# Patient Record
Sex: Female | Born: 1990 | Race: White | Hispanic: No | Marital: Single | Smoking: Never smoker
Health system: Southern US, Community
[De-identification: ages and names within clinical notes are randomized; demographics above are authoritative.]

## PROBLEM LIST (undated history)

## (undated) ENCOUNTER — Inpatient Hospital Stay (HOSPITAL_COMMUNITY): Payer: Self-pay

## (undated) ENCOUNTER — Ambulatory Visit: Admission: EM | Payer: Medicaid Other

## (undated) DIAGNOSIS — E669 Obesity, unspecified: Secondary | ICD-10-CM

## (undated) DIAGNOSIS — F32A Depression, unspecified: Secondary | ICD-10-CM

## (undated) DIAGNOSIS — F419 Anxiety disorder, unspecified: Secondary | ICD-10-CM

## (undated) DIAGNOSIS — I10 Essential (primary) hypertension: Secondary | ICD-10-CM

## (undated) DIAGNOSIS — F909 Attention-deficit hyperactivity disorder, unspecified type: Secondary | ICD-10-CM

## (undated) DIAGNOSIS — A6000 Herpesviral infection of urogenital system, unspecified: Secondary | ICD-10-CM

## (undated) DIAGNOSIS — N72 Inflammatory disease of cervix uteri: Secondary | ICD-10-CM

## (undated) DIAGNOSIS — D696 Thrombocytopenia, unspecified: Secondary | ICD-10-CM

## (undated) DIAGNOSIS — R05 Cough: Secondary | ICD-10-CM

## (undated) DIAGNOSIS — F329 Major depressive disorder, single episode, unspecified: Secondary | ICD-10-CM

## (undated) DIAGNOSIS — M7989 Other specified soft tissue disorders: Secondary | ICD-10-CM

## (undated) DIAGNOSIS — R059 Cough, unspecified: Secondary | ICD-10-CM

## (undated) DIAGNOSIS — D689 Coagulation defect, unspecified: Secondary | ICD-10-CM

## (undated) DIAGNOSIS — D693 Immune thrombocytopenic purpura: Secondary | ICD-10-CM

## (undated) DIAGNOSIS — N809 Endometriosis, unspecified: Secondary | ICD-10-CM

## (undated) HISTORY — DX: Anxiety disorder, unspecified: F41.9

## (undated) HISTORY — DX: Major depressive disorder, single episode, unspecified: F32.9

## (undated) HISTORY — DX: Immune thrombocytopenic purpura: D69.3

## (undated) HISTORY — DX: Coagulation defect, unspecified: D68.9

## (undated) HISTORY — DX: Cough: R05

## (undated) HISTORY — DX: Thrombocytopenia, unspecified: D69.6

## (undated) HISTORY — DX: Depression, unspecified: F32.A

## (undated) HISTORY — DX: Obesity, unspecified: E66.9

## (undated) HISTORY — DX: Essential (primary) hypertension: I10

## (undated) HISTORY — DX: Other specified soft tissue disorders: M79.89

## (undated) HISTORY — DX: Cough, unspecified: R05.9

## (undated) HISTORY — PX: TONSILECTOMY, ADENOIDECTOMY, BILATERAL MYRINGOTOMY AND TUBES: SHX2538

---
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
## 2010-08-04 LAB — URINE MICROSCOPIC-ADD ON

## 2010-08-04 LAB — URINALYSIS, ROUTINE W REFLEX MICROSCOPIC
Bilirubin Urine: NEGATIVE
Glucose, UA: NEGATIVE mg/dL
Ketones, ur: NEGATIVE mg/dL
Nitrite: NEGATIVE
Specific Gravity, Urine: 1.015 (ref 1.005–1.030)
Urobilinogen, UA: 0.2 mg/dL (ref 0.0–1.0)
pH: 6 (ref 5.0–8.0)

## 2010-08-04 LAB — CBC
HCT: 37.4 % (ref 36.0–49.0)
Hemoglobin: 12.8 g/dL (ref 12.0–16.0)
MCHC: 34.1 g/dL (ref 31.0–37.0)
MCV: 81.7 fL (ref 78.0–98.0)
Platelets: 157 10*3/uL (ref 150–400)
RBC: 4.58 MIL/uL (ref 3.80–5.70)
RDW: 13.9 % (ref 11.4–15.5)
WBC: 15.3 10*3/uL — ABNORMAL HIGH (ref 4.5–13.5)

## 2010-08-04 LAB — COMPREHENSIVE METABOLIC PANEL
ALT: 13 U/L (ref 0–35)
AST: 14 U/L (ref 0–37)
Albumin: 3.8 g/dL (ref 3.5–5.2)
Alkaline Phosphatase: 115 U/L (ref 47–119)
BUN: 7 mg/dL (ref 6–23)
CO2: 26 mEq/L (ref 19–32)
Calcium: 8.8 mg/dL (ref 8.4–10.5)
Chloride: 102 mEq/L (ref 96–112)
Creatinine, Ser: 0.84 mg/dL (ref 0.4–1.2)
Glucose, Bld: 125 mg/dL — ABNORMAL HIGH (ref 70–99)
Potassium: 3.9 mEq/L (ref 3.5–5.1)
Sodium: 134 mEq/L — ABNORMAL LOW (ref 135–145)
Total Bilirubin: 0.8 mg/dL (ref 0.3–1.2)
Total Protein: 6.6 g/dL (ref 6.0–8.3)

## 2010-08-04 LAB — URINE CULTURE: Colony Count: 15000

## 2010-08-04 LAB — PREGNANCY, URINE: Preg Test, Ur: NEGATIVE

## 2010-08-04 LAB — DIFFERENTIAL
Basophils Absolute: 0 10*3/uL (ref 0.0–0.1)
Basophils Relative: 0 % (ref 0–1)
Eosinophils Absolute: 0 10*3/uL (ref 0.0–1.2)
Eosinophils Relative: 0 % (ref 0–5)
Lymphocytes Relative: 4 % — ABNORMAL LOW (ref 24–48)
Lymphs Abs: 0.6 10*3/uL — ABNORMAL LOW (ref 1.1–4.8)
Monocytes Absolute: 0.5 10*3/uL (ref 0.2–1.2)
Monocytes Relative: 3 % (ref 3–11)
Neutro Abs: 14.2 10*3/uL — ABNORMAL HIGH (ref 1.7–8.0)
Neutrophils Relative %: 93 % — ABNORMAL HIGH (ref 43–71)
WBC Morphology: INCREASED

## 2010-08-04 LAB — LIPASE, BLOOD: Lipase: 12 U/L (ref 11–59)

## 2011-01-14 LAB — URINALYSIS, ROUTINE W REFLEX MICROSCOPIC
Bilirubin Urine: NEGATIVE
Glucose, UA: NEGATIVE
Hgb urine dipstick: NEGATIVE
Ketones, ur: NEGATIVE
Nitrite: NEGATIVE
Protein, ur: NEGATIVE
Specific Gravity, Urine: 1.025
Urobilinogen, UA: 0.2
pH: 5.5

## 2011-01-14 LAB — FETAL FIBRONECTIN: Fetal Fibronectin: NEGATIVE

## 2011-01-18 LAB — URINALYSIS, ROUTINE W REFLEX MICROSCOPIC
Bilirubin Urine: NEGATIVE
Glucose, UA: NEGATIVE
Hgb urine dipstick: NEGATIVE
Ketones, ur: NEGATIVE
Nitrite: NEGATIVE
Protein, ur: NEGATIVE
Specific Gravity, Urine: 1.02
Urobilinogen, UA: 0.2
pH: 6.5

## 2011-01-19 LAB — CBC
HCT: 22.4 — ABNORMAL LOW
HCT: 34.4 — ABNORMAL LOW
Hemoglobin: 11.9 — ABNORMAL LOW
Hemoglobin: 7.8 — CL
MCHC: 34.8
MCHC: 34.8
MCV: 85.5
MCV: 86.3
Platelets: 102 — ABNORMAL LOW
Platelets: 164
RBC: 2.59 — ABNORMAL LOW
RBC: 4.02
RDW: 14.3
RDW: 14.3
WBC: 10.5
WBC: 9.2

## 2011-01-19 LAB — COMPREHENSIVE METABOLIC PANEL
ALT: 12
AST: 17
Albumin: 2.7 — ABNORMAL LOW
Alkaline Phosphatase: 137 — ABNORMAL HIGH
BUN: 9
CO2: 19
Calcium: 9
Chloride: 109
Creatinine, Ser: 0.72
Glucose, Bld: 118 — ABNORMAL HIGH
Potassium: 4.2
Sodium: 137
Total Bilirubin: 0.2 — ABNORMAL LOW
Total Protein: 5.4 — ABNORMAL LOW

## 2011-01-19 LAB — LACTATE DEHYDROGENASE: LDH: 172

## 2011-01-19 LAB — URIC ACID: Uric Acid, Serum: 7.8 — ABNORMAL HIGH

## 2011-01-19 LAB — PROTEIN, URINE, RANDOM: Total Protein, Urine: 32

## 2011-01-19 LAB — RPR: RPR Ser Ql: NONREACTIVE

## 2011-01-26 LAB — PREGNANCY, URINE: Preg Test, Ur: NEGATIVE

## 2011-01-26 LAB — URINALYSIS, ROUTINE W REFLEX MICROSCOPIC
Bilirubin Urine: NEGATIVE
Glucose, UA: NEGATIVE
Leukocytes, UA: NEGATIVE
Nitrite: NEGATIVE
Specific Gravity, Urine: >1.03 — ABNORMAL HIGH
Urobilinogen, UA: 0.2
pH: 5.5

## 2011-01-26 LAB — URINE CULTURE
Colony Count: NO GROWTH
Culture: NO GROWTH

## 2011-01-26 LAB — URINE MICROSCOPIC-ADD ON

## 2011-07-30 ENCOUNTER — Emergency Department (HOSPITAL_COMMUNITY)
Admission: EM | Admit: 2011-07-30 | Discharge: 2011-07-30 | Disposition: A | Discharge status: Home / Self Care | Payer: BC Managed Care – PPO | Attending: Emergency Medicine | Admitting: Emergency Medicine

## 2011-07-30 ENCOUNTER — Emergency Department (HOSPITAL_COMMUNITY): Payer: BC Managed Care – PPO

## 2011-07-30 ENCOUNTER — Encounter (HOSPITAL_COMMUNITY): Payer: Self-pay

## 2011-07-30 DIAGNOSIS — IMO0001 Reserved for inherently not codable concepts without codable children: Secondary | ICD-10-CM | POA: Insufficient documentation

## 2011-07-30 DIAGNOSIS — J020 Streptococcal pharyngitis: Secondary | ICD-10-CM | POA: Insufficient documentation

## 2011-07-30 DIAGNOSIS — J111 Influenza due to unidentified influenza virus with other respiratory manifestations: Secondary | ICD-10-CM | POA: Insufficient documentation

## 2011-07-30 HISTORY — DX: Attention-deficit hyperactivity disorder, unspecified type: F90.9

## 2011-07-30 MED ORDER — HYDROCODONE-HOMATROPINE 5-1.5 MG/5ML PO SYRP: 5.0000 mL | ORAL_SOLUTION | Freq: Four times a day (QID) | ORAL | Status: AC | PRN

## 2011-07-30 MED ORDER — HYDROCOD POLST-CHLORPHEN POLST 10-8 MG/5ML PO LQCR
5.0000 mL | Freq: Once | ORAL | Status: AC
  Administered 2011-07-30: 5 mL via ORAL
  Filled 2011-07-30: qty 5

## 2011-07-30 MED ORDER — HYDROCOD POLST-CHLORPHEN POLST 10-8 MG/5ML PO LQCR: 5.0000 mL | Freq: Two times a day (BID) | ORAL | Status: DC

## 2011-07-30 NOTE — ED Notes (Signed)
Pt presents with fever, flu and strep throat. Pt states she was treated by PMD but her fever will not go down.

## 2011-07-30 NOTE — ED Notes (Signed)
Patient transported to X-ray 

## 2011-07-30 NOTE — Discharge Instructions (Signed)
Influenza Facts Flu (influenza) is a contagious respiratory illness caused by the influenza viruses. It can cause mild to severe illness. While most healthy people recover from the flu without specific treatment and without complications, older people, young children, and people with certain health conditions are at higher risk for serious complications from the flu, including death. CAUSES   The flu virus is spread from person to person by respiratory droplets from coughing and sneezing.   A person can also become infected by touching an object or surface with a virus on it and then touching their mouth, eye or nose.   Adults may be able to infect others from 1 day before symptoms occur and up to 7 days after getting sick. So it is possible to give someone the flu even before you know you are sick and continue to infect others while you are sick.  SYMPTOMS   Fever (usually high).   Headache.   Tiredness (can be extreme).   Cough.   Sore throat.   Runny or stuffy nose.   Body aches.   Diarrhea and vomiting may also occur, particularly in children.   These symptoms are referred to as "flu-like symptoms". A lot of different illnesses, including the common cold, can have similar symptoms.  DIAGNOSIS   There are tests that can determine if you have the flu as long you are tested within the first 2 or 3 days of illness.   A doctor's exam and additional tests may be needed to identify if you have a disease that is a complicating the flu.  RISKS AND COMPLICATIONS  Some of the complications caused by the flu include:  Bacterial pneumonia or progressive pneumonia caused by the flu virus.   Loss of body fluids (dehydration).   Worsening of chronic medical conditions, such as heart failure, asthma, or diabetes.   Sinus problems and ear infections.  HOME CARE INSTRUCTIONS   Seek medical care early on.   If you are at high risk from complications of the flu, consult your health-care  provider as soon as you develop flu-like symptoms. Those at high risk for complications include:   People 65 years or older.   People with chronic medical conditions, including diabetes.   Pregnant women.   Young children.   Your caregiver may recommend use of an antiviral medication to help treat the flu.   If you get the flu, get plenty of rest, drink a lot of liquids, and avoid using alcohol and tobacco.   You can take over-the-counter medications to relieve the symptoms of the flu if your caregiver approves. (Never give aspirin to children or teenagers who have flu-like symptoms, particularly fever).  PREVENTION  The single best way to prevent the flu is to get a flu vaccine each fall. Other measures that can help protect against the flu are:  Antiviral Medications   A number of antiviral drugs are approved for use in preventing the flu. These are prescription medications, and a doctor should be consulted before they are used.   Habits for Good Health   Cover your nose and mouth with a tissue when you cough or sneeze, throw the tissue away after you use it.   Wash your hands often with soap and water, especially after you cough or sneeze. If you are not near water, use an alcohol-based hand cleaner.   Avoid people who are sick.   If you get the flu, stay home from work or school. Avoid contact with   other people so that you do not make them sick, too.   Try not to touch your eyes, nose, or mouth as germs ore often spread this way.  IN CHILDREN, EMERGENCY WARNING SIGNS THAT NEED URGENT MEDICAL ATTENTION:  Fast breathing or trouble breathing.   Bluish skin color.   Not drinking enough fluids.   Not waking up or not interacting.   Being so irritable that the child does not want to be held.   Flu-like symptoms improve but then return with fever and worse cough.   Fever with a rash.  IN ADULTS, EMERGENCY WARNING SIGNS THAT NEED URGENT MEDICAL ATTENTION:  Difficulty  breathing or shortness of breath.   Pain or pressure in the chest or abdomen.   Sudden dizziness.   Confusion.   Severe or persistent vomiting.  SEEK IMMEDIATE MEDICAL CARE IF:  You or someone you know is experiencing any of the symptoms above. When you arrive at the emergency center,report that you think you have the flu. You may be asked to wear a mask and/or sit in a secluded area to protect others from getting sick. MAKE SURE YOU:   Understand these instructions.   Monitor your condition.   Seek medical care if you are getting worse, or not improving.  Document Released: 04/15/2003 Document Revised: 04/01/2011 Document Reviewed: 01/09/2009 [facility] Patient Information 2012 [place], [place].     Rest and make sure you are drinking plenty of fluids.  Treat her fever with Tylenol or Motrin.  You may use the prescription given for cough relief, this medicine will also cause drowsiness and will help you sleep.  Your chest x-ray is normal.  Expect gradual resolution of your symptoms over the next 3-4 days as most viral infections will last about a week.  Continue taking the antibiotic that was prescribed for you're strep infection.

## 2011-07-31 NOTE — ED Provider Notes (Signed)
History     CSN: [id number]  Arrival date & time 07/30/11  2020   First MD Initiated Contact with Patient 07/30/11 2028      Chief Complaint  Patient presents with  . Fever  . Influenza  . Sore Throat    (Consider location/radiation/quality/duration/timing/severity/associated sxs/prior treatment) HPI Comments: Patient was diagnosed with both strep throat and influenza by her PCP 4 days ago.  She is currently taking amoxicillin for her strep throat.  She continues to have fevers and feels fatigued and so presents for further evaluation.  She has had generalized achiness and fever that has been fluctuating to a high of 102.  She's also had a near constant nonproductive cough which has kept her awake, therefore is having increasing fatigue.  She denies shortness of breath and chest pain. She has been using ibuprofen and Tylenol which gives her a fleeting relief of her symptoms.   Patient is a 21 y.o. female presenting with fever. The history is provided by the patient and a parent.  Fever Primary symptoms of the febrile illness include fever, fatigue, cough and myalgias. Primary symptoms do not include headaches, shortness of breath, abdominal pain, nausea, arthralgias or rash. The current episode started 3 to 5 days ago. This is a new problem.  The myalgias are not associated with weakness.    Past Medical History  Diagnosis Date  . ADHD (attention deficit hyperactivity disorder)     History reviewed. No pertinent past surgical history.  No family history on file.  History  Substance Use Topics  . Smoking status: Never Smoker   . Smokeless tobacco: Not on file  . Alcohol Use: No    OB History    Grav Para Term Preterm Abortions TAB SAB Ect Mult Living                  Review of Systems  Constitutional: Positive for fever, chills and fatigue.  HENT: Positive for sore throat and trouble swallowing. Negative for ear pain, congestion, facial swelling, neck pain and voice  change.   Eyes: Negative.   Respiratory: Positive for cough. Negative for chest tightness and shortness of breath.   Cardiovascular: Negative for chest pain.  Gastrointestinal: Negative for nausea and abdominal pain.  Genitourinary: Negative.   Musculoskeletal: Positive for myalgias. Negative for joint swelling and arthralgias.  Skin: Negative.  Negative for rash and wound.  Neurological: Negative for dizziness, weakness, light-headedness, numbness and headaches.  Hematological: Negative.   Psychiatric/Behavioral: Negative.     Allergies  Review of patient's allergies indicates no known allergies.  Home Medications   Current Outpatient Rx  Name Route Sig Dispense Refill  . HYDROCODONE-HOMATROPINE 5-1.5 MG/5ML PO SYRP Oral Take 5 mLs by mouth every 6 (six) hours as needed for cough. 100 mL 0    BP 146/91  Pulse 100  Temp(Src) 98.6 F (37 C) (Oral)  Resp 18  Ht 5\' 4"  (1.626 m)  Wt 250 lb (113.399 kg)  BMI 42.91 kg/m2  SpO2 96%  Physical Exam  Nursing note and vitals reviewed. Constitutional: She is oriented to person, place, and time. She appears well-developed and well-nourished.  HENT:  Head: Normocephalic and atraumatic.  Right Ear: External ear normal.  Left Ear: External ear normal.  Nose: Nose normal.  Mouth/Throat: Uvula is midline and mucous membranes are normal. Posterior oropharyngeal erythema present. No oropharyngeal exudate, posterior oropharyngeal edema or tonsillar abscesses.  Eyes: Conjunctivae are normal.  Neck: Normal range of motion. No thyromegaly present.  Cardiovascular: Normal rate, regular rhythm, normal heart sounds and intact distal pulses.   Pulmonary/Chest: Effort normal and breath sounds normal. No respiratory distress. She has no decreased breath sounds. She has no wheezes. She has no rhonchi. She has no rales.  Abdominal: Soft. Bowel sounds are normal. There is no tenderness.  Musculoskeletal: Normal range of motion.  Lymphadenopathy:     She has cervical adenopathy.       Right cervical: Superficial cervical adenopathy present.       Left cervical: Superficial cervical adenopathy present.  Neurological: She is alert and oriented to person, place, and time.  Skin: Skin is warm and dry. No rash noted.  Psychiatric: She has a normal mood and affect.    ED Course  Procedures (including critical care time)  Labs Reviewed - No data to display Dg Chest 2 View  07/30/2011  *RADIOLOGY REPORT*  Clinical Data: Cough, fever, strep throat  CHEST - 2 VIEW  Comparison: 02/27/2008  Findings: Lungs are clear. No pleural effusion or pneumothorax.  Cardiomediastinal silhouette is within normal limits.  Visualized osseous structures are within normal limits.  IMPRESSION: Normal chest radiographs.  Original Report Authenticated By: Charline Bills, M.D.     1. Strep throat   2. Influenza       MDM  A chest x-ray today reviewed prior to discharge.  Reassurance given patient that her symptoms are most consistent with her known strep infection plus influenza.  Encouraged rest, fluids.  Prescribed Hycodan so patient can get better rest by suppressing her cough better.  Expect gradual improvement.        Candis Musa, PA 07/31/11 [phone]

## 2011-07-31 NOTE — ED Provider Notes (Signed)
Medical screening examination/treatment/procedure(s) were performed by non-physician practitioner and as supervising physician I was immediately available for consultation/collaboration.   Dione Booze, MD 07/31/11 2337

## 2011-09-18 ENCOUNTER — Emergency Department (HOSPITAL_COMMUNITY)
Admission: EM | Admit: 2011-09-18 | Discharge: 2011-09-18 | Disposition: A | Discharge status: Home / Self Care | Payer: BC Managed Care – PPO | Attending: Emergency Medicine | Admitting: Emergency Medicine

## 2011-09-18 ENCOUNTER — Emergency Department (HOSPITAL_COMMUNITY): Payer: BC Managed Care – PPO

## 2011-09-18 ENCOUNTER — Encounter (HOSPITAL_COMMUNITY): Payer: Self-pay | Admitting: *Deleted

## 2011-09-18 DIAGNOSIS — R071 Chest pain on breathing: Secondary | ICD-10-CM | POA: Insufficient documentation

## 2011-09-18 DIAGNOSIS — R05 Cough: Secondary | ICD-10-CM | POA: Insufficient documentation

## 2011-09-18 DIAGNOSIS — F909 Attention-deficit hyperactivity disorder, unspecified type: Secondary | ICD-10-CM | POA: Insufficient documentation

## 2011-09-18 DIAGNOSIS — R0789 Other chest pain: Secondary | ICD-10-CM

## 2011-09-18 DIAGNOSIS — R0989 Other specified symptoms and signs involving the circulatory and respiratory systems: Secondary | ICD-10-CM | POA: Insufficient documentation

## 2011-09-18 DIAGNOSIS — Z79899 Other long term (current) drug therapy: Secondary | ICD-10-CM | POA: Insufficient documentation

## 2011-09-18 DIAGNOSIS — J3489 Other specified disorders of nose and nasal sinuses: Secondary | ICD-10-CM | POA: Insufficient documentation

## 2011-09-18 DIAGNOSIS — M549 Dorsalgia, unspecified: Secondary | ICD-10-CM | POA: Insufficient documentation

## 2011-09-18 DIAGNOSIS — R059 Cough, unspecified: Secondary | ICD-10-CM | POA: Insufficient documentation

## 2011-09-18 LAB — D-DIMER, QUANTITATIVE: D-Dimer, Quant: 0.4 ug/mL-FEU (ref 0.00–0.48)

## 2011-09-18 MED ORDER — OXYCODONE-ACETAMINOPHEN 5-325 MG PO TABS: 1.0000 | ORAL_TABLET | ORAL | Status: AC | PRN

## 2011-09-18 MED ORDER — IBUPROFEN 800 MG PO TABS
800.0000 mg | ORAL_TABLET | Freq: Once | ORAL | Status: AC
  Administered 2011-09-18: 800 mg via ORAL
  Filled 2011-09-18: qty 1

## 2011-09-18 MED ORDER — OXYCODONE-ACETAMINOPHEN 5-325 MG PO TABS
1.0000 | ORAL_TABLET | Freq: Once | ORAL | Status: AC
  Administered 2011-09-18: 1 via ORAL
  Filled 2011-09-18: qty 1

## 2011-09-18 MED ORDER — IBUPROFEN 600 MG PO TABS: 600.0000 mg | ORAL_TABLET | Freq: Four times a day (QID) | ORAL | Status: AC | PRN

## 2011-09-18 NOTE — Discharge Instructions (Signed)
Chest Wall Pain Chest wall pain is pain in or around the bones and muscles of your chest. It may take up to 6 weeks to get better. It may take longer if you must stay physically active in your work and activities.  CAUSES  Chest wall pain may happen on its own. However, it may be caused by:  A viral illness like the flu.   Injury.   Coughing.   Exercise.   Arthritis.   Fibromyalgia.   Shingles.  HOME CARE INSTRUCTIONS   Avoid overtiring physical activity. Try not to strain or perform activities that cause pain. This includes any activities using your chest or your abdominal and side muscles, especially if heavy weights are used.   Put ice on the sore area.   Put ice in a plastic bag.   Place a towel between your skin and the bag.   Leave the ice on for 15 to 20 minutes per hour while awake for the first 2 days.   Only take over-the-counter or prescription medicines for pain, discomfort, or fever as directed by your caregiver.  SEEK IMMEDIATE MEDICAL CARE IF:   Your pain increases, or you are very uncomfortable.   You have a fever.   Your chest pain becomes worse.   You have new, unexplained symptoms.   You have nausea or vomiting.   You feel sweaty or lightheaded.   You have a cough with phlegm (sputum), or you cough up blood.  MAKE SURE YOU:   Understand these instructions.   Will watch your condition.   Will get help right away if you are not doing well or get worse.  Document Released: 04/12/2005 Document Revised: 04/01/2011 Document Reviewed: 12/07/2010 [facility] Patient Information 2012 [place], [place].    You may use the medicines prescribed for your pain.  Do not drive within 4 hours of taking oxycodone as this medication will make you drowsy.  A  heating pad applied to your chest for 20 minutes3-4 times daily may help this heal quicker.  Please see your Dr. for recheck if you are not improving over the next week.

## 2011-09-18 NOTE — ED Notes (Signed)
Pt c/o cough and congestion. Also c/o chest pain that is worse with coughing and deep breathing.

## 2011-09-20 ENCOUNTER — Encounter (HOSPITAL_COMMUNITY): Payer: Self-pay | Admitting: Emergency Medicine

## 2011-09-20 NOTE — ED Provider Notes (Signed)
History     CSN: [id number]  Arrival date & time 09/18/11  1858   First MD Initiated Contact with Patient 09/18/11 1927      Chief Complaint  Patient presents with  . Chest Pain    (Consider location/radiation/quality/duration/timing/severity/associated sxs/prior treatment) HPI Comments: Janice Taylor presents for evaluation of chest and upper back pain which has started over the last 24 hours and has been persistent.  She describes sharp stabbing pain which is worse with coughing and deep inspiration and palpation.  She has had a nonproductive cough for the past week and developed gradually worsening pain in her left upper chest wall which radiates into her left upper back for the past 24 hours.  She denies shortness of breath, weakness or dizziness, denies fevers or chills.  She has had mild intermittent nasal congestion without sore throat or postnasal drip.  She also denies nausea or vomiting, no abdominal painand has noted no swelling in her  extremities.  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  Patient is a 21 y.o. female presenting with chest pain. The history is provided by the patient and a parent.  Chest Pain Primary symptoms include cough. Pertinent negatives for primary symptoms include no fever, no shortness of breath, no palpitations, no abdominal pain, no nausea and no dizziness.  Pertinent negatives for associated symptoms include no numbness and no weakness.     Past Medical History  Diagnosis Date  . ADHD (attention deficit hyperactivity disorder)     History reviewed. No pertinent past surgical history.  History reviewed. No pertinent family history.  History  Substance Use Topics  . Smoking status: Never Smoker   . Smokeless tobacco: Not on file  . Alcohol Use: No    OB History    Grav Para Term Preterm Abortions TAB SAB Ect Mult Living                  Review of  Systems  Constitutional: Negative for fever.  HENT: Negative for congestion, sore throat and neck pain.   Eyes: Negative.   Respiratory: Positive for cough. Negative for chest tightness and shortness of breath.   Cardiovascular: Positive for chest pain. Negative for palpitations and leg swelling.  Gastrointestinal: Negative for nausea and abdominal pain.  Genitourinary: Negative.   Musculoskeletal: Negative for joint swelling and arthralgias.  Skin: Negative.  Negative for rash and wound.  Neurological: Negative for dizziness, weakness, light-headedness, numbness and headaches.  Hematological: Negative.   Psychiatric/Behavioral: Negative.     Allergies  Review of patient's allergies indicates no known allergies.  Home Medications   Current Outpatient Rx  Name Route Sig Dispense Refill  . LISDEXAMFETAMINE DIMESYLATE 70 MG PO CAPS Oral Take 70 mg by mouth daily.    . IBUPROFEN 600 MG PO TABS Oral Take 1 tablet (600 mg total) by mouth every 6 (six) hours as needed for pain. 20 tablet 0  . OXYCODONE-ACETAMINOPHEN 5-325 MG PO TABS Oral Take 1 tablet by mouth every 4 (four) hours as needed for pain. 15 tablet 0    BP 105/67  Pulse 76  Temp(Src) 97.7 F (36.5 C) (Oral)  Resp 20  Ht 5\' 4"  (1.626 m)  Wt 250 lb (113.399 kg)  BMI 42.91 kg/m2  SpO2 98%  Physical Exam  Nursing note and vitals reviewed. Constitutional: She appears well-developed and well-nourished. No distress.  HENT:  Head: Normocephalic and atraumatic.  Neck: Normal range of motion. Neck supple.  Cardiovascular: Normal rate, regular rhythm, normal heart sounds and intact distal pulses.   Pulses:      Radial pulses are 2+ on the right side, and 2+ on the left side.       Dorsalis pedis pulses are 2+ on the right side, and 2+ on the left side.       No peripheral edema appreciated  Pulmonary/Chest: Effort normal and breath sounds normal. She has no wheezes. She has no rales.   She exhibits tenderness.     Abdominal: Soft. Bowel sounds are normal. There is no tenderness.  Musculoskeletal: Normal range of motion.  Neurological: She is alert.  Skin: Skin is warm, dry and intact. No rash noted.  Psychiatric: She has a normal mood and affect.    ED Course  Procedures (including critical care time)   Labs Reviewed  D-DIMER, QUANTITATIVE  LAB REPORT - SCANNED   Dg Chest 2 View  09/18/2011  *RADIOLOGY REPORT*  Clinical Data: Cough.  Pleuritic chest pain.  Chest congestion.  CHEST - 2 VIEW  Comparison:  07/30/2011  Findings:  The heart size and mediastinal contours are within normal limits.  Both lungs are clear.  The visualized skeletal structures are unremarkable.  IMPRESSION: No active cardiopulmonary disease.  Original Report Authenticated By: Danae Orleans, M.D.     1. Chest wall pain     Patient was given an oxycodone tablet and ibuprofen 800 mg and she did obtain significant relief of her symptoms.  Recheck of her vital signs prior to discharge normal with no tachycardia.  MDM  Labs and x-ray reviewed.  Patient is a 21 year old female who is her negative within normal range d-dimer test today and reproducible chest pain consistent with chest wall pain. Doubt PE.  Was prescribed ibuprofen and Percocet for symptom relief and for cough relief as well.  She was also encouraged to use a heating pad to her chest and back several times daily.  Recheck by PCP if not improving over the next week.        Burgess Amor, [place] 09/20/11 [phone]

## 2011-09-23 NOTE — ED Provider Notes (Signed)
Medical screening examination/treatment/procedure(s) were performed by non-physician practitioner and as supervising physician I was immediately available for consultation/collaboration.   Wilmon Conover M Jasdeep Kepner, MD 09/23/11 0053 

## 2011-11-23 ENCOUNTER — Other Ambulatory Visit (HOSPITAL_COMMUNITY): Payer: Self-pay | Admitting: Oncology

## 2011-11-23 ENCOUNTER — Encounter (HOSPITAL_COMMUNITY): Payer: BC Managed Care – PPO | Attending: Oncology | Admitting: Oncology

## 2011-11-23 ENCOUNTER — Encounter (HOSPITAL_COMMUNITY): Payer: Self-pay | Admitting: Oncology

## 2011-11-23 ENCOUNTER — Encounter (HOSPITAL_BASED_OUTPATIENT_CLINIC_OR_DEPARTMENT_OTHER): Payer: BC Managed Care – PPO

## 2011-11-23 VITALS — BP 167/92 | HR 100 | Temp 98.5°F | Ht 64.0 in | Wt 308.1 lb

## 2011-11-23 DIAGNOSIS — D696 Thrombocytopenia, unspecified: Secondary | ICD-10-CM

## 2011-11-23 DIAGNOSIS — E669 Obesity, unspecified: Secondary | ICD-10-CM | POA: Insufficient documentation

## 2011-11-23 DIAGNOSIS — D693 Immune thrombocytopenic purpura: Secondary | ICD-10-CM

## 2011-11-23 DIAGNOSIS — Z6841 Body Mass Index (BMI) 40.0 and over, adult: Secondary | ICD-10-CM | POA: Insufficient documentation

## 2011-11-23 LAB — DIFFERENTIAL
Basophils Absolute: 0 10*3/uL (ref 0.0–0.1)
Basophils Relative: 0 % (ref 0–1)
Eosinophils Absolute: 0.1 10*3/uL (ref 0.0–0.7)
Eosinophils Relative: 1 % (ref 0–5)
Lymphocytes Relative: 36 % (ref 12–46)
Lymphs Abs: 2.5 10*3/uL (ref 0.7–4.0)
Monocytes Absolute: 0.5 10*3/uL (ref 0.1–1.0)
Monocytes Relative: 7 % (ref 3–12)
Neutro Abs: 3.8 10*3/uL (ref 1.7–7.7)
Neutrophils Relative %: 56 % (ref 43–77)

## 2011-11-23 LAB — COMPREHENSIVE METABOLIC PANEL
ALT: 27 U/L (ref 0–35)
AST: 25 U/L (ref 0–37)
Albumin: 3.8 g/dL (ref 3.5–5.2)
Alkaline Phosphatase: 82 U/L (ref 39–117)
BUN: 9 mg/dL (ref 6–23)
CO2: 24 mEq/L (ref 19–32)
Calcium: 9.3 mg/dL (ref 8.4–10.5)
Chloride: 104 mEq/L (ref 96–112)
Creatinine, Ser: 0.84 mg/dL (ref 0.50–1.10)
GFR calc Af Amer: >90 mL/min (ref >90)
GFR calc non Af Amer: >90 mL/min (ref >90)
Glucose, Bld: 97 mg/dL (ref 70–99)
Potassium: 3.6 mEq/L (ref 3.5–5.1)
Sodium: 136 mEq/L (ref 135–145)
Total Bilirubin: 0.4 mg/dL (ref 0.3–1.2)
Total Protein: 6.7 g/dL (ref 6.0–8.3)

## 2011-11-23 LAB — APTT: aPTT: 24 seconds (ref 24–37)

## 2011-11-23 LAB — IRON AND TIBC
Iron: 62 ug/dL (ref 42–135)
Saturation Ratios: 21 % (ref 20–55)
TIBC: 300 ug/dL (ref 250–470)
UIBC: 238 ug/dL (ref 125–400)

## 2011-11-23 LAB — SEDIMENTATION RATE: Sed Rate: 34 mm/hr — ABNORMAL HIGH (ref 0–22)

## 2011-11-23 LAB — CBC
HCT: 39.3 % (ref 36.0–46.0)
Hemoglobin: 13.4 g/dL (ref 12.0–15.0)
MCH: 29.7 pg (ref 26.0–34.0)
MCHC: 34.1 g/dL (ref 30.0–36.0)
MCV: 87.1 fL (ref 78.0–100.0)
Platelets: <5 10*3/uL — CL (ref 150–400)
RBC: 4.51 MIL/uL (ref 3.87–5.11)
RDW: 13.3 % (ref 11.5–15.5)
WBC: 6.9 10*3/uL (ref 4.0–10.5)

## 2011-11-23 LAB — FOLATE: Folate: 13.9 ng/mL

## 2011-11-23 LAB — PROTIME-INR
INR: 0.94 (ref 0.00–1.49)
Prothrombin Time: 12.8 seconds (ref 11.6–15.2)

## 2011-11-23 LAB — VITAMIN B12: Vitamin B-12: 482 pg/mL (ref 211–911)

## 2011-11-23 LAB — RHEUMATOID FACTOR: Rhuematoid fact SerPl-aCnc: <10 IU/mL (ref <=14)

## 2011-11-23 LAB — FERRITIN: Ferritin: 91 ng/mL (ref 10–291)

## 2011-11-23 NOTE — Patient Instructions (Signed)
Janice Taylor  [id number] December 24, 1990 Dr. Glenford Peers  [facility]  Discharge Instructions  RECOMMENDATIONS MADE BY THE CONSULTANT AND ANY TEST RESULTS WILL BE SENT TO YOUR REFERRING DOCTOR.   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 This is why you have the bruises.  MEDICATIONS PRESCRIBED: We will treat you with steroids. You are to START Dexamethasone TODAY. You will take Dexamethasone 4mg  tabs/ 5 tablets two times a day for 4 days. You will have 1 refill called to [facility]. In 28 days (on August 26) you will START this same medicine again with same directions for 4 days.   SPECIAL INSTRUCTIONS/FOLLOW-UP: We have done numerous blood test today. We will call you with any unexpected findings. We will have you come every Monday for lab work.  IT IS VERY IMPORTANT FOR YOU NOT TO DRIVE UNLESS ABSOLUTELY NECESSARY. DO NOT ENGAGE IN ANY ACTIVITY THAT YOU COULD POSSIBLY INJURE OR CUT YOURSELF. DO NOT SHAVE, BE CAREFUL USING KNIVES WHILE PREPARING FOODS, USE A SOFT TOOTH BRUSH.  IF YOU SHOULD HAVE ANY INJURY THAT CAUSES BLEEDING YOU ARE TO GO TO THE NEAREST EMERGENCY ROOM IMMEDIATELY!    I acknowledge that I have been informed and understand all the instructions given to me and received a copy. I do not have any more questions at this time, but understand that I may call the [facility] at [facility] at [phone] during business hours should I have any further questions or need assistance in obtaining follow-up care.    __________________________________________  _____________  __________ Signature of Patient or Authorized Representative            Date                   Time    __________________________________________ Nurse's Signature

## 2011-11-23 NOTE — Progress Notes (Signed)
Labs drawn today for cbc/diff,cmp,pt/ptt,ana,rf,foalte,vb12,folate,Iron and IBC

## 2011-11-23 NOTE — Progress Notes (Signed)
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 She has not had a recent viral infection though she has had a chronic cough for the last 3-5 months. She produces clear phlegm only. She gets hot at times she states but no distinct fever or shaking chills or chest pain diarrhea etc. his present. She has not had muscle aches and joint aches. She has not had night sweats. She is not losing weight nor has lost her appetite. She is not aware of adenopathy. Her grandmother accompanied her today. She has had a T. and a without bleeding and she has had tubes placed in her years when she was a child without bleeding but she did have one pregnancy labor and delivery at age 39 with what she describes as lots of blood loss until she was given shots and that stop the bleeding. She was not given blood transfusions that she is aware of. She was never told that her platelets were low in the past.  She does not smoke. She does not drink. She denies use of illicit drugs. She has been section active with 5 people and has not used protection. She presently has a Mirena device in place x2 years.  She has never been tested for HIV that she is aware of. Her exam shows vital signs to be recorded she weighs 308 pounds on a 5 foot 4 inch frame. She is afebrile. She has a pulse of 100 and regular. Respirations 1822 and unlabored at rest. She thinks she is wheezing but I did not hear any wheezes whatsoever. She had no rhonchi no rubs no rales. Her heart exam showed no murmur rub or gallop. Breast exam is negative for masses. Her skin exam showed numerous ecchymoses some as large as 4-5 cm some slightly larger but she had numerous petechiae on her abdomen she had a few on her right forearm 1 or 2 on her right foot 2  or 3 on her left anterior shin. She had ecchymoses on her forearms specimen the right. They were in various stages of development. The most obvious place for the petechiae  was her abdomen. She had no palatal petechiae no conjunctival petechiae she was alert and oriented, denied headaches EOMs were intact, facial symmetry was intact. Tongue is normal in the midline and there were no intraoral blood blisters or petechiae. Her abdomen is obese without obvious organomegaly bowel sounds were diminished. She had pulses which were 1-2+ in her feet she is right handed and there was no edema of her arms or legs. She follow all commands clearly. She was both anxious and seemed very angry and upset. She would talk about why this happened her etc.  I have looked at her peripheral blood smear which shows essentially absence of platelets but normal appearance of red cells and white cells.  Her presentation is most consistent with ITP. I have asked her to stop her doxycycline which was prescribed yesterday since I don't think she has an active bronchitis. She has not been on her via a Faylene Million for 2 or 3 months she has not been on her Ambien for 2 or 3 months and she takes her Celexa rarely. She has been on Tussionex intermittently for  the length of  time that she has had her cough and that has been about 6 or 7 bottles of this medication..  We will start her on pulse Decadron 20 mg twice a day for 4 days and repeat that in 28 days, check her blood counts weekly. I do not want her working presently nor riding in a car unless it is absolutely necessary she needs to come to the emergency room if things change and active bleeding occurs, headaches, seizures, etc. she may need a CAT scan at some point in the near future but I think we need to do the bloodwork that we started out with today and the pulse Decadron and see her back in a couple weeks. I have tried to explain this disorder to her carefully and also to her grandmother.  I have offered her second opinion at one of the [facility] if she so desires.

## 2011-11-23 NOTE — Progress Notes (Signed)
I will keep her PCP informed of our findings.

## 2011-11-24 LAB — ANA: Anti Nuclear Antibody(ANA): NEGATIVE

## 2011-11-24 LAB — HIV ANTIBODY (ROUTINE TESTING W REFLEX): HIV: NONREACTIVE

## 2011-11-25 ENCOUNTER — Telehealth (HOSPITAL_COMMUNITY): Payer: Self-pay

## 2011-11-25 ENCOUNTER — Other Ambulatory Visit (HOSPITAL_COMMUNITY): Payer: Self-pay | Admitting: Oncology

## 2011-11-25 ENCOUNTER — Encounter (HOSPITAL_COMMUNITY): Payer: Self-pay | Admitting: Oncology

## 2011-11-25 DIAGNOSIS — D696 Thrombocytopenia, unspecified: Secondary | ICD-10-CM

## 2011-11-25 DIAGNOSIS — Z862 Personal history of diseases of the blood and blood-forming organs and certain disorders involving the immune mechanism: Secondary | ICD-10-CM | POA: Insufficient documentation

## 2011-11-25 DIAGNOSIS — D693 Immune thrombocytopenic purpura: Secondary | ICD-10-CM

## 2011-11-25 HISTORY — DX: Thrombocytopenia, unspecified: D69.6

## 2011-11-25 HISTORY — DX: Immune thrombocytopenic purpura: D69.3

## 2011-11-25 LAB — HEPATITIS PANEL, ACUTE
HCV Ab: NEGATIVE
Hep A IgM: NEGATIVE
Hep B C IgM: NEGATIVE
Hepatitis B Surface Ag: NEGATIVE

## 2011-11-25 LAB — H. PYLORI ANTIBODY, IGG: H Pylori IgG: 0.46 {ISR}

## 2011-11-25 MED ORDER — ONDANSETRON HCL 8 MG PO TABS: 8.0000 mg | ORAL_TABLET | Freq: Three times a day (TID) | ORAL | Status: AC | PRN

## 2011-11-29 ENCOUNTER — Encounter (HOSPITAL_COMMUNITY): Payer: BC Managed Care – PPO | Attending: Oncology

## 2011-11-29 DIAGNOSIS — D696 Thrombocytopenia, unspecified: Secondary | ICD-10-CM | POA: Insufficient documentation

## 2011-11-29 DIAGNOSIS — D693 Immune thrombocytopenic purpura: Secondary | ICD-10-CM | POA: Insufficient documentation

## 2011-11-29 LAB — CBC
HCT: 43.4 % (ref 36.0–46.0)
Hemoglobin: 14.9 g/dL (ref 12.0–15.0)
MCH: 30 pg (ref 26.0–34.0)
MCHC: 34.3 g/dL (ref 30.0–36.0)
MCV: 87.5 fL (ref 78.0–100.0)
Platelets: 188 10*3/uL (ref 150–400)
RBC: 4.96 MIL/uL (ref 3.87–5.11)
RDW: 13.7 % (ref 11.5–15.5)
WBC: 14.5 10*3/uL — ABNORMAL HIGH (ref 4.0–10.5)

## 2011-11-29 NOTE — Progress Notes (Signed)
Labs drawn today for cbc 

## 2011-12-06 ENCOUNTER — Encounter (HOSPITAL_BASED_OUTPATIENT_CLINIC_OR_DEPARTMENT_OTHER): Payer: BC Managed Care – PPO

## 2011-12-06 DIAGNOSIS — D693 Immune thrombocytopenic purpura: Secondary | ICD-10-CM

## 2011-12-06 LAB — CBC
HCT: 41 % (ref 36.0–46.0)
Hemoglobin: 13.5 g/dL (ref 12.0–15.0)
MCH: 29.3 pg (ref 26.0–34.0)
MCHC: 32.9 g/dL (ref 30.0–36.0)
MCV: 89.1 fL (ref 78.0–100.0)
Platelets: 119 10*3/uL — ABNORMAL LOW (ref 150–400)
RBC: 4.6 MIL/uL (ref 3.87–5.11)
RDW: 13.3 % (ref 11.5–15.5)
WBC: 9.4 10*3/uL (ref 4.0–10.5)

## 2011-12-06 NOTE — Progress Notes (Signed)
Labs drawn today for cbc 

## 2011-12-13 ENCOUNTER — Encounter (HOSPITAL_BASED_OUTPATIENT_CLINIC_OR_DEPARTMENT_OTHER): Payer: BC Managed Care – PPO

## 2011-12-13 DIAGNOSIS — D693 Immune thrombocytopenic purpura: Secondary | ICD-10-CM

## 2011-12-13 LAB — CBC
HCT: 38.5 % (ref 36.0–46.0)
Hemoglobin: 12.8 g/dL (ref 12.0–15.0)
MCH: 29.5 pg (ref 26.0–34.0)
MCHC: 33.2 g/dL (ref 30.0–36.0)
MCV: 88.7 fL (ref 78.0–100.0)
Platelets: 63 10*3/uL — ABNORMAL LOW (ref 150–400)
RBC: 4.34 MIL/uL (ref 3.87–5.11)
RDW: 13.3 % (ref 11.5–15.5)
WBC: 6.1 10*3/uL (ref 4.0–10.5)

## 2011-12-13 NOTE — Progress Notes (Signed)
Labs drawn today for cbc 

## 2011-12-14 ENCOUNTER — Ambulatory Visit (HOSPITAL_COMMUNITY): Payer: BC Managed Care – PPO | Admitting: Oncology

## 2011-12-14 ENCOUNTER — Encounter (HOSPITAL_BASED_OUTPATIENT_CLINIC_OR_DEPARTMENT_OTHER): Payer: BC Managed Care – PPO | Admitting: Oncology

## 2011-12-14 VITALS — BP 142/86 | HR 94 | Temp 98.3°F | Resp 20 | Wt 311.8 lb

## 2011-12-14 DIAGNOSIS — E669 Obesity, unspecified: Secondary | ICD-10-CM

## 2011-12-14 DIAGNOSIS — Z6841 Body Mass Index (BMI) 40.0 and over, adult: Secondary | ICD-10-CM

## 2011-12-14 DIAGNOSIS — D473 Essential (hemorrhagic) thrombocythemia: Secondary | ICD-10-CM

## 2011-12-14 DIAGNOSIS — D696 Thrombocytopenia, unspecified: Secondary | ICD-10-CM

## 2011-12-14 MED ORDER — DEXAMETHASONE 4 MG PO TABS: 20.0000 mg | ORAL_TABLET | Freq: Two times a day (BID) | ORAL | Status: DC

## 2011-12-14 MED ORDER — ALPRAZOLAM 0.5 MG PO TABS: ORAL_TABLET | ORAL | Status: DC

## 2011-12-14 NOTE — Patient Instructions (Addendum)
[facility]  Discharge Instructions Janice Taylor  DOB 25-Dec-1990 CSN [id number]  MRN [id number]   RECOMMENDATIONS MADE BY THE CONSULTANT AND ANY TEST RESULTS WILL BE SENT TO YOUR REFERRING DOCTOR.   EXAM FINDINGS BY MD TODAY AND SIGNS AND SYMPTOMS TO REPORT TO CLINIC OR PRIMARY MD: Your disease is ITP- your immune system attacks your own platelets. Decadron suppresses immune system and the platelets build up. When you stop the decadron they start to fall. The spleen is the organ that attacks platelets. Sometimes the decadron stops working and we have to get spleen removed. Dr. Lovell Sheehan would be the surgeon we would use.  MEDICATIONS PRESCRIBED: start decadron next Tuesday, Tom called in.    SPECIAL INSTRUCTIONS/FOLLOW-UP:  We need to do CT scan of abdomen to check size of spleen.  I acknowledge that I have been informed and understand all the instructions given to me and received a copy. I do not have any more questions at this time, but understand that I may call the Specialty Clinic at [facility] at [phone] during business hours should I have any further questions or need assistance in obtaining follow-up care.    __________________________________________  _____________  __________ Signature of Patient or Authorized Representative            Date                   Time    __________________________________________ Nurse's Signature

## 2011-12-14 NOTE — Progress Notes (Signed)
No primary provider on file. No primary provider on file.  1. Thrombocytopenia  dexamethasone (DECADRON) 4 MG tablet, diazepam (VALIUM) 10 MG tablet, ondansetron (ZOFRAN) 8 MG tablet, CT Abdomen Wo Contrast, CBC    CURRENT THERAPY:Pulse Decadron 20 mg BID x 4 days every 28 days.  She is due to start her next pulse on 12/21/2011.  This was started on 11/23/2011 with a nice response in her platelets  INTERVAL HISTORY: Janine Ores 21 y.o. female returns for  regular  visit for followup of ITP.  She was started on pulse decadron 20 mg BID x 4 days every 28 days on 11/23/2011 at time of presentation with a nice response in her platelet count from <5 to 188.  Unfortunately, the patient's platelet count has declined from 188 to 119 to 63 most recently on 12/13/2011.  I personally reviewed and went over laboratory results with the patient.  I printed a copy of her lab work and provided it to her today.  I gave the patient some education regarding ITP.  Camella is in a much better mood today than she was on her last encounter at the [facility].  She apologized for her behavior and reports that she was scared since she was at a [employer]."  She participated in conversation appropriately.  She asked a few questions regarding the possibility of this being a life-long disorder and requiring lifelong treatment.  I informed her that it could be a lifelong issue or a transient illness requiring close follow-up.  We also discussed the next step if steroid fail which would be a splenectomy.  She has no preference of surgeon, but thinks she would like to use Dr. Lovell Sheehan (General Surgeon) because she has heard of him. We will also perform a CT of abdomen to evaluate for splenomegaly.     She will start her Decadron pulse next week , 12/21/2011 as scheduled.  She was told that her platelet count was safe.  She reports that she gets very anxious before lab work and has a phobia of blood.  Therefore, I will  give her an Rx for Xanax 0.5 mg #10 with 1 refill.  She will take this the AM before lab work only and she reports that she will have a driver so she is not driving while on the medication.  She brought up the medication valium which I have declined at this time.    Past Medical History  Diagnosis Date  . ADHD (attention deficit hyperactivity disorder)   . Depression   . Anxiety   . Thrombocytopenia 11/25/2011    Consistent with ITP    has Thrombocytopenia on her problem list.      has no known allergies.  Ms. Chichester does not currently have medications on file.  Past Surgical History  Procedure Date  . Intrauterine device insertion   . Tonsilectomy, adenoidectomy, bilateral myringotomy and tubes     Denies any headaches, dizziness, double vision, fevers, chills, night sweats, nausea, vomiting, diarrhea, constipation, chest pain, heart palpitations, shortness of breath, blood in stool, black tarry stool, urinary pain, urinary burning, urinary frequency, hematuria, rash.   PHYSICAL EXAMINATION  ECOG PERFORMANCE STATUS: 0 - Asymptomatic  Filed Vitals:   12/14/11 1128  BP: 142/86  Pulse: 94  Temp: 98.3 F (36.8 C)  Resp: 20    GENERAL:alert, no distress, well nourished, well developed, comfortable, cooperative, obese, smiling and cooperative and pleasant today SKIN: skin color, texture, turgor are normal,  no rashes or significant lesions HEAD: Normocephalic, No masses, lesions, tenderness or abnormalities EYES: normal, Conjunctiva are pink and non-injected EARS: External ears normal OROPHARYNX:lips, buccal mucosa, and tongue normal, mucous membranes are moist and no petechial rash  NECK: supple, trachea midline, thick neck LYMPH:  Difficult to assess for splenomegaly due to body habitus but I believe I palpated an enlarged spleen approximately 4 cm below the costophrenic margin. BREAST:not examined LUNGS: clear to auscultation and percussion HEART: regular rate & rhythm,  no murmurs, no gallops, S1 normal and S2 normal ABDOMEN:abdomen soft, non-tender, obese and normal bowel sounds BACK: Back symmetric, no curvature., no rash, skin stretch marks appreciated EXTREMITIES:less then 2 second capillary refill, no joint deformities, effusion, or inflammation, no skin discoloration, no clubbing, no cyanosis, positive findings:  edema 1+ LE pitting edema.  No petechial rashes appreciated  NEURO: alert & oriented x 3 with fluent speech, no focal motor/sensory deficits, gait normal    LABORATORY DATA: Results for GERIANN, LAFONT (MRN [id number]) as of 12/14/2011 11:14  Ref. Range 11/23/2011 10:34 11/23/2011 10:50 11/29/2011 11:10 12/06/2011 11:37 12/13/2011 12:25  WBC Latest Range: 4.0-10.5 K/uL 6.9  14.5 (H) 9.4 6.1  RBC Latest Range: 3.87-5.11 MIL/uL 4.51  4.96 4.60 4.34  Hemoglobin Latest Range: 12.0-15.0 g/dL 40.9  81.1 91.4 78.2  HCT Latest Range: 36.0-46.0 % 39.3  43.4 41.0 38.5  MCV Latest Range: 78.0-100.0 fL 87.1  87.5 89.1 88.7  MCH Latest Range: 26.0-34.0 pg 29.7  30.0 29.3 29.5  MCHC Latest Range: 30.0-36.0 g/dL 95.6  21.3 08.6 57.8  RDW Latest Range: 11.5-15.5 % 13.3  13.7 13.3 13.3  Platelets Latest Range: 150-400 K/uL <5 (LL)  188 119 (L) 63 (L)     ASSESSMENT:  1. Severe thrombocytopenia thus far consistent with ITP 2. Obesity with a BMI of 53   PLAN:  1. I personally reviewed and went over laboratory results with the patient. 2. CT abd without contrast to evaluate for splenomegaly. 3. Weekly lab work: CBC  4. Continue with pulse Decadron 20 mg BID x 4 days every 28 days.  She is due to start her next pulse on 12/21/2011. 5. Patient education regarding ITP, etiology, and pathophysiology. 6. Patient educated about the next line of treatment is a splenectomy.  She prefers Dr. Lovell Sheehan as a Careers adviser.  7. Will call in Xanax 0.5 mg #10 and she may take 1 tablet PO the AM before lab work. 8. Will also call in Decadron pulse regimen with 2 refills. 9. Return  in 4 weeks for follow-up.   All questions were answered. The patient knows to call the clinic with any problems, questions or concerns. We can certainly see the patient much sooner if necessary.  The patient and plan discussed with Glenford Peers, MD and he is in agreement with the aforementioned.  More than 50% of the time spent with the patient was utilized for counseling and coordination of care.  I spent 20 minutes counseling the patient face to face. The total time spent in the appointment was 25 minutes.  KEFALAS,THOMAS

## 2011-12-15 ENCOUNTER — Ambulatory Visit (HOSPITAL_COMMUNITY): Payer: BC Managed Care – PPO | Admitting: Oncology

## 2011-12-15 ENCOUNTER — Ambulatory Visit (HOSPITAL_COMMUNITY)
Admission: RE | Admit: 2011-12-15 | Discharge: 2011-12-15 | Disposition: A | Discharge status: Home / Self Care | Payer: BC Managed Care – PPO | Source: Ambulatory Visit | Attending: Oncology | Admitting: Oncology

## 2011-12-15 DIAGNOSIS — R161 Splenomegaly, not elsewhere classified: Secondary | ICD-10-CM | POA: Insufficient documentation

## 2011-12-15 DIAGNOSIS — D696 Thrombocytopenia, unspecified: Secondary | ICD-10-CM

## 2011-12-20 ENCOUNTER — Encounter (HOSPITAL_BASED_OUTPATIENT_CLINIC_OR_DEPARTMENT_OTHER): Payer: BC Managed Care – PPO

## 2011-12-20 DIAGNOSIS — D696 Thrombocytopenia, unspecified: Secondary | ICD-10-CM

## 2011-12-20 LAB — CBC
HCT: 38.6 % (ref 36.0–46.0)
Hemoglobin: 13.2 g/dL (ref 12.0–15.0)
MCH: 30.3 pg (ref 26.0–34.0)
MCHC: 34.2 g/dL (ref 30.0–36.0)
MCV: 88.5 fL (ref 78.0–100.0)
Platelets: 105 10*3/uL — ABNORMAL LOW (ref 150–400)
RBC: 4.36 MIL/uL (ref 3.87–5.11)
RDW: 13.4 % (ref 11.5–15.5)
WBC: 6.5 10*3/uL (ref 4.0–10.5)

## 2011-12-20 NOTE — Progress Notes (Signed)
Labs drawn today for cbc 

## 2011-12-28 ENCOUNTER — Encounter (HOSPITAL_COMMUNITY): Payer: BC Managed Care – PPO | Attending: Oncology

## 2011-12-28 DIAGNOSIS — D696 Thrombocytopenia, unspecified: Secondary | ICD-10-CM

## 2011-12-28 DIAGNOSIS — D473 Essential (hemorrhagic) thrombocythemia: Secondary | ICD-10-CM

## 2011-12-28 DIAGNOSIS — Z0000 Encounter for general adult medical examination without abnormal findings: Secondary | ICD-10-CM | POA: Insufficient documentation

## 2011-12-28 DIAGNOSIS — D693 Immune thrombocytopenic purpura: Secondary | ICD-10-CM | POA: Insufficient documentation

## 2011-12-28 LAB — CBC
HCT: 43.1 % (ref 36.0–46.0)
Hemoglobin: 14.8 g/dL (ref 12.0–15.0)
MCH: 30 pg (ref 26.0–34.0)
MCHC: 34.3 g/dL (ref 30.0–36.0)
MCV: 87.4 fL (ref 78.0–100.0)
Platelets: 220 10*3/uL (ref 150–400)
RBC: 4.93 MIL/uL (ref 3.87–5.11)
RDW: 13 % (ref 11.5–15.5)
WBC: 14 10*3/uL — ABNORMAL HIGH (ref 4.0–10.5)

## 2011-12-28 NOTE — Progress Notes (Signed)
Labs drawn today for cbc 

## 2012-01-03 ENCOUNTER — Other Ambulatory Visit (HOSPITAL_COMMUNITY): Payer: BC Managed Care – PPO

## 2012-01-10 ENCOUNTER — Other Ambulatory Visit (HOSPITAL_COMMUNITY): Payer: Self-pay | Admitting: Oncology

## 2012-01-10 ENCOUNTER — Telehealth (HOSPITAL_COMMUNITY): Payer: Self-pay | Admitting: *Deleted

## 2012-01-10 ENCOUNTER — Encounter (HOSPITAL_COMMUNITY): Payer: BC Managed Care – PPO

## 2012-01-10 DIAGNOSIS — D696 Thrombocytopenia, unspecified: Secondary | ICD-10-CM

## 2012-01-10 DIAGNOSIS — R059 Cough, unspecified: Secondary | ICD-10-CM

## 2012-01-10 DIAGNOSIS — R05 Cough: Secondary | ICD-10-CM

## 2012-01-10 DIAGNOSIS — R058 Other specified cough: Secondary | ICD-10-CM

## 2012-01-10 DIAGNOSIS — D693 Immune thrombocytopenic purpura: Secondary | ICD-10-CM

## 2012-01-10 LAB — CBC
HCT: 39.2 % (ref 36.0–46.0)
Hemoglobin: 13.3 g/dL (ref 12.0–15.0)
MCH: 30 pg (ref 26.0–34.0)
MCHC: 33.9 g/dL (ref 30.0–36.0)
MCV: 88.3 fL (ref 78.0–100.0)
Platelets: 114 10*3/uL — ABNORMAL LOW (ref 150–400)
RBC: 4.44 MIL/uL (ref 3.87–5.11)
RDW: 13.4 % (ref 11.5–15.5)
WBC: 7.1 10*3/uL (ref 4.0–10.5)

## 2012-01-10 MED ORDER — HYDROCOD POLST-CHLORPHEN POLST 10-8 MG/5ML PO LQCR: 5.0000 mL | Freq: Two times a day (BID) | ORAL | Status: DC

## 2012-01-10 MED ORDER — AZITHROMYCIN 250 MG PO TABS: ORAL_TABLET | ORAL | Status: DC

## 2012-01-10 NOTE — Progress Notes (Signed)
Labs drawn today for cbc 

## 2012-01-10 NOTE — Telephone Encounter (Signed)
Pt notified that platelets are 114000 and that it will be up to her dentist if he wants to do the dental work. Labs have been faxed to Dr. Jearl Klinefelter per T. Jacalyn Lefevre.

## 2012-01-13 ENCOUNTER — Emergency Department (HOSPITAL_COMMUNITY)
Admission: EM | Admit: 2012-01-13 | Discharge: 2012-01-14 | Disposition: A | Discharge status: Home / Self Care | Payer: BC Managed Care – PPO | Attending: Emergency Medicine | Admitting: Emergency Medicine

## 2012-01-13 ENCOUNTER — Emergency Department (HOSPITAL_COMMUNITY): Payer: BC Managed Care – PPO

## 2012-01-13 ENCOUNTER — Encounter (HOSPITAL_COMMUNITY): Payer: Self-pay | Admitting: *Deleted

## 2012-01-13 DIAGNOSIS — Z79899 Other long term (current) drug therapy: Secondary | ICD-10-CM | POA: Insufficient documentation

## 2012-01-13 DIAGNOSIS — R0602 Shortness of breath: Secondary | ICD-10-CM | POA: Insufficient documentation

## 2012-01-13 DIAGNOSIS — R000 Tachycardia, unspecified: Secondary | ICD-10-CM | POA: Insufficient documentation

## 2012-01-13 DIAGNOSIS — M7989 Other specified soft tissue disorders: Secondary | ICD-10-CM | POA: Insufficient documentation

## 2012-01-13 DIAGNOSIS — R609 Edema, unspecified: Secondary | ICD-10-CM | POA: Insufficient documentation

## 2012-01-13 LAB — CBC WITH DIFFERENTIAL/PLATELET
Basophils Absolute: 0 10*3/uL (ref 0.0–0.1)
Basophils Relative: 0 % (ref 0–1)
Eosinophils Absolute: 0.1 10*3/uL (ref 0.0–0.7)
Eosinophils Relative: 1 % (ref 0–5)
HCT: 38.1 % (ref 36.0–46.0)
Hemoglobin: 12.8 g/dL (ref 12.0–15.0)
Lymphocytes Relative: 31 % (ref 12–46)
Lymphs Abs: 2.2 10*3/uL (ref 0.7–4.0)
MCH: 30 pg (ref 26.0–34.0)
MCHC: 33.6 g/dL (ref 30.0–36.0)
MCV: 89.4 fL (ref 78.0–100.0)
Monocytes Absolute: 0.5 10*3/uL (ref 0.1–1.0)
Monocytes Relative: 6 % (ref 3–12)
Neutro Abs: 4.4 10*3/uL (ref 1.7–7.7)
Neutrophils Relative %: 62 % (ref 43–77)
Platelets: 128 10*3/uL — ABNORMAL LOW (ref 150–400)
RBC: 4.26 MIL/uL (ref 3.87–5.11)
RDW: 13.6 % (ref 11.5–15.5)
WBC: 7.1 10*3/uL (ref 4.0–10.5)

## 2012-01-13 LAB — BASIC METABOLIC PANEL
BUN: 9 mg/dL (ref 6–23)
CO2: 22 mEq/L (ref 19–32)
Calcium: 8.8 mg/dL (ref 8.4–10.5)
Chloride: 103 mEq/L (ref 96–112)
Creatinine, Ser: 0.89 mg/dL (ref 0.50–1.10)
GFR calc Af Amer: >90 mL/min (ref >90)
GFR calc non Af Amer: >90 mL/min (ref >90)
Glucose, Bld: 129 mg/dL — ABNORMAL HIGH (ref 70–99)
Potassium: 4.1 mEq/L (ref 3.5–5.1)
Sodium: 136 mEq/L (ref 135–145)

## 2012-01-13 LAB — APTT: aPTT: 26 seconds (ref 24–37)

## 2012-01-13 LAB — PROTIME-INR
INR: 1.1 (ref 0.00–1.49)
Prothrombin Time: 14.1 seconds (ref 11.6–15.2)

## 2012-01-13 LAB — D-DIMER, QUANTITATIVE: D-Dimer, Quant: 0.72 ug/mL-FEU — ABNORMAL HIGH (ref 0.00–0.48)

## 2012-01-13 MED ORDER — HYDROCODONE-ACETAMINOPHEN 5-325 MG PO TABS
1.0000 | ORAL_TABLET | Freq: Once | ORAL | Status: AC
  Administered 2012-01-13: 1 via ORAL
  Filled 2012-01-13: qty 1

## 2012-01-13 MED ORDER — SODIUM CHLORIDE 0.9 % IV BOLUS (SEPSIS)
1000.0000 mL | Freq: Once | INTRAVENOUS | Status: AC
  Administered 2012-01-14: 1000 mL via INTRAVENOUS

## 2012-01-13 NOTE — ED Notes (Signed)
Bilateral lower extremity swelling noted +1 pitting, pt states that her feet are very painful.

## 2012-01-13 NOTE — ED Notes (Signed)
bil leg and feet swelling since yesterday,  Thinks platelets are low.  Has large bruise to rt lower leg.Is seen at [facility] for low platelets.

## 2012-01-13 NOTE — ED Provider Notes (Signed)
History     CSN: [id number]  Arrival date & time 01/13/12  2021   First MD Initiated Contact with Patient 01/13/12 2042      Chief Complaint  Patient presents with  . Leg Swelling    (Consider location/radiation/quality/duration/timing/severity/associated sxs/prior treatment) HPI PT recently diagnosed with ITP, finished steroids a few weeks has had good response with PLT >100 the last few weeks, last checked 3 days ago at the [facility]. Pt reports she has had swelling of her bilateral lower legs since yesterday, complaining of moderate aching pain. Associated with a spontaneous bruise to R inner leg. Denies any CP, SOB. No recent travel or history of hormone treatments. She has been taking z-pak this week for URI symptoms.  Past Medical History  Diagnosis Date  . ADHD (attention deficit hyperactivity disorder)   . Depression   . Anxiety   . Thrombocytopenia 11/25/2011    Consistent with ITP    Past Surgical History  Procedure Date  . Intrauterine device insertion   . Tonsilectomy, adenoidectomy, bilateral myringotomy and tubes     Family History  Problem Relation Age of Onset  . Diabetes Maternal Uncle   . Kidney disease Maternal Uncle     renal failure/dialysis  . Heart attack Maternal Grandmother     History  Substance Use Topics  . Smoking status: Never Smoker   . Smokeless tobacco: Never Used  . Alcohol Use: No    OB History    Grav Para Term Preterm Abortions TAB SAB Ect Mult Living                  Review of Systems All other systems reviewed and are negative except as noted in HPI.   Allergies  Review of patient's allergies indicates no known allergies.  Home Medications   Current Outpatient Rx  Name Route Sig Dispense Refill  . ALPRAZOLAM 0.5 MG PO TABS  Take 1 tablet, PO the AM before lab work 10 tablet 1  . AZITHROMYCIN 250 MG PO TABS  Take as directed 6 each 0  . HYDROCOD POLST-CPM POLST ER 10-8 MG/5ML PO LQCR Oral Take 5 mLs by mouth  every 12 (twelve) hours. 100 mL 0  . DEXAMETHASONE 4 MG PO TABS Oral Take 5 tablets (20 mg total) by mouth 2 (two) times daily with a meal. 40 tablet 2  . DIAZEPAM 10 MG PO TABS      . DIPHENHYDRAMINE HCL 25 MG PO CAPS Oral Take 25 mg by mouth at bedtime as needed.    Marland Kitchen LISDEXAMFETAMINE DIMESYLATE 70 MG PO CAPS Oral Take 70 mg by mouth daily.    Marland Kitchen ONDANSETRON HCL 8 MG PO TABS        BP 155/120  Pulse 117  Temp 98.9 F (37.2 C) (Oral)  Resp 20  Ht 5\' 5"  (1.651 m)  Wt 275 lb (124.739 kg)  BMI 45.76 kg/m2  SpO2 99%  Physical Exam  Nursing note and vitals reviewed. Constitutional: She is oriented to person, place, and time. She appears well-developed and well-nourished.  HENT:  Head: Normocephalic and atraumatic.  Eyes: EOM are normal. Pupils are equal, round, and reactive to light.  Neck: Normal range of motion. Neck supple.  Cardiovascular: Normal rate, normal heart sounds and intact distal pulses.   Pulmonary/Chest: Effort normal and breath sounds normal.  Abdominal: Bowel sounds are normal. She exhibits no distension. There is no tenderness.  Musculoskeletal: Normal range of motion. She exhibits edema (LE swelling bilaterally  to knees, 4cm x 5cm bruise to R inner leg and 1cm x 2cm bruise to L inner leg). She exhibits no tenderness.  Neurological: She is alert and oriented to person, place, and time. She has normal strength. No cranial nerve deficit or sensory deficit.  Skin: Skin is warm and dry. No rash noted.  Psychiatric: She has a normal mood and affect.    ED Course  Procedures (including critical care time)  Labs Reviewed  CBC WITH DIFFERENTIAL - Abnormal; Notable for the following:    Platelets 128 (*)     All other components within normal limits  D-DIMER, QUANTITATIVE - Abnormal; Notable for the following:    D-Dimer, Quant 0.72 (*)     All other components within normal limits  PROTIME-INR  BASIC METABOLIC PANEL  APTT   No results found.   No diagnosis  found.    MDM  PT with no significant thrombocytopenia today. Concern for possible DVT but [place] not available. She has persistent tachycardia but no complaints of CP or SOB. Will send for CTA chest. If neg can come back tomorrow for doppler. Care signed out to Dr. Rhunette Croft.         Charles B. Bernette Mayers, MD 01/13/12 2205

## 2012-01-14 ENCOUNTER — Ambulatory Visit (HOSPITAL_COMMUNITY)
Admit: 2012-01-14 | Discharge: 2012-01-14 | Disposition: A | Discharge status: Home / Self Care | Payer: BC Managed Care – PPO | Attending: Emergency Medicine | Admitting: Emergency Medicine

## 2012-01-14 MED ORDER — PROMETHAZINE HCL 25 MG/ML IJ SOLN
INTRAMUSCULAR | Status: AC
  Administered 2012-01-14: 12.5 mg via INTRAVENOUS
  Filled 2012-01-14: qty 1

## 2012-01-14 MED ORDER — IOHEXOL 350 MG/ML SOLN
120.0000 mL | Freq: Once | INTRAVENOUS | Status: AC | PRN
  Administered 2012-01-14: 120 mL via INTRAVENOUS

## 2012-01-14 MED ORDER — OXYCODONE-ACETAMINOPHEN 5-325 MG PO TABS: ORAL_TABLET | ORAL | Status: DC

## 2012-01-14 MED ORDER — PROMETHAZINE HCL 25 MG/ML IJ SOLN
12.5000 mg | Freq: Once | INTRAMUSCULAR | Status: AC
  Administered 2012-01-14: 12.5 mg via INTRAVENOUS

## 2012-01-14 NOTE — ED Provider Notes (Signed)
Received pt at shift change.  21yo F, c/o bilat LE's edema since yesterday assoc with pain.  Plts per baseline.  CTA chest without PE.  Pt requesting "some pain meds" and wants to go home now.  Tachycardia improved with IVF.  Sats remain 98-100% R/A, resps easy.  Ambulates with steady gait.  Will return this morning for [place] bilat LE's to r/o DVT.  Dx testing d/w pt and family.  Questions answered.  Verb understanding, agreeable to d/c home with outpt f/u.   Laray Anger, DO 01/15/12 1906

## 2012-01-17 ENCOUNTER — Encounter (HOSPITAL_COMMUNITY): Payer: BC Managed Care – PPO

## 2012-01-17 DIAGNOSIS — D696 Thrombocytopenia, unspecified: Secondary | ICD-10-CM

## 2012-01-17 NOTE — Progress Notes (Signed)
Attempted lab draw x 1 w/o success to left AC.  Patient refused additional attempts to patient's hands - offered for another RN to attempt or for her to go to the lab for a phlebotomist to draw labs today, however patient refused.  Patient was rescheduled for tomorrow morning.

## 2012-01-18 ENCOUNTER — Other Ambulatory Visit (HOSPITAL_COMMUNITY): Payer: BC Managed Care – PPO

## 2012-01-18 ENCOUNTER — Encounter (HOSPITAL_BASED_OUTPATIENT_CLINIC_OR_DEPARTMENT_OTHER): Payer: BC Managed Care – PPO | Admitting: Oncology

## 2012-01-18 VITALS — BP 171/94 | HR 106 | Temp 98.0°F | Resp 20 | Wt 313.8 lb

## 2012-01-18 DIAGNOSIS — E669 Obesity, unspecified: Secondary | ICD-10-CM

## 2012-01-18 DIAGNOSIS — D693 Immune thrombocytopenic purpura: Secondary | ICD-10-CM

## 2012-01-18 NOTE — Progress Notes (Signed)
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 Problem #2 obesity  She has responded to dexamethasone but I suspect that the temporary response. Her platelets rise to normal but are already starting to diminished. In the emergency room last week when she was seen for a ecchymosis in her right leg that she was very concerned about her platelets were already down to 128,000. CT angio was done to rule out pulmonary embolus which was absolutely negative.  Her father is with her today and asked a host of questions which hopefully had been satisfactorily answered. I went over her entire workup with him and the patient which was quite extensive but negative for etiology.  She has no petechiae on her legs but she does have one ecchymosis at about 4 cm across very superficially located right upper leg medially which is already starting to resolve. She has not been bleeding from her nose gums etc.  I discussed the fact that she has had 2 episodes of pulse Decadron therapy. We need to see what her platelets to going forward for the next several weeks but I suspect she will fail and need splenectomy. She is not inclined to take vaccinations at this time which include pneumonia vaccine, Haemophilus influenza -type B. vaccine or the meningococcal vaccine. I told her the alternative is to take penicillin twice a day the rest of her life prophylactically.  She is actually attended to talk with Dr. Biagio Quint and would like to see him for possible bariatric surgery and she is wondering whether she could have her spleen removed and have the above surgery at the same time if the spleen needs to be removed. I told her that that would be up to Dr. Biagio Quint.  She wondered when we are going to treat her with steroids again but that would be in anticipation of splenectomy with or without IVIG in addition to the steroids. We will consult Dr. Biagio Quint check  her labs today and she will let us know about the vaccines. We'll check weekly CBCs and bring her back here in 2 weeks

## 2012-01-18 NOTE — Patient Instructions (Addendum)
[facility]  Discharge Instructions  RECOMMENDATIONS MADE BY THE CONSULTANT AND ANY TEST RESULTS WILL BE SENT TO YOUR REFERRING DOCTOR.   EXAM FINDINGS BY MD TODAY AND SIGNS AND SYMPTOMS TO REPORT TO CLINIC OR PRIMARY MD: Discussion per Dr. Mariel Sleet.  We need to check blood work weekly and will get a consult with Dr. Biagio Quint for possible bariatric surgery.  MEDICATIONS PRESCRIBED: none   INSTRUCTIONS GIVEN AND DISCUSSED:  ITP - Idiopathic Thrombocytopenic Purpura. Check on the [employer] website.    SPECIAL INSTRUCTIONS/FOLLOW-UP: Lab work Needed weekly and Return to Clinic to see PA in 2 weeks.   I acknowledge that I have been informed and understand all the instructions given to me and received a copy. I do not have any more questions at this time, but understand that I may call the Specialty Clinic at [facility] at [phone] during business hours should I have any further questions or need assistance in obtaining follow-up care.    __________________________________________  _____________  __________ Signature of Patient or Authorized Representative            Date                   Time    __________________________________________ Nurse's Signature

## 2012-01-21 ENCOUNTER — Encounter (INDEPENDENT_AMBULATORY_CARE_PROVIDER_SITE_OTHER): Payer: Self-pay | Admitting: General Surgery

## 2012-01-21 ENCOUNTER — Other Ambulatory Visit (HOSPITAL_COMMUNITY): Payer: BC Managed Care – PPO

## 2012-01-21 ENCOUNTER — Encounter (HOSPITAL_BASED_OUTPATIENT_CLINIC_OR_DEPARTMENT_OTHER): Payer: BC Managed Care – PPO

## 2012-01-21 ENCOUNTER — Other Ambulatory Visit (HOSPITAL_COMMUNITY): Payer: Self-pay | Admitting: Oncology

## 2012-01-21 ENCOUNTER — Ambulatory Visit (INDEPENDENT_AMBULATORY_CARE_PROVIDER_SITE_OTHER): Payer: BC Managed Care – PPO | Admitting: General Surgery

## 2012-01-21 VITALS — BP 126/72 | HR 70 | Temp 97.8°F | Resp 16 | Ht 65.0 in | Wt 319.4 lb

## 2012-01-21 DIAGNOSIS — D693 Immune thrombocytopenic purpura: Secondary | ICD-10-CM

## 2012-01-21 DIAGNOSIS — Z0000 Encounter for general adult medical examination without abnormal findings: Secondary | ICD-10-CM

## 2012-01-21 DIAGNOSIS — D696 Thrombocytopenia, unspecified: Secondary | ICD-10-CM

## 2012-01-21 DIAGNOSIS — Z23 Encounter for immunization: Secondary | ICD-10-CM

## 2012-01-21 LAB — CBC WITH DIFFERENTIAL/PLATELET
Basophils Absolute: 0 10*3/uL (ref 0.0–0.1)
Basophils Relative: 0 % (ref 0–1)
Eosinophils Absolute: 0.1 10*3/uL (ref 0.0–0.7)
Eosinophils Relative: 2 % (ref 0–5)
HCT: 39.2 % (ref 36.0–46.0)
Hemoglobin: 13.1 g/dL (ref 12.0–15.0)
Lymphocytes Relative: 36 % (ref 12–46)
Lymphs Abs: 1.5 10*3/uL (ref 0.7–4.0)
MCH: 30 pg (ref 26.0–34.0)
MCHC: 33.4 g/dL (ref 30.0–36.0)
MCV: 89.7 fL (ref 78.0–100.0)
Monocytes Absolute: 0.3 10*3/uL (ref 0.1–1.0)
Monocytes Relative: 6 % (ref 3–12)
Neutro Abs: 2.3 10*3/uL (ref 1.7–7.7)
Neutrophils Relative %: 56 % (ref 43–77)
Platelets: 105 10*3/uL — ABNORMAL LOW (ref 150–400)
RBC: 4.37 MIL/uL (ref 3.87–5.11)
RDW: 13.4 % (ref 11.5–15.5)
WBC: 4.2 10*3/uL (ref 4.0–10.5)

## 2012-01-21 MED ORDER — MENINGOCOCCAL VAC A,C,Y,W-135 ~~LOC~~ INJ: 0.5000 mL | INJECTION | Freq: Once | SUBCUTANEOUS | Status: DC

## 2012-01-21 MED ORDER — PNEUMOCOCCAL VAC POLYVALENT 25 MCG/0.5ML IJ INJ: 0.5000 mL | INJECTION | Freq: Once | INTRAMUSCULAR | Status: DC

## 2012-01-21 MED ORDER — PNEUMOCOCCAL VAC POLYVALENT 25 MCG/0.5ML IJ INJ
0.5000 mL | INJECTION | Freq: Once | INTRAMUSCULAR | Status: AC
  Administered 2012-01-21: 0.5 mL via INTRAMUSCULAR
  Filled 2012-01-21: qty 0.5

## 2012-01-21 MED ORDER — HAEMOPHILUS B POLYSAC CONJ VAC IM SOLR: 0.5000 mL | Freq: Once | INTRAMUSCULAR | Status: DC

## 2012-01-21 MED ORDER — HAEMOPHILUS B POLYSAC CONJ VAC IM SOLR
0.5000 mL | Freq: Once | INTRAMUSCULAR | Status: AC
  Administered 2012-01-21: 0.5 mL via INTRAMUSCULAR
  Filled 2012-01-21: qty 0.5

## 2012-01-21 NOTE — Progress Notes (Signed)
Labs drawn today for cbc/diff,vit d

## 2012-01-21 NOTE — Progress Notes (Signed)
Patient ID: Janice Taylor, female   DOB: 1991/04/08, 21 y.o.   MRN: [id number]  Chief Complaint  Patient presents with  . New Evaluation    Possible spleenectomy    HPI Janice Taylor is a 21 y.o. female.  This patient is referred by Dr. 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 This workup began in July when she says that she was at the beach and she started noticing bruising for no apparent reason. She had no other evidence of bleeding and went to have this evaluated. She was found to have essentially no platelets her CBC and she was referred to hematology for evaluation. She says that she does have easy spontaneous bruising but no other evidence of bleeding. She did have a vaginal delivery about 4 years ago which had some heavy bleeding associated with this. She also has had some swelling in both feet and legs and has had 2 rounds of steroid treatment with high-dose steroids for 4 days every 28 days apart. Her last steroid pulse was about 3-4 weeks ago. Each time, she has responded well but upon review of her platelets they really had not been less than 100 except for on one or 2 occasions.  She has also attended our bariatric surgery weight loss information session and was considering gastric bypass surgery as well for a BMI of 58. HPI  Past Medical History  Diagnosis Date  . ADHD (attention deficit hyperactivity disorder)   . Depression   . Anxiety   . 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 Clotting disorder   . Cough   . Leg swelling     Past Surgical History  Procedure Date  . Intrauterine device insertion   . Tonsilectomy, adenoidectomy, bilateral myringotomy and tubes     Family History  Problem Relation Age of Onset  . Diabetes Maternal Uncle   . Kidney disease Maternal Uncle     renal failure/dialysis  . Heart attack Maternal Grandmother     Social History History  Substance Use Topics  . Smoking status: Never Smoker   .  Smokeless tobacco: Never Used  . Alcohol Use: No    No Known Allergies  Current Outpatient Prescriptions  Medication Sig Dispense Refill  . ALPRAZolam (XANAX) 0.5 MG tablet Take 0.5 mg by mouth daily as needed. anxiety      . dexamethasone (DECADRON) 4 MG tablet Take as instructed by Dr.Neijstrom      . diphenhydrAMINE (BENADRYL) 25 mg capsule Take 25 mg by mouth at bedtime as needed.      . furosemide (LASIX) 20 MG tablet Take 20 mg by mouth daily.       . ondansetron (ZOFRAN) 8 MG tablet       . oxyCODONE-acetaminophen (PERCOCET/ROXICET) 5-325 MG per tablet 1 or 2 tabs PO q6h prn pain  20 tablet  0   No current facility-administered medications for this visit.   Facility-Administered Medications Ordered in Other Visits  Medication Dose Route Frequency Provider Last Rate Last Dose  . haemophilus B polysaccharide conjugate vaccine (ActHIB) injection 0.5 mL  0.5 mL Intramuscular Once Ellouise Newer, PA   0.5 mL at 01/21/12 1247  . pneumococcal 23 valent vaccine (PNU-IMMUNE) injection 0.5 mL  0.5 mL Intramuscular Once Ellouise Newer, PA   0.5 mL at 01/21/12 1239  . DISCONTD: haemophilus B polysaccharide conjugate vaccine (ActHIB) injection 0.5 mL  0.5 mL Intramuscular Once Ellouise Newer, PA      .  DISCONTD: meningococcal vaccine (MENOMUNE) injection 0.5 mL  0.5 mL Subcutaneous Once Ellouise Newer, PA      . DISCONTD: pneumococcal 23 valent vaccine (PNU-IMMUNE) injection 0.5 mL  0.5 mL Intramuscular Once Ellouise Newer, PA        Review of Systems Review of Systems All other review of systems negative or noncontributory except as stated in the HPI  Blood pressure 126/72, pulse 70, temperature 97.8 F (36.6 C), temperature source Temporal, resp. rate 16, height 5\' 5"  (1.651 m), weight 319 lb 6 oz (144.868 kg).  Physical Exam Physical Exam Physical Exam  Nursing note and vitals reviewed. Constitutional: She is oriented to person, place, and time. She appears well-developed  and well-nourished. No distress.  HENT:  Head: Normocephalic and atraumatic.  Mouth/Throat: No oropharyngeal exudate.  Eyes: Conjunctivae and EOM are normal. Pupils are equal, round, and reactive to light. Right eye exhibits no discharge. Left eye exhibits no discharge. No scleral icterus.  Neck: Normal range of motion. Neck supple. No tracheal deviation present.  Cardiovascular: Normal rate, regular rhythm, normal heart sounds and intact distal pulses.   Pulmonary/Chest: Effort normal and breath sounds normal. No stridor. No respiratory distress. She has no wheezes.  Abdominal: Soft. Bowel sounds are normal. She exhibits no distension and no mass. There is no tenderness. There is no rebound and no guarding.  Musculoskeletal: Normal range of motion. She exhibits no edema and no tenderness.  Neurological: She is alert and oriented to person, place, and time.  Skin: Skin is warm and dry. No rash noted. She is not diaphoretic. No erythema. No pallor.  Psychiatric: She has a normal mood and affect. Her behavior is normal. Judgment and thought content normal.    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 She is undergoing evaluation and treatment by hematology and she has had good response to previous steroid treatments but her platelets tend to taper down after her steroid treatment is complete. She had a CT scan which demonstrated mild splenomegaly which is concerning and not really consistent with this diagnosis of ITP. I have discussed her case with the physician assistant for Dr. Mariel Sleet and then going to set her up for her post splenectomy vaccinations in preparation for possible splenectomy. He will have her oncologist contact me to discuss her evaluation and treatment going forward. Given her young age, she certainly would not be a great  candidate for chronic steroid use and if this is indeed ITP, she would likely have a good response given her young age and prior response 2 steroid treatments. With regard to her obesity and possible weight loss surgery, and I think that it would be prudent to go ahead and treat her for his thrombocytopenia and a combined procedure of gastric bypass and splenectomy would not really be possible if we are planning to do this laparoscopically. If we did this open, then we could explore this possibility. I think that the best plan of action for this patient would be to perform laparoscopic splenectomy and then proceed with otoscopic Roux-en-Y gastric bypass down the road if this is indeed confirmed to be ITP.    Plan    I will wait to discuss her care with her oncologist and we will try to decide on the best timing for splenectomy if indicated.  LAYTON, BRIAN DAVID 01/21/2012, 1:20 PM

## 2012-01-21 NOTE — Progress Notes (Signed)
Tolerated both vaccines well.  Given in right and left ventrogluteal muscles.

## 2012-01-22 LAB — VITAMIN D 25 HYDROXY (VIT D DEFICIENCY, FRACTURES): Vit D, 25-Hydroxy: 28 ng/mL — ABNORMAL LOW (ref 30–89)

## 2012-01-24 ENCOUNTER — Other Ambulatory Visit (HOSPITAL_COMMUNITY): Payer: BC Managed Care – PPO

## 2012-01-25 ENCOUNTER — Telehealth (INDEPENDENT_AMBULATORY_CARE_PROVIDER_SITE_OTHER): Payer: Self-pay

## 2012-01-25 ENCOUNTER — Other Ambulatory Visit (HOSPITAL_COMMUNITY): Payer: BC Managed Care – PPO

## 2012-01-25 NOTE — Telephone Encounter (Signed)
Pt called returning Dr. Delice Lesch phone call.

## 2012-01-28 ENCOUNTER — Other Ambulatory Visit (HOSPITAL_COMMUNITY): Payer: BC Managed Care – PPO

## 2012-01-28 ENCOUNTER — Encounter (HOSPITAL_COMMUNITY): Payer: BC Managed Care – PPO | Attending: Oncology

## 2012-01-28 ENCOUNTER — Encounter (HOSPITAL_BASED_OUTPATIENT_CLINIC_OR_DEPARTMENT_OTHER): Payer: BC Managed Care – PPO

## 2012-01-28 DIAGNOSIS — Z23 Encounter for immunization: Secondary | ICD-10-CM

## 2012-01-28 DIAGNOSIS — D696 Thrombocytopenia, unspecified: Secondary | ICD-10-CM

## 2012-01-28 DIAGNOSIS — Z0000 Encounter for general adult medical examination without abnormal findings: Secondary | ICD-10-CM

## 2012-01-28 LAB — CBC
HCT: 41.9 % (ref 36.0–46.0)
Hemoglobin: 14.3 g/dL (ref 12.0–15.0)
MCH: 30.3 pg (ref 26.0–34.0)
MCHC: 34.1 g/dL (ref 30.0–36.0)
MCV: 88.8 fL (ref 78.0–100.0)
Platelets: 101 10*3/uL — ABNORMAL LOW (ref 150–400)
RBC: 4.72 MIL/uL (ref 3.87–5.11)
RDW: 12.9 % (ref 11.5–15.5)
WBC: 6.4 10*3/uL (ref 4.0–10.5)

## 2012-01-28 MED ORDER — MENINGOCOCCAL VAC A,C,Y,W-135 ~~LOC~~ INJ
0.5000 mL | INJECTION | Freq: Once | SUBCUTANEOUS | Status: AC
  Administered 2012-01-28: 0.5 mL via SUBCUTANEOUS
  Filled 2012-01-28: qty 0.5

## 2012-02-01 ENCOUNTER — Encounter (HOSPITAL_BASED_OUTPATIENT_CLINIC_OR_DEPARTMENT_OTHER): Payer: BC Managed Care – PPO | Admitting: Oncology

## 2012-02-01 ENCOUNTER — Encounter (HOSPITAL_COMMUNITY): Payer: Self-pay | Admitting: Oncology

## 2012-02-01 ENCOUNTER — Other Ambulatory Visit (HOSPITAL_COMMUNITY): Payer: BC Managed Care – PPO

## 2012-02-01 VITALS — BP 146/85 | HR 104 | Temp 98.1°F | Resp 20 | Wt 319.3 lb

## 2012-02-01 DIAGNOSIS — D696 Thrombocytopenia, unspecified: Secondary | ICD-10-CM

## 2012-02-01 DIAGNOSIS — D473 Essential (hemorrhagic) thrombocythemia: Secondary | ICD-10-CM

## 2012-02-01 DIAGNOSIS — R161 Splenomegaly, not elsewhere classified: Secondary | ICD-10-CM

## 2012-02-01 NOTE — Progress Notes (Signed)
Janice Ribas, MD [address] [place] [address]  1. Thrombocytopenia     CURRENT THERAPY: S/P 2 cycles of dexamethasone pulses  INTERVAL HISTORY: Janice Taylor 21 y.o. female returns for  regular  visit for followup of ITP with peripheral blood smear revealing absence of platelets, but normal appearing red and white blood cells.  She does have mild splenomegaly appreciated on CT scan.  She is accompanied by her father today.   Janice Taylor was given 2 pulses of dexamethasone per protocol.  Both times, her platelets responded with a decrease in her platelets near the time of initiation of the next cycle.  She was therefore referred to general surgery for consideration of splenectomy.  She was also considered for bariatric surgery.  She wished for both of these procedures to be performed in one operation and was informed by [place] and her surgeon, Dr. Biagio Quint, that these two procedure cannot be performed at the same time.  She has received her 3 vaccines in preparation for splenectomy including: pneumonia vaccine, Haemophilus influenza -type B. Vaccine, and the meningococcal vaccine for prophylaxis.  Dr. Mariel Sleet has placed a call with Dr. Biagio Quint, the patient's surgeon for logistics of splenectomy in the future and to discuss the patient's case.  In the interim, we will perform weekly CBCs.  I personally reviewed and went over laboratory results with the patient. Her platelet count is safe at this time at 101,000 on 10/4.  So we will perform weekly CBCs to monitor for any change.  Otherwise, complete ROS questioning is negative.   Past Medical History  Diagnosis Date  . ADHD (attention deficit hyperactivity disorder)   . Depression   . Anxiety   . 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 Clotting disorder   . Cough   . Leg swelling     has Thrombocytopenia on her problem list.      has no known allergies.  Ms. Pua had no medications administered  during this visit.  Past Surgical History  Procedure Date  . Intrauterine device insertion   . Tonsilectomy, adenoidectomy, bilateral myringotomy and tubes     Denies any headaches, dizziness, double vision, fevers, chills, night sweats, nausea, vomiting, diarrhea, constipation, chest pain, heart palpitations, shortness of breath, blood in stool, black tarry stool, urinary pain, urinary burning, urinary frequency, hematuria.   PHYSICAL EXAMINATION  ECOG PERFORMANCE STATUS: 0 - Asymptomatic  Filed Vitals:   02/01/12 1400  BP: 146/85  Pulse: 104  Temp: 98.1 F (36.7 C)  Resp: 20    GENERAL:alert, no distress, well nourished, well developed, comfortable, cooperative, obese and smiling SKIN: skin color, texture, turgor are normal, no rashes or significant lesions HEAD: Normocephalic, No masses, lesions, tenderness or abnormalities EYES: normal, Conjunctiva are pink and non-injected EARS: External ears normal OROPHARYNX:lips, buccal mucosa, and tongue normal, mucous membranes are moist and no petechiae  NECK: supple, trachea midline LYMPH:  not examined BREAST:not examined LUNGS: clear to auscultation  HEART: regular rate & rhythm, no murmurs, no gallops, S1 normal and S2 normal ABDOMEN:abdomen soft, non-tender, obese and normal bowel sounds BACK: Back symmetric, no curvature. EXTREMITIES:less then 2 second capillary refill, no joint deformities, effusion, or inflammation, no edema, no skin discoloration, no clubbing, no cyanosis, no petechiae  NEURO: alert & oriented x 3 with fluent speech, no focal motor/sensory deficits, gait normal   LABORATORY DATA: CBC    Component Value Date/Time   WBC 6.4 01/28/2012 1101  RBC 4.72 01/28/2012 1101   HGB 14.3 01/28/2012 1101   HCT 41.9 01/28/2012 1101   PLT 101* 01/28/2012 1101   MCV 88.8 01/28/2012 1101   MCH 30.3 01/28/2012 1101   MCHC 34.1 01/28/2012 1101   RDW 12.9 01/28/2012 1101   LYMPHSABS 1.5 01/21/2012 0844   MONOABS 0.3  01/21/2012 0844   EOSABS 0.1 01/21/2012 0844   BASOSABS 0.0 01/21/2012 0844    Lab Results  Component Value Date   PLT 101* 01/28/2012   Results for Janice, Taylor (MRN [id number]) as of 02/01/2012 15:12  Ref. Range 12/28/2011 11:55 01/10/2012 11:02 01/13/2012 20:53 01/14/2012 01:01 01/14/2012 10:22 01/14/2012 15:11 01/21/2012 08:44 01/28/2012 11:01  Platelets Latest Range: 150-400 K/uL 220 114 (L) 128 (L)    105 (L) 101 (L)     RADIOGRAPHIC STUDIES:  12/16/2011  *RADIOLOGY REPORT*  Clinical Data: Evaluate for splenomegaly  CT ABDOMEN WITHOUT CONTRAST  Technique: CT abdomen without IV contrast.  Comparison: 02/27/2008.  Findings: There are streak artifacts from the patient's large body  habitus. Lung bases are unremarkable.  Sagittal images of the spine shows no destructive bony lesions.  Unenhanced liver is unremarkable. No calcified gallstones are  noted within gallbladder. Unenhanced spleen shows no focal  abnormality. The spleen measures about 14 cm cranial caudally  The pancreas and adrenal glands are unremarkable. Kidneys are  symmetrical in size. No nephrolithiasis. No hydronephrosis or  hydroureter. No aortic aneurysm.  No calcified ureteral calculi are identified.  No aortic aneurysm. No mesenteric fluid collection. Oral contrast  material was given to the patient. No small bowel obstruction. No  colonic obstruction in visualized abdomen. No ascites or free air.  No adenopathy.  IMPRESSION:  1. Mild splenomegaly with spleen measuring 14 cm cranial caudally.  2. No hydronephrosis or hydroureter. No nephrolithiasis.  3. No small bowel obstruction.  Original Report Authenticated By: Natasha Mead, M.D.    ASSESSMENT:  1. 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  Peripheral blood smear revealed lack of platelets at time of presentation with normal appearing red and white blood cells.  She was treated with 2 doses of Dexamethasone  pulse with a nice response, but failure.    PLAN:  1. I personally reviewed and went over laboratory results with the patient. 2. I personally reviewed and went over radiographic studies with the patient. 3. Weekly CBCs 4. Phone call placed to Dr. Biagio Quint (General Surgeon) regarding the patient's case and future therapy including splenectomy.  Dr. Mariel Sleet is awaiting return telephone call.  5. Tentatively, return in 1 month for follow-up.  This may be changed pending course of treatment after discussion with surgeon.    All questions were answered. The patient knows to call the clinic with any problems, questions or concerns. We can certainly see the patient much sooner if necessary.  The patient and plan discussed with Glenford Peers, MD and he is in agreement with the aforementioned.  KEFALAS,THOMAS

## 2012-02-01 NOTE — Patient Instructions (Addendum)
[facility]  Discharge Instructions  RECOMMENDATIONS MADE BY THE CONSULTANT AND ANY TEST RESULTS WILL BE SENT TO YOUR REFERRING DOCTOR.  We will schedule you for weekly lab work. Return to clinic in 1 month to see MD.  I acknowledge that I have been informed and understand all the instructions given to me and received a copy. I do not have any more questions at this time, but understand that I may call the Specialty Clinic at [facility] at [phone] during business hours should I have any further questions or need assistance in obtaining follow-up care.    __________________________________________  _____________  __________ Signature of Patient or Authorized Representative            Date                   Time    __________________________________________ Nurse's Signature

## 2012-02-04 ENCOUNTER — Other Ambulatory Visit (HOSPITAL_COMMUNITY): Payer: BC Managed Care – PPO

## 2012-02-04 ENCOUNTER — Encounter (HOSPITAL_BASED_OUTPATIENT_CLINIC_OR_DEPARTMENT_OTHER): Payer: BC Managed Care – PPO

## 2012-02-04 DIAGNOSIS — D473 Essential (hemorrhagic) thrombocythemia: Secondary | ICD-10-CM

## 2012-02-04 DIAGNOSIS — D696 Thrombocytopenia, unspecified: Secondary | ICD-10-CM

## 2012-02-04 LAB — CBC WITH DIFFERENTIAL/PLATELET
Basophils Absolute: 0 10*3/uL (ref 0.0–0.1)
Basophils Relative: 0 % (ref 0–1)
Eosinophils Absolute: 0.1 10*3/uL (ref 0.0–0.7)
Eosinophils Relative: 1 % (ref 0–5)
HCT: 39.5 % (ref 36.0–46.0)
Hemoglobin: 13.2 g/dL (ref 12.0–15.0)
Lymphocytes Relative: 28 % (ref 12–46)
Lymphs Abs: 2.1 10*3/uL (ref 0.7–4.0)
MCH: 29.5 pg (ref 26.0–34.0)
MCHC: 33.4 g/dL (ref 30.0–36.0)
MCV: 88.4 fL (ref 78.0–100.0)
Monocytes Absolute: 0.5 10*3/uL (ref 0.1–1.0)
Monocytes Relative: 7 % (ref 3–12)
Neutro Abs: 4.7 10*3/uL (ref 1.7–7.7)
Neutrophils Relative %: 64 % (ref 43–77)
Platelets: 116 10*3/uL — ABNORMAL LOW (ref 150–400)
RBC: 4.47 MIL/uL (ref 3.87–5.11)
RDW: 12.8 % (ref 11.5–15.5)
WBC: 7.4 10*3/uL (ref 4.0–10.5)

## 2012-02-04 NOTE — Progress Notes (Signed)
Labs drawn today for cbc/diff 

## 2012-02-08 ENCOUNTER — Other Ambulatory Visit (HOSPITAL_COMMUNITY): Payer: BC Managed Care – PPO

## 2012-02-11 ENCOUNTER — Encounter (HOSPITAL_BASED_OUTPATIENT_CLINIC_OR_DEPARTMENT_OTHER): Payer: BC Managed Care – PPO

## 2012-02-11 DIAGNOSIS — D696 Thrombocytopenia, unspecified: Secondary | ICD-10-CM

## 2012-02-11 DIAGNOSIS — D473 Essential (hemorrhagic) thrombocythemia: Secondary | ICD-10-CM

## 2012-02-11 LAB — CBC WITH DIFFERENTIAL/PLATELET
Basophils Absolute: 0 10*3/uL (ref 0.0–0.1)
Basophils Relative: 0 % (ref 0–1)
Eosinophils Absolute: 0.1 10*3/uL (ref 0.0–0.7)
Eosinophils Relative: 2 % (ref 0–5)
HCT: 39.5 % (ref 36.0–46.0)
Hemoglobin: 13.3 g/dL (ref 12.0–15.0)
Lymphocytes Relative: 27 % (ref 12–46)
Lymphs Abs: 1.7 10*3/uL (ref 0.7–4.0)
MCH: 29.7 pg (ref 26.0–34.0)
MCHC: 33.7 g/dL (ref 30.0–36.0)
MCV: 88.2 fL (ref 78.0–100.0)
Monocytes Absolute: 0.5 10*3/uL (ref 0.1–1.0)
Monocytes Relative: 7 % (ref 3–12)
Neutro Abs: 4 10*3/uL (ref 1.7–7.7)
Neutrophils Relative %: 64 % (ref 43–77)
Platelets: 118 10*3/uL — ABNORMAL LOW (ref 150–400)
RBC: 4.48 MIL/uL (ref 3.87–5.11)
RDW: 12.9 % (ref 11.5–15.5)
WBC: 6.3 10*3/uL (ref 4.0–10.5)

## 2012-02-11 NOTE — Progress Notes (Signed)
Labs drawn today for cbc/diff 

## 2012-02-15 ENCOUNTER — Other Ambulatory Visit (HOSPITAL_COMMUNITY): Payer: BC Managed Care – PPO

## 2012-02-18 ENCOUNTER — Encounter (HOSPITAL_COMMUNITY): Payer: BC Managed Care – PPO

## 2012-02-21 ENCOUNTER — Other Ambulatory Visit (HOSPITAL_COMMUNITY): Payer: BC Managed Care – PPO

## 2012-02-25 ENCOUNTER — Encounter (HOSPITAL_COMMUNITY): Payer: BC Managed Care – PPO | Attending: Oncology

## 2012-02-25 DIAGNOSIS — D693 Immune thrombocytopenic purpura: Secondary | ICD-10-CM | POA: Insufficient documentation

## 2012-02-25 DIAGNOSIS — D696 Thrombocytopenia, unspecified: Secondary | ICD-10-CM | POA: Insufficient documentation

## 2012-02-27 ENCOUNTER — Emergency Department (HOSPITAL_COMMUNITY)
Admission: EM | Admit: 2012-02-27 | Discharge: 2012-02-28 | Disposition: A | Discharge status: Home / Self Care | Payer: BC Managed Care – PPO | Attending: Emergency Medicine | Admitting: Emergency Medicine

## 2012-02-27 ENCOUNTER — Encounter (HOSPITAL_COMMUNITY): Payer: Self-pay

## 2012-02-27 ENCOUNTER — Emergency Department (HOSPITAL_COMMUNITY): Payer: BC Managed Care – PPO

## 2012-02-27 DIAGNOSIS — Z79899 Other long term (current) drug therapy: Secondary | ICD-10-CM | POA: Insufficient documentation

## 2012-02-27 DIAGNOSIS — F909 Attention-deficit hyperactivity disorder, unspecified type: Secondary | ICD-10-CM | POA: Insufficient documentation

## 2012-02-27 DIAGNOSIS — D689 Coagulation defect, unspecified: Secondary | ICD-10-CM | POA: Insufficient documentation

## 2012-02-27 DIAGNOSIS — F3289 Other specified depressive episodes: Secondary | ICD-10-CM | POA: Insufficient documentation

## 2012-02-27 DIAGNOSIS — R300 Dysuria: Secondary | ICD-10-CM | POA: Insufficient documentation

## 2012-02-27 DIAGNOSIS — F329 Major depressive disorder, single episode, unspecified: Secondary | ICD-10-CM | POA: Insufficient documentation

## 2012-02-27 DIAGNOSIS — R350 Frequency of micturition: Secondary | ICD-10-CM | POA: Insufficient documentation

## 2012-02-27 DIAGNOSIS — Z862 Personal history of diseases of the blood and blood-forming organs and certain disorders involving the immune mechanism: Secondary | ICD-10-CM | POA: Insufficient documentation

## 2012-02-27 DIAGNOSIS — Z87440 Personal history of urinary (tract) infections: Secondary | ICD-10-CM | POA: Insufficient documentation

## 2012-02-27 DIAGNOSIS — F411 Generalized anxiety disorder: Secondary | ICD-10-CM | POA: Insufficient documentation

## 2012-02-27 DIAGNOSIS — N200 Calculus of kidney: Secondary | ICD-10-CM | POA: Insufficient documentation

## 2012-02-27 LAB — PREGNANCY, URINE: Preg Test, Ur: NEGATIVE

## 2012-02-27 MED ORDER — ONDANSETRON HCL 4 MG/2ML IJ SOLN
4.0000 mg | Freq: Once | INTRAMUSCULAR | Status: AC
  Administered 2012-02-27: 4 mg via INTRAVENOUS
  Filled 2012-02-27: qty 2

## 2012-02-27 MED ORDER — HYDROMORPHONE HCL PF 1 MG/ML IJ SOLN
1.0000 mg | Freq: Once | INTRAMUSCULAR | Status: AC
  Administered 2012-02-27: 1 mg via INTRAVENOUS
  Filled 2012-02-27: qty 1

## 2012-02-27 MED ORDER — KETOROLAC TROMETHAMINE 30 MG/ML IJ SOLN
30.0000 mg | Freq: Once | INTRAMUSCULAR | Status: AC
  Administered 2012-02-27: 30 mg via INTRAVENOUS
  Filled 2012-02-27: qty 1

## 2012-02-27 MED ORDER — SODIUM CHLORIDE 0.9 % IV SOLN
Freq: Once | INTRAVENOUS | Status: AC
  Administered 2012-02-27: via INTRAVENOUS

## 2012-02-27 NOTE — ED Notes (Signed)
Treating for uti with macrodantin, started having pain to right flank today, sharp in nature, nausea and vomiting.

## 2012-02-27 NOTE — ED Provider Notes (Signed)
History     CSN: [id number]  Arrival date & time 02/27/12  2318   First MD Initiated Contact with Patient 02/27/12 2333      Chief Complaint  Patient presents with  . Flank Pain    (Consider location/radiation/quality/duration/timing/severity/associated sxs/prior treatment) HPI  Janice Taylor is a 21 y.o. female with recent dx of UTI on antibiotics  who presents to the Emergency Department complaining of sudden onset right flank and side pain x 2 hours. Patient has a h/o kidney stones, last one on the left side 2 years ago. Seen by her PCP on Wednesday for urinary symptoms and begun on antibiotics. Has continued to have small amounts of urine, and increased pressure with urination. Denies fever, chills, vomiting.She has a h/o ITP being followed by Dr. Mariel Sleet.  PCP Dr. Phillips Odor Hematologist Dr. Mariel Sleet  Past Medical History  Diagnosis Date  . ADHD (attention deficit hyperactivity disorder)   . Depression   . Anxiety   . 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 Clotting disorder   . Cough   . Leg swelling     Past Surgical History  Procedure Date  . Intrauterine device insertion   . Tonsilectomy, adenoidectomy, bilateral myringotomy and tubes     Family History  Problem Relation Age of Onset  . Diabetes Maternal Uncle   . Kidney disease Maternal Uncle     renal failure/dialysis  . Heart attack Maternal Grandmother     History  Substance Use Topics  . Smoking status: Never Smoker   . Smokeless tobacco: Never Used  . Alcohol Use: No    OB History    Grav Para Term Preterm Abortions TAB SAB Ect Mult Living                  Review of Systems  Constitutional: Negative for fever.       10 Systems reviewed and are negative for acute change except as noted in the HPI.  HENT: Negative for congestion.   Eyes: Negative for discharge and redness.  Respiratory: Negative for cough and shortness of breath.   Cardiovascular: Negative for chest pain.    Gastrointestinal: Negative for vomiting and abdominal pain.  Genitourinary: Positive for frequency, flank pain and difficulty urinating.  Musculoskeletal: Negative for back pain.  Skin: Negative for rash.  Neurological: Negative for syncope, numbness and headaches.  Psychiatric/Behavioral:       No behavior change.    Allergies  Review of patient's allergies indicates no known allergies.  Home Medications   Current Outpatient Rx  Name  Route  Sig  Dispense  Refill  . ALPRAZOLAM 0.5 MG PO TABS   Oral   Take 0.5 mg by mouth daily as needed. anxiety         . FUROSEMIDE 20 MG PO TABS   Oral   Take 20 mg by mouth daily.          Marland Kitchen NITROFURANTOIN MACROCRYSTAL 100 MG PO CAPS   Oral   Take 100 mg by mouth 2 (two) times daily.         Marland Kitchen DIPHENHYDRAMINE HCL 25 MG PO CAPS   Oral   Take 25 mg by mouth at bedtime as needed.         . OXYCODONE-ACETAMINOPHEN 5-325 MG PO TABS      1 or 2 tabs PO q6h prn pain   20 tablet   0     BP 122/51  Pulse 104  Temp  98.2 F (36.8 C) (Oral)  Resp 20  Ht 5\' 5"  (1.651 m)  Wt 285 lb (129.275 kg)  BMI 47.43 kg/m2  SpO2 98%  Physical Exam  Nursing note and vitals reviewed. Constitutional: She is oriented to person, place, and time. She appears well-developed and well-nourished.       Awake, alert, nontoxic appearance.  HENT:  Head: Atraumatic.  Eyes: EOM are normal. Pupils are equal, round, and reactive to light. Right eye exhibits no discharge. Left eye exhibits no discharge.  Neck: Neck supple.  Cardiovascular: Normal heart sounds.   Pulmonary/Chest: Effort normal and breath sounds normal. She exhibits no tenderness.  Abdominal: Soft. Bowel sounds are normal. There is no tenderness. There is no rebound.  Genitourinary:       No cva tenderness  Musculoskeletal: She exhibits no tenderness.       Baseline ROM, no obvious new focal weakness.  Neurological: She is alert and oriented to person, place, and time.       Mental  status and motor strength appears baseline for patient and situation.  Skin: No rash noted.  Psychiatric: She has a normal mood and affect.    ED Course  Procedures (including critical care time)  Results for orders placed during the hospital encounter of 02/27/12  URINALYSIS, ROUTINE W REFLEX MICROSCOPIC      Component Value Range   Color, Urine ORANGE (*) YELLOW   APPearance CLEAR  CLEAR   Specific Gravity, Urine >1.030 (*) 1.005 - 1.030   pH 5.5  5.0 - 8.0   Glucose, UA 100 (*) NEGATIVE mg/dL   Hgb urine dipstick LARGE (*) NEGATIVE   Bilirubin Urine NEGATIVE  NEGATIVE   Ketones, ur TRACE (*) NEGATIVE mg/dL   Protein, ur TRACE (*) NEGATIVE mg/dL   Urobilinogen, UA 2.0 (*) 0.0 - 1.0 mg/dL   Nitrite POSITIVE (*) NEGATIVE   Leukocytes, UA NEGATIVE  NEGATIVE  PREGNANCY, URINE      Component Value Range   Preg Test, Ur NEGATIVE  NEGATIVE  URINE MICROSCOPIC-ADD ON      Component Value Range   Squamous Epithelial / LPF MANY (*) RARE   WBC, UA 0-2  <3 WBC/hpf   RBC / HPF 21-50  <3 RBC/hpf   Bacteria, UA MANY (*) RARE   Crystals CA OXALATE CRYSTALS (*) NEGATIVE   Ct Abdomen Pelvis Wo Contrast  02/28/2012  *RADIOLOGY REPORT*  Clinical Data: Right-sided flank pain, nausea and vomiting. History of intrauterine device.  CT ABDOMEN AND PELVIS WITHOUT CONTRAST  Technique:  Multidetector CT imaging of the abdomen and pelvis was performed following the standard protocol without intravenous contrast.  Comparison: CT of the abdomen and pelvis performed 12/15/2011  Findings: The visualized lung bases are clear.  The liver and spleen are unremarkable in appearance.  The gallbladder is within normal limits.  The pancreas and adrenal glands are unremarkable.  There is an apparent 2 mm stone at the base of the bladder, along the right vesicoureteral junction.  There is associated prominence of the right ureter, and minimal right-sided hydronephrosis.  A few small nonobstructing stones are noted at the  lower pole of the right kidney, measuring up to 3 mm in size.  The kidneys are otherwise unremarkable in appearance.  No perinephric stranding is seen.  No free fluid is identified.  The small bowel is unremarkable in appearance.  The stomach is within normal limits.  No acute vascular abnormalities are seen.  The appendix is normal in caliber, without  evidence for appendicitis.  The colon is unremarkable in appearance.  The bladder is largely decompressed and grossly unremarkable in appearance.  The uterus is grossly unremarkable, with an intrauterine device noted in expected position at the fundus of the uterus.  The ovaries are relatively symmetric; no suspicious adnexal masses are seen.  There is no evidence for ovarian torsion. No inguinal lymphadenopathy is seen.  No acute osseous abnormalities are identified.  IMPRESSION:  1.  Apparent 2 mm stone at the base of bladder, along the right vesicoureteral junction; this may reflect an obstructing or recently passed stone. 2.  Associated minimal right-sided hydronephrosis; few small nonobstructing stones noted at the lower pole of the right kidney, measuring up to 3 mm in size. 3.  Intrauterine device noted in expected position.   Original Report Authenticated By: Tonia Ghent, M.D.     MDM  Patient with persistent right sided pain and sudden onset of increased sudden right flank pain today. Under treatment for UTI. H/o kidney stones. UA with 21-50 RBCs. CT with 2 mm stone at ureteral vesicular junction on right and additional stone in pelvis of right kidney. Treated with analgesics, antiinflammatory and antiemetic with relief. Dx testing d/w pt and grandmother.  Questions answered.  Verb understanding, agreeable to d/c home with outpt f/u. Pt feels improved after observation and/or treatment in ED.Pt stable in ED with no significant deterioration in condition.The patient appears reasonably screened and/or stabilized for discharge and I doubt any other medical  condition or other [facility] requiring further screening, evaluation, or treatment in the ED at this time prior to discharge.  MDM Reviewed: nursing note and vitals Interpretation: labs and CT scan  Interpreted and reviewed by me.          Nicoletta Dress. Colon Branch, MD 02/28/12 [phone]

## 2012-02-28 LAB — URINALYSIS, ROUTINE W REFLEX MICROSCOPIC
Bilirubin Urine: NEGATIVE
Glucose, UA: 100 mg/dL — AB
Leukocytes, UA: NEGATIVE
Nitrite: POSITIVE — AB
Specific Gravity, Urine: >1.03 — ABNORMAL HIGH (ref 1.005–1.030)
Urobilinogen, UA: 2 mg/dL — ABNORMAL HIGH (ref 0.0–1.0)
pH: 5.5 (ref 5.0–8.0)

## 2012-02-28 LAB — URINE MICROSCOPIC-ADD ON

## 2012-02-28 MED ORDER — HYDROMORPHONE HCL PF 1 MG/ML IJ SOLN
INTRAMUSCULAR | Status: AC
  Administered 2012-02-28: 1 mg
  Filled 2012-02-28: qty 1

## 2012-02-28 MED ORDER — OXYCODONE-ACETAMINOPHEN 5-325 MG PO TABS: 2.0000 | ORAL_TABLET | ORAL | Status: DC | PRN

## 2012-02-28 MED ORDER — PROMETHAZINE HCL 25 MG PO TABS: 12.5000 mg | ORAL_TABLET | Freq: Four times a day (QID) | ORAL | Status: DC | PRN

## 2012-02-28 MED ORDER — CIPROFLOXACIN HCL 500 MG PO TABS: 250.0000 mg | ORAL_TABLET | Freq: Two times a day (BID) | ORAL | Status: DC

## 2012-02-29 ENCOUNTER — Other Ambulatory Visit (HOSPITAL_COMMUNITY): Payer: BC Managed Care – PPO

## 2012-03-03 ENCOUNTER — Encounter (HOSPITAL_COMMUNITY): Payer: BC Managed Care – PPO

## 2012-03-03 DIAGNOSIS — D696 Thrombocytopenia, unspecified: Secondary | ICD-10-CM

## 2012-03-03 LAB — CBC WITH DIFFERENTIAL/PLATELET
Basophils Absolute: 0 10*3/uL (ref 0.0–0.1)
Basophils Relative: 0 % (ref 0–1)
Eosinophils Absolute: 0.1 10*3/uL (ref 0.0–0.7)
Eosinophils Relative: 2 % (ref 0–5)
HCT: 36.5 % (ref 36.0–46.0)
Hemoglobin: 12.2 g/dL (ref 12.0–15.0)
Lymphocytes Relative: 18 % (ref 12–46)
Lymphs Abs: 1.3 10*3/uL (ref 0.7–4.0)
MCH: 29.2 pg (ref 26.0–34.0)
MCHC: 33.4 g/dL (ref 30.0–36.0)
MCV: 87.3 fL (ref 78.0–100.0)
Monocytes Absolute: 0.4 10*3/uL (ref 0.1–1.0)
Monocytes Relative: 5 % (ref 3–12)
Neutro Abs: 5.3 10*3/uL (ref 1.7–7.7)
Neutrophils Relative %: 75 % (ref 43–77)
Platelets: 114 10*3/uL — ABNORMAL LOW (ref 150–400)
RBC: 4.18 MIL/uL (ref 3.87–5.11)
RDW: 12.6 % (ref 11.5–15.5)
WBC: 7 10*3/uL (ref 4.0–10.5)

## 2012-03-06 ENCOUNTER — Ambulatory Visit (HOSPITAL_COMMUNITY): Payer: BC Managed Care – PPO | Admitting: Oncology

## 2012-03-07 ENCOUNTER — Other Ambulatory Visit (HOSPITAL_COMMUNITY): Payer: BC Managed Care – PPO

## 2012-03-10 ENCOUNTER — Encounter (HOSPITAL_BASED_OUTPATIENT_CLINIC_OR_DEPARTMENT_OTHER): Payer: BC Managed Care – PPO

## 2012-03-10 DIAGNOSIS — D473 Essential (hemorrhagic) thrombocythemia: Secondary | ICD-10-CM

## 2012-03-10 DIAGNOSIS — D696 Thrombocytopenia, unspecified: Secondary | ICD-10-CM

## 2012-03-10 LAB — CBC WITH DIFFERENTIAL/PLATELET
Basophils Absolute: 0 10*3/uL (ref 0.0–0.1)
Basophils Relative: 0 % (ref 0–1)
Eosinophils Absolute: 0.1 10*3/uL (ref 0.0–0.7)
Eosinophils Relative: 1 % (ref 0–5)
HCT: 37.9 % (ref 36.0–46.0)
Hemoglobin: 12.8 g/dL (ref 12.0–15.0)
Lymphocytes Relative: 26 % (ref 12–46)
Lymphs Abs: 1.5 10*3/uL (ref 0.7–4.0)
MCH: 29 pg (ref 26.0–34.0)
MCHC: 33.8 g/dL (ref 30.0–36.0)
MCV: 85.9 fL (ref 78.0–100.0)
Monocytes Absolute: 0.4 10*3/uL (ref 0.1–1.0)
Monocytes Relative: 6 % (ref 3–12)
Neutro Abs: 3.7 10*3/uL (ref 1.7–7.7)
Neutrophils Relative %: 66 % (ref 43–77)
Platelets: 144 10*3/uL — ABNORMAL LOW (ref 150–400)
RBC: 4.41 MIL/uL (ref 3.87–5.11)
RDW: 12.5 % (ref 11.5–15.5)
WBC: 5.6 10*3/uL (ref 4.0–10.5)

## 2012-03-10 NOTE — Progress Notes (Signed)
Labs drawn today for cbc/diff 

## 2012-03-14 ENCOUNTER — Other Ambulatory Visit (HOSPITAL_COMMUNITY): Payer: BC Managed Care – PPO

## 2012-03-17 ENCOUNTER — Encounter (HOSPITAL_BASED_OUTPATIENT_CLINIC_OR_DEPARTMENT_OTHER): Payer: BC Managed Care – PPO

## 2012-03-17 DIAGNOSIS — D473 Essential (hemorrhagic) thrombocythemia: Secondary | ICD-10-CM

## 2012-03-17 LAB — DIFFERENTIAL
Basophils Absolute: 0 10*3/uL (ref 0.0–0.1)
Basophils Relative: 0 % (ref 0–1)
Eosinophils Absolute: 0.1 10*3/uL (ref 0.0–0.7)
Eosinophils Relative: 2 % (ref 0–5)
Lymphocytes Relative: 27 % (ref 12–46)
Lymphs Abs: 1.9 10*3/uL (ref 0.7–4.0)
Monocytes Absolute: 0.4 10*3/uL (ref 0.1–1.0)
Monocytes Relative: 6 % (ref 3–12)
Neutro Abs: 4.7 10*3/uL (ref 1.7–7.7)
Neutrophils Relative %: 66 % (ref 43–77)

## 2012-03-17 LAB — CBC
HCT: 39.1 % (ref 36.0–46.0)
Hemoglobin: 13.2 g/dL (ref 12.0–15.0)
MCH: 28.9 pg (ref 26.0–34.0)
MCHC: 33.8 g/dL (ref 30.0–36.0)
MCV: 85.6 fL (ref 78.0–100.0)
Platelets: 174 10*3/uL (ref 150–400)
RBC: 4.57 MIL/uL (ref 3.87–5.11)
RDW: 12.7 % (ref 11.5–15.5)
WBC: 7.1 10*3/uL (ref 4.0–10.5)

## 2012-03-17 NOTE — Progress Notes (Signed)
Labs drawn today for cbc/diff 

## 2012-03-20 ENCOUNTER — Encounter (HOSPITAL_BASED_OUTPATIENT_CLINIC_OR_DEPARTMENT_OTHER): Payer: BC Managed Care – PPO | Admitting: Oncology

## 2012-03-20 ENCOUNTER — Encounter (HOSPITAL_COMMUNITY): Payer: Self-pay | Admitting: Oncology

## 2012-03-20 VITALS — BP 138/93 | HR 85 | Temp 98.0°F | Resp 18 | Wt 313.9 lb

## 2012-03-20 DIAGNOSIS — D693 Immune thrombocytopenic purpura: Secondary | ICD-10-CM

## 2012-03-20 NOTE — Progress Notes (Signed)
Janice Ribas, MD [address] [place] [address]  1. ITP (idiopathic thrombocytopenic purpura)     CURRENT THERAPY: Observation.  Seen by General surgeon (Dr. Biagio Quint) for splenectomy if needed in the future.  Patient received vaccinations appropriately.    INTERVAL HISTORY: Janice Taylor 21 y.o. female returns for  regular  visit for followup of ITP.  She is accompanied by her father.   She is S/P  2 cycles of Dexamethasone 20 mg BID x 4 days repeated every 28 days.  Seen by Dr. Biagio Quint (Gen Surg) for splenectomy in the future if needed. Patient received vaccinations appropriately.   Since the patient's Dexamethasone pulses, her platelets have stabilized for the time being and most recently on 03/17/2012, her platelet count was normal at 174,000.  Janice Taylor is doing well.  She denies any complaints.  She denies any bleeding.  She denies any blood in stool, black tarry stool, hematuria, vaginal spotting, and gingival bleeding.    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.  She is not happy with her line of work.  She has applied for another job at [employer].    Complete ROS questioning is negative.   Past Medical History  Diagnosis Date  . ADHD (attention deficit hyperactivity disorder)   . Depression   . Anxiety   . 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 Clotting disorder   . Cough   . Leg swelling   . 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    Consistent with ITP     has ITP (idiopathic thrombocytopenic purpura) on her problem list.      has no known allergies.  Janice Taylor had no medications administered during this visit.  Past Surgical History  Procedure Date  . Intrauterine device insertion   . Tonsilectomy, adenoidectomy, bilateral myringotomy and tubes     Denies any headaches, dizziness, double vision, fevers, chills, night sweats, nausea, vomiting, diarrhea,  constipation, chest pain, heart palpitations, shortness of breath, blood in stool, black tarry stool, urinary pain, urinary burning, urinary frequency, hematuria.   PHYSICAL EXAMINATION  ECOG PERFORMANCE STATUS: 0 - Asymptomatic  Filed Vitals:   03/20/12 1142  BP: 138/93  Pulse: 85  Temp: 98 F (36.7 C)  Resp: 18    GENERAL:alert, no distress, well nourished, well developed, comfortable, cooperative, obese and smiling SKIN: skin color, texture, turgor are normal, no rashes or significant lesions HEAD: Normocephalic, No masses, lesions, tenderness or abnormalities EYES: normal, Conjunctiva are pink and non-injected EARS: External ears normal OROPHARYNX:no exudate, no erythema, lips, buccal mucosa, and tongue normal, dentition normal and mucous membranes are moist  NECK: supple, trachea midline LYMPH:  not examined BREAST:not examined LUNGS: clear to auscultation  HEART: regular rate & rhythm, no murmurs, no gallops, S1 normal and S2 normal ABDOMEN:abdomen soft, non-tender, obese and normal bowel sounds BACK: Back symmetric, no curvature., No CVA tenderness EXTREMITIES:less then 2 second capillary refill, no joint deformities, effusion, or inflammation, no edema, no skin discoloration, no clubbing, no cyanosis, negative findings: no rash  NEURO: alert & oriented x 3 with fluent speech, no focal motor/sensory deficits, gait normal    LABORATORY DATA: CBC    Component Value Date/Time   WBC 7.1 03/17/2012 1213   RBC 4.57 03/17/2012 1213   HGB 13.2 03/17/2012 1213   HCT 39.1 03/17/2012 1213   PLT 174 03/17/2012 1213   MCV 85.6 03/17/2012 1213  MCH 28.9 03/17/2012 1213   MCHC 33.8 03/17/2012 1213   RDW 12.7 03/17/2012 1213   LYMPHSABS 1.9 03/17/2012 1213   MONOABS 0.4 03/17/2012 1213   EOSABS 0.1 03/17/2012 1213   BASOSABS 0.0 03/17/2012 1213      ASSESSMENT:  1. 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  Peripheral blood smear revealed lack of platelets at time of presentation with normal appearing red and white blood cells. She was treated with 2 doses of Dexamethasone pulses with a nice response.  Platelets have been stable.  PLAN:  1. I personally reviewed and went over laboratory results with the patient. 2. Lab work every 2 weeks x 2  3. Then, lab work every 3 weeks x 2: CBC 4. Return beginning of Feb for follow-up.    All questions were answered. The patient knows to call the clinic with any problems, questions or concerns. We can certainly see the patient much sooner if necessary.  The patient and plan discussed with Glenford Peers, MD and he is in agreement with the aforementioned.   KEFALAS,THOMAS

## 2012-03-20 NOTE — Patient Instructions (Addendum)
[facility]  Discharge Instructions  RECOMMENDATIONS MADE BY THE CONSULTANT AND ANY TEST RESULTS WILL BE SENT TO YOUR REFERRING DOCTOR.   EXAM FINDINGS BY MD TODAY AND SIGNS AND SYMPTOMS TO REPORT TO CLINIC OR PRIMARY MD: exam and discussion by PA.  Platelets are better.  MEDICATIONS PRESCRIBED: none   INSTRUCTIONS GIVEN AND DISCUSSED: Other :  Report unusual bruising or bleeding.  SPECIAL INSTRUCTIONS/FOLLOW-UP: Lab work Needed labs every 2 weeks x 2 then every 3 weeks x 2 then to see MD.    I acknowledge that I have been informed and understand all the instructions given to me and received a copy. I do not have any more questions at this time, but understand that I may call the Specialty Clinic at [facility] at [phone] during business hours should I have any further questions or need assistance in obtaining follow-up care.    __________________________________________  _____________  __________ Signature of Patient or Authorized Representative            Date                   Time    __________________________________________ Nurse's Signature

## 2012-03-31 ENCOUNTER — Encounter (HOSPITAL_COMMUNITY): Payer: BC Managed Care – PPO | Attending: Oncology

## 2012-03-31 DIAGNOSIS — D693 Immune thrombocytopenic purpura: Secondary | ICD-10-CM | POA: Insufficient documentation

## 2012-03-31 LAB — CBC
HCT: 38.5 % (ref 36.0–46.0)
Hemoglobin: 13.1 g/dL (ref 12.0–15.0)
MCH: 29.2 pg (ref 26.0–34.0)
MCHC: 34 g/dL (ref 30.0–36.0)
MCV: 85.7 fL (ref 78.0–100.0)
Platelets: 153 10*3/uL (ref 150–400)
RBC: 4.49 MIL/uL (ref 3.87–5.11)
RDW: 12.7 % (ref 11.5–15.5)
WBC: 5.9 10*3/uL (ref 4.0–10.5)

## 2012-04-14 ENCOUNTER — Encounter (HOSPITAL_BASED_OUTPATIENT_CLINIC_OR_DEPARTMENT_OTHER): Payer: BC Managed Care – PPO

## 2012-04-14 DIAGNOSIS — D693 Immune thrombocytopenic purpura: Secondary | ICD-10-CM

## 2012-04-14 LAB — CBC
HCT: 39.5 % (ref 36.0–46.0)
Hemoglobin: 13 g/dL (ref 12.0–15.0)
MCH: 28.1 pg (ref 26.0–34.0)
MCHC: 32.9 g/dL (ref 30.0–36.0)
MCV: 85.3 fL (ref 78.0–100.0)
Platelets: 139 10*3/uL — ABNORMAL LOW (ref 150–400)
RBC: 4.63 MIL/uL (ref 3.87–5.11)
RDW: 12.5 % (ref 11.5–15.5)
WBC: 5.2 10*3/uL (ref 4.0–10.5)

## 2012-04-14 NOTE — Progress Notes (Signed)
Labs drawn today for cbc 

## 2012-05-05 ENCOUNTER — Other Ambulatory Visit (HOSPITAL_COMMUNITY): Payer: BC Managed Care – PPO

## 2012-05-26 ENCOUNTER — Encounter (HOSPITAL_COMMUNITY): Payer: BC Managed Care – PPO | Attending: Oncology

## 2012-05-26 DIAGNOSIS — D693 Immune thrombocytopenic purpura: Secondary | ICD-10-CM

## 2012-05-26 LAB — CBC
HCT: 43.2 % (ref 36.0–46.0)
Hemoglobin: 14.1 g/dL (ref 12.0–15.0)
MCH: 27.6 pg (ref 26.0–34.0)
MCHC: 32.6 g/dL (ref 30.0–36.0)
MCV: 84.5 fL (ref 78.0–100.0)
Platelets: 158 10*3/uL (ref 150–400)
RBC: 5.11 MIL/uL (ref 3.87–5.11)
RDW: 13.1 % (ref 11.5–15.5)
WBC: 6.8 10*3/uL (ref 4.0–10.5)

## 2012-05-26 NOTE — Progress Notes (Signed)
Labs drawn today for cbc 

## 2012-05-30 ENCOUNTER — Encounter (HOSPITAL_COMMUNITY): Payer: BC Managed Care – PPO | Attending: Oncology | Admitting: Oncology

## 2012-05-30 VITALS — BP 148/91 | HR 109 | Temp 98.9°F | Resp 20 | Wt 303.0 lb

## 2012-05-30 DIAGNOSIS — J069 Acute upper respiratory infection, unspecified: Secondary | ICD-10-CM

## 2012-05-30 DIAGNOSIS — D693 Immune thrombocytopenic purpura: Secondary | ICD-10-CM | POA: Insufficient documentation

## 2012-05-30 NOTE — Progress Notes (Signed)
Janice Ribas, MD [address] [place] [address]  1. ITP (idiopathic thrombocytopenic purpura)  VENTOLIN HFA 108 (90 BASE) MCG/ACT inhaler, levofloxacin (LEVAQUIN) 500 MG tablet, promethazine (PHENERGAN) 25 MG tablet, topiramate (TOPAMAX) 25 MG tablet, CBC, DISCONTINUED: Oxycodone HCl 10 MG TABS, DISCONTINUED: Tamsulosin HCl (FLOMAX) 0.4 MG CAPS    CURRENT THERAPY: Observation.  Seen by General surgeon (Dr. Biagio Quint) for splenectomy if needed in the future. Patient received vaccinations appropriately.   INTERVAL HISTORY: Janice Taylor 22 y.o. female returns for  regular  visit for followup of ITP.  She is accompanied by her father today.   I personally reviewed and went over laboratory results with the patient.  Janice Taylor's labs have been very stable since completing her 2 cycles of high dose Decadron.  She has seen surgeon, Dr. Biagio Quint, in past about potential splenectomy if needed.  She has been appropriately vaccinated in preparation for this intervention.  However, observation of her labs shows stability of her platelet count.  Most recently it was normal (albeit low-normal) at 158,000.  She denies any new medications at this time.   She has a "cold."  She was treated by her PCP with a Z-Pak initially.  She was then prescribed Levaquin and she has 2 more doses of that to complete.  She reports that she has a productive cough of yellow sputum with yellow discharge from her nose upon blowing it.  She denies any fevers.  She denies a sore throat.  She is hoarse in her voice.  I have asked her to contact her PCP if she is not feeling better by the end of this week.   Hematologically, she is doing well.  She denies any bleeding. She is pleased with her lab results and her plan moving forward.   Past Medical History  Diagnosis Date  . ADHD (attention deficit hyperactivity disorder)   . Depression   . Anxiety   . 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  Clotting disorder   . Cough   . Leg swelling   . 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    Consistent with ITP     has ITP (idiopathic thrombocytopenic purpura) on her problem list.      has no known allergies.  Janice Taylor had no medications administered during this visit.  Past Surgical History  Procedure Date  . Intrauterine device insertion   . Tonsilectomy, adenoidectomy, bilateral myringotomy and tubes     Denies any headaches, dizziness, double vision, fevers, chills, night sweats, nausea, vomiting, diarrhea, constipation, chest pain, heart palpitations, shortness of breath, blood in stool, black tarry stool, urinary pain, urinary burning, urinary frequency, hematuria.   PHYSICAL EXAMINATION  ECOG PERFORMANCE STATUS: 1 - Symptomatic but completely ambulatory  Filed Vitals:   05/30/12 1203  BP: 148/91  Pulse: 109  Temp: 98.9 F (37.2 C)  Resp: 20    GENERAL:alert, no distress, well nourished, well developed, comfortable, cooperative, obese, smiling and hoarse voice SKIN: skin color, texture, turgor are normal, no rashes or significant lesions HEAD: Normocephalic, maxillary and frontal sinus tenderness on palpation. EYES: normal, Conjunctiva are pink and non-injected EARS: External ears normal OROPHARYNX:no exudate, no erythema, lips, buccal mucosa, and tongue normal and mucous membranes are moist  NECK: supple, no adenopathy, trachea midline LYMPH:  no palpable lymphadenopathy BREAST:not examined LUNGS: clear to auscultation and percussion HEART: regular rate & rhythm, no murmurs, no gallops, S1 normal and S2 normal ABDOMEN:obese BACK:  Back symmetric, no curvature. EXTREMITIES:less then 2 second capillary refill, no skin discoloration, no clubbing, no cyanosis  NEURO: alert & oriented x 3 with fluent speech, no focal motor/sensory deficits, gait normal    LABORATORY DATA: CBC    Component Value Date/Time   WBC 6.8 05/26/2012 1046   RBC 5.11  05/26/2012 1046   HGB 14.1 05/26/2012 1046   HCT 43.2 05/26/2012 1046   PLT 158 05/26/2012 1046   MCV 84.5 05/26/2012 1046   MCH 27.6 05/26/2012 1046   MCHC 32.6 05/26/2012 1046   RDW 13.1 05/26/2012 1046   LYMPHSABS 1.9 03/17/2012 1213   MONOABS 0.4 03/17/2012 1213   EOSABS 0.1 03/17/2012 1213   BASOSABS 0.0 03/17/2012 1213       ASSESSMENT:  1. 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 Peripheral blood smear revealed lack of platelets at time of presentation with normal appearing red and white blood cells. She was treated with 2 cycles of Dexamethasone pulses at 20 mg BID x 4 days every 28 days with a nice response. Platelets have been stable. She has seen surgeon, Dr. Biagio Quint, in past about potential splenectomy if needed.  She has been appropriately vaccinated in preparation for this intervention.  2. Obesity 3. URI, S/P Z-Pak and then Levaquin with 2 more doses left   PLAN:  1. I personally reviewed and went over laboratory results with the patient. 2. Labs every 4 weeks: CBC 3. Continue antibiotics as directed. 4. Patient encouraged to contact PCP if not better by end of week. 5. Return in 3 months for follow-up.  If platelets remain stable will space out lab appointments to every 6 weeks with follow-up appointment after 3 lab visits.   All questions were answered. The patient knows to call the clinic with any problems, questions or concerns. We can certainly see the patient much sooner if necessary.  The patient and plan discussed with Glenford Peers, MD and he is in agreement with the aforementioned.  KEFALAS,THOMAS

## 2012-05-30 NOTE — Patient Instructions (Addendum)
.  [facility] Discharge Instructions  RECOMMENDATIONS MADE BY THE CONSULTANT AND ANY TEST RESULTS WILL BE SENT TO YOUR REFERRING PHYSICIAN.  EXAM FINDINGS BY THE PHYSICIAN TODAY AND SIGNS OR SYMPTOMS TO REPORT TO CLINIC OR PRIMARY PHYSICIAN: Exam today per [name]   Call your PCP if not better after levaquin finished  INSTRUCTIONS GIVEN AND DISCUSSED: Labs every 4 weeks  SPECIAL INSTRUCTIONS/FOLLOW-UP: 3 months to see Janice Taylor  Thank you for choosing [name] to provide your oncology and hematology care.  To afford each patient quality time with our providers, please arrive at least 15 minutes before your scheduled appointment time.  With your help, our goal is to use those 15 minutes to complete the necessary work-up to ensure our physicians have the information they need to help with your evaluation and healthcare recommendations.    Effective January 1st, 2014, we ask that you re-schedule your appointment with our physicians should you arrive 10 or more minutes late for your appointment.  We strive to give you quality time with our providers, and arriving late affects you and other patients whose appointments are after yours.    Again, thank you for choosing [facility].  Our hope is that these requests will decrease the amount of time that you wait before being seen by our physicians.       _____________________________________________________________  Should you have questions after your visit to [facility], please contact our office at [phone] between the hours of 8:30 a.m. and 5:00 p.m.  Voicemails left after 4:30 p.m. will not be returned until the following business day.  For prescription refill requests, have your pharmacy contact our office with your prescription refill request.

## 2012-06-23 ENCOUNTER — Encounter (HOSPITAL_BASED_OUTPATIENT_CLINIC_OR_DEPARTMENT_OTHER): Payer: BC Managed Care – PPO

## 2012-06-23 DIAGNOSIS — D693 Immune thrombocytopenic purpura: Secondary | ICD-10-CM

## 2012-06-23 LAB — CBC
HCT: 43.2 % (ref 36.0–46.0)
Hemoglobin: 14.3 g/dL (ref 12.0–15.0)
MCH: 28.2 pg (ref 26.0–34.0)
MCHC: 33.1 g/dL (ref 30.0–36.0)
MCV: 85.2 fL (ref 78.0–100.0)
Platelets: 202 10*3/uL (ref 150–400)
RBC: 5.07 MIL/uL (ref 3.87–5.11)
RDW: 13.7 % (ref 11.5–15.5)
WBC: 6.4 10*3/uL (ref 4.0–10.5)

## 2012-06-23 NOTE — Progress Notes (Signed)
Labs drawn today for cbc 

## 2012-07-21 ENCOUNTER — Encounter (HOSPITAL_COMMUNITY): Payer: BC Managed Care – PPO | Attending: Oncology

## 2012-07-21 DIAGNOSIS — D693 Immune thrombocytopenic purpura: Secondary | ICD-10-CM

## 2012-07-21 LAB — CBC
HCT: 42.2 % (ref 36.0–46.0)
Hemoglobin: 14 g/dL (ref 12.0–15.0)
MCH: 27.9 pg (ref 26.0–34.0)
MCHC: 33.2 g/dL (ref 30.0–36.0)
MCV: 84.2 fL (ref 78.0–100.0)
Platelets: 192 10*3/uL (ref 150–400)
RBC: 5.01 MIL/uL (ref 3.87–5.11)
RDW: 13.4 % (ref 11.5–15.5)
WBC: 6.8 10*3/uL (ref 4.0–10.5)

## 2012-07-21 NOTE — Progress Notes (Signed)
Labs drawn today for cbc 

## 2012-08-18 ENCOUNTER — Encounter (HOSPITAL_COMMUNITY): Payer: BC Managed Care – PPO

## 2012-08-25 ENCOUNTER — Encounter (HOSPITAL_COMMUNITY): Payer: BC Managed Care – PPO | Attending: Oncology

## 2012-08-25 DIAGNOSIS — D693 Immune thrombocytopenic purpura: Secondary | ICD-10-CM

## 2012-08-25 LAB — CBC
HCT: 43.5 % (ref 36.0–46.0)
Hemoglobin: 14.8 g/dL (ref 12.0–15.0)
MCH: 28.8 pg (ref 26.0–34.0)
MCHC: 34 g/dL (ref 30.0–36.0)
MCV: 84.6 fL (ref 78.0–100.0)
Platelets: 184 10*3/uL (ref 150–400)
RBC: 5.14 MIL/uL — ABNORMAL HIGH (ref 3.87–5.11)
RDW: 12.8 % (ref 11.5–15.5)
WBC: 7.3 10*3/uL (ref 4.0–10.5)

## 2012-08-25 NOTE — Progress Notes (Signed)
Labs drawn today for cbc 

## 2012-08-29 ENCOUNTER — Encounter (HOSPITAL_COMMUNITY): Payer: Self-pay | Admitting: Oncology

## 2012-08-29 ENCOUNTER — Encounter (HOSPITAL_BASED_OUTPATIENT_CLINIC_OR_DEPARTMENT_OTHER): Payer: BC Managed Care – PPO | Admitting: Oncology

## 2012-08-29 VITALS — BP 162/93 | HR 87 | Temp 97.9°F | Resp 20 | Wt 298.1 lb

## 2012-08-29 DIAGNOSIS — D693 Immune thrombocytopenic purpura: Secondary | ICD-10-CM

## 2012-08-29 DIAGNOSIS — E669 Obesity, unspecified: Secondary | ICD-10-CM

## 2012-08-29 NOTE — Patient Instructions (Addendum)
[facility] Discharge Instructions  RECOMMENDATIONS MADE BY THE CONSULTANT AND ANY TEST RESULTS WILL BE SENT TO YOUR REFERRING PHYSICIAN.  MEDICATIONS PRESCRIBED:  none  INSTRUCTIONS GIVEN AND DISCUSSED: Labs every 6 weeks.    SPECIAL INSTRUCTIONS/FOLLOW-UP: Call clinic with rash associated with low platelets (petechial rash) or bleeding  Thank you for choosing [name] to provide your oncology and hematology care.  To afford each patient quality time with our providers, please arrive at least 15 minutes before your scheduled appointment time.  With your help, our goal is to use those 15 minutes to complete the necessary work-up to ensure our physicians have the information they need to help with your evaluation and healthcare recommendations.    Effective January 1st, 2014, we ask that you re-schedule your appointment with our physicians should you arrive 10 or more minutes late for your appointment.  We strive to give you quality time with our providers, and arriving late affects you and other patients whose appointments are after yours.    Again, thank you for choosing [facility].  Our hope is that these requests will decrease the amount of time that you wait before being seen by our physicians.       _____________________________________________________________  Should you have questions after your visit to [facility], please contact our office at [phone] between the hours of 8:30 a.m. and 5:00 p.m.  Voicemails left after 4:30 p.m. will not be returned until the following business day.  For prescription refill requests, have your pharmacy contact our office with your prescription refill request.

## 2012-08-29 NOTE — Progress Notes (Signed)
[name], MD [address] [place] [address]  ITP (idiopathic thrombocytopenic purpura) - Plan: CBC  CURRENT THERAPY: Observation. Seen by General surgeon (Dr. Biagio Quint) for splenectomy if needed in the future. Patient received vaccinations appropriately.   INTERVAL HISTORY: Janice Taylor 22 y.o. female returns for  regular  visit for followup of ITP.  Kember is doing well.  She is presently working for a [employer] in [place].  She does not like this job and she is actively looking for another job.  Porsha denies any complaints including decreased appetite, early sateity, bleeding, blood in stool, black tarry stool, hematuria, gingival bleeding, and rash.  She was educated on petechial rash and its appearance.   I personally reviewed and went over laboratory results with the patient.  Her platelets have been very stable and solid.  We reviewed her platelet counts dating back to December 2013.  Her stability allows [place] to decrease the frequency of labs.  Hematologically, she denies any complaints and ROS questioning is negative.    Past Medical History  Diagnosis Date  . ADHD (attention deficit hyperactivity disorder)   . Depression   . Anxiety   . 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 Clotting disorder   . Cough   . Leg swelling   . 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    Consistent with ITP     has ITP (idiopathic thrombocytopenic purpura) on her problem list.     has No Known Allergies.  Ms. Harries had no medications administered during this visit.  Past Surgical History  Procedure Laterality Date  . Intrauterine device insertion    . Tonsilectomy, adenoidectomy, bilateral myringotomy and tubes      Denies any headaches, dizziness, double vision, fevers, chills, night sweats, nausea, vomiting, diarrhea, constipation, chest pain, heart palpitations, shortness of breath, blood in stool, black tarry stool,  urinary pain, urinary burning, urinary frequency, hematuria.   PHYSICAL EXAMINATION  ECOG PERFORMANCE STATUS: 0 - Asymptomatic  Filed Vitals:   08/29/12 1100  BP: 162/93  Pulse: 87  Temp: 97.9 F (36.6 C)  Resp: 20    GENERAL:alert, no distress, well nourished, well developed, comfortable, cooperative, obese and smiling SKIN: skin color, texture, turgor are normal, no rashes or significant lesions HEAD: Normocephalic, No masses, lesions, tenderness or abnormalities EYES: normal, Conjunctiva are pink and non-injected EARS: External ears normal OROPHARYNX:mucous membranes are moist  NECK: supple, no adenopathy, thyroid normal size, non-tender, without nodularity, no stridor, non-tender, trachea midline LYMPH:  no palpable lymphadenopathy BREAST:not examined LUNGS: clear to auscultation and percussion HEART: regular rate & rhythm, no murmurs, no gallops, S1 normal and S2 normal ABDOMEN:abdomen soft, non-tender, obese and normal bowel sounds BACK: Back symmetric, no curvature., No CVA tenderness EXTREMITIES:less then 2 second capillary refill, no joint deformities, effusion, or inflammation, no edema, no skin discoloration, no clubbing, no cyanosis  NEURO: alert & oriented x 3 with fluent speech, no focal motor/sensory deficits, gait normal   LABORATORY DATA: CBC    Component Value Date/Time   WBC 7.3 08/25/2012 1039   RBC 5.14* 08/25/2012 1039   HGB 14.8 08/25/2012 1039   HCT 43.5 08/25/2012 1039   PLT 184 08/25/2012 1039   MCV 84.6 08/25/2012 1039   MCH 28.8 08/25/2012 1039   MCHC 34.0 08/25/2012 1039   RDW 12.8 08/25/2012 1039   LYMPHSABS 1.9 03/17/2012 1213   MONOABS 0.4 03/17/2012 1213   EOSABS 0.1 03/17/2012 1213  BASOSABS 0.0 03/17/2012 1213   Results for RAYMONA, BOSS (MRN [id number]) as of 08/29/2012 11:47  Ref. Range 04/14/2012 11:11 05/26/2012 10:46 06/23/2012 11:11 07/21/2012 09:33 08/25/2012 10:39  Platelets Latest Range: 150-400 K/uL 139 (L) 158 202 192 184       ASSESSMENT:   1. 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 Peripheral blood smear revealed lack of platelets at time of presentation with normal appearing red and white blood cells. She was treated with 2 cycles of Dexamethasone pulses at 20 mg BID x 4 days every 28 days with a nice response. Platelets have been stable. She has seen surgeon, Dr. Biagio Quint, in past about potential splenectomy if needed. She has been appropriately vaccinated in preparation for this intervention.  2. Obesity   PLAN:  1. I personally reviewed and went over laboratory results with the patient. 2. Labs every 6 weeks: CBC 3. Patient education regarding petechial rash 4. Return in 4 months for follow  All questions were answered. The patient knows to call the clinic with any problems, questions or concerns. We can certainly see the patient much sooner if necessary.  The patient and plan discussed with Glenford Peers, MD and he is in agreement with the aforementioned.  KEFALAS,THOMAS

## 2012-09-12 ENCOUNTER — Other Ambulatory Visit (HOSPITAL_COMMUNITY): Payer: Self-pay | Admitting: Oncology

## 2012-09-12 ENCOUNTER — Telehealth (HOSPITAL_COMMUNITY): Payer: Self-pay | Admitting: Oncology

## 2012-09-12 DIAGNOSIS — D693 Immune thrombocytopenic purpura: Secondary | ICD-10-CM

## 2012-09-12 NOTE — Telephone Encounter (Signed)
Labs ordered.  CBC tomorrow.

## 2012-09-13 ENCOUNTER — Other Ambulatory Visit (HOSPITAL_COMMUNITY): Payer: BC Managed Care – PPO

## 2012-10-09 ENCOUNTER — Encounter (HOSPITAL_COMMUNITY): Payer: BC Managed Care – PPO | Attending: Oncology

## 2012-10-09 DIAGNOSIS — D693 Immune thrombocytopenic purpura: Secondary | ICD-10-CM | POA: Insufficient documentation

## 2012-10-09 LAB — CBC
HCT: 42.1 % (ref 36.0–46.0)
Hemoglobin: 14 g/dL (ref 12.0–15.0)
MCH: 28.7 pg (ref 26.0–34.0)
MCHC: 33.3 g/dL (ref 30.0–36.0)
MCV: 86.4 fL (ref 78.0–100.0)
Platelets: 200 10*3/uL (ref 150–400)
RBC: 4.87 MIL/uL (ref 3.87–5.11)
RDW: 12.6 % (ref 11.5–15.5)
WBC: 7.4 10*3/uL (ref 4.0–10.5)

## 2012-10-09 NOTE — Progress Notes (Signed)
Labs drawn today for cbc 

## 2012-10-10 ENCOUNTER — Other Ambulatory Visit (HOSPITAL_COMMUNITY): Payer: BC Managed Care – PPO

## 2012-10-28 ENCOUNTER — Emergency Department (HOSPITAL_COMMUNITY)
Admission: EM | Admit: 2012-10-28 | Discharge: 2012-10-28 | Disposition: A | Discharge status: Home / Self Care | Payer: BC Managed Care – PPO | Attending: Emergency Medicine | Admitting: Emergency Medicine

## 2012-10-28 ENCOUNTER — Emergency Department (HOSPITAL_COMMUNITY): Payer: BC Managed Care – PPO

## 2012-10-28 ENCOUNTER — Encounter (HOSPITAL_COMMUNITY): Payer: Self-pay | Admitting: *Deleted

## 2012-10-28 DIAGNOSIS — R112 Nausea with vomiting, unspecified: Secondary | ICD-10-CM | POA: Insufficient documentation

## 2012-10-28 DIAGNOSIS — H538 Other visual disturbances: Secondary | ICD-10-CM | POA: Insufficient documentation

## 2012-10-28 DIAGNOSIS — Z8659 Personal history of other mental and behavioral disorders: Secondary | ICD-10-CM | POA: Insufficient documentation

## 2012-10-28 DIAGNOSIS — R0602 Shortness of breath: Secondary | ICD-10-CM | POA: Insufficient documentation

## 2012-10-28 DIAGNOSIS — R519 Headache, unspecified: Secondary | ICD-10-CM

## 2012-10-28 DIAGNOSIS — R6883 Chills (without fever): Secondary | ICD-10-CM | POA: Insufficient documentation

## 2012-10-28 DIAGNOSIS — Z862 Personal history of diseases of the blood and blood-forming organs and certain disorders involving the immune mechanism: Secondary | ICD-10-CM | POA: Insufficient documentation

## 2012-10-28 DIAGNOSIS — R51 Headache: Secondary | ICD-10-CM | POA: Insufficient documentation

## 2012-10-28 LAB — CBC WITH DIFFERENTIAL/PLATELET
Basophils Absolute: 0 10*3/uL (ref 0.0–0.1)
Basophils Relative: 0 % (ref 0–1)
Eosinophils Absolute: 0.1 10*3/uL (ref 0.0–0.7)
Eosinophils Relative: 1 % (ref 0–5)
HCT: 40.7 % (ref 36.0–46.0)
Hemoglobin: 13.7 g/dL (ref 12.0–15.0)
Lymphocytes Relative: 28 % (ref 12–46)
Lymphs Abs: 2.4 10*3/uL (ref 0.7–4.0)
MCH: 29.1 pg (ref 26.0–34.0)
MCHC: 33.7 g/dL (ref 30.0–36.0)
MCV: 86.4 fL (ref 78.0–100.0)
Monocytes Absolute: 0.6 10*3/uL (ref 0.1–1.0)
Monocytes Relative: 7 % (ref 3–12)
Neutro Abs: 5.3 10*3/uL (ref 1.7–7.7)
Neutrophils Relative %: 64 % (ref 43–77)
Platelets: 179 10*3/uL (ref 150–400)
RBC: 4.71 MIL/uL (ref 3.87–5.11)
RDW: 12.6 % (ref 11.5–15.5)
WBC: 8.3 10*3/uL (ref 4.0–10.5)

## 2012-10-28 LAB — BASIC METABOLIC PANEL
BUN: 11 mg/dL (ref 6–23)
CO2: 28 mEq/L (ref 19–32)
Calcium: 9.5 mg/dL (ref 8.4–10.5)
Chloride: 97 mEq/L (ref 96–112)
Creatinine, Ser: 0.79 mg/dL (ref 0.50–1.10)
GFR calc Af Amer: >90 mL/min (ref >90)
GFR calc non Af Amer: >90 mL/min (ref >90)
Glucose, Bld: 82 mg/dL (ref 70–99)
Potassium: 4.3 mEq/L (ref 3.5–5.1)
Sodium: 132 mEq/L — ABNORMAL LOW (ref 135–145)

## 2012-10-28 MED ORDER — ONDANSETRON HCL 4 MG/2ML IJ SOLN
4.0000 mg | Freq: Once | INTRAMUSCULAR | Status: DC
  Filled 2012-10-28: qty 2

## 2012-10-28 MED ORDER — DIPHENHYDRAMINE HCL 50 MG/ML IJ SOLN
25.0000 mg | Freq: Once | INTRAMUSCULAR | Status: AC
  Administered 2012-10-28: 25 mg via INTRAVENOUS
  Filled 2012-10-28: qty 1

## 2012-10-28 MED ORDER — SODIUM CHLORIDE 0.9 % IV BOLUS (SEPSIS)
1000.0000 mL | Freq: Once | INTRAVENOUS | Status: AC
  Administered 2012-10-28: 1000 mL via INTRAVENOUS

## 2012-10-28 MED ORDER — HYDROMORPHONE HCL PF 1 MG/ML IJ SOLN
1.0000 mg | Freq: Once | INTRAMUSCULAR | Status: AC
  Administered 2012-10-28: 1 mg via INTRAVENOUS
  Filled 2012-10-28: qty 1

## 2012-10-28 MED ORDER — DEXAMETHASONE SODIUM PHOSPHATE 4 MG/ML IJ SOLN
10.0000 mg | Freq: Once | INTRAMUSCULAR | Status: AC
  Administered 2012-10-28: 10 mg via INTRAVENOUS
  Filled 2012-10-28: qty 3

## 2012-10-28 MED ORDER — KETOROLAC TROMETHAMINE 30 MG/ML IJ SOLN
30.0000 mg | Freq: Once | INTRAMUSCULAR | Status: AC
  Administered 2012-10-28: 30 mg via INTRAVENOUS
  Filled 2012-10-28: qty 1

## 2012-10-28 MED ORDER — PROMETHAZINE HCL 25 MG/ML IJ SOLN
12.5000 mg | Freq: Once | INTRAMUSCULAR | Status: AC
  Administered 2012-10-28: 12.5 mg via INTRAVENOUS
  Filled 2012-10-28: qty 1

## 2012-10-28 MED ORDER — SODIUM CHLORIDE 0.9 % IV SOLN: INTRAVENOUS | Status: DC

## 2012-10-28 NOTE — ED Provider Notes (Signed)
History  This chart was scribed for Janice Jakes, MD by Ardelia Mems, ED Scribe. This patient was seen in room APA18/[facility]18 and the patient's care was started at 8:46 PM.  CSN: [id number]  Arrival date & time 10/28/12  2014   Chief Complaint  Patient presents with  . Headache  . Nausea    The history is provided by the patient. No language interpreter was used.   HPI Comments: Janice Taylor is a 22 y.o. female with a hx of ITP who presents to the Emergency Department complaining of a gradual onset, gradually worsening, constant, severe headache onset 6 days ago. Pt states that her pain onset 6 days ago as mild pain behind her right ear that was able to be relieved by Tylenol. Pt states that 5 days ago, the headache worsened and started hurting behind her left ear as well. Pt states that her current pain is '"throbbing" and "10/10". Pt denies ear pain and changes in hearing, but reports visual disturbance in that her vision is more blurry than normal. Pt reports associated nausea with multiple episodes of emesis, without blood, over the last several days. Pt states that she has had 1 episode of emesis today. She also states that she has has mild SOB and chills over the past several days. Pt states that she has a prescription of Phenergan which she last took 6 hours ago with temporary relief. Pt states that she recently found a tick on her leg. Pt denies fever, sore throat, diarrhea, rash, dysuria or any other symptoms.   PCP- Dr. Assunta Found   Past Medical History  Diagnosis Date  . ADHD (attention deficit hyperactivity disorder)   . Depression   . Anxiety   . 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 Clotting disorder   . Cough   . Leg swelling   . 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 Intrauterine device insertion    . Tonsilectomy, adenoidectomy, bilateral myringotomy and  tubes     Family History  Problem Relation Age of Onset  . Diabetes Maternal Uncle   . Kidney disease Maternal Uncle     renal failure/dialysis  . Heart attack Maternal Grandmother    History  Substance Use Topics  . Smoking status: Never Smoker   . Smokeless tobacco: Never Used  . Alcohol Use: No   OB History   Grav Para Term Preterm Abortions TAB SAB Ect Mult Living                 Review of Systems  Constitutional: Positive for chills. Negative for fever.  HENT: Negative for congestion, sore throat and rhinorrhea.   Eyes: Positive for visual disturbance.  Respiratory: Positive for shortness of breath. Negative for cough.   Cardiovascular: Negative for chest pain and leg swelling.  Gastrointestinal: Positive for nausea and vomiting. Negative for abdominal pain and diarrhea.  Genitourinary: Negative for dysuria and hematuria.  Skin: Negative for rash.  Neurological: Positive for headaches.  Hematological: Bruises/bleeds easily.  Psychiatric/Behavioral: Negative for confusion.  A complete 10 system review of systems was obtained and all systems are negative except as noted in the HPI and PMH.   Allergies  Codeine  Home Medications   No current outpatient prescriptions on file. Triage Vitals: BP 143/82  Pulse 99  Temp(Src) 99.5 F (37.5 C) (Oral)  Resp 18  Ht 5\' 4"  (1.626  m)  Wt 270 lb (122.471 kg)  BMI 46.32 kg/m2  SpO2 99%  Physical Exam  Nursing note and vitals reviewed. Constitutional: She is oriented to person, place, and time. She appears well-developed and well-nourished.  HENT:  Head: Normocephalic and atraumatic.  Mouth/Throat: Oropharynx is clear and moist.  No redness behind her ear. No tenderness to palpation of the earlobe area. Eardrum and middle ear very normal on the right and the left.  Eyes: EOM are normal. Pupils are equal, round, and reactive to light.  Neck: Normal range of motion. Neck supple.  Cardiovascular: Normal rate, regular  rhythm, normal heart sounds and intact distal pulses.   No murmur heard. Pulmonary/Chest: Effort normal and breath sounds normal. No respiratory distress. She has no wheezes.  Abdominal: Soft. Bowel sounds are normal. There is no tenderness.  Musculoskeletal: Normal range of motion. She exhibits no tenderness.  Lymphadenopathy:    She has no cervical adenopathy.  Neurological: She is alert and oriented to person, place, and time.  Skin: Skin is warm and dry. No rash noted.  Psychiatric: She has a normal mood and affect.    ED Course  Procedures (including critical care time)  DIAGNOSTIC STUDIES: Oxygen Saturation is 99% on RA, normal by my interpretation.    COORDINATION OF CARE: 8:58 PM- Pt advised of plan for treatment and pt agrees. 10:36 PM- Recheck with pt and pt informed that head CT was negative. Pt states that her headache is still present at a 6/10 severity. Pt is agreeable to discharge.   Medications  0.9 %  sodium chloride infusion (not administered)  ondansetron (ZOFRAN) injection 4 mg (4 mg Intravenous Not Given 10/28/12 2145)  ketorolac (TORADOL) 30 MG/ML injection 30 mg (not administered)  sodium chloride 0.9 % bolus 1,000 mL (1,000 mLs Intravenous New Bag/Given 10/28/12 2132)  dexamethasone (DECADRON) injection 10 mg (10 mg Intravenous Given 10/28/12 2132)  diphenhydrAMINE (BENADRYL) injection 25 mg (25 mg Intravenous Given 10/28/12 2132)  promethazine (PHENERGAN) injection 12.5 mg (12.5 mg Intravenous Given 10/28/12 2132)  HYDROmorphone (DILAUDID) injection 1 mg (1 mg Intravenous Given 10/28/12 2132)    Labs Reviewed  BASIC METABOLIC PANEL - Abnormal; Notable for the following:    Sodium 132 (*)    All other components within normal limits  CBC WITH DIFFERENTIAL    Ct Head Wo Contrast  10/28/2012   *RADIOLOGY REPORT*  Clinical Data: Headache and nausea.  Headaches for 5 days.  Blurred vision.  Nausea and vomiting.  Pain behind the years bilaterally.  CT HEAD WITHOUT  CONTRAST  Technique:  Contiguous axial images were obtained from the base of the skull through the vertex without contrast.  Comparison: None.  Findings: The ventricles and sulci are symmetrical without significant effacement, displacement, or dilatation. No mass effect or midline shift. No abnormal extra-axial fluid collections. The grey-white matter junction is distinct. Basal cisterns are not effaced. No acute intracranial hemorrhage. No depressed skull fractures.  Visualized paranasal sinuses and mastoid air cells are not opacified.  IMPRESSION: No acute intracranial abnormalities.   Original Report Authenticated By: Burman Nieves, M.D.   Results for orders placed during the hospital encounter of 10/28/12  CBC WITH DIFFERENTIAL      Result Value Range   WBC 8.3  4.0 - 10.5 K/uL   RBC 4.71  3.87 - 5.11 MIL/uL   Hemoglobin 13.7  12.0 - 15.0 g/dL   HCT 45.4  09.8 - 11.9 %   MCV 86.4  78.0 - 100.0 fL  MCH 29.1  26.0 - 34.0 pg   MCHC 33.7  30.0 - 36.0 g/dL   RDW 52.8  41.3 - 24.4 %   Platelets 179  150 - 400 K/uL   Neutrophils Relative % 64  43 - 77 %   Neutro Abs 5.3  1.7 - 7.7 K/uL   Lymphocytes Relative 28  12 - 46 %   Lymphs Abs 2.4  0.7 - 4.0 K/uL   Monocytes Relative 7  3 - 12 %   Monocytes Absolute 0.6  0.1 - 1.0 K/uL   Eosinophils Relative 1  0 - 5 %   Eosinophils Absolute 0.1  0.0 - 0.7 K/uL   Basophils Relative 0  0 - 1 %   Basophils Absolute 0.0  0.0 - 0.1 K/uL  BASIC METABOLIC PANEL      Result Value Range   Sodium 132 (*) 135 - 145 mEq/L   Potassium 4.3  3.5 - 5.1 mEq/L   Chloride 97  96 - 112 mEq/L   CO2 28  19 - 32 mEq/L   Glucose, Bld 82  70 - 99 mg/dL   BUN 11  6 - 23 mg/dL   Creatinine, Ser 0.10  0.50 - 1.10 mg/dL   Calcium 9.5  8.4 - 27.2 mg/dL   GFR calc non Af Amer >90  >90 mL/min   GFR calc Af Amer >90  >90 mL/min     1. Headache     MDM  Patient with gradual onset headache over the past 5 days. Worse today. Patient is of age for this could be a long  tension headache or perhaps migraine headache. No formal diagnosis of migraines in the past. Patient does have a history of ITP with platelet counts are normal today. Head CT scan is negative for any significant intracranial abnormalities. Patient received migraine cocktail to include Decadron Phenergan Benadryl and hydromorphone. With improvement in the head pain to 6/10 patient looking much more comfortable. Patient also received IV hydration. Patient we discharged home for rest will followup with primary care Dr. Return for any new or worse symptoms. If symptoms persist patient may very well need MRI. No concern for subarachnoid hemorrhage no concern for meningitis.  Patient is nontoxic no acute distress.       I personally performed the services described in this documentation, which was scribed in my presence. The recorded information has been reviewed and is accurate.     Janice Jakes, MD 10/28/12 [phone]

## 2012-10-28 NOTE — ED Notes (Signed)
MD at bedside. 

## 2012-10-28 NOTE — ED Notes (Addendum)
Pt reporting headache x5 days.  Reporting blurred vision, nausea and vomiting.  Reports pain centralized behind ears bilaterally.

## 2012-11-21 ENCOUNTER — Other Ambulatory Visit (HOSPITAL_COMMUNITY): Payer: BC Managed Care – PPO

## 2012-11-22 ENCOUNTER — Encounter (HOSPITAL_COMMUNITY): Payer: BC Managed Care – PPO | Attending: Oncology

## 2012-11-22 DIAGNOSIS — D693 Immune thrombocytopenic purpura: Secondary | ICD-10-CM | POA: Insufficient documentation

## 2012-11-22 LAB — CBC
HCT: 42.6 % (ref 36.0–46.0)
Hemoglobin: 14.3 g/dL (ref 12.0–15.0)
MCH: 29.2 pg (ref 26.0–34.0)
MCHC: 33.6 g/dL (ref 30.0–36.0)
MCV: 86.9 fL (ref 78.0–100.0)
Platelets: 176 10*3/uL (ref 150–400)
RBC: 4.9 MIL/uL (ref 3.87–5.11)
RDW: 12.9 % (ref 11.5–15.5)
WBC: 6.6 10*3/uL (ref 4.0–10.5)

## 2012-11-22 NOTE — Progress Notes (Signed)
Labs drawn today for cbc 

## 2012-12-26 ENCOUNTER — Ambulatory Visit: Payer: BC Managed Care – PPO | Admitting: Women's Health

## 2013-01-02 ENCOUNTER — Other Ambulatory Visit (HOSPITAL_COMMUNITY): Payer: BC Managed Care – PPO

## 2013-01-04 ENCOUNTER — Encounter (HOSPITAL_COMMUNITY): Payer: BC Managed Care – PPO | Attending: Oncology

## 2013-01-04 DIAGNOSIS — D693 Immune thrombocytopenic purpura: Secondary | ICD-10-CM

## 2013-01-04 LAB — CBC
HCT: 42.8 % (ref 36.0–46.0)
Hemoglobin: 14.5 g/dL (ref 12.0–15.0)
MCH: 29.5 pg (ref 26.0–34.0)
MCHC: 33.9 g/dL (ref 30.0–36.0)
MCV: 87.2 fL (ref 78.0–100.0)
Platelets: 183 10*3/uL (ref 150–400)
RBC: 4.91 MIL/uL (ref 3.87–5.11)
RDW: 12.5 % (ref 11.5–15.5)
WBC: 7.3 10*3/uL (ref 4.0–10.5)

## 2013-01-04 NOTE — Progress Notes (Signed)
Labs drawn today for cbc 

## 2013-01-05 ENCOUNTER — Encounter (HOSPITAL_COMMUNITY): Payer: Self-pay | Admitting: Oncology

## 2013-01-05 ENCOUNTER — Encounter (HOSPITAL_BASED_OUTPATIENT_CLINIC_OR_DEPARTMENT_OTHER): Payer: BC Managed Care – PPO | Admitting: Oncology

## 2013-01-05 VITALS — HR 86 | Temp 98.6°F | Resp 18 | Wt 290.1 lb

## 2013-01-05 DIAGNOSIS — D693 Immune thrombocytopenic purpura: Secondary | ICD-10-CM

## 2013-01-05 DIAGNOSIS — E669 Obesity, unspecified: Secondary | ICD-10-CM

## 2013-01-05 HISTORY — DX: Obesity, unspecified: E66.9

## 2013-01-05 NOTE — Patient Instructions (Addendum)
[facility] Discharge Instructions  RECOMMENDATIONS MADE BY THE CONSULTANT AND ANY TEST RESULTS WILL BE SENT TO YOUR REFERRING PHYSICIAN.  Lab work in 8 weeks and 16 weeks. See MD after 16 weeks lab.  Thank you for choosing [name] to provide your oncology and hematology care.  To afford each patient quality time with our providers, please arrive at least 15 minutes before your scheduled appointment time.  With your help, our goal is to use those 15 minutes to complete the necessary work-up to ensure our physicians have the information they need to help with your evaluation and healthcare recommendations.    Effective January 1st, 2014, we ask that you re-schedule your appointment with our physicians should you arrive 10 or more minutes late for your appointment.  We strive to give you quality time with our providers, and arriving late affects you and other patients whose appointments are after yours.    Again, thank you for choosing [facility].  Our hope is that these requests will decrease the amount of time that you wait before being seen by our physicians.       _____________________________________________________________  Should you have questions after your visit to [facility], please contact our office at [phone] between the hours of 8:30 a.m. and 5:00 p.m.  Voicemails left after 4:30 p.m. will not be returned until the following business day.  For prescription refill requests, have your pharmacy contact our office with your prescription refill request.

## 2013-01-05 NOTE — Progress Notes (Signed)
Janice Ribas, MD [address] [place] [address]  ITP (idiopathic thrombocytopenic purpura)  CURRENT THERAPY:Observation. Seen by General surgeon (Dr. Biagio Quint) for splenectomy if needed in the future. Patient received vaccinations appropriately.   INTERVAL HISTORY: Janice Taylor 22 y.o. female returns for  regular  visit for followup of ITP.  I personally reviewed and went over laboratory results with the patient.  Over the past 4 months, Janice Taylor has not displayed any evidence of thrombocytopenia.  She has ranged between 176,000- 200,000 since May 2014.    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  She reports that she really enjoys this position and is pleased with this line of work for the time being.   She was educated regarding ITP and signs and symptoms to report to the clinic.  She was educated on signs and symptoms to report to ED including spontaneous bleeding, hemoptysis, etc.   She asks about "the weight loss pill."  I do not have any experience with these medications, but I know they exist.  There is a long-acting version in addition to a shorter acting version.  The names are slipping my mind.  She asked if I have had any success with these medication and I frankly told her that I do not because they are not medications we use at the [facility].  I have declined to provide her with these medications and I have deferred to PCP.   We will graduate her to labs every 8 weeks with office visits every 4 months.   Hematologically she denies any complaints and ROS questioning is negative.   Past Medical History  Diagnosis Date  . ADHD (attention deficit hyperactivity disorder)   . Depression   . Anxiety   . 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 Clotting disorder   . Cough   . Leg swelling   . ITP  (idiopathic thrombocytopenic purpura) 11/25/2011    Consistent with ITP     has ITP (idiopathic thrombocytopenic purpura) on her problem list.     is allergic to codeine.  Janice Taylor does not currently have medications on file.  Past Surgical History  Procedure Laterality Date  . Intrauterine device insertion    . Tonsilectomy, adenoidectomy, bilateral myringotomy and tubes      Denies any headaches, dizziness, double vision, fevers, chills, night sweats, nausea, vomiting, diarrhea, constipation, chest pain, heart palpitations, shortness of breath, blood in stool, black tarry stool, urinary pain, urinary burning, urinary frequency, hematuria.   PHYSICAL EXAMINATION  ECOG PERFORMANCE STATUS: 0 - Asymptomatic  Filed Vitals:   01/05/13 1138  Pulse: 86  Temp: 98.6 F (37 C)  Resp: 18    GENERAL:alert, no distress, well nourished, well developed, comfortable, cooperative, obese and smiling SKIN: skin color, texture, turgor are normal, no rashes or significant lesions HEAD: Normocephalic, No masses, lesions, tenderness or abnormalities EYES: normal, PERRLA, EOMI, Conjunctiva are pink and non-injected EARS: External ears normal OROPHARYNX:mucous membranes are moist  NECK: supple, no adenopathy, thyroid normal size, non-tender, without nodularity, no stridor, non-tender, trachea midline LYMPH:  no palpable lymphadenopathy BREAST:not examined LUNGS: clear to auscultation and percussion HEART: regular rate & rhythm, no murmurs, no gallops, S1 normal and S2 normal ABDOMEN:abdomen soft, non-tender, obese and normal bowel sounds BACK: Back symmetric, no curvature., No  CVA tenderness EXTREMITIES:less then 2 second capillary refill, no joint deformities, effusion, or inflammation, no edema, no skin discoloration, no clubbing, no cyanosis  NEURO: alert & oriented x 3 with fluent speech, no focal motor/sensory deficits, gait normal    LABORATORY DATA: CBC    Component Value Date/Time    WBC 7.3 01/04/2013 1101   RBC 4.91 01/04/2013 1101   HGB 14.5 01/04/2013 1101   HCT 42.8 01/04/2013 1101   PLT 183 01/04/2013 1101   MCV 87.2 01/04/2013 1101   MCH 29.5 01/04/2013 1101   MCHC 33.9 01/04/2013 1101   RDW 12.5 01/04/2013 1101   LYMPHSABS 2.4 10/28/2012 2126   MONOABS 0.6 10/28/2012 2126   EOSABS 0.1 10/28/2012 2126   BASOSABS 0.0 10/28/2012 2126    Results for Janice Taylor, Janice Taylor (MRN [id number]) as of 01/05/2013 11:37  Ref. Range 08/25/2012 10:39 10/09/2012 12:40 10/28/2012 21:26 11/22/2012 10:47 01/04/2013 11:01  Platelets Latest Range: 150-400 K/uL 184 200 179 176 183      ASSESSMENT:  1. 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 Peripheral blood smear revealed lack of platelets at time of presentation with normal appearing red and white blood cells. She was treated with 2 cycles of Dexamethasone pulses at 20 mg BID x 4 days every 28 days with a nice response. Platelets have been stable. She has seen surgeon, Dr. Biagio Quint, in past about potential splenectomy if needed. She has been appropriately vaccinated in preparation for this intervention.  2. Obesity  Patient Active Problem List   Diagnosis Date Noted  . 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     PLAN:  1. I personally reviewed and went over laboratory results with the patient. 2. Labs every 8 weeks: CBC 3. Patient education regarding ITP 4. Patient education regarding petechial rash. 5. Patient education regarding signs and symptoms that require reporting to ED. 6. Return in 4 months for follow-up.   THERAPY PLAN:  Will continue to perform CBC every 8 weeks and have her return in 4 months.  In the future, we can entertain decreasing frequency of labs.  All questions were answered. The patient knows to call the clinic with any problems, questions or concerns. We can certainly see the patient much sooner if necessary.  Patient and plan discussed with Dr. Erline Hau and he is in agreement with the aforementioned.   More than 50% of the time spent with the patient was utilized for counseling and coordination of care.   KEFALAS,THOMAS

## 2013-03-02 ENCOUNTER — Other Ambulatory Visit (HOSPITAL_COMMUNITY): Payer: BC Managed Care – PPO

## 2013-05-10 NOTE — Progress Notes (Signed)
-   No show, letter sent- KEFALAS,THOMAS   

## 2013-05-11 ENCOUNTER — Ambulatory Visit (HOSPITAL_COMMUNITY): Payer: BC Managed Care – PPO | Admitting: Oncology

## 2013-06-15 ENCOUNTER — Encounter (HOSPITAL_COMMUNITY): Payer: Self-pay | Admitting: Oncology

## 2013-06-28 ENCOUNTER — Other Ambulatory Visit (HOSPITAL_COMMUNITY): Payer: BC Managed Care – PPO

## 2013-06-29 ENCOUNTER — Emergency Department (HOSPITAL_COMMUNITY)
Admission: EM | Admit: 2013-06-29 | Discharge: 2013-06-29 | Disposition: A | Discharge status: Home / Self Care | Payer: BC Managed Care – PPO | Attending: Emergency Medicine | Admitting: Emergency Medicine

## 2013-06-29 ENCOUNTER — Emergency Department (HOSPITAL_COMMUNITY): Payer: BC Managed Care – PPO

## 2013-06-29 ENCOUNTER — Encounter (HOSPITAL_COMMUNITY): Payer: Self-pay | Admitting: Emergency Medicine

## 2013-06-29 DIAGNOSIS — W108XXA Fall (on) (from) other stairs and steps, initial encounter: Secondary | ICD-10-CM | POA: Insufficient documentation

## 2013-06-29 DIAGNOSIS — Z862 Personal history of diseases of the blood and blood-forming organs and certain disorders involving the immune mechanism: Secondary | ICD-10-CM | POA: Insufficient documentation

## 2013-06-29 DIAGNOSIS — F329 Major depressive disorder, single episode, unspecified: Secondary | ICD-10-CM | POA: Insufficient documentation

## 2013-06-29 DIAGNOSIS — F3289 Other specified depressive episodes: Secondary | ICD-10-CM | POA: Insufficient documentation

## 2013-06-29 DIAGNOSIS — F411 Generalized anxiety disorder: Secondary | ICD-10-CM | POA: Insufficient documentation

## 2013-06-29 DIAGNOSIS — Y929 Unspecified place or not applicable: Secondary | ICD-10-CM | POA: Insufficient documentation

## 2013-06-29 DIAGNOSIS — Y9389 Activity, other specified: Secondary | ICD-10-CM | POA: Insufficient documentation

## 2013-06-29 DIAGNOSIS — S92301A Fracture of unspecified metatarsal bone(s), right foot, initial encounter for closed fracture: Secondary | ICD-10-CM

## 2013-06-29 DIAGNOSIS — S92309A Fracture of unspecified metatarsal bone(s), unspecified foot, initial encounter for closed fracture: Secondary | ICD-10-CM | POA: Insufficient documentation

## 2013-06-29 DIAGNOSIS — E669 Obesity, unspecified: Secondary | ICD-10-CM | POA: Insufficient documentation

## 2013-06-29 MED ORDER — IBUPROFEN 600 MG PO TABS: 600.0000 mg | ORAL_TABLET | Freq: Four times a day (QID) | ORAL | Status: DC | PRN

## 2013-06-29 MED ORDER — OXYCODONE-ACETAMINOPHEN 5-325 MG PO TABS
1.0000 | ORAL_TABLET | Freq: Once | ORAL | Status: AC
  Administered 2013-06-29: 1 via ORAL
  Filled 2013-06-29: qty 1

## 2013-06-29 MED ORDER — PROMETHAZINE HCL 25 MG/ML IJ SOLN
25.0000 mg | Freq: Once | INTRAMUSCULAR | Status: AC
  Administered 2013-06-29: 25 mg via INTRAMUSCULAR
  Filled 2013-06-29: qty 1

## 2013-06-29 MED ORDER — KETOROLAC TROMETHAMINE 60 MG/2ML IM SOLN
60.0000 mg | Freq: Once | INTRAMUSCULAR | Status: AC
  Administered 2013-06-29: 60 mg via INTRAMUSCULAR
  Filled 2013-06-29: qty 2

## 2013-06-29 MED ORDER — OXYCODONE-ACETAMINOPHEN 5-325 MG PO TABS: 1.0000 | ORAL_TABLET | ORAL | Status: DC | PRN

## 2013-06-29 NOTE — ED Notes (Signed)
Pt fell down the steps this am. Edema and pain to R ankle.

## 2013-06-29 NOTE — Discharge Instructions (Signed)
Metatarsal Fracture, Undisplaced A metatarsal fracture is a break in the bone(s) of the foot. These are the bones of the foot that connect your toes to the bones of the ankle. DIAGNOSIS  The diagnoses of these fractures are usually made with X-rays. If there are problems in the forefoot and x-rays are normal a later bone scan will usually make the diagnosis.  TREATMENT AND HOME CARE INSTRUCTIONS  Treatment may or may not include a cast or walking shoe. When casts are needed the use is usually for short periods of time so as not to slow down healing with muscle wasting (atrophy).  Activities should be stopped until further advised by your caregiver.  Wear shoes with adequate shock absorbing capabilities and stiff soles.  Alternative exercise may be undertaken while waiting for healing. These may include bicycling and swimming, or as your caregiver suggests.  It is important to keep all follow-up visits or specialty referrals. The failure to keep these appointments could result in improper bone healing and chronic pain or disability.  Warning: Do not drive a car or operate a motor vehicle until your caregiver specifically tells you it is safe to do so. IF YOU DO NOT HAVE A CAST OR SPLINT:  You may walk on your injured foot as tolerated or advised.  Do not put any weight on your injured foot for as long as directed by your caregiver. Slowly increase the amount of time you walk on the foot as the pain allows or as advised.  Use crutches until you can bear weight without pain. A gradual increase in weight bearing may help.  Apply ice to the injury for 15-20 minutes each hour while awake for the first 2 days. Put the ice in a plastic bag and place a towel between the bag of ice and your skin.  Only take over-the-counter or prescription medicines for pain, discomfort, or fever as directed by your caregiver. SEEK IMMEDIATE MEDICAL CARE IF:   Your cast gets damaged or breaks.  You have  continued severe pain or more swelling than you did before the cast was put on, or the pain is not controlled with medications.  Your skin or nails below the injury turn blue or grey, or feel cold or numb.  There is a bad smell, or new stains or pus-like (purulent) drainage coming from the cast. MAKE SURE YOU:   Understand these instructions.  Will watch your condition.  Will get help right away if you are not doing well or get worse. Document Released: 01/02/2002 Document Revised: 07/05/2011 Document Reviewed: 11/24/2007 [facility] Patient Information 2014 [place].   You may take the oxycodone prescribed for pain relief.  This will make you drowsy - do not drive within 4 hours of taking this medication.

## 2013-06-29 NOTE — ED Notes (Signed)
Pt assisted to bathroom

## 2013-07-02 NOTE — ED Provider Notes (Signed)
CSN: [id number]     Arrival date & time 06/29/13  1126 History   First MD Initiated Contact with Patient 06/29/13 1148     Chief Complaint  Patient presents with  . Ankle Pain     (Consider location/radiation/quality/duration/timing/severity/associated sxs/prior Treatment) Patient is a 23 y.o. female presenting with ankle pain. The history is provided by the patient.  Ankle Pain Location:  Ankle and foot Time since incident:  3 hours Injury: yes   Mechanism of injury: fall   Fall:    Fall occurred:  Down stairs   Height of fall:  She slid down 6 steps   Impact surface:  Hard floor   Point of impact:  Feet   Entrapped after fall: no   Ankle location:  R ankle Foot location:  R foot Pain details:    Quality:  Sharp   Radiates to:  Does not radiate   Severity:  Moderate   Onset quality:  Sudden   Duration:  3 hours   Timing:  Constant   Progression:  Worsening Chronicity:  New Foreign body present:  No foreign bodies Prior injury to area:  No Relieved by:  None tried Worsened by:  Bearing weight and flexion Ineffective treatments:  None tried Associated symptoms: decreased ROM and swelling   Associated symptoms: no numbness and no tingling     Past Medical History  Diagnosis Date  . ADHD (attention deficit hyperactivity disorder)   . Depression   . Anxiety   . 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 Clotting disorder   . Cough   . Leg swelling   . 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 Obesity 01/05/2013   Past Surgical History  Procedure Laterality Date  . Intrauterine device insertion    . Tonsilectomy, adenoidectomy, bilateral myringotomy and tubes    . Tonsillectomy     Family History  Problem Relation Age of Onset  . Diabetes Maternal Uncle   . Kidney disease Maternal Uncle     renal failure/dialysis  . Heart attack Maternal Grandmother    History  Substance Use Topics  . Smoking status: Never  Smoker   . Smokeless tobacco: Never Used  . Alcohol Use: No   OB History   Grav Para Term Preterm Abortions TAB SAB Ect Mult Living   1 1 1             Review of Systems  Musculoskeletal: Positive for arthralgias and joint swelling.  Skin: Negative for wound.  Neurological: Negative for weakness and numbness.      Allergies  Codeine  Home Medications   Current Outpatient Rx  Name  Route  Sig  Dispense  Refill  . ALPRAZolam (XANAX) 1 MG tablet   Oral   Take 1 mg by mouth daily as needed for anxiety.          Marland Kitchen aspirin-acetaminophen-caffeine (EXCEDRIN MIGRAINE) 250-250-65 MG per tablet   Oral   Take 2 tablets by mouth every 6 (six) hours as needed for headache.         . levonorgestrel (MIRENA) 20 MCG/24HR IUD   Intrauterine   1 each by Intrauterine route once.         . Multiple Vitamin (MULTIVITAMIN WITH MINERALS) TABS tablet   Oral   Take 1 tablet by mouth daily.         Marland Kitchen ibuprofen (ADVIL,MOTRIN) 600 MG tablet   Oral   Take  1 tablet (600 mg total) by mouth every 6 (six) hours as needed.   30 tablet   0   . oxyCODONE-acetaminophen (PERCOCET/ROXICET) 5-325 MG per tablet   Oral   Take 1 tablet by mouth every 4 (four) hours as needed for severe pain.   20 tablet   0    BP 142/82  Pulse 109  Temp(Src) 97.4 F (36.3 C) (Oral)  Resp 16  Ht 5\' 4"  (1.626 m)  Wt 250 lb (113.399 kg)  BMI 42.89 kg/m2  SpO2 100% Physical Exam  Nursing note and vitals reviewed. Constitutional: She appears well-developed and well-nourished.  HENT:  Head: Normocephalic.  Cardiovascular: Normal rate and intact distal pulses.  Exam reveals no decreased pulses.   Pulses:      Dorsalis pedis pulses are 2+ on the right side, and 2+ on the left side.       Posterior tibial pulses are 2+ on the right side, and 2+ on the left side.  Musculoskeletal: She exhibits edema and tenderness.       Right ankle: She exhibits decreased range of motion, swelling and ecchymosis. She  exhibits normal pulse. Tenderness. Lateral malleolus tenderness found. No head of 5th metatarsal and no proximal fibula tenderness found. Achilles tendon normal.       Right foot: She exhibits bony tenderness. She exhibits normal capillary refill and no deformity.       Feet:  Neurological: She is alert. No sensory deficit.  Skin: Skin is warm, dry and intact.    ED Course  Procedures (including critical care time) Labs Review Labs Reviewed - No data to display Imaging Review No results found.   EKG Interpretation None      MDM   Final diagnoses:  Fracture of metatarsal bone of right foot    Patients labs and/or radiological studies were viewed and considered during the medical decision making and disposition process. Recommended to pt cam walker which patient refused, so placed in posterior splint.  Also suggested walker after patient refused crutches, stating she has tried to use in the past with no success.  Pt most comfortable with wheelchair as she did not feel she could maintain no weight bearing using walker.  Sh was given a referral to ortho for f/u care,  Encouraged RICE, prescribed ibuprofen and oxycodone for pain relief.    Evalee Jefferson, PA-C 07/02/13 2211

## 2013-07-03 NOTE — ED Provider Notes (Signed)
Medical screening examination/treatment/procedure(s) were performed by non-physician practitioner and as supervising physician I was immediately available for consultation/collaboration.   EKG Interpretation None       Nat Christen, MD 07/03/13 1028

## 2013-07-09 ENCOUNTER — Emergency Department (HOSPITAL_COMMUNITY)
Admission: EM | Admit: 2013-07-09 | Discharge: 2013-07-09 | Disposition: A | Discharge status: Home / Self Care | Payer: BC Managed Care – PPO | Attending: Emergency Medicine | Admitting: Emergency Medicine

## 2013-07-09 DIAGNOSIS — Z79899 Other long term (current) drug therapy: Secondary | ICD-10-CM | POA: Insufficient documentation

## 2013-07-09 DIAGNOSIS — F3289 Other specified depressive episodes: Secondary | ICD-10-CM | POA: Insufficient documentation

## 2013-07-09 DIAGNOSIS — Z862 Personal history of diseases of the blood and blood-forming organs and certain disorders involving the immune mechanism: Secondary | ICD-10-CM | POA: Insufficient documentation

## 2013-07-09 DIAGNOSIS — F329 Major depressive disorder, single episode, unspecified: Secondary | ICD-10-CM | POA: Insufficient documentation

## 2013-07-09 DIAGNOSIS — I10 Essential (primary) hypertension: Secondary | ICD-10-CM | POA: Insufficient documentation

## 2013-07-09 DIAGNOSIS — E669 Obesity, unspecified: Secondary | ICD-10-CM | POA: Insufficient documentation

## 2013-07-09 DIAGNOSIS — Z975 Presence of (intrauterine) contraceptive device: Secondary | ICD-10-CM | POA: Insufficient documentation

## 2013-07-09 DIAGNOSIS — F411 Generalized anxiety disorder: Secondary | ICD-10-CM | POA: Insufficient documentation

## 2013-07-09 MED ORDER — LISINOPRIL 20 MG PO TABS
20.0000 mg | ORAL_TABLET | Freq: Every day | ORAL | Status: DC
Start: 2013-07-09 — End: 2015-01-22

## 2013-07-09 NOTE — Discharge Instructions (Signed)
Follow up with your md in 1 week °

## 2013-07-09 NOTE — ED Notes (Signed)
Pt was sent to ED by her MD for evaluation of elevated blood pressure on 3/9 and 3/16.  (198/119)

## 2013-07-09 NOTE — ED Provider Notes (Signed)
CSN: [id number]     Arrival date & time 07/09/13  1438 History   First MD Initiated Contact with Patient 07/09/13 1518     Chief Complaint  Patient presents with  . Hypertension     (Consider location/radiation/quality/duration/timing/severity/associated sxs/prior Treatment) Patient is a 23 y.o. female presenting with hypertension. The history is provided by the patient (the pts blood pressure has been running high and she was sent to the er for evaluation).  Hypertension This is a new problem. The current episode started more than 2 days ago. The problem occurs constantly. The problem has not changed since onset.Pertinent negatives include no chest pain, no abdominal pain and no headaches. Nothing aggravates the symptoms. Nothing relieves the symptoms.    Past Medical History  Diagnosis Date  . ADHD (attention deficit hyperactivity disorder)   . Depression   . Anxiety   . 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 Clotting disorder   . Cough   . Leg swelling   . 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 Obesity 01/05/2013   Past Surgical History  Procedure Laterality Date  . Intrauterine device insertion    . Tonsilectomy, adenoidectomy, bilateral myringotomy and tubes    . Tonsillectomy     Family History  Problem Relation Age of Onset  . Diabetes Maternal Uncle   . Kidney disease Maternal Uncle     renal failure/dialysis  . Heart attack Maternal Grandmother    History  Substance Use Topics  . Smoking status: Never Smoker   . Smokeless tobacco: Never Used  . Alcohol Use: No   OB History   Grav Para Term Preterm Abortions TAB SAB Ect Mult Living   1 1 1             Review of Systems  Constitutional: Negative for appetite change and fatigue.  HENT: Negative for congestion, ear discharge and sinus pressure.   Eyes: Negative for discharge.  Respiratory: Negative for cough.   Cardiovascular: Negative for chest pain.   Gastrointestinal: Negative for abdominal pain and diarrhea.  Genitourinary: Negative for frequency and hematuria.  Musculoskeletal: Negative for back pain.  Skin: Negative for rash.  Neurological: Negative for seizures and headaches.  Psychiatric/Behavioral: Negative for hallucinations.      Allergies  Codeine  Home Medications   Current Outpatient Rx  Name  Route  Sig  Dispense  Refill  . ALPRAZolam (XANAX) 1 MG tablet   Oral   Take 1 mg by mouth daily as needed for anxiety.          Marland Kitchen aspirin-acetaminophen-caffeine (EXCEDRIN MIGRAINE) 250-250-65 MG per tablet   Oral   Take 2 tablets by mouth every 6 (six) hours as needed for headache.         . ibuprofen (ADVIL,MOTRIN) 600 MG tablet   Oral   Take 1 tablet (600 mg total) by mouth every 6 (six) hours as needed.   30 tablet   0   . levonorgestrel (MIRENA) 20 MCG/24HR IUD   Intrauterine   1 each by Intrauterine route once.         Marland Kitchen lisinopril (PRINIVIL,ZESTRIL) 20 MG tablet   Oral   Take 1 tablet (20 mg total) by mouth daily.   30 tablet   0   . Multiple Vitamin (MULTIVITAMIN WITH MINERALS) TABS tablet   Oral   Take 1 tablet by mouth daily.         Marland Kitchen oxyCODONE-acetaminophen (  PERCOCET/ROXICET) 5-325 MG per tablet   Oral   Take 1 tablet by mouth every 4 (four) hours as needed for severe pain.   20 tablet   0    BP 164/94  Pulse 104  Temp(Src) 98.3 F (36.8 C) (Oral)  Resp 18  Ht 5\' 4"  (1.626 m)  Wt 270 lb (122.471 kg)  BMI 46.32 kg/m2  SpO2 100% Physical Exam  Constitutional: She is oriented to person, place, and time. She appears well-developed.  HENT:  Head: Normocephalic.  Eyes: Conjunctivae and EOM are normal. No scleral icterus.  Neck: Neck supple. No thyromegaly present.  Cardiovascular: Normal rate and regular rhythm.  Exam reveals no gallop and no friction rub.   No murmur heard. Pulmonary/Chest: No stridor. She has no wheezes. She has no rales. She exhibits no tenderness.   Abdominal: She exhibits no distension. There is no tenderness. There is no rebound.  Musculoskeletal: Normal range of motion. She exhibits no edema.  Cast on right foot for fracture  Lymphadenopathy:    She has no cervical adenopathy.  Neurological: She is oriented to person, place, and time. She exhibits normal muscle tone. Coordination normal.  Skin: No rash noted. No erythema.  Psychiatric: She has a normal mood and affect. Her behavior is normal.    ED Course  Procedures (including critical care time) Labs Review Labs Reviewed - No data to display Imaging Review No results found.   EKG Interpretation None      MDM   Final diagnoses:  Hypertension    Will start lisinopril and have pt follow up    Maudry Diego, MD 07/09/13 [phone]

## 2013-10-15 ENCOUNTER — Encounter (HOSPITAL_COMMUNITY): Payer: Self-pay | Admitting: Oncology

## 2014-01-11 ENCOUNTER — Other Ambulatory Visit (HOSPITAL_COMMUNITY): Payer: Self-pay | Admitting: Nurse Practitioner

## 2014-01-11 DIAGNOSIS — N949 Unspecified condition associated with female genital organs and menstrual cycle: Secondary | ICD-10-CM

## 2014-01-21 ENCOUNTER — Ambulatory Visit (HOSPITAL_COMMUNITY)
Admission: RE | Admit: 2014-01-21 | Discharge: 2014-01-21 | Disposition: A | Discharge status: Home / Self Care | Payer: BC Managed Care – PPO | Source: Ambulatory Visit | Attending: Nurse Practitioner | Admitting: Nurse Practitioner

## 2014-01-21 DIAGNOSIS — N949 Unspecified condition associated with female genital organs and menstrual cycle: Secondary | ICD-10-CM | POA: Insufficient documentation

## 2014-02-25 ENCOUNTER — Encounter (HOSPITAL_COMMUNITY): Payer: Self-pay | Admitting: Emergency Medicine

## 2015-01-22 ENCOUNTER — Emergency Department (HOSPITAL_COMMUNITY): Payer: BC Managed Care – PPO

## 2015-01-22 ENCOUNTER — Encounter (HOSPITAL_COMMUNITY): Payer: Self-pay | Admitting: Emergency Medicine

## 2015-01-22 ENCOUNTER — Emergency Department (HOSPITAL_COMMUNITY)
Admission: EM | Admit: 2015-01-22 | Discharge: 2015-01-23 | Disposition: A | Discharge status: Home / Self Care | Payer: BC Managed Care – PPO | Attending: Emergency Medicine | Admitting: Emergency Medicine

## 2015-01-22 DIAGNOSIS — Z3202 Encounter for pregnancy test, result negative: Secondary | ICD-10-CM | POA: Insufficient documentation

## 2015-01-22 DIAGNOSIS — R109 Unspecified abdominal pain: Secondary | ICD-10-CM

## 2015-01-22 DIAGNOSIS — N23 Unspecified renal colic: Secondary | ICD-10-CM | POA: Insufficient documentation

## 2015-01-22 DIAGNOSIS — R112 Nausea with vomiting, unspecified: Secondary | ICD-10-CM | POA: Insufficient documentation

## 2015-01-22 DIAGNOSIS — M549 Dorsalgia, unspecified: Secondary | ICD-10-CM | POA: Insufficient documentation

## 2015-01-22 DIAGNOSIS — E669 Obesity, unspecified: Secondary | ICD-10-CM | POA: Insufficient documentation

## 2015-01-22 DIAGNOSIS — Z8659 Personal history of other mental and behavioral disorders: Secondary | ICD-10-CM | POA: Insufficient documentation

## 2015-01-22 DIAGNOSIS — Z862 Personal history of diseases of the blood and blood-forming organs and certain disorders involving the immune mechanism: Secondary | ICD-10-CM | POA: Insufficient documentation

## 2015-01-22 DIAGNOSIS — Z87442 Personal history of urinary calculi: Secondary | ICD-10-CM | POA: Insufficient documentation

## 2015-01-22 LAB — CBC WITH DIFFERENTIAL/PLATELET
Basophils Absolute: 0 10*3/uL (ref 0.0–0.1)
Basophils Relative: 0 %
Eosinophils Absolute: 0.1 10*3/uL (ref 0.0–0.7)
Eosinophils Relative: 1 %
HCT: 41.1 % (ref 36.0–46.0)
Hemoglobin: 13.7 g/dL (ref 12.0–15.0)
Lymphocytes Relative: 34 %
Lymphs Abs: 3.1 10*3/uL (ref 0.7–4.0)
MCH: 29.7 pg (ref 26.0–34.0)
MCHC: 33.3 g/dL (ref 30.0–36.0)
MCV: 89 fL (ref 78.0–100.0)
Monocytes Absolute: 0.4 10*3/uL (ref 0.1–1.0)
Monocytes Relative: 4 %
Neutro Abs: 5.4 10*3/uL (ref 1.7–7.7)
Neutrophils Relative %: 61 %
Platelets: 198 10*3/uL (ref 150–400)
RBC: 4.62 MIL/uL (ref 3.87–5.11)
RDW: 12.2 % (ref 11.5–15.5)
WBC: 9 10*3/uL (ref 4.0–10.5)

## 2015-01-22 LAB — COMPREHENSIVE METABOLIC PANEL
ALT: 26 U/L (ref 14–54)
AST: 20 U/L (ref 15–41)
Albumin: 3.9 g/dL (ref 3.5–5.0)
Alkaline Phosphatase: 80 U/L (ref 38–126)
Anion gap: 8 (ref 5–15)
BUN: 10 mg/dL (ref 6–20)
CO2: 26 mmol/L (ref 22–32)
Calcium: 8.2 mg/dL — ABNORMAL LOW (ref 8.9–10.3)
Chloride: 103 mmol/L (ref 101–111)
Creatinine, Ser: 0.95 mg/dL (ref 0.44–1.00)
GFR calc Af Amer: >60 mL/min (ref >60)
GFR calc non Af Amer: >60 mL/min (ref >60)
Glucose, Bld: 128 mg/dL — ABNORMAL HIGH (ref 65–99)
Potassium: 3.7 mmol/L (ref 3.5–5.1)
Sodium: 137 mmol/L (ref 135–145)
Total Bilirubin: 0.4 mg/dL (ref 0.3–1.2)
Total Protein: 7.1 g/dL (ref 6.5–8.1)

## 2015-01-22 LAB — URINALYSIS, ROUTINE W REFLEX MICROSCOPIC
Bilirubin Urine: NEGATIVE
Glucose, UA: NEGATIVE mg/dL
Ketones, ur: NEGATIVE mg/dL
Leukocytes, UA: NEGATIVE
Nitrite: NEGATIVE
Protein, ur: NEGATIVE mg/dL
Specific Gravity, Urine: >1.03 — ABNORMAL HIGH (ref 1.005–1.030)
Urobilinogen, UA: 0.2 mg/dL (ref 0.0–1.0)
pH: 6 (ref 5.0–8.0)

## 2015-01-22 LAB — URINE MICROSCOPIC-ADD ON

## 2015-01-22 LAB — PREGNANCY, URINE: Preg Test, Ur: NEGATIVE

## 2015-01-22 MED ORDER — SODIUM CHLORIDE 0.9 % IV SOLN: Freq: Once | INTRAVENOUS | Status: DC

## 2015-01-22 MED ORDER — HYDROCODONE-ACETAMINOPHEN 7.5-325 MG PO TABS: 1.0000 | ORAL_TABLET | ORAL | Status: DC | PRN

## 2015-01-22 MED ORDER — PROMETHAZINE HCL 25 MG PO TABS: 25.0000 mg | ORAL_TABLET | Freq: Four times a day (QID) | ORAL | Status: DC | PRN

## 2015-01-22 MED ORDER — ONDANSETRON HCL 4 MG/2ML IJ SOLN: 4.0000 mg | Freq: Once | INTRAMUSCULAR | Status: DC

## 2015-01-22 MED ORDER — PROMETHAZINE HCL 25 MG/ML IJ SOLN
12.5000 mg | Freq: Once | INTRAMUSCULAR | Status: AC
Start: 2015-01-22 — End: 2015-01-22
  Administered 2015-01-22: 12.5 mg via INTRAMUSCULAR
  Filled 2015-01-22: qty 1

## 2015-01-22 MED ORDER — METOCLOPRAMIDE HCL 5 MG/ML IJ SOLN
5.0000 mg | Freq: Once | INTRAMUSCULAR | Status: DC
  Filled 2015-01-22: qty 2

## 2015-01-22 MED ORDER — KETOROLAC TROMETHAMINE 30 MG/ML IJ SOLN
30.0000 mg | Freq: Once | INTRAMUSCULAR | Status: AC
  Administered 2015-01-22: 30 mg via INTRAMUSCULAR

## 2015-01-22 MED ORDER — SODIUM CHLORIDE 0.9 % IV BOLUS (SEPSIS): 1000.0000 mL | Freq: Once | INTRAVENOUS | Status: DC

## 2015-01-22 MED ORDER — KETOROLAC TROMETHAMINE 30 MG/ML IJ SOLN
30.0000 mg | Freq: Once | INTRAMUSCULAR | Status: DC
  Filled 2015-01-22: qty 1

## 2015-01-22 MED ORDER — METOCLOPRAMIDE HCL 5 MG/ML IJ SOLN
10.0000 mg | Freq: Once | INTRAMUSCULAR | Status: AC
  Administered 2015-01-22: 10 mg via INTRAMUSCULAR

## 2015-01-22 MED ORDER — HYDROMORPHONE HCL 1 MG/ML IJ SOLN
1.0000 mg | Freq: Once | INTRAMUSCULAR | Status: AC
Start: 2015-01-22 — End: 2015-01-22
  Administered 2015-01-22: 1 mg via INTRAMUSCULAR
  Filled 2015-01-22: qty 1

## 2015-01-22 NOTE — ED Notes (Signed)
Pt states that this am she started having pain in her Lt flank area - pain moved around into her rt lower abdo - has hx of kidney stones and she states that this is what it feels like.Feeling nauseated with vomiting after eating or drinking anything

## 2015-01-22 NOTE — Discharge Instructions (Signed)

## 2015-01-22 NOTE — ED Notes (Signed)
Patient transported to CT 

## 2015-01-22 NOTE — ED Provider Notes (Signed)
CSN: [id number]     Arrival date & time 01/22/15  1854 History   First MD Initiated Contact with Patient 01/22/15 2056     Chief Complaint  Patient presents with  . Abdominal Pain     (Consider location/radiation/quality/duration/timing/severity/associated sxs/prior Treatment) HPI Comments: 24 y.o. Female with history of kidney stones presents for left flank pain.  The patient reports that the pain started earlier today back by her left kidney and that over the course of the day she could feel the stone moving down her side and she now feels it in her left groin.  She says the pain is sharp and feels like when she has had a kidney stone before.  No fever, chills.  She has had some nausea and vomiting.  No diarrhea or constipation.  She reports normal menses.  No vaginal complaints or bleeding.  Patient is a 24 y.o. female presenting with abdominal pain.  Abdominal Pain Associated symptoms: nausea and vomiting   Associated symptoms: no chest pain, no chills, no constipation, no diarrhea, no dysuria, no fatigue, no fever, no hematuria, no shortness of breath, no vaginal bleeding and no vaginal discharge     Past Medical History  Diagnosis Date  . ADHD (attention deficit hyperactivity disorder)   . Depression   . Anxiety   . 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 Clotting disorder   . Cough   . Leg swelling   . 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 Obesity 01/05/2013   Past Surgical History  Procedure Laterality Date  . Tonsilectomy, adenoidectomy, bilateral myringotomy and tubes    . Tonsillectomy     Family History  Problem Relation Age of Onset  . Diabetes Maternal Uncle   . Kidney disease Maternal Uncle     renal failure/dialysis  . Heart attack Maternal Grandmother    Social History  Substance Use Topics  . Smoking status: Never Smoker   . Smokeless tobacco: Never Used  . Alcohol Use: No   OB History    Gravida  Para Term Preterm AB TAB SAB Ectopic Multiple Living   1 1 1             Review of Systems  Constitutional: Negative for fever, chills, appetite change and fatigue.  HENT: Negative for congestion, postnasal drip and rhinorrhea.   Eyes: Negative for pain and redness.  Respiratory: Negative for chest tightness and shortness of breath.   Cardiovascular: Negative for chest pain and palpitations.  Gastrointestinal: Positive for nausea, vomiting and abdominal pain. Negative for diarrhea and constipation.  Genitourinary: Positive for flank pain. Negative for dysuria, urgency, hematuria, vaginal bleeding, vaginal discharge, vaginal pain and pelvic pain.  Musculoskeletal: Positive for back pain. Negative for myalgias.  Skin: Negative for rash.  Neurological: Negative for dizziness, weakness, light-headedness and headaches.  Hematological: Negative for adenopathy. Does not bruise/bleed easily.      Allergies  Codeine and Zofran  Home Medications   Prior to Admission medications   Medication Sig Start Date End Date Taking? Authorizing Provider  acetaminophen (TYLENOL) 500 MG tablet Take 500 mg by mouth every 6 (six) hours as needed for mild pain or moderate pain.   Yes Historical Provider, MD   BP 133/79 mmHg  Pulse 80  Temp(Src) 97.9 F (36.6 C) (Oral)  Resp 20  Ht 5\' 4"  (1.626 m)  Wt 280 lb (127.007 kg)  BMI 48.04 kg/m2  SpO2 100%  LMP  (  Within Weeks) Physical Exam  Constitutional: She is oriented to person, place, and time. She appears well-developed and well-nourished. No distress.  HENT:  Head: Normocephalic and atraumatic.  Right Ear: External ear normal.  Left Ear: External ear normal.  Nose: Nose normal.  Mouth/Throat: Oropharynx is clear and moist. No oropharyngeal exudate.  Eyes: EOM are normal. Pupils are equal, round, and reactive to light.  Neck: Normal range of motion. Neck supple.  Cardiovascular: Normal rate, regular rhythm, normal heart sounds and intact distal  pulses.   No murmur heard. Pulmonary/Chest: Effort normal. No respiratory distress. She has no wheezes. She has no rales.  Abdominal: Soft. She exhibits no distension. There is no tenderness. There is no CVA tenderness.  Musculoskeletal: Normal range of motion. She exhibits no edema or tenderness.  Neurological: She is alert and oriented to person, place, and time.  Skin: Skin is warm and dry. No rash noted. She is not diaphoretic.  Vitals reviewed.   ED Course  Procedures (including critical care time) Labs Review Labs Reviewed  COMPREHENSIVE METABOLIC PANEL - Abnormal; Notable for the following:    Glucose, Bld 128 (*)    Calcium 8.2 (*)    All other components within normal limits  URINALYSIS, ROUTINE W REFLEX MICROSCOPIC (NOT AT [facility]) - Abnormal; Notable for the following:    Specific Gravity, Urine >1.030 (*)    Hgb urine dipstick SMALL (*)    All other components within normal limits  URINE MICROSCOPIC-ADD ON - Abnormal; Notable for the following:    Squamous Epithelial / LPF FEW (*)    Bacteria, UA FEW (*)    All other components within normal limits  CBC WITH DIFFERENTIAL/PLATELET  PREGNANCY, URINE    Imaging Review Ct Renal Stone Study  01/22/2015   CLINICAL DATA:  Left flank pain, onset this morning. History of kidney stones. Nausea and vomiting.  EXAM: CT ABDOMEN AND PELVIS WITHOUT CONTRAST  TECHNIQUE: Multidetector CT imaging of the abdomen and pelvis was performed following the standard protocol without IV contrast.  COMPARISON:  CT 02/28/2012  FINDINGS: The included lung bases are clear.  3 mm stone in the left proximal ureter with mild proximal hydronephrosis. No significant perinephric stranding. At least 3 additional punctate nonobstructing stones are seen in the left kidney. At least 3 nonobstructing stones in the right kidney, no right hydronephrosis or hydroureter. Bladder is physiologically distended without stone.  Evaluation of the remaining solid and hollow  viscera is limited given lack of contrast. The unenhanced liver is unremarkable. There is splenomegaly, spleen measures 14.3 cm in cranial caudal dimension. Gallbladder, pancreas, and adrenal glands are normal.  The stomach is distended with ingested contents. There are no dilated or thickened bowel loops. The appendix is normal. Small volume of stool throughout the colon without colonic wall thickening. No free air, free fluid, or intra-abdominal fluid collection.  No retroperitoneal adenopathy. Abdominal aorta is normal in caliber.  Within the pelvis the urinary bladder is physiologically distended without stone. The uterus and adnexa are normal for age. Ovaries are symmetric in size. Intrauterine device on prior exam is no longer seen. No pelvic free fluid.  There are no acute or suspicious osseous abnormalities.  IMPRESSION: 1. Partially obstructing 3 mm stone in the left proximal ureter with mild hydronephrosis. 2. Additional nonobstructing stones in both kidneys. 3. Mild splenomegaly.   Electronically Signed   By: Jeb Levering M.D.   On: 01/22/2015 22:25   I have personally reviewed and evaluated these images  and lab results as part of my medical decision-making.   EKG Interpretation None      MDM  Patient seen and evaluated in stable condition.  Well appearing.  UA with blood and few bacteria but no other signs of infection and noted to have bacteria on all previous urine samples and there are no other signs of infection and patient refusing straight cath.  This felt not to be an infected urine.  Specific gravity elevated on UA.  Other labs unremarkable for patient.  CT with 3 mm mildly obstructing stone at the UVJ.  Patient given IM Toradol, dilaudid, reglan.  Patient was a difficulty IV start and asked that IV not be attempted again.  Patient tolerated PO challenge and requested discharge.  She was discharged to follow up outpatient with prescriptions for Norco and Phenergan.  Strict return  precautions given.   Final diagnoses:  Left flank pain    1. Left renal calculus    Harvel Quale, MD 01/24/15 [phone]

## 2015-01-25 ENCOUNTER — Encounter (HOSPITAL_COMMUNITY): Payer: Self-pay | Admitting: *Deleted

## 2015-01-25 ENCOUNTER — Emergency Department (HOSPITAL_COMMUNITY): Payer: BC Managed Care – PPO

## 2015-01-25 ENCOUNTER — Emergency Department (HOSPITAL_COMMUNITY)
Admission: EM | Admit: 2015-01-25 | Discharge: 2015-01-25 | Disposition: A | Discharge status: Home / Self Care | Payer: BC Managed Care – PPO | Attending: Emergency Medicine | Admitting: Emergency Medicine

## 2015-01-25 DIAGNOSIS — Z8659 Personal history of other mental and behavioral disorders: Secondary | ICD-10-CM | POA: Insufficient documentation

## 2015-01-25 DIAGNOSIS — E669 Obesity, unspecified: Secondary | ICD-10-CM | POA: Insufficient documentation

## 2015-01-25 DIAGNOSIS — N200 Calculus of kidney: Secondary | ICD-10-CM | POA: Insufficient documentation

## 2015-01-25 DIAGNOSIS — Z862 Personal history of diseases of the blood and blood-forming organs and certain disorders involving the immune mechanism: Secondary | ICD-10-CM | POA: Insufficient documentation

## 2015-01-25 LAB — CBC WITH DIFFERENTIAL/PLATELET
Basophils Absolute: 0 10*3/uL (ref 0.0–0.1)
Basophils Relative: 0 %
Eosinophils Absolute: 0.1 10*3/uL (ref 0.0–0.7)
Eosinophils Relative: 1 %
HCT: 40.5 % (ref 36.0–46.0)
Hemoglobin: 13.6 g/dL (ref 12.0–15.0)
Lymphocytes Relative: 27 %
Lymphs Abs: 1.8 10*3/uL (ref 0.7–4.0)
MCH: 29.6 pg (ref 26.0–34.0)
MCHC: 33.6 g/dL (ref 30.0–36.0)
MCV: 88 fL (ref 78.0–100.0)
Monocytes Absolute: 0.4 10*3/uL (ref 0.1–1.0)
Monocytes Relative: 6 %
Neutro Abs: 4.4 10*3/uL (ref 1.7–7.7)
Neutrophils Relative %: 66 %
Platelets: 183 10*3/uL (ref 150–400)
RBC: 4.6 MIL/uL (ref 3.87–5.11)
RDW: 12.1 % (ref 11.5–15.5)
WBC: 6.6 10*3/uL (ref 4.0–10.5)

## 2015-01-25 LAB — COMPREHENSIVE METABOLIC PANEL
ALT: 28 U/L (ref 14–54)
AST: 24 U/L (ref 15–41)
Albumin: 3.7 g/dL (ref 3.5–5.0)
Alkaline Phosphatase: 82 U/L (ref 38–126)
Anion gap: 7 (ref 5–15)
BUN: 10 mg/dL (ref 6–20)
CO2: 27 mmol/L (ref 22–32)
Calcium: 8.7 mg/dL — ABNORMAL LOW (ref 8.9–10.3)
Chloride: 105 mmol/L (ref 101–111)
Creatinine, Ser: 0.82 mg/dL (ref 0.44–1.00)
GFR calc Af Amer: >60 mL/min (ref >60)
GFR calc non Af Amer: >60 mL/min (ref >60)
Glucose, Bld: 90 mg/dL (ref 65–99)
Potassium: 4.3 mmol/L (ref 3.5–5.1)
Sodium: 139 mmol/L (ref 135–145)
Total Bilirubin: 0.5 mg/dL (ref 0.3–1.2)
Total Protein: 6.8 g/dL (ref 6.5–8.1)

## 2015-01-25 LAB — URINALYSIS, ROUTINE W REFLEX MICROSCOPIC
Bilirubin Urine: NEGATIVE
Glucose, UA: NEGATIVE mg/dL
Ketones, ur: NEGATIVE mg/dL
Leukocytes, UA: NEGATIVE
Nitrite: NEGATIVE
Protein, ur: NEGATIVE mg/dL
Specific Gravity, Urine: 1.02 (ref 1.005–1.030)
Urobilinogen, UA: 0.2 mg/dL (ref 0.0–1.0)
pH: 6 (ref 5.0–8.0)

## 2015-01-25 LAB — URINE MICROSCOPIC-ADD ON

## 2015-01-25 MED ORDER — KETOROLAC TROMETHAMINE 30 MG/ML IJ SOLN
30.0000 mg | Freq: Once | INTRAMUSCULAR | Status: DC
  Filled 2015-01-25: qty 1

## 2015-01-25 MED ORDER — ONDANSETRON 4 MG PO TBDP
4.0000 mg | ORAL_TABLET | Freq: Once | ORAL | Status: AC
  Administered 2015-01-25: 4 mg via ORAL

## 2015-01-25 MED ORDER — ONDANSETRON 4 MG PO TBDP
ORAL_TABLET | ORAL | Status: AC
  Filled 2015-01-25: qty 1

## 2015-01-25 MED ORDER — TAMSULOSIN HCL 0.4 MG PO CAPS: 0.4000 mg | ORAL_CAPSULE | Freq: Every day | ORAL | Status: DC

## 2015-01-25 MED ORDER — CEPHALEXIN 500 MG PO CAPS: 500.0000 mg | ORAL_CAPSULE | Freq: Four times a day (QID) | ORAL | Status: DC

## 2015-01-25 MED ORDER — KETOROLAC TROMETHAMINE 60 MG/2ML IM SOLN
INTRAMUSCULAR | Status: AC
  Filled 2015-01-25: qty 2

## 2015-01-25 MED ORDER — KETOROLAC TROMETHAMINE 60 MG/2ML IM SOLN
60.0000 mg | Freq: Once | INTRAMUSCULAR | Status: AC
  Administered 2015-01-25: 60 mg via INTRAMUSCULAR

## 2015-01-25 MED ORDER — ONDANSETRON HCL 4 MG/2ML IJ SOLN
4.0000 mg | Freq: Once | INTRAMUSCULAR | Status: DC
  Filled 2015-01-25: qty 2

## 2015-01-25 NOTE — ED Provider Notes (Signed)
CSN: [id number]     Arrival date & time 01/25/15  6433 History  By signing my name below, I, Meriel Pica, attest that this documentation has been prepared under the direction and in the presence of Milton Ferguson, MD. Electronically Signed: Meriel Pica, ED Scribe. 01/25/2015. 11:49 AM.   Chief Complaint  Patient presents with  . Flank Pain   Patient is a 24 y.o. female presenting with flank pain. The history is provided by the patient. No language interpreter was used.  Flank Pain This is a new problem. The current episode started more than 2 days ago. The problem occurs constantly. The problem has not changed since onset.Associated symptoms include abdominal pain ( LLQ). Pertinent negatives include no chest pain and no headaches. Nothing aggravates the symptoms. Nothing relieves the symptoms. She has tried nothing for the symptoms. The treatment provided no relief.   HPI Comments: Janice Taylor is a 24 y.o. female, with a PMhx of left renal calculus, who presents to the Emergency Department complaining of constant left flank pain that has remained unchanged since onset 4 days ago secondary to 37mm stone visualized in left proximal ureter. She does note radiation of the pain to LLQ. Pt reports an associated fever with a Tmax of 102F last night prompting ED evaluation today; pt took tylenol at onset of fever. No current fever. The pt was evaluated 4 days ago in the ED for sharp left flank pain. A CT renal study was obtained on this visit and the pt was found to have 75mm mildly obstructing stone in the left proximal ureter. She was discharged with prescriptions for Norco and Phenergan which she did not fill, stating 'I do not like pain medication.'   Past Medical History  Diagnosis Date  . ADHD (attention deficit hyperactivity disorder)   . Depression   . Anxiety   . Thrombocytopenia ([place]) 11/25/2011    Consistent with ITP  . Clotting disorder ([place])   . Cough   . Leg swelling   . 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 Obesity 01/05/2013   Past Surgical History  Procedure Laterality Date  . Tonsilectomy, adenoidectomy, bilateral myringotomy and tubes    . Tonsillectomy     Family History  Problem Relation Age of Onset  . Diabetes Maternal Uncle   . Kidney disease Maternal Uncle     renal failure/dialysis  . Heart attack Maternal Grandmother    Social History  Substance Use Topics  . Smoking status: Never Smoker   . Smokeless tobacco: Never Used  . Alcohol Use: No   OB History    Gravida Para Term Preterm AB TAB SAB Ectopic Multiple Living   1 1 1             Review of Systems  Constitutional: Negative for fever, appetite change and fatigue.  HENT: Negative for congestion, ear discharge and sinus pressure.   Eyes: Negative for discharge.  Respiratory: Negative for cough.   Cardiovascular: Negative for chest pain.  Gastrointestinal: Positive for abdominal pain ( LLQ). Negative for diarrhea.  Genitourinary: Positive for flank pain. Negative for frequency and hematuria.  Musculoskeletal: Negative for back pain.  Skin: Negative for rash.  Neurological: Negative for seizures and headaches.  Psychiatric/Behavioral: Negative for hallucinations.   Allergies  Codeine and Zofran  Home Medications   Prior to Admission medications   Medication Sig Start Date End Date Taking? Authorizing Provider  acetaminophen (TYLENOL) 500 MG tablet Take 500  mg by mouth every 6 (six) hours as needed for mild pain or moderate pain.    Historical Provider, MD  HYDROcodone-acetaminophen (NORCO) 7.5-325 MG tablet Take 1 tablet by mouth every 4 (four) hours as needed for moderate pain. 01/22/15   Harvel Quale, MD  promethazine (PHENERGAN) 25 MG tablet Take 1 tablet (25 mg total) by mouth every 6 (six) hours as needed for nausea or vomiting. 01/22/15   Harvel Quale, MD   BP 135/66 mmHg  Pulse 84  Temp(Src) 98.3 F (36.8 C) (Oral)   Resp 18  Ht 5\' 4"  (1.626 m)  Wt 280 lb (127.007 kg)  BMI 48.04 kg/m2  SpO2 100%  LMP  (Within Weeks) Physical Exam  Constitutional: She is oriented to person, place, and time. She appears well-developed.  HENT:  Head: Normocephalic.  Eyes: Conjunctivae and EOM are normal. No scleral icterus.  Neck: Neck supple. No thyromegaly present.  Cardiovascular: Normal rate and regular rhythm.  Exam reveals no gallop and no friction rub.   No murmur heard. Pulmonary/Chest: No stridor. She has no wheezes. She has no rales. She exhibits no tenderness.  Abdominal: She exhibits no distension. There is tenderness. There is no rebound.  Minimal LLQ tenderness.   Musculoskeletal: Normal range of motion. She exhibits no edema.  Lymphadenopathy:    She has no cervical adenopathy.  Neurological: She is oriented to person, place, and time. She exhibits normal muscle tone. Coordination normal.  Skin: No rash noted. No erythema.  Psychiatric: She has a normal mood and affect. Her behavior is normal.    ED Course  Procedures  DIAGNOSTIC STUDIES: Oxygen Saturation is 100% on RA, normal by my interpretation.    COORDINATION OF CARE: 10:30 AM Discussed treatment plan with pt at bedside and pt agreed to plan.   Labs Review Labs Reviewed  URINALYSIS, ROUTINE W REFLEX MICROSCOPIC (NOT AT [facility]) - Abnormal; Notable for the following:    APPearance HAZY (*)    Hgb urine dipstick SMALL (*)    All other components within normal limits  COMPREHENSIVE METABOLIC PANEL - Abnormal; Notable for the following:    Calcium 8.7 (*)    All other components within normal limits  URINE MICROSCOPIC-ADD ON - Abnormal; Notable for the following:    Squamous Epithelial / LPF MANY (*)    Bacteria, UA FEW (*)    All other components within normal limits  CBC WITH DIFFERENTIAL/PLATELET  Dg Abd 1 View  01/25/2015   CLINICAL DATA:  Abdominal pain.  History of kidney stones  EXAM: ABDOMEN - 1 VIEW  COMPARISON:  CT abdomen  01/22/2015  FINDINGS: Small stone proximal left ureter is faintly radiopaque on CT and not visualized on the x-ray. This was located at the L3 level on CT  Small bilateral renal calculi identified on CT not identified on the current study.  Normal bowel gas pattern.  No focal bony abnormality.  IMPRESSION: Small renal calculi identified on recent CT are not visualized on the current x-ray. No acute abnormality.   Electronically Signed   By: Franchot Gallo M.D.   On: 01/25/2015 11:45   Ct Renal Stone Study  01/22/2015   CLINICAL DATA:  Left flank pain, onset this morning. History of kidney stones. Nausea and vomiting.  EXAM: CT ABDOMEN AND PELVIS WITHOUT CONTRAST  TECHNIQUE: Multidetector CT imaging of the abdomen and pelvis was performed following the standard protocol without IV contrast.  COMPARISON:  CT 02/28/2012  FINDINGS: The included lung bases  are clear.  3 mm stone in the left proximal ureter with mild proximal hydronephrosis. No significant perinephric stranding. At least 3 additional punctate nonobstructing stones are seen in the left kidney. At least 3 nonobstructing stones in the right kidney, no right hydronephrosis or hydroureter. Bladder is physiologically distended without stone.  Evaluation of the remaining solid and hollow viscera is limited given lack of contrast. The unenhanced liver is unremarkable. There is splenomegaly, spleen measures 14.3 cm in cranial caudal dimension. Gallbladder, pancreas, and adrenal glands are normal.  The stomach is distended with ingested contents. There are no dilated or thickened bowel loops. The appendix is normal. Small volume of stool throughout the colon without colonic wall thickening. No free air, free fluid, or intra-abdominal fluid collection.  No retroperitoneal adenopathy. Abdominal aorta is normal in caliber.  Within the pelvis the urinary bladder is physiologically distended without stone. The uterus and adnexa are normal for age. Ovaries are  symmetric in size. Intrauterine device on prior exam is no longer seen. No pelvic free fluid.  There are no acute or suspicious osseous abnormalities.  IMPRESSION: 1. Partially obstructing 3 mm stone in the left proximal ureter with mild hydronephrosis. 2. Additional nonobstructing stones in both kidneys. 3. Mild splenomegaly.   Electronically Signed   By: Jeb Levering M.D.   On: 01/22/2015 22:25   I have personally reviewed and evaluated these images and lab results as part of my medical decision-making.   MDM   Final diagnoses:  None    Patient with kidney stone she has not been taking any of her pain medicine. I convinced her to take her pain medicine if needed we will culture her urine and start on some antibiotics she is referred to urology.The chart was scribed for me under my direct supervision.  I personally performed the history, physical, and medical decision making and all procedures in the evaluation of this patient.Milton Ferguson, MD 01/25/15 [phone]

## 2015-01-25 NOTE — Discharge Instructions (Signed)
Follow up with [facility] next week °

## 2015-01-25 NOTE — ED Notes (Signed)
Pt was recently seen here for dx of kidney stone and told if she had fevers to come back. Pt began having fevers yesterday and has been taking tylenol to keep them down. Pt states pain hasn't changed and she feels no movement of the stone. NAD noted.

## 2015-01-27 LAB — URINE CULTURE

## 2015-03-11 ENCOUNTER — Encounter (HOSPITAL_COMMUNITY): Payer: Self-pay | Admitting: Emergency Medicine

## 2015-03-11 ENCOUNTER — Emergency Department (HOSPITAL_COMMUNITY)
Admission: EM | Admit: 2015-03-11 | Discharge: 2015-03-11 | Disposition: A | Discharge status: Home / Self Care | Payer: BC Managed Care – PPO | Attending: Emergency Medicine | Admitting: Emergency Medicine

## 2015-03-11 DIAGNOSIS — Z8739 Personal history of other diseases of the musculoskeletal system and connective tissue: Secondary | ICD-10-CM | POA: Insufficient documentation

## 2015-03-11 DIAGNOSIS — Z862 Personal history of diseases of the blood and blood-forming organs and certain disorders involving the immune mechanism: Secondary | ICD-10-CM | POA: Insufficient documentation

## 2015-03-11 DIAGNOSIS — F329 Major depressive disorder, single episode, unspecified: Secondary | ICD-10-CM | POA: Insufficient documentation

## 2015-03-11 DIAGNOSIS — R110 Nausea: Secondary | ICD-10-CM | POA: Insufficient documentation

## 2015-03-11 DIAGNOSIS — Z3A01 Less than 8 weeks gestation of pregnancy: Secondary | ICD-10-CM | POA: Insufficient documentation

## 2015-03-11 DIAGNOSIS — F909 Attention-deficit hyperactivity disorder, unspecified type: Secondary | ICD-10-CM | POA: Insufficient documentation

## 2015-03-11 DIAGNOSIS — E669 Obesity, unspecified: Secondary | ICD-10-CM | POA: Insufficient documentation

## 2015-03-11 DIAGNOSIS — O039 Complete or unspecified spontaneous abortion without complication: Secondary | ICD-10-CM

## 2015-03-11 LAB — WET PREP, GENITAL
Sperm: NONE SEEN
Trich, Wet Prep: NONE SEEN
Yeast Wet Prep HPF POC: NONE SEEN

## 2015-03-11 LAB — CBC WITH DIFFERENTIAL/PLATELET
Basophils Absolute: 0 10*3/uL (ref 0.0–0.1)
Basophils Relative: 0 %
Eosinophils Absolute: 0.1 10*3/uL (ref 0.0–0.7)
Eosinophils Relative: 1 %
HCT: 40.3 % (ref 36.0–46.0)
Hemoglobin: 13.4 g/dL (ref 12.0–15.0)
Lymphocytes Relative: 21 %
Lymphs Abs: 1.7 10*3/uL (ref 0.7–4.0)
MCH: 29.4 pg (ref 26.0–34.0)
MCHC: 33.3 g/dL (ref 30.0–36.0)
MCV: 88.4 fL (ref 78.0–100.0)
Monocytes Absolute: 0.4 10*3/uL (ref 0.1–1.0)
Monocytes Relative: 5 %
Neutro Abs: 6.1 10*3/uL (ref 1.7–7.7)
Neutrophils Relative %: 73 %
Platelets: 187 10*3/uL (ref 150–400)
RBC: 4.56 MIL/uL (ref 3.87–5.11)
RDW: 12.2 % (ref 11.5–15.5)
WBC: 8.2 10*3/uL (ref 4.0–10.5)

## 2015-03-11 LAB — BASIC METABOLIC PANEL
Anion gap: 8 (ref 5–15)
BUN: 10 mg/dL (ref 6–20)
CO2: 25 mmol/L (ref 22–32)
Calcium: 8.6 mg/dL — ABNORMAL LOW (ref 8.9–10.3)
Chloride: 106 mmol/L (ref 101–111)
Creatinine, Ser: 0.87 mg/dL (ref 0.44–1.00)
GFR calc Af Amer: >60 mL/min (ref >60)
GFR calc non Af Amer: >60 mL/min (ref >60)
Glucose, Bld: 97 mg/dL (ref 65–99)
Potassium: 3.6 mmol/L (ref 3.5–5.1)
Sodium: 139 mmol/L (ref 135–145)

## 2015-03-11 LAB — ABO/RH: ABO/RH(D): A POS

## 2015-03-11 LAB — HCG, QUANTITATIVE, PREGNANCY: hCG, Beta Chain, Quant, S: <1 m[IU]/mL (ref <5)

## 2015-03-11 LAB — POC URINE PREG, ED: Preg Test, Ur: NEGATIVE

## 2015-03-11 MED ORDER — IBUPROFEN 800 MG PO TABS
ORAL_TABLET | ORAL | Status: AC
  Administered 2015-03-11: 800 mg via ORAL
  Filled 2015-03-11: qty 1

## 2015-03-11 MED ORDER — IBUPROFEN 800 MG PO TABS
800.0000 mg | ORAL_TABLET | Freq: Once | ORAL | Status: AC
  Administered 2015-03-11: 800 mg via ORAL

## 2015-03-11 MED ORDER — PROMETHAZINE HCL 25 MG PO TABS: 25.0000 mg | ORAL_TABLET | Freq: Four times a day (QID) | ORAL | Status: DC | PRN

## 2015-03-11 MED ORDER — PROMETHAZINE HCL 12.5 MG PO TABS
25.0000 mg | ORAL_TABLET | Freq: Once | ORAL | Status: AC
  Administered 2015-03-11: 25 mg via ORAL
  Filled 2015-03-11: qty 2

## 2015-03-11 NOTE — ED Notes (Addendum)
Pt states that she had a positive pregnancy test on Friday and started having heavy vaginal bleeding with clots this morning.  Lower abdominal pain.  Also c/o vomiting.

## 2015-03-11 NOTE — ED Notes (Signed)
Patient with no complaints at this time. Respirations even and unlabored. Skin warm/dry. Discharge instructions reviewed with patient at this time. Patient given opportunity to voice concerns/ask questions. Patient discharged at this time and left Emergency Department with steady gait.   

## 2015-03-11 NOTE — Discharge Instructions (Signed)
Miscarriage  A miscarriage is the sudden loss of an unborn baby (fetus) before the 20th week of pregnancy. Most miscarriages happen in the first 3 months of pregnancy. Sometimes, it happens before a woman even knows she is pregnant. A miscarriage is also called a "spontaneous miscarriage" or "early pregnancy loss." Having a miscarriage can be an emotional experience. Talk with your caregiver about any questions you may have about miscarrying, the grieving process, and your future pregnancy plans.  CAUSES    Problems with the fetal chromosomes that make it impossible for the baby to develop normally. Problems with the baby's genes or chromosomes are most often the result of errors that occur, by chance, as the embryo divides and grows. The problems are not inherited from the parents.   Infection of the cervix or uterus.    Hormone problems.    Problems with the cervix, such as having an incompetent cervix. This is when the tissue in the cervix is not strong enough to hold the pregnancy.    Problems with the uterus, such as an abnormally shaped uterus, uterine fibroids, or congenital abnormalities.    Certain medical conditions.    Smoking, drinking alcohol, or taking illegal drugs.    Trauma.   Often, the cause of a miscarriage is unknown.   SYMPTOMS    Vaginal bleeding or spotting, with or without cramps or pain.   Pain or cramping in the abdomen or lower back.   Passing fluid, tissue, or blood clots from the vagina.  DIAGNOSIS   Your caregiver will perform a physical exam. You may also have an ultrasound to confirm the miscarriage. Blood or urine tests may also be ordered.  TREATMENT    Sometimes, treatment is not necessary if you naturally pass all the fetal tissue that was in the uterus. If some of the fetus or placenta remains in the body (incomplete miscarriage), tissue left behind may become infected and must be removed. Usually, a dilation and curettage (D and C) procedure is performed.  During a D and C procedure, the cervix is widened (dilated) and any remaining fetal or placental tissue is gently removed from the uterus.   Antibiotic medicines are prescribed if there is an infection. Other medicines may be given to reduce the size of the uterus (contract) if there is a lot of bleeding.   If you have Rh negative blood and your baby was Rh positive, you will need a Rh immunoglobulin shot. This shot will protect any future baby from having Rh blood problems in future pregnancies.  HOME CARE INSTRUCTIONS    Your caregiver may order bed rest or may allow you to continue light activity. Resume activity as directed by your caregiver.   Have someone help with home and family responsibilities during this time.    Keep track of the number of sanitary pads you use each day and how soaked (saturated) they are. Write down this information.    Do not use tampons. Do not douche or have sexual intercourse until approved by your caregiver.    Only take over-the-counter or prescription medicines for pain or discomfort as directed by your caregiver.    Do not take aspirin. Aspirin can cause bleeding.    Keep all follow-up appointments with your caregiver.    If you or your partner have problems with grieving, talk to your caregiver or seek counseling to help cope with the pregnancy loss. Allow enough time to grieve before trying to get pregnant again.     SEEK IMMEDIATE MEDICAL CARE IF:    You have severe cramps or pain in your back or abdomen.   You have a fever.   You pass large blood clots (walnut-sized or larger) ortissue from your vagina. Save any tissue for your caregiver to inspect.    Your bleeding increases.    You have a thick, bad-smelling vaginal discharge.   You become lightheaded, weak, or you faint.    You have chills.   MAKE SURE YOU:   Understand these instructions.   Will watch your condition.   Will get help right away if you are not doing well or get worse.     This  information is not intended to replace advice given to you by your health care provider. Make sure you discuss any questions you have with your health care provider.     Document Released: 10/06/2000 Document Revised: 08/07/2012 Document Reviewed: 06/01/2011  Elsevier Interactive Patient Education 2016 [employer]

## 2015-03-11 NOTE — ED Provider Notes (Signed)
CSN: [id number]     Arrival date & time 03/11/15  1153 History  By signing my name below, I, Terressa Koyanagi, attest that this documentation has been prepared under the direction and in the presence of Davonna Belling, MD. Electronically Signed: Terressa Koyanagi, ED Scribe. 03/11/2015. 12:29 PM.   Chief Complaint  Patient presents with  . Vaginal Bleeding   The history is provided by the patient. No language interpreter was used.   PCP: Purvis Kilts, MD HPI Comments: Reynelda Arredondo is a 24 y.o. female, with PMHx noted below including clotting disorder and G1P1000, who presents to the Emergency Department complaining of heavy vaginal bleeding onset this morning. Pt describes the bleeding as being black in color and "stringy."  Pt endorses associated nausea. Pt reports a positive pregnancy test 4 days ago noting her menstrual cycle was two weeks late when she took the pregnancy test.   Past Medical History  Diagnosis Date  . ADHD (attention deficit hyperactivity disorder)   . Depression   . Anxiety   . Thrombocytopenia ([place]) 11/25/2011    Consistent with ITP  . Clotting disorder ([place])   . Cough   . Leg swelling   . 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 Obesity 01/05/2013   Past Surgical History  Procedure Laterality Date  . Tonsilectomy, adenoidectomy, bilateral myringotomy and tubes    . Tonsillectomy     Family History  Problem Relation Age of Onset  . Diabetes Maternal Uncle   . Kidney disease Maternal Uncle     renal failure/dialysis  . Heart attack Maternal Grandmother    Social History  Substance Use Topics  . Smoking status: Never Smoker   . Smokeless tobacco: Never Used  . Alcohol Use: No   OB History    Gravida Para Term Preterm AB TAB SAB Ectopic Multiple Living   1 1 1             Review of Systems  Constitutional: Negative for activity change and appetite change.  Eyes: Negative for pain.  Respiratory: Negative for  chest tightness and shortness of breath.   Cardiovascular: Negative for chest pain and leg swelling.  Gastrointestinal: Positive for nausea. Negative for vomiting, abdominal pain and diarrhea.  Genitourinary: Positive for vaginal bleeding. Negative for flank pain.  Musculoskeletal: Negative for back pain and neck stiffness.  Skin: Negative for rash.  Neurological: Negative for weakness, numbness and headaches.  Psychiatric/Behavioral: Negative for behavioral problems.   Allergies  Codeine and Zofran  Home Medications   Prior to Admission medications   Medication Sig Start Date End Date Taking? Authorizing Provider  levonorgestrel-ethinyl estradiol (SRONYX) 0.1-20 MG-MCG tablet Take 1 tablet by mouth daily.   Yes Historical Provider, MD  lisdexamfetamine (VYVANSE) 30 MG capsule Take 30 mg by mouth daily.   Yes Historical Provider, MD  Prenatal Vit-Fe Fumarate-FA (PRENATAL PO) Take 2 capsules by mouth daily.   Yes Historical Provider, MD  acetaminophen (TYLENOL) 500 MG tablet Take 500 mg by mouth every 6 (six) hours as needed for mild pain or moderate pain.    Historical Provider, MD  cephALEXin (KEFLEX) 500 MG capsule Take 1 capsule (500 mg total) by mouth 4 (four) times daily. Patient not taking: Reported on 03/11/2015 01/25/15   Milton Ferguson, MD  HYDROcodone-acetaminophen [facility]) 7.5-325 MG tablet Take 1 tablet by mouth every 4 (four) hours as needed for moderate pain. Patient not taking: Reported on 03/11/2015 01/22/15   Dickie La  Alfonse Spruce, MD  promethazine (PHENERGAN) 25 MG tablet Take 1 tablet (25 mg total) by mouth every 6 (six) hours as needed for nausea. 03/11/15   Davonna Belling, MD  tamsulosin (FLOMAX) 0.4 MG CAPS capsule Take 1 capsule (0.4 mg total) by mouth daily. Patient not taking: Reported on 03/11/2015 01/25/15   Milton Ferguson, MD   Triage Vitals: BP 140/79 mmHg  Pulse 93  Temp(Src) 98.4 F (36.9 C) (Oral)  Resp 16  Ht 5\' 3"  (1.6 m)  Wt 280 lb (127.007 kg)  BMI 49.61  kg/m2  SpO2 99%  LMP 01/31/2015 Physical Exam  Constitutional: She is oriented to person, place, and time. She appears well-developed and well-nourished.  Obese   HENT:  Head: Normocephalic and atraumatic.  Eyes: EOM are normal. Pupils are equal, round, and reactive to light.  Neck: Normal range of motion. Neck supple.  Cardiovascular: Normal rate, regular rhythm and normal heart sounds.   No murmur heard. Pulmonary/Chest: Effort normal and breath sounds normal. No respiratory distress. She has no wheezes. She has no rales.  Abdominal: Soft. Bowel sounds are normal. She exhibits no distension. There is no tenderness. There is no rebound and no guarding.  Musculoskeletal: Normal range of motion.  Neurological: She is alert and oriented to person, place, and time. No cranial nerve deficit.  Skin: Skin is warm and dry.  Psychiatric: She has a normal mood and affect. Her speech is normal.  Nursing note and vitals reviewed. Exam performed by Davonna Belling, MD,  exam chaperoned Date: 03/11/2015 Pelvic exam: normal external genitalia without evidence of trauma. VULVA: normal appearing vulva with no masses, tenderness or lesion. VAGINA: normal appearing vagina with normal color and discharge, no lesions. CERVIX: normal appearing cervix without lesions, cervical motion tenderness absent, cervical os closed with out purulent discharge; vaginal discharge - dark and bloody ADNEXA: normal adnexa in size, nontender and no masses UTERUS: uterus is normal size, shape, consistency and nontender.   ED Course  Procedures (including critical care time) DIAGNOSTIC STUDIES: Oxygen Saturation is 99% on ra, nl by my interpretation.    COORDINATION OF CARE: 12:26 PM: Discussed treatment plan which includes pelvic exam, [place], labs with pt at bedside; patient verbalizes understanding and agrees with treatment plan.  Labs Review Labs Reviewed  WET PREP, GENITAL - Abnormal; Notable for the following:     Clue Cells Wet Prep HPF POC PRESENT (*)    WBC, Wet Prep HPF POC FEW (*)    All other components within normal limits  BASIC METABOLIC PANEL - Abnormal; Notable for the following:    Calcium 8.6 (*)    All other components within normal limits  HCG, QUANTITATIVE, PREGNANCY  CBC WITH DIFFERENTIAL/PLATELET  RPR  HIV ANTIBODY (ROUTINE TESTING)  POC URINE PREG, ED  ABO/RH  GC/CHLAMYDIA PROBE AMP ([place]) NOT AT [facility]   I have personally reviewed and evaluated these lab results as part of my medical decision-making.    MDM   Final diagnoses:  Miscarriage     patient with apparent miscarriage. Had positive pregnancy tests but negative now. Will discharge home to follow with her gynecologist. I personally performed the services described in this documentation, which was scribed in my presence. The recorded information has been reviewed and is accurate.       Davonna Belling, MD 03/11/15 2236

## 2015-03-12 LAB — GC/CHLAMYDIA PROBE AMP (~~LOC~~) NOT AT ARMC
Chlamydia: NEGATIVE
Neisseria Gonorrhea: NEGATIVE

## 2015-03-13 LAB — HIV ANTIBODY (ROUTINE TESTING W REFLEX): HIV Screen 4th Generation wRfx: NONREACTIVE

## 2015-03-14 LAB — RPR: RPR Ser Ql: NONREACTIVE

## 2015-08-22 ENCOUNTER — Encounter (HOSPITAL_COMMUNITY): Payer: Self-pay

## 2015-08-22 ENCOUNTER — Emergency Department (HOSPITAL_COMMUNITY)
Admission: EM | Admit: 2015-08-22 | Discharge: 2015-08-23 | Disposition: A | Discharge status: Home / Self Care | Payer: BC Managed Care – PPO | Attending: Emergency Medicine | Admitting: Emergency Medicine

## 2015-08-22 DIAGNOSIS — F329 Major depressive disorder, single episode, unspecified: Secondary | ICD-10-CM | POA: Insufficient documentation

## 2015-08-22 DIAGNOSIS — E669 Obesity, unspecified: Secondary | ICD-10-CM | POA: Insufficient documentation

## 2015-08-22 DIAGNOSIS — N3010 Interstitial cystitis (chronic) without hematuria: Secondary | ICD-10-CM

## 2015-08-22 LAB — WET PREP, GENITAL
Sperm: NONE SEEN
Trich, Wet Prep: NONE SEEN
Yeast Wet Prep HPF POC: NONE SEEN

## 2015-08-22 LAB — URINALYSIS, ROUTINE W REFLEX MICROSCOPIC
Bilirubin Urine: NEGATIVE
Glucose, UA: NEGATIVE mg/dL
Ketones, ur: NEGATIVE mg/dL
Nitrite: NEGATIVE
Protein, ur: NEGATIVE mg/dL
Specific Gravity, Urine: 1.02 (ref 1.005–1.030)
pH: 5.5 (ref 5.0–8.0)

## 2015-08-22 LAB — URINE MICROSCOPIC-ADD ON

## 2015-08-22 LAB — POC URINE PREG, ED: Preg Test, Ur: NEGATIVE

## 2015-08-22 MED ORDER — PHENAZOPYRIDINE HCL 200 MG PO TABS: 200.0000 mg | ORAL_TABLET | Freq: Three times a day (TID) | ORAL | Status: DC

## 2015-08-22 MED ORDER — PROMETHAZINE HCL 25 MG PO TABS: 25.0000 mg | ORAL_TABLET | Freq: Four times a day (QID) | ORAL | Status: DC | PRN

## 2015-08-22 MED ORDER — PROMETHAZINE HCL 12.5 MG PO TABS
25.0000 mg | ORAL_TABLET | Freq: Once | ORAL | Status: AC
  Administered 2015-08-22: 25 mg via ORAL
  Filled 2015-08-22: qty 2

## 2015-08-22 MED ORDER — TRAMADOL HCL 50 MG PO TABS: 50.0000 mg | ORAL_TABLET | Freq: Four times a day (QID) | ORAL | Status: DC | PRN

## 2015-08-22 MED ORDER — TRAMADOL HCL 50 MG PO TABS
50.0000 mg | ORAL_TABLET | Freq: Once | ORAL | Status: AC
  Administered 2015-08-22: 50 mg via ORAL
  Filled 2015-08-22: qty 1

## 2015-08-22 MED ORDER — PHENAZOPYRIDINE HCL 100 MG PO TABS
200.0000 mg | ORAL_TABLET | Freq: Once | ORAL | Status: AC
Start: 2015-08-22 — End: 2015-08-22
  Administered 2015-08-22: 200 mg via ORAL
  Filled 2015-08-22: qty 2

## 2015-08-22 MED ORDER — HYDROCODONE-ACETAMINOPHEN 5-325 MG PO TABS: 1.0000 | ORAL_TABLET | ORAL | Status: DC | PRN

## 2015-08-22 MED ORDER — IBUPROFEN 400 MG PO TABS
600.0000 mg | ORAL_TABLET | Freq: Once | ORAL | Status: AC
Start: 2015-08-22 — End: 2015-08-22
  Administered 2015-08-22: 600 mg via ORAL
  Filled 2015-08-22: qty 2

## 2015-08-22 NOTE — ED Notes (Signed)
Pt states she has been dealing with UTI for two weeks first given Septra and then Bactrim with no relief. PCP told patient if no relief  To come to ED.

## 2015-08-22 NOTE — ED Provider Notes (Signed)
CSN: [id number]     Arrival date & time 08/22/15  2109 History   First MD Initiated Contact with Patient 08/22/15 2127     Chief Complaint  Patient presents with  . Urinary Tract Infection     (Consider location/radiation/quality/duration/timing/severity/associated sxs/prior Treatment) Patient is a 25 y.o. female presenting with urinary tract infection. The history is provided by the patient.  Urinary Tract Infection Quality: pressure. Onset quality:  Gradual Duration:  2 weeks Timing:  Constant Progression:  Worsening Chronicity:  New Relieved by:  Nothing Worsened by:  Nothing tried Ineffective treatments: Bactrim. Urinary symptoms: frequent urination   Risk factors: hx of urolithiasis and sexually active   Risk factors: not pregnant and no sexually transmitted infections    Mersaydes Obst is a 26 y.o. female who presents to the ED with UTI that did not resolved with Bactrim so was changed to [place].  Patient states she was told if the symptoms did not improve to go to the ED. She reports that the symptoms are worse rather than better. Hx of kidney stones but patient states this is nothing like a stone.   Past Medical History  Diagnosis Date  . ADHD (attention deficit hyperactivity disorder)   . Depression   . Anxiety   . Thrombocytopenia ([place]) 11/25/2011    Consistent with ITP  . Clotting disorder ([place])   . Cough   . Leg swelling   . 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 Obesity 01/05/2013   Past Surgical History  Procedure Laterality Date  . Tonsilectomy, adenoidectomy, bilateral myringotomy and tubes    . Tonsillectomy     Family History  Problem Relation Age of Onset  . Diabetes Maternal Uncle   . Kidney disease Maternal Uncle     renal failure/dialysis  . Heart attack Maternal Grandmother    Social History  Substance Use Topics  . Smoking status: Never Smoker   . Smokeless tobacco: Never Used  . Alcohol Use: No    OB History    Gravida Para Term Preterm AB TAB SAB Ectopic Multiple Living   1 1 1             Review of Systems Negative except as stated in HPI   Allergies  Codeine and Zofran  Home Medications   Prior to Admission medications   Medication Sig Start Date End Date Taking? Authorizing Provider  acetaminophen (TYLENOL) 500 MG tablet Take 500 mg by mouth every 6 (six) hours as needed for mild pain or moderate pain.    Historical Provider, MD  levonorgestrel-ethinyl estradiol (SRONYX) 0.1-20 MG-MCG tablet Take 1 tablet by mouth daily.    Historical Provider, MD  lisdexamfetamine (VYVANSE) 30 MG capsule Take 30 mg by mouth daily.    Historical Provider, MD  phenazopyridine (PYRIDIUM) 200 MG tablet Take 1 tablet (200 mg total) by mouth 3 (three) times daily. 08/22/15   Hope Bunnie Pion, NP  Prenatal Vit-Fe Fumarate-FA (PRENATAL PO) Take 2 capsules by mouth daily.    Historical Provider, MD  promethazine (PHENERGAN) 25 MG tablet Take 1 tablet (25 mg total) by mouth every 6 (six) hours as needed for nausea or vomiting. 08/22/15   Hope Bunnie Pion, NP  traMADol (ULTRAM) 50 MG tablet Take 1 tablet (50 mg total) by mouth every 6 (six) hours as needed. 08/22/15   Hope Bunnie Pion, NP   BP 139/83 mmHg  Pulse 87  Temp(Src) 98.1 F (36.7 C) (Oral)  Ht  5\' 4"  (1.626 m)  Wt 127.007 kg  BMI 48.04 kg/m2  SpO2 100%  LMP 07/28/2015 Physical Exam  Constitutional: She is oriented to person, place, and time. She appears well-developed and well-nourished. No distress.  HENT:  Head: Normocephalic.  Eyes: EOM are normal.  Neck: Neck supple.  Cardiovascular: Normal rate and regular rhythm.   Pulmonary/Chest: Effort normal and breath sounds normal.  Abdominal: Soft. There is tenderness in the suprapubic area. There is no rebound, no guarding and no CVA tenderness.  Genitourinary:  External genitalia without lesions, small blood vaginal vault. No CMT, no adnexal tenderness or mass palpated. Uterus without palpable  enlargement.   Musculoskeletal: Normal range of motion.  Neurological: She is alert and oriented to person, place, and time. No cranial nerve deficit.  Skin: Skin is warm and dry.  Psychiatric: She has a normal mood and affect. Her behavior is normal.  Nursing note and vitals reviewed.   ED Course  Procedures (including critical care time) Labs Review Results for orders placed or performed during the hospital encounter of 08/22/15 (from the past 24 hour(s))  Urinalysis, Routine w reflex microscopic (not at [facility])     Status: Abnormal   Collection Time: 08/22/15 10:20 PM  Result Value Ref Range   Color, Urine YELLOW YELLOW   APPearance CLEAR CLEAR   Specific Gravity, Urine 1.020 1.005 - 1.030   pH 5.5 5.0 - 8.0   Glucose, UA NEGATIVE NEGATIVE mg/dL   Hgb urine dipstick LARGE (A) NEGATIVE   Bilirubin Urine NEGATIVE NEGATIVE   Ketones, ur NEGATIVE NEGATIVE mg/dL   Protein, ur NEGATIVE NEGATIVE mg/dL   Nitrite NEGATIVE NEGATIVE   Leukocytes, UA SMALL (A) NEGATIVE  Urine microscopic-add on     Status: Abnormal   Collection Time: 08/22/15 10:20 PM  Result Value Ref Range   Squamous Epithelial / LPF 6-30 (A) NONE SEEN   WBC, UA 6-30 0 - 5 WBC/hpf   RBC / HPF 6-30 0 - 5 RBC/hpf   Bacteria, UA FEW (A) NONE SEEN  POC urine preg, ED (not at [facility])     Status: None   Collection Time: 08/22/15 10:27 PM  Result Value Ref Range   Preg Test, Ur NEGATIVE NEGATIVE  Wet prep, genital     Status: Abnormal   Collection Time: 08/22/15 11:00 PM  Result Value Ref Range   Yeast Wet Prep HPF POC NONE SEEN NONE SEEN   Trich, Wet Prep NONE SEEN NONE SEEN   Clue Cells Wet Prep HPF POC PRESENT (A) NONE SEEN   WBC, Wet Prep HPF POC FEW (A) NONE SEEN   Sperm NONE SEEN    Large blood in urine due to menses   Imaging Review No results found. I have personally reviewed and evaluated the lab results as part of my medical decision-making.   MDM  25 y.o. female with 2 week hx of UTI symptoms that have  not improved with antibiotics. Urine sent for culture. Patient will continue her Macrobid while culture pending. Discussed with the patient possibility of interstitial cystitis and need for follow up with Urology. Patient voices understanding and agrees with plan. Will treat with pyridium, hydrocodone and Phenergan. She will make appointment for f/u.  Final diagnoses:  Cystitis, interstitial      Ashley Murrain, NP 08/23/15 0000  Rolland Porter, MD 08/23/15 [phone]

## 2015-08-22 NOTE — Discharge Instructions (Signed)
You should follow up with a urologist for further evaluation of your symptoms. We have sent your urine for culture. If you need another antibiotic someone will call you.

## 2015-08-25 LAB — GC/CHLAMYDIA PROBE AMP (~~LOC~~) NOT AT ARMC
Chlamydia: NEGATIVE
Neisseria Gonorrhea: NEGATIVE

## 2015-08-25 LAB — URINE CULTURE

## 2015-09-13 ENCOUNTER — Emergency Department (HOSPITAL_COMMUNITY)
Admission: EM | Admit: 2015-09-13 | Discharge: 2015-09-13 | Disposition: A | Discharge status: Home / Self Care | Payer: Self-pay | Attending: Emergency Medicine | Admitting: Emergency Medicine

## 2015-09-13 ENCOUNTER — Encounter (HOSPITAL_COMMUNITY): Payer: Self-pay

## 2015-09-13 ENCOUNTER — Emergency Department (HOSPITAL_COMMUNITY): Payer: Self-pay

## 2015-09-13 DIAGNOSIS — F329 Major depressive disorder, single episode, unspecified: Secondary | ICD-10-CM | POA: Insufficient documentation

## 2015-09-13 DIAGNOSIS — J189 Pneumonia, unspecified organism: Secondary | ICD-10-CM | POA: Insufficient documentation

## 2015-09-13 DIAGNOSIS — Z79899 Other long term (current) drug therapy: Secondary | ICD-10-CM | POA: Insufficient documentation

## 2015-09-13 DIAGNOSIS — E669 Obesity, unspecified: Secondary | ICD-10-CM | POA: Insufficient documentation

## 2015-09-13 DIAGNOSIS — Z6841 Body Mass Index (BMI) 40.0 and over, adult: Secondary | ICD-10-CM | POA: Insufficient documentation

## 2015-09-13 LAB — CBC
HCT: 40.5 % (ref 36.0–46.0)
Hemoglobin: 13.3 g/dL (ref 12.0–15.0)
MCH: 29 pg (ref 26.0–34.0)
MCHC: 32.8 g/dL (ref 30.0–36.0)
MCV: 88.4 fL (ref 78.0–100.0)
Platelets: 181 10*3/uL (ref 150–400)
RBC: 4.58 MIL/uL (ref 3.87–5.11)
RDW: 12.3 % (ref 11.5–15.5)
WBC: 8.3 10*3/uL (ref 4.0–10.5)

## 2015-09-13 LAB — BASIC METABOLIC PANEL
Anion gap: 10 (ref 5–15)
BUN: 13 mg/dL (ref 6–20)
CO2: 23 mmol/L (ref 22–32)
Calcium: 8.7 mg/dL — ABNORMAL LOW (ref 8.9–10.3)
Chloride: 106 mmol/L (ref 101–111)
Creatinine, Ser: 0.85 mg/dL (ref 0.44–1.00)
GFR calc Af Amer: >60 mL/min (ref >60)
GFR calc non Af Amer: >60 mL/min (ref >60)
Glucose, Bld: 134 mg/dL — ABNORMAL HIGH (ref 65–99)
Potassium: 3.5 mmol/L (ref 3.5–5.1)
Sodium: 139 mmol/L (ref 135–145)

## 2015-09-13 MED ORDER — AMOXICILLIN-POT CLAVULANATE 875-125 MG PO TABS
1.0000 | ORAL_TABLET | Freq: Once | ORAL | Status: AC
  Administered 2015-09-13: 1 via ORAL
  Filled 2015-09-13: qty 1

## 2015-09-13 MED ORDER — BENZONATATE 100 MG PO CAPS: 100.0000 mg | ORAL_CAPSULE | Freq: Three times a day (TID) | ORAL | Status: DC

## 2015-09-13 MED ORDER — AMOXICILLIN-POT CLAVULANATE 875-125 MG PO TABS: 1.0000 | ORAL_TABLET | Freq: Two times a day (BID) | ORAL | Status: DC

## 2015-09-13 NOTE — ED Provider Notes (Signed)
CSN: [id number]     Arrival date & time 09/13/15  2023 History   First MD Initiated Contact with Patient 09/13/15 2048     Chief Complaint  Patient presents with  . Medication Reaction     (Consider location/radiation/quality/duration/timing/severity/associated sxs/prior Treatment) HPI Comments: The patient is a 25 year old female, she presents to the hospital stating that every time she takes cold medicine her heart starts to hurt and her heart starts to race and she feels like her blood pressure goes up. She has never had these problems in the past in fact she has no other chronic medical problems other than depression and anxiety. She states that over the last week she has been coughing and has had a sore throat with some phlegm dripping down her throat, she has tried to take over-the-counter medications but that causes her to have the symptoms. She is not having the symptoms at this time. She does not feel short of breath at rest, she has no swelling of her legs, she does not take oral contraceptive pills (states she is supposed to but never takes them).  The history is provided by the patient.    Past Medical History  Diagnosis Date  . ADHD (attention deficit hyperactivity disorder)   . Depression   . Anxiety   . Thrombocytopenia ([place]) 11/25/2011    Consistent with ITP  . Clotting disorder ([place])   . Cough   . Leg swelling   . 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 Obesity 01/05/2013   Past Surgical History  Procedure Laterality Date  . Tonsilectomy, adenoidectomy, bilateral myringotomy and tubes    . Tonsillectomy     Family History  Problem Relation Age of Onset  . Diabetes Maternal Uncle   . Kidney disease Maternal Uncle     renal failure/dialysis  . Heart attack Maternal Grandmother    Social History  Substance Use Topics  . Smoking status: Never Smoker   . Smokeless tobacco: Never Used  . Alcohol Use: No   OB History    Gravida Para Term Preterm AB TAB SAB Ectopic Multiple Living   1 1 1             Review of Systems  All other systems reviewed and are negative.     Allergies  Codeine and Zofran  Home Medications   Prior to Admission medications   Medication Sig Start Date End Date Taking? Authorizing Provider  Phenyleph-CPM-DM-APAP (ALKA-SELTZER PLUS COLD & COUGH) 08-25-08-325 MG CAPS Take 2 capsules by mouth every 8 (eight) hours as needed (Cold Symptoms).   Yes Historical Provider, MD  PRESCRIPTION MEDICATION Take 1 tablet by mouth daily. Birth Control   Yes Historical Provider, MD  amoxicillin-clavulanate (AUGMENTIN) 875-125 MG tablet Take 1 tablet by mouth every 12 (twelve) hours. 09/13/15   Noemi Chapel, MD  benzonatate (TESSALON) 100 MG capsule Take 1 capsule (100 mg total) by mouth every 8 (eight) hours. 09/13/15   Noemi Chapel, MD  phenazopyridine (PYRIDIUM) 200 MG tablet Take 1 tablet (200 mg total) by mouth 3 (three) times daily. 08/22/15   Hope Bunnie Pion, NP  Prenatal Vit-Fe Fumarate-FA (PRENATAL PO) Take 2 capsules by mouth daily.    Historical Provider, MD  promethazine (PHENERGAN) 25 MG tablet Take 1 tablet (25 mg total) by mouth every 6 (six) hours as needed for nausea or vomiting. 08/22/15   Hope Bunnie Pion, NP  traMADol (ULTRAM) 50 MG tablet Take 1 tablet (50  mg total) by mouth every 6 (six) hours as needed. 08/22/15   Hope Bunnie Pion, NP   BP 113/102 mmHg  Pulse 102  Temp(Src) 98.1 F (36.7 C) (Oral)  Resp 20  Ht 5\' 4"  (1.626 m)  Wt 280 lb (127.007 kg)  BMI 48.04 kg/m2  SpO2 100%  LMP 07/28/2015 Physical Exam  Constitutional: She appears well-developed and well-nourished. No distress.  HENT:  Head: Normocephalic and atraumatic.  Mouth/Throat: Oropharynx is clear and moist. No oropharyngeal exudate.  Eyes: Conjunctivae and EOM are normal. Pupils are equal, round, and reactive to light. Right eye exhibits no discharge. Left eye exhibits no discharge. No scleral icterus.  Neck: Normal range  of motion. Neck supple. No JVD present. No thyromegaly present.  Cardiovascular: Normal rate, regular rhythm, normal heart sounds and intact distal pulses.  Exam reveals no gallop and no friction rub.   No murmur heard. Pulmonary/Chest: Effort normal and breath sounds normal. No respiratory distress. She has no wheezes. She has no rales.  Abdominal: Soft. Bowel sounds are normal. She exhibits no distension and no mass. There is no tenderness.  Musculoskeletal: Normal range of motion. She exhibits no edema or tenderness.  Lymphadenopathy:    She has no cervical adenopathy.  Neurological: She is alert. Coordination normal.  Skin: Skin is warm and dry. No rash noted. No erythema.  Psychiatric: She has a normal mood and affect. Her behavior is normal.  Nursing note and vitals reviewed.   ED Course  Procedures (including critical care time) Labs Review Labs Reviewed  BASIC METABOLIC PANEL - Abnormal; Notable for the following:    Glucose, Bld 134 (*)    Calcium 8.7 (*)    All other components within normal limits  CBC    Imaging Review Dg Chest 2 View  09/13/2015  CLINICAL DATA:  25 year old presenting with 3 day history of generalized chest pain, tachycardia and productive cough. EXAM: CHEST  2 VIEW COMPARISON:  05/09/2014 and earlier, including CTA chest 01/14/2012. FINDINGS: Cardiomediastinal silhouette unremarkable, unchanged. Prominent bronchovascular markings diffusely and moderate to marked central peribronchial thickening, similar to the January, 2016 examination, more so than in 2013. Streaky and patchy airspace opacities in the right lower lobe. No confluent airspace consolidation. No pleural effusions. Visualized bony thorax intact. IMPRESSION: Moderate changes of bronchitis and/or asthma with likely superimposed right lower lobe bronchopneumonia. Electronically Signed   By: Evangeline Dakin M.D.   On: 09/13/2015 22:14   I have personally reviewed and evaluated these images and lab  results as part of my medical decision-making.   EKG Interpretation   Date/Time:  Saturday Sep 13 2015 20:35:33 EDT Ventricular Rate:  114 PR Interval:  124 QRS Duration: 84 QT Interval:  320 QTC Calculation: 441 R Axis:   72 Text Interpretation:  Sinus tachycardia Abnormal QRS-T angle, consider  primary T wave abnormality Abnormal ECG Since last tracing rate faster  Confirmed by MILLER  MD, BRIAN ([id number]) on 09/13/2015 8:47:47 PM      MDM   Final diagnoses:  CAP (community acquired pneumonia)    The patient is obese but has normal exam findings other than a mild tachycardia with a pulse of about 100. She otherwise appears to have an upper respiratory infection, we'll obtain a chest x-ray. I have cautioned her against the use of any kind of over-the-counter medications as they may cause a stimulant effect on the heart. She is avoiding caffeine and has come off of soda pop approximately 3 weeks ago. She denies  alcohol use, tobacco use and has no other risk factors for coronary disease. Her EKG is unremarkable.  Tests show that she has small pna - informed Other lats normal Well appearing VS normal  Informed of results Expressed understanding.  Meds given in ED:  Medications  amoxicillin-clavulanate (AUGMENTIN) 875-125 MG per tablet 1 tablet (not administered)    New Prescriptions   AMOXICILLIN-CLAVULANATE (AUGMENTIN) 875-125 MG TABLET    Take 1 tablet by mouth every 12 (twelve) hours.   BENZONATATE (TESSALON) 100 MG CAPSULE    Take 1 capsule (100 mg total) by mouth every 8 (eight) hours.      Noemi Chapel, MD 09/13/15 [phone]

## 2015-09-13 NOTE — ED Notes (Signed)
Patient states that her throat hurts worse since she has been here in the ED. Patient states that it hurts to talk, swallow and cough. States that her ears feel funny. Asking if EDP will do a strep test on her.

## 2015-09-13 NOTE — ED Notes (Signed)
I have been taking over the counter cold medication and every time I take something over the counter for my cold, my chest starts hurting really bad, my blood pressure goes up, and I get really shaky.  30 minutes ago I took Copywriter, advertising and now my chest hurts again.  When I cough the mucus comes up in my throat.  My throat hurts.

## 2015-09-13 NOTE — Discharge Instructions (Signed)

## 2016-05-04 DIAGNOSIS — J029 Acute pharyngitis, unspecified: Secondary | ICD-10-CM | POA: Diagnosis not present

## 2016-05-04 DIAGNOSIS — R05 Cough: Secondary | ICD-10-CM | POA: Diagnosis not present

## 2016-05-04 DIAGNOSIS — J40 Bronchitis, not specified as acute or chronic: Secondary | ICD-10-CM | POA: Diagnosis not present

## 2016-05-04 DIAGNOSIS — J209 Acute bronchitis, unspecified: Secondary | ICD-10-CM | POA: Diagnosis not present

## 2016-05-05 DIAGNOSIS — R05 Cough: Secondary | ICD-10-CM | POA: Diagnosis not present

## 2016-05-05 DIAGNOSIS — F909 Attention-deficit hyperactivity disorder, unspecified type: Secondary | ICD-10-CM | POA: Diagnosis not present

## 2016-05-05 DIAGNOSIS — Z1389 Encounter for screening for other disorder: Secondary | ICD-10-CM | POA: Diagnosis not present

## 2016-05-05 DIAGNOSIS — H6691 Otitis media, unspecified, right ear: Secondary | ICD-10-CM | POA: Diagnosis not present

## 2016-05-05 DIAGNOSIS — Z6825 Body mass index (BMI) 25.0-25.9, adult: Secondary | ICD-10-CM | POA: Diagnosis not present

## 2016-05-18 DIAGNOSIS — F909 Attention-deficit hyperactivity disorder, unspecified type: Secondary | ICD-10-CM | POA: Diagnosis not present

## 2016-05-18 DIAGNOSIS — Z23 Encounter for immunization: Secondary | ICD-10-CM | POA: Diagnosis not present

## 2016-05-18 DIAGNOSIS — Z1389 Encounter for screening for other disorder: Secondary | ICD-10-CM | POA: Diagnosis not present

## 2016-05-18 DIAGNOSIS — Z6841 Body Mass Index (BMI) 40.0 and over, adult: Secondary | ICD-10-CM | POA: Diagnosis not present

## 2016-06-25 DIAGNOSIS — Z1389 Encounter for screening for other disorder: Secondary | ICD-10-CM | POA: Diagnosis not present

## 2016-06-25 DIAGNOSIS — G4700 Insomnia, unspecified: Secondary | ICD-10-CM | POA: Diagnosis not present

## 2016-06-25 DIAGNOSIS — Z6841 Body Mass Index (BMI) 40.0 and over, adult: Secondary | ICD-10-CM | POA: Diagnosis not present

## 2016-07-12 ENCOUNTER — Encounter: Payer: Self-pay | Admitting: Women's Health

## 2016-10-01 DIAGNOSIS — F909 Attention-deficit hyperactivity disorder, unspecified type: Secondary | ICD-10-CM | POA: Diagnosis not present

## 2016-10-01 DIAGNOSIS — Z1389 Encounter for screening for other disorder: Secondary | ICD-10-CM | POA: Diagnosis not present

## 2016-10-01 DIAGNOSIS — Z6841 Body Mass Index (BMI) 40.0 and over, adult: Secondary | ICD-10-CM | POA: Diagnosis not present

## 2016-10-01 DIAGNOSIS — R350 Frequency of micturition: Secondary | ICD-10-CM | POA: Diagnosis not present

## 2016-10-01 DIAGNOSIS — G43909 Migraine, unspecified, not intractable, without status migrainosus: Secondary | ICD-10-CM | POA: Diagnosis not present

## 2016-10-04 DIAGNOSIS — Z5181 Encounter for therapeutic drug level monitoring: Secondary | ICD-10-CM | POA: Diagnosis not present

## 2017-01-21 DIAGNOSIS — J209 Acute bronchitis, unspecified: Secondary | ICD-10-CM | POA: Diagnosis not present

## 2017-01-21 DIAGNOSIS — Z6841 Body Mass Index (BMI) 40.0 and over, adult: Secondary | ICD-10-CM | POA: Diagnosis not present

## 2017-02-18 DIAGNOSIS — F909 Attention-deficit hyperactivity disorder, unspecified type: Secondary | ICD-10-CM | POA: Diagnosis not present

## 2017-02-18 DIAGNOSIS — F329 Major depressive disorder, single episode, unspecified: Secondary | ICD-10-CM | POA: Diagnosis not present

## 2017-02-18 DIAGNOSIS — J310 Chronic rhinitis: Secondary | ICD-10-CM | POA: Diagnosis not present

## 2017-02-18 DIAGNOSIS — Z1389 Encounter for screening for other disorder: Secondary | ICD-10-CM | POA: Diagnosis not present

## 2017-02-18 DIAGNOSIS — Z6841 Body Mass Index (BMI) 40.0 and over, adult: Secondary | ICD-10-CM | POA: Diagnosis not present

## 2017-02-18 DIAGNOSIS — E782 Mixed hyperlipidemia: Secondary | ICD-10-CM | POA: Diagnosis not present

## 2017-02-20 ENCOUNTER — Encounter (HOSPITAL_COMMUNITY): Payer: Self-pay | Admitting: *Deleted

## 2017-02-20 ENCOUNTER — Emergency Department (HOSPITAL_COMMUNITY): Payer: BLUE CROSS/BLUE SHIELD

## 2017-02-20 ENCOUNTER — Emergency Department (HOSPITAL_COMMUNITY)
Admission: EM | Admit: 2017-02-20 | Discharge: 2017-02-20 | Disposition: A | Discharge status: Home / Self Care | Payer: BLUE CROSS/BLUE SHIELD | Attending: Emergency Medicine | Admitting: Emergency Medicine

## 2017-02-20 DIAGNOSIS — Z79899 Other long term (current) drug therapy: Secondary | ICD-10-CM | POA: Insufficient documentation

## 2017-02-20 DIAGNOSIS — F909 Attention-deficit hyperactivity disorder, unspecified type: Secondary | ICD-10-CM | POA: Insufficient documentation

## 2017-02-20 DIAGNOSIS — R109 Unspecified abdominal pain: Secondary | ICD-10-CM | POA: Diagnosis not present

## 2017-02-20 DIAGNOSIS — R1032 Left lower quadrant pain: Secondary | ICD-10-CM | POA: Diagnosis not present

## 2017-02-20 LAB — PREGNANCY, URINE: Preg Test, Ur: NEGATIVE

## 2017-02-20 LAB — URINALYSIS, ROUTINE W REFLEX MICROSCOPIC
Bacteria, UA: NONE SEEN
Bilirubin Urine: NEGATIVE
Glucose, UA: NEGATIVE mg/dL
Ketones, ur: NEGATIVE mg/dL
Leukocytes, UA: NEGATIVE
Nitrite: NEGATIVE
Protein, ur: NEGATIVE mg/dL
Specific Gravity, Urine: 1.015 (ref 1.005–1.030)
pH: 5 (ref 5.0–8.0)

## 2017-02-20 MED ORDER — KETOROLAC TROMETHAMINE 30 MG/ML IJ SOLN
30.0000 mg | Freq: Once | INTRAMUSCULAR | Status: AC
  Administered 2017-02-20: 30 mg via INTRAVENOUS
  Filled 2017-02-20: qty 1

## 2017-02-20 MED ORDER — CYCLOBENZAPRINE HCL 10 MG PO TABS: 10.0000 mg | ORAL_TABLET | Freq: Three times a day (TID) | ORAL | 0 refills | Status: DC | PRN

## 2017-02-20 MED ORDER — MORPHINE SULFATE (PF) 4 MG/ML IV SOLN
4.0000 mg | Freq: Once | INTRAVENOUS | Status: AC
  Administered 2017-02-20: 4 mg via INTRAVENOUS
  Filled 2017-02-20: qty 1

## 2017-02-20 NOTE — ED Provider Notes (Signed)
[facility] EMERGENCY DEPARTMENT Provider Note   CSN: [id number] Arrival date & time: 02/20/17  4656     History   Chief Complaint Chief Complaint  Patient presents with  . Flank Pain    HPI Janice Taylor is a 26 y.o. female.  Patient is a 26 year old female with past medical history of obesity, ADHD, ITP, and prior renal calculi.  She presents today for evaluation of left flank pain.  This started earlier this afternoon while at work and began in the absence of any injury or trauma.  This radiates to her front of her lower abdomen.  She took some ibuprofen which did seem to help somewhat, however her pain has returned.  She denies any dysuria, hematuria, or fevers.   The history is provided by the patient.  Flank Pain  This is a new problem. The current episode started 6 to 12 hours ago. The problem occurs constantly. The problem has not changed since onset.Associated symptoms include abdominal pain. Pertinent negatives include no chest pain. Nothing aggravates the symptoms. Relieved by: Ibuprofen. Treatments tried: Ibuprofen. The treatment provided mild relief.    Past Medical History:  Diagnosis Date  . ADHD (attention deficit hyperactivity disorder)   . Anxiety   . Clotting disorder ([place])   . Cough   . Depression   . 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 Leg swelling   . Obesity 01/05/2013  . Thrombocytopenia ([place]) 11/25/2011   Consistent with ITP    Patient Active Problem List   Diagnosis Date Noted  . Obesity 01/05/2013  . 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 TONSILECTOMY, ADENOIDECTOMY, BILATERAL MYRINGOTOMY AND TUBES    . TONSILLECTOMY      OB History    Gravida Para Term Preterm AB Living   1 1 1          SAB TAB Ectopic Multiple Live Births                   Home Medications    Prior to Admission medications   Medication Sig Start Date  End Date Taking? Authorizing Provider  amphetamine-dextroamphetamine (ADDERALL XR) 30 MG 24 hr capsule Take 30 mg by mouth daily.   Yes [provider]  zolpidem (AMBIEN) 10 MG tablet Take 10 mg by mouth at bedtime as needed for sleep.   Yes [provider]  amoxicillin-clavulanate (AUGMENTIN) 875-125 MG tablet Take 1 tablet by mouth every 12 (twelve) hours. 09/13/15   Noemi Chapel, MD  benzonatate (TESSALON) 100 MG capsule Take 1 capsule (100 mg total) by mouth every 8 (eight) hours. 09/13/15   Noemi Chapel, MD  phenazopyridine (PYRIDIUM) 200 MG tablet Take 1 tablet (200 mg total) by mouth 3 (three) times daily. 08/22/15   Ashley Murrain, NP  Phenyleph-CPM-DM-APAP (ALKA-SELTZER PLUS COLD & COUGH) 08-25-08-325 MG CAPS Take 2 capsules by mouth every 8 (eight) hours as needed (Cold Symptoms).    [provider]  Prenatal Vit-Fe Fumarate-FA (PRENATAL PO) Take 2 capsules by mouth daily.    [provider]  PRESCRIPTION MEDICATION Take 1 tablet by mouth daily. Birth Writer, Historical, MD  promethazine (PHENERGAN) 25 MG tablet Take 1 tablet (25 mg total) by mouth every 6 (six) hours as needed for nausea or vomiting. 08/22/15   Ashley Murrain, NP  traMADol (ULTRAM) 50 MG tablet Take 1 tablet (50 mg total) by  mouth every 6 (six) hours as needed. 08/22/15   Ashley Murrain, NP    Family History Family History  Problem Relation Age of Onset  . Diabetes Maternal Uncle   . Kidney disease Maternal Uncle        renal failure/dialysis  . Heart attack Maternal Grandmother     Social History Social History  Substance Use Topics  . Smoking status: Never Smoker  . Smokeless tobacco: Never Used  . Alcohol use No     Allergies   Codeine and Zofran [ondansetron hcl-dextrose]   Review of Systems Review of Systems  Cardiovascular: Negative for chest pain.  Gastrointestinal: Positive for abdominal pain.  Genitourinary: Positive for flank pain.  All other  systems reviewed and are negative.    Physical Exam Updated Vital Signs BP (!) 158/81 (BP Location: Right Arm)   Pulse 95   Temp 98.4 F (36.9 C) (Oral)   Resp 18   Ht 5\' 4"  (1.626 m)   Wt 131.5 kg (290 lb)   LMP 01/21/2017   SpO2 98%   BMI 49.78 kg/m   Physical Exam  Constitutional: She is oriented to person, place, and time. She appears well-developed and well-nourished. No distress.  HENT:  Head: Normocephalic and atraumatic.  Neck: Normal range of motion. Neck supple.  Cardiovascular: Normal rate and regular rhythm.  Exam reveals no gallop and no friction rub.   No murmur heard. Pulmonary/Chest: Effort normal and breath sounds normal. No respiratory distress. She has no wheezes.  Abdominal: Soft. Bowel sounds are normal. She exhibits no distension. There is no tenderness.  There is mild CVA tenderness on the left.  Musculoskeletal: Normal range of motion.  Neurological: She is alert and oriented to person, place, and time.  Skin: Skin is warm and dry. She is not diaphoretic.  Nursing note and vitals reviewed.    ED Treatments / Results  Labs (all labs ordered are listed, but only abnormal results are displayed) Labs Reviewed  URINALYSIS, ROUTINE W REFLEX MICROSCOPIC  PREGNANCY, URINE    EKG  EKG Interpretation None       Radiology No results found.  Procedures Procedures (including critical care time)  Medications Ordered in ED Medications  ketorolac (TORADOL) 30 MG/ML injection 30 mg (not administered)  morphine 4 MG/ML injection 4 mg (not administered)     Initial Impression / Assessment and Plan / ED Course  I have reviewed the triage vital signs and the nursing notes.  Pertinent labs & imaging results that were available during my care of the patient were reviewed by me and considered in my medical decision making (see chart for details).  Urinalysis does show microscopic hematuria, however there is no evidence for obstructing renal  calculus.  I suspect a musculoskeletal etiology.  She will be treated with NSAIDs, muscle relaxers, and follow-up as needed.  Final Clinical Impressions(s) / ED Diagnoses   Final diagnoses:  None    New Prescriptions New Prescriptions   No medications on file     Veryl Speak, MD 02/20/17 [phone]

## 2017-02-20 NOTE — ED Triage Notes (Signed)
Pt states left flank pain that started yesterday while at work.

## 2017-02-20 NOTE — Discharge Instructions (Signed)
Ibuprofen 600 mg every 6 hours as needed for pain.  Flexeril as prescribed as needed for pain not relieved with ibuprofen.  Follow-up with your primary doctor if symptoms are not improving in the next week, and return to the ER if symptoms significantly worsen or change in the meantime.

## 2017-03-03 DIAGNOSIS — R05 Cough: Secondary | ICD-10-CM | POA: Diagnosis not present

## 2017-03-03 DIAGNOSIS — J209 Acute bronchitis, unspecified: Secondary | ICD-10-CM | POA: Diagnosis not present

## 2017-03-22 DIAGNOSIS — F4320 Adjustment disorder, unspecified: Secondary | ICD-10-CM | POA: Diagnosis not present

## 2017-03-22 DIAGNOSIS — Z1389 Encounter for screening for other disorder: Secondary | ICD-10-CM | POA: Diagnosis not present

## 2017-03-22 DIAGNOSIS — Z6841 Body Mass Index (BMI) 40.0 and over, adult: Secondary | ICD-10-CM | POA: Diagnosis not present

## 2017-03-25 DIAGNOSIS — B373 Candidiasis of vulva and vagina: Secondary | ICD-10-CM | POA: Diagnosis not present

## 2017-03-25 DIAGNOSIS — N898 Other specified noninflammatory disorders of vagina: Secondary | ICD-10-CM | POA: Diagnosis not present

## 2017-03-25 DIAGNOSIS — N939 Abnormal uterine and vaginal bleeding, unspecified: Secondary | ICD-10-CM | POA: Diagnosis not present

## 2017-03-25 DIAGNOSIS — N390 Urinary tract infection, site not specified: Secondary | ICD-10-CM | POA: Diagnosis not present

## 2017-03-25 DIAGNOSIS — R300 Dysuria: Secondary | ICD-10-CM | POA: Diagnosis not present

## 2017-05-01 DIAGNOSIS — R21 Rash and other nonspecific skin eruption: Secondary | ICD-10-CM | POA: Diagnosis not present

## 2017-05-01 DIAGNOSIS — N390 Urinary tract infection, site not specified: Secondary | ICD-10-CM | POA: Diagnosis not present

## 2017-05-01 DIAGNOSIS — J069 Acute upper respiratory infection, unspecified: Secondary | ICD-10-CM | POA: Diagnosis not present

## 2017-05-01 DIAGNOSIS — R05 Cough: Secondary | ICD-10-CM | POA: Diagnosis not present

## 2017-05-02 DIAGNOSIS — R05 Cough: Secondary | ICD-10-CM | POA: Diagnosis not present

## 2017-05-02 DIAGNOSIS — J029 Acute pharyngitis, unspecified: Secondary | ICD-10-CM | POA: Diagnosis not present

## 2017-05-02 DIAGNOSIS — Z1389 Encounter for screening for other disorder: Secondary | ICD-10-CM | POA: Diagnosis not present

## 2017-05-02 DIAGNOSIS — Z6841 Body Mass Index (BMI) 40.0 and over, adult: Secondary | ICD-10-CM | POA: Diagnosis not present

## 2017-05-02 DIAGNOSIS — N390 Urinary tract infection, site not specified: Secondary | ICD-10-CM | POA: Diagnosis not present

## 2017-05-02 DIAGNOSIS — R300 Dysuria: Secondary | ICD-10-CM | POA: Diagnosis not present

## 2017-05-13 DIAGNOSIS — Z6841 Body Mass Index (BMI) 40.0 and over, adult: Secondary | ICD-10-CM | POA: Diagnosis not present

## 2017-05-13 DIAGNOSIS — F909 Attention-deficit hyperactivity disorder, unspecified type: Secondary | ICD-10-CM | POA: Diagnosis not present

## 2017-05-13 DIAGNOSIS — J0100 Acute maxillary sinusitis, unspecified: Secondary | ICD-10-CM | POA: Diagnosis not present

## 2017-05-20 DIAGNOSIS — L0201 Cutaneous abscess of face: Secondary | ICD-10-CM | POA: Diagnosis not present

## 2017-05-22 ENCOUNTER — Emergency Department (HOSPITAL_COMMUNITY)
Admission: EM | Admit: 2017-05-22 | Discharge: 2017-05-22 | Disposition: A | Discharge status: Home / Self Care | Payer: BLUE CROSS/BLUE SHIELD | Attending: Emergency Medicine | Admitting: Emergency Medicine

## 2017-05-22 ENCOUNTER — Encounter (HOSPITAL_COMMUNITY): Payer: Self-pay

## 2017-05-22 ENCOUNTER — Emergency Department (HOSPITAL_COMMUNITY): Payer: BLUE CROSS/BLUE SHIELD

## 2017-05-22 ENCOUNTER — Other Ambulatory Visit: Payer: Self-pay

## 2017-05-22 DIAGNOSIS — R600 Localized edema: Secondary | ICD-10-CM | POA: Diagnosis not present

## 2017-05-22 DIAGNOSIS — Z79899 Other long term (current) drug therapy: Secondary | ICD-10-CM | POA: Diagnosis not present

## 2017-05-22 DIAGNOSIS — R221 Localized swelling, mass and lump, neck: Secondary | ICD-10-CM | POA: Diagnosis not present

## 2017-05-22 DIAGNOSIS — L03211 Cellulitis of face: Secondary | ICD-10-CM | POA: Diagnosis not present

## 2017-05-22 DIAGNOSIS — M542 Cervicalgia: Secondary | ICD-10-CM | POA: Diagnosis not present

## 2017-05-22 LAB — CBC WITH DIFFERENTIAL/PLATELET
Basophils Absolute: 0 10*3/uL (ref 0.0–0.1)
Basophils Relative: 0 %
Eosinophils Absolute: 0.1 10*3/uL (ref 0.0–0.7)
Eosinophils Relative: 1 %
HCT: 41.3 % (ref 36.0–46.0)
Hemoglobin: 13.4 g/dL (ref 12.0–15.0)
Lymphocytes Relative: 22 %
Lymphs Abs: 2.4 10*3/uL (ref 0.7–4.0)
MCH: 29.6 pg (ref 26.0–34.0)
MCHC: 32.4 g/dL (ref 30.0–36.0)
MCV: 91.2 fL (ref 78.0–100.0)
Monocytes Absolute: 0.7 10*3/uL (ref 0.1–1.0)
Monocytes Relative: 6 %
Neutro Abs: 8.1 10*3/uL — ABNORMAL HIGH (ref 1.7–7.7)
Neutrophils Relative %: 71 %
Platelets: 202 10*3/uL (ref 150–400)
RBC: 4.53 MIL/uL (ref 3.87–5.11)
RDW: 12.8 % (ref 11.5–15.5)
WBC: 11.3 10*3/uL — ABNORMAL HIGH (ref 4.0–10.5)

## 2017-05-22 LAB — BASIC METABOLIC PANEL
Anion gap: 11 (ref 5–15)
BUN: 12 mg/dL (ref 6–20)
CO2: 23 mmol/L (ref 22–32)
Calcium: 9.1 mg/dL (ref 8.9–10.3)
Chloride: 104 mmol/L (ref 101–111)
Creatinine, Ser: 0.93 mg/dL (ref 0.44–1.00)
GFR calc Af Amer: >60 mL/min (ref >60)
GFR calc non Af Amer: >60 mL/min (ref >60)
Glucose, Bld: 88 mg/dL (ref 65–99)
Potassium: 4.1 mmol/L (ref 3.5–5.1)
Sodium: 138 mmol/L (ref 135–145)

## 2017-05-22 MED ORDER — HYDROCODONE-ACETAMINOPHEN 5-325 MG PO TABS: ORAL_TABLET | ORAL | 0 refills | Status: DC

## 2017-05-22 MED ORDER — IOPAMIDOL (ISOVUE-300) INJECTION 61%
75.0000 mL | Freq: Once | INTRAVENOUS | Status: AC | PRN
  Administered 2017-05-22: 75 mL via INTRAVENOUS

## 2017-05-22 MED ORDER — CLINDAMYCIN HCL 300 MG PO CAPS: 300.0000 mg | ORAL_CAPSULE | Freq: Three times a day (TID) | ORAL | 0 refills | Status: DC

## 2017-05-22 MED ORDER — CLINDAMYCIN HCL 150 MG PO CAPS
300.0000 mg | ORAL_CAPSULE | Freq: Once | ORAL | Status: AC
  Administered 2017-05-22: 300 mg via ORAL
  Filled 2017-05-22: qty 2

## 2017-05-22 MED ORDER — KETOROLAC TROMETHAMINE 30 MG/ML IJ SOLN
30.0000 mg | Freq: Once | INTRAMUSCULAR | Status: AC
  Administered 2017-05-22: 30 mg via INTRAVENOUS
  Filled 2017-05-22: qty 1

## 2017-05-22 MED ORDER — IBUPROFEN 800 MG PO TABS: 800.0000 mg | ORAL_TABLET | Freq: Three times a day (TID) | ORAL | 0 refills | Status: DC

## 2017-05-22 MED ORDER — MORPHINE SULFATE (PF) 4 MG/ML IV SOLN
4.0000 mg | Freq: Once | INTRAVENOUS | Status: AC
  Administered 2017-05-22: 4 mg via INTRAVENOUS
  Filled 2017-05-22: qty 1

## 2017-05-22 NOTE — ED Provider Notes (Signed)
[facility] EMERGENCY DEPARTMENT Provider Note   CSN: [id number] Arrival date & time: 05/22/17  1029     History   Chief Complaint Chief Complaint  Patient presents with  . Abscess    HPI Janice Taylor is a 27 y.o. female.  HPI   Janice Taylor is a 27 y.o. female who presents to the Emergency Department complaining of pain and swelling to her left lower chin for 2-3 days.  She states that she has been squeezing on a pimple on her chin.  She noticed increased pain and swelling the following day.  She was seen at a clinic and prescribed Bactrim which she has been taking for 2 days which she states is not helping.  She denies known drainage.  She states the swelling has increased since yesterday and is now involved her lower left lip and pain is extending down into her neck.  She denies pain with swallowing or fever.  She reports a history of frequent abscesses, but denies history of MRSA.   Past Medical History:  Diagnosis Date  . ADHD (attention deficit hyperactivity disorder)   . Anxiety   . Clotting disorder ([place])   . Cough   . Depression   . 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 Leg swelling   . Obesity 01/05/2013  . Thrombocytopenia ([place]) 11/25/2011   Consistent with ITP    Patient Active Problem List   Diagnosis Date Noted  . Obesity 01/05/2013  . 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 TONSILECTOMY, ADENOIDECTOMY, BILATERAL MYRINGOTOMY AND TUBES    . TONSILLECTOMY      OB History    Gravida Para Term Preterm AB Living   1 1 1          SAB TAB Ectopic Multiple Live Births                   Home Medications    Prior to Admission medications   Medication Sig Start Date End Date Taking? Authorizing Provider  acyclovir (ZOVIRAX) 400 MG tablet Take 1 tablet by mouth daily as needed. 05/04/17  Yes [provider]  amphetamine-dextroamphetamine  (ADDERALL XR) 30 MG 24 hr capsule Take 30 mg by mouth daily.   Yes [provider]  ibuprofen (ADVIL,MOTRIN) 200 MG tablet Take 800 mg by mouth every 6 (six) hours as needed for moderate pain.   Yes [provider]  Pediatric Multiple Vit-C-FA (FLINSTONES GUMMIES OMEGA-3 DHA) CHEW Chew 1 tablet by mouth daily.   Yes [provider]  Phenyleph-CPM-DM-APAP (ALKA-SELTZER PLUS COLD & COUGH) 08-25-08-325 MG CAPS Take 2 capsules by mouth every 8 (eight) hours as needed (Cold Symptoms).   Yes [provider]  sulfamethoxazole-trimethoprim (BACTRIM,SEPTRA) 400-80 MG tablet Take 1 tablet by mouth 2 (two) times daily.   Yes [provider]  zolpidem (AMBIEN) 10 MG tablet Take 10 mg by mouth at bedtime as needed for sleep.   Yes [provider]    Family History Family History  Problem Relation Age of Onset  . Diabetes Maternal Uncle   . Kidney disease Maternal Uncle        renal failure/dialysis  . Heart attack Maternal Grandmother     Social History Social History   Tobacco Use  . Smoking status: Never Smoker  . Smokeless tobacco: Never Used  Substance Use Topics  . Alcohol use: No  . Drug use: No  Allergies   Codeine and Zofran [ondansetron hcl-dextrose]   Review of Systems Review of Systems  Constitutional: Negative for chills and fever.  Gastrointestinal: Negative for nausea and vomiting.  Musculoskeletal: Positive for neck pain. Negative for arthralgias and joint swelling.  Skin: Positive for color change.       Redness pain and swelling of the left chin  Neurological: Negative for dizziness, weakness, numbness and headaches.  Hematological: Negative for adenopathy.  All other systems reviewed and are negative.    Physical Exam Updated Vital Signs BP (!) 158/70 (BP Location: Right Arm)   Pulse 81   Temp 99.1 F (37.3 C) (Oral)   Resp 18   Ht 5\' 4"  (1.626 m)   Wt 127 kg (280 lb)   LMP 04/21/2017   SpO2 100%    BMI 48.06 kg/m   Physical Exam  Constitutional: She is oriented to person, place, and time. She appears well-developed and well-nourished. No distress.  HENT:  Head: Normocephalic and atraumatic.  Mouth/Throat: Oropharynx is clear and moist.  Neck: Normal range of motion.  ttp of the left submental and submandibular area.    Cardiovascular: Normal rate, regular rhythm and normal heart sounds.  No murmur heard. Pulmonary/Chest: Effort normal and breath sounds normal. No respiratory distress.  Lymphadenopathy:       Head (left side): No submental and no submandibular adenopathy present.    She has no cervical adenopathy.  Neurological: She is alert and oriented to person, place, and time. No sensory deficit. She exhibits normal muscle tone. Coordination normal.  Skin: Skin is warm and dry. There is erythema.  4 cm focal area of erythema and induration to the left lower chin.  No fluctuance or drainage  Psychiatric: She has a normal mood and affect.  Nursing note and vitals reviewed.    ED Treatments / Results  Labs (all labs ordered are listed, but only abnormal results are displayed) Labs Reviewed  CBC WITH DIFFERENTIAL/PLATELET - Abnormal; Notable for the following components:      Result Value   WBC 11.3 (*)    Neutro Abs 8.1 (*)    All other components within normal limits  BASIC METABOLIC PANEL    EKG  EKG Interpretation None       Radiology Ct Soft Tissue Neck W Contrast  Result Date: 05/22/2017 CLINICAL DATA:  Redness and soreness of the chin. Assess for abscess. EXAM: CT NECK WITH CONTRAST TECHNIQUE: Multidetector CT imaging of the neck was performed using the standard protocol following the bolus administration of intravenous contrast. CONTRAST:  56mL ISOVUE-300 IOPAMIDOL (ISOVUE-300) INJECTION 61% COMPARISON:  None. FINDINGS: Pharynx and larynx: No mucosal or submucosal lesion. Tongue piercing is noted. Salivary glands: Parotid and submandibular glands are  normal. Thyroid: Normal Lymph nodes: No enlarged or low-density nodes on either side of the neck. Vascular: No abnormal vascular finding. Limited intracranial: Normal Visualized orbits: Not included Mastoids and visualized paranasal sinuses: Clear Skeleton: No significant bone finding. Upper chest: Normal Other: There is soft tissue swelling anteriorly into the left of the mandibular symphysis consistent with cellulitis. No evidence of a discrete low-density drainable collection. No evidence of dental or periodontal disease. IMPRESSION: Nonspecific edema in the soft tissues anterior and to the left of the mandibular symphysis consistent with cellulitis. No evidence of discrete or low-density collection to suggest drainable abscess at this time. No evidence of near by dental or periodontal disease. Patient does have tongue piercing. Electronically Signed   By: Jan Fireman.D.  On: 05/22/2017 12:56    Procedures Procedures (including critical care time)  Medications Ordered in ED Medications  ketorolac (TORADOL) 30 MG/ML injection 30 mg (not administered)  clindamycin (CLEOCIN) capsule 300 mg (not administered)  morphine 4 MG/ML injection 4 mg (4 mg Intravenous Given 05/22/17 1142)  iopamidol (ISOVUE-300) 61 % injection 75 mL (75 mLs Intravenous Contrast Given 05/22/17 1201)     Initial Impression / Assessment and Plan / ED Course  I have reviewed the triage vital signs and the nursing notes.  Pertinent labs & imaging results that were available during my care of the patient were reviewed by me and considered in my medical decision making (see chart for details).     Patient well-appearing, nontoxic.  Airways patent.  Localized area of cellulitis to the left lower chin without abscess.  Patient failing Bactrim, will change to clindamycin.  Swelling is felt to be secondary to trauma from squeezing.  Patient advised to continue warm wet compresses and close follow-up with PCP.  Return precautions  discussed.  Final Clinical Impressions(s) / ED Diagnoses   Final diagnoses:  Cellulitis of chin    ED Discharge Orders    None       Kem Parkinson, PA-C 05/22/17 [place], [place], DO 05/25/17 1558

## 2017-05-22 NOTE — ED Triage Notes (Signed)
Patient reports of abscess to right side of chin since Friday. Is currently taking Bactrim with no relief. Swelling noted inside lip.

## 2017-05-22 NOTE — Discharge Instructions (Signed)
Stop the bactrim.  Follow-up with your primary doctor for recheck.  Return here for any worsening symptoms

## 2017-05-25 DIAGNOSIS — Z1389 Encounter for screening for other disorder: Secondary | ICD-10-CM | POA: Diagnosis not present

## 2017-05-25 DIAGNOSIS — L03818 Cellulitis of other sites: Secondary | ICD-10-CM | POA: Diagnosis not present

## 2017-05-25 DIAGNOSIS — Z6841 Body Mass Index (BMI) 40.0 and over, adult: Secondary | ICD-10-CM | POA: Diagnosis not present

## 2017-06-06 DIAGNOSIS — N949 Unspecified condition associated with female genital organs and menstrual cycle: Secondary | ICD-10-CM | POA: Diagnosis not present

## 2017-08-05 DIAGNOSIS — Z1389 Encounter for screening for other disorder: Secondary | ICD-10-CM | POA: Diagnosis not present

## 2017-08-05 DIAGNOSIS — Z6841 Body Mass Index (BMI) 40.0 and over, adult: Secondary | ICD-10-CM | POA: Diagnosis not present

## 2017-08-05 DIAGNOSIS — M549 Dorsalgia, unspecified: Secondary | ICD-10-CM | POA: Diagnosis not present

## 2017-08-06 DIAGNOSIS — R030 Elevated blood-pressure reading, without diagnosis of hypertension: Secondary | ICD-10-CM | POA: Diagnosis not present

## 2017-08-06 DIAGNOSIS — J029 Acute pharyngitis, unspecified: Secondary | ICD-10-CM | POA: Diagnosis not present

## 2017-08-06 DIAGNOSIS — Z6841 Body Mass Index (BMI) 40.0 and over, adult: Secondary | ICD-10-CM | POA: Diagnosis not present

## 2017-08-06 DIAGNOSIS — J020 Streptococcal pharyngitis: Secondary | ICD-10-CM | POA: Diagnosis not present

## 2017-10-13 ENCOUNTER — Other Ambulatory Visit: Payer: Self-pay

## 2017-10-13 ENCOUNTER — Emergency Department (HOSPITAL_COMMUNITY)
Admission: EM | Admit: 2017-10-13 | Discharge: 2017-10-13 | Disposition: A | Discharge status: Home / Self Care | Payer: BLUE CROSS/BLUE SHIELD | Attending: Emergency Medicine | Admitting: Emergency Medicine

## 2017-10-13 ENCOUNTER — Encounter (HOSPITAL_COMMUNITY): Payer: Self-pay

## 2017-10-13 DIAGNOSIS — Y9301 Activity, walking, marching and hiking: Secondary | ICD-10-CM | POA: Diagnosis not present

## 2017-10-13 DIAGNOSIS — Y998 Other external cause status: Secondary | ICD-10-CM | POA: Insufficient documentation

## 2017-10-13 DIAGNOSIS — S79922A Unspecified injury of left thigh, initial encounter: Secondary | ICD-10-CM | POA: Diagnosis not present

## 2017-10-13 DIAGNOSIS — Y9239 Other specified sports and athletic area as the place of occurrence of the external cause: Secondary | ICD-10-CM | POA: Diagnosis not present

## 2017-10-13 DIAGNOSIS — S76212A Strain of adductor muscle, fascia and tendon of left thigh, initial encounter: Secondary | ICD-10-CM | POA: Diagnosis not present

## 2017-10-13 DIAGNOSIS — X503XXA Overexertion from repetitive movements, initial encounter: Secondary | ICD-10-CM | POA: Insufficient documentation

## 2017-10-13 DIAGNOSIS — Z79899 Other long term (current) drug therapy: Secondary | ICD-10-CM | POA: Diagnosis not present

## 2017-10-13 DIAGNOSIS — S76812A Strain of other specified muscles, fascia and tendons at thigh level, left thigh, initial encounter: Secondary | ICD-10-CM | POA: Diagnosis not present

## 2017-10-13 LAB — URINALYSIS, ROUTINE W REFLEX MICROSCOPIC
Bilirubin Urine: NEGATIVE
Glucose, UA: NEGATIVE mg/dL
Hgb urine dipstick: NEGATIVE
Ketones, ur: NEGATIVE mg/dL
Leukocytes, UA: NEGATIVE
Nitrite: NEGATIVE
Protein, ur: NEGATIVE mg/dL
Specific Gravity, Urine: 1.016 (ref 1.005–1.030)
pH: 6 (ref 5.0–8.0)

## 2017-10-13 LAB — PREGNANCY, URINE: Preg Test, Ur: NEGATIVE

## 2017-10-13 MED ORDER — IBUPROFEN 400 MG PO TABS
400.0000 mg | ORAL_TABLET | Freq: Once | ORAL | Status: AC
  Administered 2017-10-13: 400 mg via ORAL

## 2017-10-13 MED ORDER — IBUPROFEN 400 MG PO TABS
ORAL_TABLET | ORAL | Status: AC
  Filled 2017-10-13: qty 1

## 2017-10-13 MED ORDER — CYCLOBENZAPRINE HCL 10 MG PO TABS: 10.0000 mg | ORAL_TABLET | Freq: Every day | ORAL | 0 refills | Status: DC

## 2017-10-13 NOTE — ED Triage Notes (Addendum)
Pt reports pain to left groin since yesterday.   Pt denies strain or injury, pain is worse with movement and stretching the left leg outward.

## 2017-10-13 NOTE — ED Provider Notes (Signed)
[facility] EMERGENCY DEPARTMENT Provider Note   CSN: [id number] Arrival date & time: 10/13/17  1914     History   Chief Complaint Chief Complaint  Patient presents with  . Groin Pain   Pt seen with NP student, I performed history/physical/documentation  HPI Janice Taylor is a 27 y.o. female.  The history is provided by the patient.  Groin Pain  This is a new problem. The current episode started 2 days ago. The problem occurs daily. The problem has been gradually worsening. Pertinent negatives include no abdominal pain. Exacerbated by: bending left hip. The symptoms are relieved by rest. Treatments tried: NSAIDs. The treatment provided no relief.   Patient with history of obesity presents with left groin pain Reports recent increase in her activity, and has been using a stairmaster at the gym Soon after doing this, she began having pain in her left groin.  No falls or trauma.  She is able to ambulate.  It is improved with rest, but does get worse when she starts flexing her hip.  No new back pain.  No leg weakness.  No  swelling in her legs Past Medical History:  Diagnosis Date  . ADHD (attention deficit hyperactivity disorder)   . Anxiety   . Clotting disorder ([place])   . Cough   . Depression   . 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 Leg swelling   . Obesity 01/05/2013  . Thrombocytopenia ([place]) 11/25/2011   Consistent with ITP    Patient Active Problem List   Diagnosis Date Noted  . Obesity 01/05/2013  . 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 TONSILECTOMY, ADENOIDECTOMY, BILATERAL MYRINGOTOMY AND TUBES    . TONSILLECTOMY       OB History    Gravida  1   Para  1   Term  1   Preterm      AB      Living        SAB      TAB      Ectopic      Multiple      Live Births               Home Medications    Prior to Admission medications     Medication Sig Start Date End Date Taking? Authorizing Provider  celecoxib (CELEBREX) 50 MG capsule Take 50 mg by mouth 2 (two) times daily.   Yes [provider]  ibuprofen (ADVIL,MOTRIN) 800 MG tablet Take 1 tablet (800 mg total) by mouth 3 (three) times daily. 05/22/17  Yes Triplett, Tammy, PA-C  phentermine 37.5 MG capsule Take 37.5 mg by mouth every morning.   Yes [provider]  zolpidem (AMBIEN) 10 MG tablet Take 10 mg by mouth at bedtime as needed for sleep.   Yes [provider]  acyclovir (ZOVIRAX) 400 MG tablet Take 1 tablet by mouth daily as needed. 05/04/17   [provider]  amphetamine-dextroamphetamine (ADDERALL XR) 30 MG 24 hr capsule Take 30 mg by mouth daily.    [provider]  cyclobenzaprine (FLEXERIL) 10 MG tablet Take 1 tablet (10 mg total) by mouth at bedtime. 10/13/17   Ripley Fraise, MD  Pediatric Multiple Vit-C-FA (FLINSTONES GUMMIES OMEGA-3 DHA) CHEW Chew 1 tablet by mouth daily.    [provider]    Family History Family History  Problem Relation Age of Onset  . Diabetes  Maternal Uncle   . Kidney disease Maternal Uncle        renal failure/dialysis  . Heart attack Maternal Grandmother     Social History Social History   Tobacco Use  . Smoking status: Never Smoker  . Smokeless tobacco: Never Used  Substance Use Topics  . Alcohol use: No  . Drug use: No     Allergies   Codeine and Zofran [ondansetron hcl-dextrose]   Review of Systems Review of Systems  Constitutional: Negative for fever.  Gastrointestinal: Negative for abdominal pain.  Musculoskeletal: Positive for arthralgias and myalgias. Negative for back pain.  All other systems reviewed and are negative.    Physical Exam Updated Vital Signs BP 139/80   Pulse 94   Temp 98.4 F (36.9 C) (Oral)   Resp 20   Ht 1.626 m (5\' 4" )   Wt 131.5 kg (290 lb)   LMP 09/12/2017 (Approximate)   SpO2 99%   BMI 49.78 kg/m   Physical  Exam CONSTITUTIONAL: Well developed/well nourished HEAD: Normocephalic/atraumatic EYES: EOMI ENMT: Mucous membranes moist NECK: supple no meningeal signs SPINE/BACK:entire spine nontender CV: S1/S2 noted, no murmurs/rubs/gallops noted LUNGS: Lungs are clear to auscultation bilaterally, no apparent distress ABDOMEN: soft, obese NEURO: Pt is awake/alert/appropriate, moves all extremitiesx4.  No facial droop.   EXTREMITIES: pulses normal/equal, full ROM Tenderness to left medial proximal thigh.  Pain is reproduced with hyperflexion of left hip, and external and internal rotation.  No deformities noted.  Distal pulses intact.  No erythema.  No calf tenderness Pelvis stable.  No inguinal lymphadenopathy.  Female chaperone present for exam SKIN: warm, color normal PSYCH: no abnormalities of mood noted, alert and oriented to situation   ED Treatments / Results  Labs (all labs ordered are listed, but only abnormal results are displayed) Labs Reviewed  URINALYSIS, ROUTINE W REFLEX MICROSCOPIC  PREGNANCY, URINE    EKG None  Radiology No results found.  Procedures Procedures   Medications Ordered in ED Medications  ibuprofen (ADVIL,MOTRIN) tablet 400 mg (400 mg Oral Given 10/13/17 0631)     Initial Impression / Assessment and Plan / ED Course  I have reviewed the triage vital signs and the nursing notes.      Patient here with left groin/leg pain after increasing her activity at the gym.  This appears to be a soft tissue injury.  She can ambulate.  No signs of any bony injury and denies falls. She thinks NSAIDs are not working, and request something stronger.  After further discussion, we agreed to try a short course of a muscle relaxant, but I encouraged her to continue NSAIDs, and she will take a day off work for rest  Final Clinical Impressions(s) / ED Diagnoses   Final diagnoses:  Groin strain, left, initial encounter    ED Discharge Orders        Ordered     cyclobenzaprine (FLEXERIL) 10 MG tablet  Daily at bedtime     10/13/17 3664       Ripley Fraise, MD 10/13/17 0745

## 2017-11-25 DIAGNOSIS — M7582 Other shoulder lesions, left shoulder: Secondary | ICD-10-CM | POA: Diagnosis not present

## 2017-11-25 DIAGNOSIS — R300 Dysuria: Secondary | ICD-10-CM | POA: Diagnosis not present

## 2017-11-25 DIAGNOSIS — Z6841 Body Mass Index (BMI) 40.0 and over, adult: Secondary | ICD-10-CM | POA: Diagnosis not present

## 2017-12-02 DIAGNOSIS — G4700 Insomnia, unspecified: Secondary | ICD-10-CM | POA: Diagnosis not present

## 2017-12-02 DIAGNOSIS — Z6841 Body Mass Index (BMI) 40.0 and over, adult: Secondary | ICD-10-CM | POA: Diagnosis not present

## 2018-01-02 DIAGNOSIS — J343 Hypertrophy of nasal turbinates: Secondary | ICD-10-CM | POA: Diagnosis not present

## 2018-01-02 DIAGNOSIS — R05 Cough: Secondary | ICD-10-CM | POA: Diagnosis not present

## 2018-01-02 DIAGNOSIS — Z1389 Encounter for screening for other disorder: Secondary | ICD-10-CM | POA: Diagnosis not present

## 2018-01-02 DIAGNOSIS — J029 Acute pharyngitis, unspecified: Secondary | ICD-10-CM | POA: Diagnosis not present

## 2018-01-02 DIAGNOSIS — J069 Acute upper respiratory infection, unspecified: Secondary | ICD-10-CM | POA: Diagnosis not present

## 2018-01-02 DIAGNOSIS — R070 Pain in throat: Secondary | ICD-10-CM | POA: Diagnosis not present

## 2018-01-02 DIAGNOSIS — Z6841 Body Mass Index (BMI) 40.0 and over, adult: Secondary | ICD-10-CM | POA: Diagnosis not present

## 2018-02-27 DIAGNOSIS — K5289 Other specified noninfective gastroenteritis and colitis: Secondary | ICD-10-CM | POA: Diagnosis not present

## 2018-02-27 DIAGNOSIS — Z1389 Encounter for screening for other disorder: Secondary | ICD-10-CM | POA: Diagnosis not present

## 2018-02-27 DIAGNOSIS — Z6841 Body Mass Index (BMI) 40.0 and over, adult: Secondary | ICD-10-CM | POA: Diagnosis not present

## 2018-02-27 DIAGNOSIS — F411 Generalized anxiety disorder: Secondary | ICD-10-CM | POA: Diagnosis not present

## 2018-04-04 DIAGNOSIS — Z309 Encounter for contraceptive management, unspecified: Secondary | ICD-10-CM | POA: Diagnosis not present

## 2018-04-04 DIAGNOSIS — Z3040 Encounter for surveillance of contraceptives, unspecified: Secondary | ICD-10-CM | POA: Diagnosis not present

## 2018-04-04 DIAGNOSIS — A084 Viral intestinal infection, unspecified: Secondary | ICD-10-CM | POA: Diagnosis not present

## 2018-04-04 DIAGNOSIS — R1110 Vomiting, unspecified: Secondary | ICD-10-CM | POA: Diagnosis not present

## 2018-04-04 DIAGNOSIS — Z30012 Encounter for prescription of emergency contraception: Secondary | ICD-10-CM | POA: Diagnosis not present

## 2018-04-20 DIAGNOSIS — F411 Generalized anxiety disorder: Secondary | ICD-10-CM | POA: Diagnosis not present

## 2018-04-20 DIAGNOSIS — Z1389 Encounter for screening for other disorder: Secondary | ICD-10-CM | POA: Diagnosis not present

## 2018-04-20 DIAGNOSIS — Z6841 Body Mass Index (BMI) 40.0 and over, adult: Secondary | ICD-10-CM | POA: Diagnosis not present

## 2018-05-27 ENCOUNTER — Inpatient Hospital Stay (HOSPITAL_COMMUNITY)
Admission: AD | Admit: 2018-05-27 | Discharge: 2018-05-27 | Disposition: A | Discharge status: Home / Self Care | Payer: Medicaid - Out of State | Source: Ambulatory Visit | Attending: Obstetrics and Gynecology | Admitting: Obstetrics and Gynecology

## 2018-05-27 ENCOUNTER — Encounter (HOSPITAL_COMMUNITY): Payer: Self-pay | Admitting: *Deleted

## 2018-05-27 DIAGNOSIS — M549 Dorsalgia, unspecified: Secondary | ICD-10-CM | POA: Diagnosis not present

## 2018-05-27 DIAGNOSIS — R112 Nausea with vomiting, unspecified: Secondary | ICD-10-CM | POA: Insufficient documentation

## 2018-05-27 DIAGNOSIS — Z3201 Encounter for pregnancy test, result positive: Secondary | ICD-10-CM | POA: Insufficient documentation

## 2018-05-27 DIAGNOSIS — O0281 Inappropriate change in quantitative human chorionic gonadotropin (hCG) in early pregnancy: Secondary | ICD-10-CM | POA: Diagnosis not present

## 2018-05-27 LAB — URINALYSIS, ROUTINE W REFLEX MICROSCOPIC
Bilirubin Urine: NEGATIVE
Glucose, UA: NEGATIVE mg/dL
Hgb urine dipstick: NEGATIVE
Ketones, ur: NEGATIVE mg/dL
Leukocytes, UA: NEGATIVE
Nitrite: NEGATIVE
Protein, ur: NEGATIVE mg/dL
Specific Gravity, Urine: 1.025 (ref 1.005–1.030)
pH: 6 (ref 5.0–8.0)

## 2018-05-27 LAB — POCT PREGNANCY, URINE: Preg Test, Ur: POSITIVE — AB

## 2018-05-27 LAB — GLUCOSE, CAPILLARY: Glucose-Capillary: 98 mg/dL (ref 70–99)

## 2018-05-27 LAB — HCG, QUANTITATIVE, PREGNANCY: hCG, Beta Chain, Quant, S: 91 m[IU]/mL — ABNORMAL HIGH (ref <5)

## 2018-05-27 MED ORDER — METOCLOPRAMIDE HCL 10 MG PO TABS
10.0000 mg | ORAL_TABLET | Freq: Once | ORAL | Status: AC
  Administered 2018-05-27: 10 mg via ORAL
  Filled 2018-05-27: qty 1

## 2018-05-27 NOTE — MAU Note (Signed)
Janice Taylor is a 28 y.o.  here in MAU reporting:  +N/V: 3 emesis episodes in the past 24 hours LMP: States had a miscarriage last month. Has not had a period in 3 months. +HPT +lower back pain. Sharp. intermittent Onset of complaint: couple days Pain score: 2/10 Vitals:   05/27/18 1503  BP: (!) 169/84  Pulse: (!) 102  Resp: 19  Temp: (!) 97.5 F (36.4 C)  SpO2: 97%   Lab orders placed from triage: ua and pregnancy test

## 2018-05-27 NOTE — MAU Provider Note (Addendum)
History     CSN: [id number]  Arrival date and time: 05/27/18 1444   First Provider Initiated Contact with Patient 05/27/18 1532      Chief Complaint  Patient presents with  . Back Pain  . Emesis   Janice Taylor is a 28 y.o. G3P1011 at Unknown gestation who presents for Back Pain and Emesis. Patient presents today with complaint of N/V.  She states she had some vomiting today and took a pregnancy test which was positive.  However, she states that she has not had a period since November and had a miscarriage last month.  But patient is unable to say whether or not her hCG returned back to normal prior to her recent positive pregnancy test.  She reports some dark brown vaginal discharge 2 days ago, but no spotting since.  She denies vaginal concerns including itching, burning, odor, or discharge and declines a pelvic exam.  She also denies cramping or pain today and further states that she thinks the nausea and vomiting is just her "nerves."  Patient expresses disbelief regarding positive pregnancy test today stating "I didn't think I could get pregnant."       OB History    Gravida  2   Para  1   Term  1   Preterm      AB  1   Living  1     SAB  1   TAB      Ectopic      Multiple      Live Births  1           Past Medical History:  Diagnosis Date  . ADHD (attention deficit hyperactivity disorder)   . Anxiety   . Clotting disorder ([place])   . Cough   . Depression   . 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 Leg swelling   . Obesity 01/05/2013  . Thrombocytopenia ([place]) 11/25/2011   Consistent with ITP    Past Surgical History:  Procedure Laterality Date  . TONSILECTOMY, ADENOIDECTOMY, BILATERAL MYRINGOTOMY AND TUBES    . TONSILLECTOMY      Family History  Problem Relation Age of Onset  . Diabetes Maternal Uncle   . Kidney disease Maternal Uncle        renal failure/dialysis  . Heart attack Maternal Grandmother      Social History   Tobacco Use  . Smoking status: Never Smoker  . Smokeless tobacco: Never Used  Substance Use Topics  . Alcohol use: No  . Drug use: No    Allergies:  Allergies  Allergen Reactions  . Codeine Nausea And Vomiting  . Zofran [Ondansetron Hcl-Dextrose] Other (See Comments)    Not effective`    Medications Prior to Admission  Medication Sig Dispense Refill Last Dose  . ALPRAZolam (XANAX) 0.5 MG tablet Take 0.5 mg by mouth 2 (two) times daily as needed for anxiety.   Past Week at Unknown time  . ibuprofen (ADVIL,MOTRIN) 800 MG tablet Take 1 tablet (800 mg total) by mouth 3 (three) times daily. 21 tablet 0 Past Month at Unknown time  . sertraline (ZOLOFT) 25 MG tablet Take 25 mg by mouth daily.   05/26/2018 at Unknown time  . zolpidem (AMBIEN) 10 MG tablet Take 10 mg by mouth at bedtime as needed for sleep.   05/26/2018 at Unknown time  . acyclovir (ZOVIRAX) 400 MG tablet Take 1 tablet by mouth daily as needed.  0 More than a  month at Unknown time  . amphetamine-dextroamphetamine (ADDERALL XR) 30 MG 24 hr capsule Take 30 mg by mouth daily.   05/21/2017 at Unknown time  . celecoxib (CELEBREX) 50 MG capsule Take 50 mg by mouth 2 (two) times daily.     . cyclobenzaprine (FLEXERIL) 10 MG tablet Take 1 tablet (10 mg total) by mouth at bedtime. 5 tablet 0 More than a month at Unknown time  . Pediatric Multiple Vit-C-FA (FLINSTONES GUMMIES OMEGA-3 DHA) CHEW Chew 1 tablet by mouth daily.   05/22/2017 at Unknown time  . phentermine 37.5 MG capsule Take 37.5 mg by mouth every morning.       Review of Systems  Constitutional: Negative for chills and fever.  Gastrointestinal: Positive for nausea. Negative for abdominal pain, constipation and diarrhea.  Genitourinary: Negative for pelvic pain, vaginal bleeding and vaginal discharge.  Neurological: Negative for dizziness and light-headedness.   Physical Exam   Blood pressure (!) 117/47, pulse 91, temperature (!) 97.5 F (36.4 C),  temperature source Oral, resp. rate 18, weight (!) 162.8 kg, SpO2 97 %.  Physical Exam  Constitutional: She is oriented to person, place, and time. No distress.  Obese  HENT:  Head: Normocephalic and atraumatic.  Eyes: Conjunctivae are normal.  Neck: Normal range of motion.  Cardiovascular: Normal rate and regular rhythm.  Respiratory: Effort normal.  GI: Soft.  Genitourinary:    Genitourinary Comments: Declined Pelvic Exam   Musculoskeletal: Normal range of motion.  Neurological: She is alert and oriented to person, place, and time.  Skin: Skin is warm and dry.  Psychiatric: She has a normal mood and affect. Her behavior is normal.    MAU Course  Procedures Results for orders placed or performed during the hospital encounter of 05/27/18 (from the past 24 hour(s))  Urinalysis, Routine w reflex microscopic     Status: None   Collection Time: 05/27/18  3:04 PM  Result Value Ref Range   Color, Urine YELLOW YELLOW   APPearance CLEAR CLEAR   Specific Gravity, Urine 1.025 1.005 - 1.030   pH 6.0 5.0 - 8.0   Glucose, UA NEGATIVE NEGATIVE mg/dL   Hgb urine dipstick NEGATIVE NEGATIVE   Bilirubin Urine NEGATIVE NEGATIVE   Ketones, ur NEGATIVE NEGATIVE mg/dL   Protein, ur NEGATIVE NEGATIVE mg/dL   Nitrite NEGATIVE NEGATIVE   Leukocytes, UA NEGATIVE NEGATIVE  Pregnancy, urine POC     Status: Abnormal   Collection Time: 05/27/18  3:06 PM  Result Value Ref Range   Preg Test, Ur POSITIVE (A) NEGATIVE  Glucose, capillary     Status: None   Collection Time: 05/27/18  3:14 PM  Result Value Ref Range   Glucose-Capillary 98 70 - 99 mg/dL  hCG, quantitative, pregnancy     Status: Abnormal   Collection Time: 05/27/18  4:14 PM  Result Value Ref Range   hCG, Beta Chain, Quant, S 91 (H) <5 mIU/mL    MDM Labs: UPT, hCG, CBG   Assessment and Plan  +UPT N/V  -Discussed need to perform hCG to assess pregnancy hormone level. -Patient informed that it is possible that she still has  pregnancy hormones present from previous pregnancy. -Offered and accepts nausea medication. -Rx for Reglan given -hCG drawn and pending  Follow Up (5:12 PM) Early Pregnancy  -hCG results return significant for early pregnancy at ~[redacted] weeks gestation -Results discussed with patient who endorses unprotected sexual intercourse in the time frame. -Patient also reports taking a Plan B pill during that time frame  as well. -Educated on how increased weight results in decreased levels necessary for adequate efficacy. -Patient medications reviewed and patient questions if she should discontinue her Xanax and Zoloft. -Educated on risks vs benefits and informed okay to take now as needed, but should start to limit usage and if needed more frequently another med can be implemented. Instructed to continue Zoloft and Zovirax. -Instructed to discontinue usage of Ibuprofen/NSAIDs and patient reports no longer taking phentermine and adderall.   -Informed of need for follow up in [facility] clinic and then can follow up, per pt request, at [place]. -Scheduled for follow up hCG on Tuesday May 30, 2018 at 1400 -No other questions or concerns -Encouraged to call or return to MAU if symptoms worsen or with the onset of new symptoms. -Discharged to home in stable condition  Maryann Conners MSN, CNM 05/27/2018, 3:32 PM

## 2018-05-27 NOTE — Discharge Instructions (Signed)
First Trimester of Pregnancy  The first trimester of pregnancy is from week 1 until the end of week 13 (months 1 through 3). A week after a sperm fertilizes an egg, the egg will implant on the wall of the uterus. This embryo will begin to develop into a baby. Genes from you and your partner will form the baby. The female genes will determine whether the baby will be a boy or a girl. At 6-8 weeks, the eyes and face will be formed, and the heartbeat can be seen on ultrasound. At the end of 12 weeks, all the baby's organs will be formed.  Now that you are pregnant, you will want to do everything you can to have a healthy baby. Two of the most important things are to get good prenatal care and to follow your health care provider's instructions. Prenatal care is all the medical care you receive before the baby's birth. This care will help prevent, find, and treat any problems during the pregnancy and childbirth.  Body changes during your first trimester  Your body goes through many changes during pregnancy. The changes vary from woman to woman.   You may gain or lose a couple of pounds at first.   You may feel sick to your stomach (nauseous) and you may throw up (vomit). If the vomiting is uncontrollable, call your health care provider.   You may tire easily.   You may develop headaches that can be relieved by medicines. All medicines should be approved by your health care provider.   You may urinate more often. Painful urination may mean you have a bladder infection.   You may develop heartburn as a result of your pregnancy.   You may develop constipation because certain hormones are causing the muscles that push stool through your intestines to slow down.   You may develop hemorrhoids or swollen veins (varicose veins).   Your breasts may begin to grow larger and become tender. Your nipples may stick out more, and the tissue that surrounds them (areola) may become darker.   Your gums may bleed and may be  sensitive to brushing and flossing.   Dark spots or blotches (chloasma, mask of pregnancy) may develop on your face. This will likely fade after the baby is born.   Your menstrual periods will stop.   You may have a loss of appetite.   You may develop cravings for certain kinds of food.   You may have changes in your emotions from day to day, such as being excited to be pregnant or being concerned that something may go wrong with the pregnancy and baby.   You may have more vivid and strange dreams.   You may have changes in your hair. These can include thickening of your hair, rapid growth, and changes in texture. Some women also have hair loss during or after pregnancy, or hair that feels dry or thin. Your hair will most likely return to normal after your baby is born.  What to expect at prenatal visits  During a routine prenatal visit:   You will be weighed to make sure you and the baby are growing normally.   Your blood pressure will be taken.   Your abdomen will be measured to track your baby's growth.   The fetal heartbeat will be listened to between weeks 10 and 14 of your pregnancy.   Test results from any previous visits will be discussed.  Your health care provider may ask you:     How you are feeling.   If you are feeling the baby move.   If you have had any abnormal symptoms, such as leaking fluid, bleeding, severe headaches, or abdominal cramping.   If you are using any tobacco products, including cigarettes, chewing tobacco, and electronic cigarettes.   If you have any questions.  Other tests that may be performed during your first trimester include:   Blood tests to find your blood type and to check for the presence of any previous infections. The tests will also be used to check for low iron levels (anemia) and protein on red blood cells (Rh antibodies). Depending on your risk factors, or if you previously had diabetes during pregnancy, you may have tests to check for high blood sugar  that affects pregnant women (gestational diabetes).   Urine tests to check for infections, diabetes, or protein in the urine.   An ultrasound to confirm the proper growth and development of the baby.   Fetal screens for spinal cord problems (spina bifida) and Down syndrome.   HIV (human immunodeficiency virus) testing. Routine prenatal testing includes screening for HIV, unless you choose not to have this test.   You may need other tests to make sure you and the baby are doing well.  Follow these instructions at home:  Medicines   Follow your health care provider's instructions regarding medicine use. Specific medicines may be either safe or unsafe to take during pregnancy.   Take a prenatal vitamin that contains at least 600 micrograms (mcg) of folic acid.   If you develop constipation, try taking a stool softener if your health care provider approves.  Eating and drinking     Eat a balanced diet that includes fresh fruits and vegetables, whole grains, good sources of protein such as meat, eggs, or tofu, and low-fat dairy. Your health care provider will help you determine the amount of weight gain that is right for you.   Avoid raw meat and uncooked cheese. These carry germs that can cause birth defects in the baby.   Eating four or five small meals rather than three large meals a day may help relieve nausea and vomiting. If you start to feel nauseous, eating a few soda crackers can be helpful. Drinking liquids between meals, instead of during meals, also seems to help ease nausea and vomiting.   Limit foods that are high in fat and processed sugars, such as fried and sweet foods.   To prevent constipation:  ? Eat foods that are high in fiber, such as fresh fruits and vegetables, whole grains, and beans.  ? Drink enough fluid to keep your urine clear or pale yellow.  Activity   Exercise only as directed by your health care provider. Most women can continue their usual exercise routine during  pregnancy. Try to exercise for 30 minutes at least 5 days a week. Exercising will help you:  ? Control your weight.  ? Stay in shape.  ? Be prepared for labor and delivery.   Experiencing pain or cramping in the lower abdomen or lower back is a good sign that you should stop exercising. Check with your health care provider before continuing with normal exercises.   Try to avoid standing for long periods of time. Move your legs often if you must stand in one place for a long time.   Avoid heavy lifting.   Wear low-heeled shoes and practice good posture.   You may continue to have sex unless your health care   provider tells you not to.  Relieving pain and discomfort   Wear a good support bra to relieve breast tenderness.   Take warm sitz baths to soothe any pain or discomfort caused by hemorrhoids. Use hemorrhoid cream if your health care provider approves.   Rest with your legs elevated if you have leg cramps or low back pain.   If you develop varicose veins in your legs, wear support hose. Elevate your feet for 15 minutes, 3-4 times a day. Limit salt in your diet.  Prenatal care   Schedule your prenatal visits by the twelfth week of pregnancy. They are usually scheduled monthly at first, then more often in the last 2 months before delivery.   Write down your questions. Take them to your prenatal visits.   Keep all your prenatal visits as told by your health care provider. This is important.  Safety   Wear your seat belt at all times when driving.   Make a list of emergency phone numbers, including numbers for family, friends, the hospital, and police and fire departments.  General instructions   Ask your health care provider for a referral to a local prenatal education class. Begin classes no later than the beginning of month 6 of your pregnancy.   Ask for help if you have counseling or nutritional needs during pregnancy. Your health care provider can offer advice or refer you to specialists for help  with various needs.   Do not use hot tubs, steam rooms, or saunas.   Do not douche or use tampons or scented sanitary pads.   Do not cross your legs for long periods of time.   Avoid cat litter boxes and soil used by cats. These carry germs that can cause birth defects in the baby and possibly loss of the fetus by miscarriage or stillbirth.   Avoid all smoking, herbs, alcohol, and medicines not prescribed by your health care provider. Chemicals in these products affect the formation and growth of the baby.   Do not use any products that contain nicotine or tobacco, such as cigarettes and e-cigarettes. If you need help quitting, ask your health care provider. You may receive counseling support and other resources to help you quit.   Schedule a dentist appointment. At home, brush your teeth with a soft toothbrush and be gentle when you floss.  Contact a health care provider if:   You have dizziness.   You have mild pelvic cramps, pelvic pressure, or nagging pain in the abdominal area.   You have persistent nausea, vomiting, or diarrhea.   You have a bad smelling vaginal discharge.   You have pain when you urinate.   You notice increased swelling in your face, hands, legs, or ankles.   You are exposed to fifth disease or chickenpox.   You are exposed to German measles (rubella) and have never had it.  Get help right away if:   You have a fever.   You are leaking fluid from your vagina.   You have spotting or bleeding from your vagina.   You have severe abdominal cramping or pain.   You have rapid weight gain or loss.   You vomit blood or material that looks like coffee grounds.   You develop a severe headache.   You have shortness of breath.   You have any kind of trauma, such as from a fall or a car accident.  Summary   The first trimester of pregnancy is from week 1 until   the end of week 13 (months 1 through 3).   Your body goes through many changes during pregnancy. The changes vary from  woman to woman.   You will have routine prenatal visits. During those visits, your health care provider will examine you, discuss any test results you may have, and talk with you about how you are feeling.  This information is not intended to replace advice given to you by your health care provider. Make sure you discuss any questions you have with your health care provider.  Document Released: 04/06/2001 Document Revised: 03/24/2016 Document Reviewed: 03/24/2016  Elsevier Interactive Patient Education  2019 [employer]

## 2018-05-30 ENCOUNTER — Ambulatory Visit (INDEPENDENT_AMBULATORY_CARE_PROVIDER_SITE_OTHER): Payer: BLUE CROSS/BLUE SHIELD | Admitting: General Practice

## 2018-05-30 DIAGNOSIS — O3680X0 Pregnancy with inconclusive fetal viability, not applicable or unspecified: Secondary | ICD-10-CM

## 2018-05-30 DIAGNOSIS — Z8759 Personal history of other complications of pregnancy, childbirth and the puerperium: Secondary | ICD-10-CM

## 2018-05-30 LAB — HCG, QUANTITATIVE, PREGNANCY: hCG, Beta Chain, Quant, S: 292 m[IU]/mL — ABNORMAL HIGH (ref <5)

## 2018-05-30 NOTE — Progress Notes (Signed)
Patient presents to office today for stat bhcg following recent MAU visit. Patient reports occasional back pain and pain in her lower pelvis/groin. Patient denies bleeding. Discussed with patient we are monitoring your bhcg levels today & asked she wait in lobby for results/updated plan of care. Patient verbalized understanding & had no questions at this time.  Reviewed results with Dr Ilda Basset who finds appropriate rise in bhcg levels, patient should follow up ultrasound in 1 week.   Informed patient of results & scheduled ultrasound 2/13. Patient will follow up in office for results. Ectopic precautions reviewed.   Koren Bound RN BSN 05/30/18

## 2018-06-01 NOTE — Progress Notes (Signed)
I have reviewed the chart and agree with nursing staff's documentation of this patient's encounter.  Aletha Halim, MD 06/01/2018 4:57 PM

## 2018-06-08 ENCOUNTER — Ambulatory Visit (HOSPITAL_COMMUNITY)
Admission: RE | Admit: 2018-06-08 | Discharge: 2018-06-08 | Disposition: A | Discharge status: Home / Self Care | Payer: Medicaid - Out of State | Source: Ambulatory Visit | Attending: Obstetrics and Gynecology | Admitting: Obstetrics and Gynecology

## 2018-06-08 ENCOUNTER — Ambulatory Visit (INDEPENDENT_AMBULATORY_CARE_PROVIDER_SITE_OTHER): Payer: BLUE CROSS/BLUE SHIELD | Admitting: *Deleted

## 2018-06-08 ENCOUNTER — Encounter: Payer: Self-pay | Admitting: Family Medicine

## 2018-06-08 DIAGNOSIS — Z8759 Personal history of other complications of pregnancy, childbirth and the puerperium: Secondary | ICD-10-CM | POA: Diagnosis not present

## 2018-06-08 DIAGNOSIS — O3680X0 Pregnancy with inconclusive fetal viability, not applicable or unspecified: Secondary | ICD-10-CM

## 2018-06-08 DIAGNOSIS — Z3A01 Less than 8 weeks gestation of pregnancy: Secondary | ICD-10-CM | POA: Diagnosis not present

## 2018-06-08 NOTE — Progress Notes (Signed)
Here for [place] results which were reviewed with Dr. Roselie Awkward . Informed patient [place] does not yet confirm live pregnancy, we do see gestational sac. We do recommend repeat US in 2 weeks . She voices understanding and agreed to [place] 06/23/18 and then come to our office for results. She also asked for refills of xanax and ambien. I informed her since we have not seen her in office for prenatal care I will forward request to provider who saw her in MAU. She voices understanding.  Linda,RN

## 2018-06-09 ENCOUNTER — Telehealth: Payer: Self-pay | Admitting: *Deleted

## 2018-06-09 DIAGNOSIS — G4709 Other insomnia: Secondary | ICD-10-CM

## 2018-06-09 MED ORDER — ZOLPIDEM TARTRATE 5 MG PO TABS: ORAL_TABLET | ORAL | 0 refills | Status: DC

## 2018-06-09 NOTE — Progress Notes (Signed)
Patient ID: Janice Taylor, female   DOB: Jul 14, 1990, 28 y.o.   MRN: [id number] I have reviewed the chart and agree with nursing staff's documentation of this patient's encounter.  Emeterio Reeve, MD 06/09/2018 10:28 AM

## 2018-06-09 NOTE — Telephone Encounter (Signed)
See documentation for another telephone call for 06/09/18 for documentation for this .

## 2018-06-09 NOTE — Telephone Encounter (Signed)
I called Janice Taylor and answered her questions re: her visit yesterday and explained [place] results . I also informed her per message from provider that she will not refill xanax and she should contact PCp or who wrote it. I also informed her she approved ambien rx and sent that in to her pharmacy.

## 2018-06-09 NOTE — Telephone Encounter (Signed)
Janice Taylor called and states she has questions re: her visit yesterday.

## 2018-06-09 NOTE — Telephone Encounter (Signed)
-----   Message from [place], [place] sent at 06/09/2018 10:14 AM EST ----- Regarding: RE: refill of xanax No I will not refill her Xanax as she needs to start weaning as we discussed in the MAU. She can contact her PCP who gave her the prescription if she absolutely feels she needs it.  I am okay with the Ambien 5mg  at HS prn Disp 30 RF 0 if you could call/send that in.  Thanks,  JE ----- Message ----- From: Samuel Germany, RN Sent: 06/08/2018   2:47 PM EST To: Gavin Pound, CNM Subject: refill of xanax                                Jehan has not started prenatal care yet but saw you in [place]. She has came to [place] for [place] and we are doing another because do not yet see embryo. She would like to know if you will refill her Lorrin Mais and xanax for  a month supply.   Linda,RN

## 2018-06-13 ENCOUNTER — Encounter (HOSPITAL_COMMUNITY): Payer: Self-pay | Admitting: *Deleted

## 2018-06-13 ENCOUNTER — Encounter (HOSPITAL_COMMUNITY): Payer: Self-pay

## 2018-06-13 ENCOUNTER — Inpatient Hospital Stay (HOSPITAL_COMMUNITY): Payer: Medicaid - Out of State

## 2018-06-13 ENCOUNTER — Other Ambulatory Visit: Payer: Self-pay

## 2018-06-13 ENCOUNTER — Emergency Department (HOSPITAL_COMMUNITY)
Admission: EM | Admit: 2018-06-13 | Discharge: 2018-06-13 | Disposition: A | Discharge status: Home / Self Care | Payer: Medicaid - Out of State | Attending: Emergency Medicine | Admitting: Emergency Medicine

## 2018-06-13 ENCOUNTER — Inpatient Hospital Stay (EMERGENCY_DEPARTMENT_HOSPITAL)
Admission: AD | Admit: 2018-06-13 | Discharge: 2018-06-13 | Disposition: A | Discharge status: Home / Self Care | Payer: Medicaid - Out of State | Source: Home / Self Care | Attending: Obstetrics and Gynecology | Admitting: Obstetrics and Gynecology

## 2018-06-13 DIAGNOSIS — F32A Depression, unspecified: Secondary | ICD-10-CM

## 2018-06-13 DIAGNOSIS — R109 Unspecified abdominal pain: Secondary | ICD-10-CM | POA: Insufficient documentation

## 2018-06-13 DIAGNOSIS — F419 Anxiety disorder, unspecified: Secondary | ICD-10-CM | POA: Insufficient documentation

## 2018-06-13 DIAGNOSIS — Z5321 Procedure and treatment not carried out due to patient leaving prior to being seen by health care provider: Secondary | ICD-10-CM | POA: Diagnosis not present

## 2018-06-13 DIAGNOSIS — O209 Hemorrhage in early pregnancy, unspecified: Secondary | ICD-10-CM

## 2018-06-13 DIAGNOSIS — O26899 Other specified pregnancy related conditions, unspecified trimester: Secondary | ICD-10-CM

## 2018-06-13 DIAGNOSIS — O26891 Other specified pregnancy related conditions, first trimester: Secondary | ICD-10-CM

## 2018-06-13 DIAGNOSIS — F329 Major depressive disorder, single episode, unspecified: Secondary | ICD-10-CM

## 2018-06-13 DIAGNOSIS — O99341 Other mental disorders complicating pregnancy, first trimester: Secondary | ICD-10-CM | POA: Insufficient documentation

## 2018-06-13 DIAGNOSIS — Z3491 Encounter for supervision of normal pregnancy, unspecified, first trimester: Secondary | ICD-10-CM

## 2018-06-13 DIAGNOSIS — Z3A01 Less than 8 weeks gestation of pregnancy: Secondary | ICD-10-CM | POA: Insufficient documentation

## 2018-06-13 DIAGNOSIS — O9989 Other specified diseases and conditions complicating pregnancy, childbirth and the puerperium: Secondary | ICD-10-CM | POA: Insufficient documentation

## 2018-06-13 LAB — URINALYSIS, ROUTINE W REFLEX MICROSCOPIC
Bilirubin Urine: NEGATIVE
Glucose, UA: NEGATIVE mg/dL
Ketones, ur: NEGATIVE mg/dL
Leukocytes,Ua: NEGATIVE
Nitrite: NEGATIVE
Protein, ur: NEGATIVE mg/dL
Specific Gravity, Urine: 1.01 (ref 1.005–1.030)
pH: 5.5 (ref 5.0–8.0)

## 2018-06-13 LAB — URINALYSIS, MICROSCOPIC (REFLEX): WBC, UA: NONE SEEN WBC/hpf (ref 0–5)

## 2018-06-13 LAB — WET PREP, GENITAL
Clue Cells Wet Prep HPF POC: NONE SEEN
Sperm: NONE SEEN
Trich, Wet Prep: NONE SEEN
Yeast Wet Prep HPF POC: NONE SEEN

## 2018-06-13 MED ORDER — METOCLOPRAMIDE HCL 10 MG PO TABS: 10.0000 mg | ORAL_TABLET | Freq: Three times a day (TID) | ORAL | 0 refills | Status: DC | PRN

## 2018-06-13 MED ORDER — HYDROXYZINE HCL 25 MG PO TABS: 25.0000 mg | ORAL_TABLET | Freq: Four times a day (QID) | ORAL | 1 refills | Status: DC | PRN

## 2018-06-13 MED ORDER — SERTRALINE HCL 25 MG PO TABS: 25.0000 mg | ORAL_TABLET | Freq: Every day | ORAL | 1 refills | Status: DC

## 2018-06-13 NOTE — Discharge Instructions (Signed)
Vaginal Bleeding During Pregnancy, First Trimester ° °A small amount of bleeding from the vagina (spotting) is relatively common during early pregnancy. It usually stops on its own. Various things may cause bleeding or spotting during early pregnancy. Some bleeding may be related to the pregnancy, and some may not. In many cases, the bleeding is normal and is not a problem. However, bleeding can also be a sign of something serious. Be sure to tell your health care provider about any vaginal bleeding right away. °Some possible causes of vaginal bleeding during the first trimester include: °· Infection or inflammation of the cervix. °· Growths (polyps) on the cervix. °· Miscarriage or threatened miscarriage. °· Pregnancy tissue developing outside of the uterus (ectopic pregnancy). °· A mass of tissue developing in the uterus due to an egg being fertilized incorrectly (molar pregnancy). °Follow these instructions at home: °Activity °· Follow instructions from your health care provider about limiting your activity. Ask what activities are safe for you. °· If needed, make plans for someone to help with your regular activities. °· Do not have sex or orgasms until your health care provider says that this is safe. °General instructions °· Take over-the-counter and prescription medicines only as told by your health care provider. °· Pay attention to any changes in your symptoms. °· Do not use tampons or douche. °· Write down how many pads you use each day, how often you change pads, and how soaked (saturated) they are. °· If you pass any tissue from your vagina, save the tissue so you can show it to your health care provider. °· Keep all follow-up visits as told by your health care provider. This is important. °Contact a health care provider if: °· You have vaginal bleeding during any part of your pregnancy. °· You have cramps or labor pains. °· You have a fever. °Get help right away if: °· You have severe cramps in your  back or abdomen. °· You pass large clots or a large amount of tissue from your vagina. °· Your bleeding increases. °· You feel light-headed or weak, or you faint. °· You have chills. °· You are leaking fluid or have a gush of fluid from your vagina. °Summary °· A small amount of bleeding (spotting) from the vagina is relatively common during early pregnancy. °· Various things may cause bleeding or spotting in early pregnancy. °· Be sure to tell your health care provider about any vaginal bleeding right away. °This information is not intended to replace advice given to you by your health care provider. Make sure you discuss any questions you have with your health care provider. °Document Released: 01/20/2005 Document Revised: 07/15/2016 Document Reviewed: 07/15/2016 °Elsevier Interactive Patient Education © 2019 Elsevier Inc. ° °

## 2018-06-13 NOTE — MAU Provider Note (Addendum)
History     CSN: [id number]  Arrival date and time: 06/13/18 1420   First Provider Initiated Contact with Patient 06/13/18 1508      Chief Complaint  Patient presents with  . Abdominal Pain  . Vaginal Bleeding   HPI  Patient is a G3P1011 at [redacted]w[redacted]d who presents with vaginal discharge and abdominal cramping.  She notes that the abdominal cramping involves the right lower quadrant and left lower quadrant. The cramping began last night and has continued throughout the day.  She rates the pain as a 6/10 in severity.  She has not taken anything for the pain.  She also notes vaginal discharge identified on the toilet paper while wiping this morning.  She states that the discharge was pink in color.  She notes one additional episode of blood on toilet paper today.  She denies use of a pad or tampon.  She is sexually active, and notes that her last intercourse was this morning after the discharge began.     She also reports a significant amount of anxiety associated with her symptoms.  Patient has a history of miscarriage, and is concerned about the possibility of miscarriage with this pregnancy.  She reports tachypnea and palpitations this morning.  She has a history of anxiety which was previously controlled with sertraline 25 mg and Alprazolam 0.5 mg BID.  The patient was seen on 05/27/2018 and advised to discontinue treatment with Alprazolam.  Patient discontinued treatment of both sertraline and Alprazolam after that visit.  She has a significant amount of stress at work, and notes that her anxiety symptoms have been worsening since discontinuing treatment.  She denies treatment with therapy.    The patient also reports vulvar itching and irritation which began last week.  She denies any vaginal discharge during this episode.  She treated the irritation with clotrimazole betamethasone that she had been previously prescribed.  She reports that this treatment was effective, and denies any irritation  today.  Patient is receiving prenatal care at the [facility].  On 06/08/2018, she had a transvaginal ultrasound which demonstrated a single intrauterine gestational sac without a visible embryo.  Patient was advised to follow up in 14 days to assess presence of fetal pole.    Past Medical History:  Diagnosis Date  . ADHD (attention deficit hyperactivity disorder)   . Anxiety   . Clotting disorder ([place])   . Cough   . Depression   . 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 Leg swelling   . Obesity 01/05/2013  . Thrombocytopenia ([place]) 11/25/2011   Consistent with ITP    Past Surgical History:  Procedure Laterality Date  . TONSILECTOMY, ADENOIDECTOMY, BILATERAL MYRINGOTOMY AND TUBES      Family History  Problem Relation Age of Onset  . Diabetes Maternal Uncle   . Kidney disease Maternal Uncle        renal failure/dialysis  . Heart attack Maternal Grandmother     Social History   Tobacco Use  . Smoking status: Never Smoker  . Smokeless tobacco: Never Used  Substance Use Topics  . Alcohol use: No  . Drug use: No    Allergies:  Allergies  Allergen Reactions  . Codeine Nausea And Vomiting  . Ondansetron Hcl Other (See Comments)    Not effective`    No medications prior to admission.    Review of Systems  Cardiovascular: Negative for chest pain.  Gastrointestinal: Positive for abdominal pain.  Negative for nausea and vomiting.  Genitourinary: Positive for pelvic pain and vaginal discharge.  Psychiatric/Behavioral: The patient is nervous/anxious.    Physical Exam   Blood pressure (!) 151/82, pulse 96, temperature 99 F (37.2 C), temperature source Oral, resp. rate 16, height 5\' 4"  (1.626 m), weight (!) 362 lb (164.2 kg), SpO2 99 %.  Physical Exam  Constitutional: She is oriented to person, place, and time. She appears well-developed and well-nourished.  Cardiovascular: Normal rate and regular rhythm. Exam  reveals no gallop and no friction rub.  No murmur heard. Respiratory: Effort normal and breath sounds normal. No respiratory distress. She has no wheezes. She has no rales.  Genitourinary: Cervix exhibits no motion tenderness.  Neurological: She is alert and oriented to person, place, and time. She has normal reflexes.  Skin: Skin is warm and dry.  Psychiatric: Thought content normal.    MAU Course  Procedures Results for orders placed or performed during the hospital encounter of 06/13/18 (from the past 24 hour(s))  Urinalysis, Routine w reflex microscopic     Status: Abnormal   Collection Time: 06/13/18  2:35 PM  Result Value Ref Range   Color, Urine YELLOW YELLOW   APPearance CLEAR CLEAR   Specific Gravity, Urine 1.010 1.005 - 1.030   pH 5.5 5.0 - 8.0   Glucose, UA NEGATIVE NEGATIVE mg/dL   Hgb urine dipstick TRACE (A) NEGATIVE   Bilirubin Urine NEGATIVE NEGATIVE   Ketones, ur NEGATIVE NEGATIVE mg/dL   Protein, ur NEGATIVE NEGATIVE mg/dL   Nitrite NEGATIVE NEGATIVE   Leukocytes,Ua NEGATIVE NEGATIVE  Urinalysis, Microscopic (reflex)     Status: Abnormal   Collection Time: 06/13/18  2:35 PM  Result Value Ref Range   RBC / HPF 0-5 0 - 5 RBC/hpf   WBC, UA NONE SEEN 0 - 5 WBC/hpf   Bacteria, UA RARE (A) NONE SEEN   Squamous Epithelial / LPF 0-5 0 - 5  Wet prep, genital     Status: Abnormal   Collection Time: 06/13/18  3:45 PM  Result Value Ref Range   Yeast Wet Prep HPF POC NONE SEEN NONE SEEN   Trich, Wet Prep NONE SEEN NONE SEEN   Clue Cells Wet Prep HPF POC NONE SEEN NONE SEEN   WBC, Wet Prep HPF POC FEW (A) NONE SEEN   Sperm NONE SEEN     MDM Patient presents with vaginal bleeding during pregnancy.  Transvaginal ultrasound performed and correlates with intrauterine pregnancy.    GC/Chlamydia and wet prep performed to rule out STI.  Anxiety treatment options discussed with patient.  She was encouraged to begin seeing a therapist due to hormonal changes in pregnancy.   In addition, she was instructed to resume treatment with Sertraline 25 mg.  She was also prescribed hydroxyzine and instructed on proper use.   Assessment and Plan  A:  Intrauterine pregnancy at [redacted]w[redacted]d Anxiety  P: Prescribed Sertraline 25 mg QD Prescribed Hydroxyzine  Encouraged cognitive behavioral therapy Refill for Reglan for patient's nausea Patient will follow up with routine prenatal care as scheduled. Advised to return if bleeding worsens.  Suzanna Obey 06/13/2018, 3:30 PM     I confirm that I have verified the information documented in the physician assistant student's note and that I have also personally reperformed the history, physical exam and all medical decision making activities of this service and have verified that all service and findings are accurately documented in this student's note.   Patient with no blood noted on  exam & cervix closed. RH positive. Ultrasound shows IUP with cardiac activity.   Discussed tx of anxiety/depression during pregnancy. Refilled zoloft prescription & given new rx for vistaril prn anxiety. Encouraged patient to f/u with Roselyn Reef downstairs & to seek outpatient counseling. Pt agreeable with this plan.   Some elevated BPs today. Pt denies hx of hypertension. Will defer starting antihypertensives today.   Jorje Guild, NP 06/13/2018 8:03 PM

## 2018-06-13 NOTE — MAU Note (Signed)
Pt reports spotting and cramping since this am. States she had a miscarriage the beginning of January and then had a positive preg test the beginning of February. Was told to go off all of her anxiety meds and she feels like she is going to have an anxiety attack.

## 2018-06-13 NOTE — ED Notes (Signed)
Pt taken off of Alprazolam recently by her doctor.

## 2018-06-13 NOTE — ED Triage Notes (Signed)
Pt started bleeding this morning and is [redacted] weeks pregnant. Has been having cramping as well. Had a miscarriage at the beginning of the year. Is also having a panic attack due to anxiety. Pt is only bleeding minimally.

## 2018-06-15 LAB — GC/CHLAMYDIA PROBE AMP (~~LOC~~) NOT AT ARMC
Chlamydia: NEGATIVE
Neisseria Gonorrhea: NEGATIVE

## 2018-06-23 ENCOUNTER — Ambulatory Visit: Payer: BLUE CROSS/BLUE SHIELD | Admitting: *Deleted

## 2018-06-23 ENCOUNTER — Ambulatory Visit (HOSPITAL_COMMUNITY): Payer: BLUE CROSS/BLUE SHIELD

## 2018-06-23 DIAGNOSIS — O209 Hemorrhage in early pregnancy, unspecified: Secondary | ICD-10-CM

## 2018-06-23 NOTE — Progress Notes (Signed)
Was arrrived for [place] results but had not had [place]. Per schedule [place] was cancelled. patient states she was upstairs at 16 and they told her [place] cancelled; she came and registered here . I called [place] and they can not do [place] now as her original appt was 9am and they cannot do add on.  I scheduled for first available appointment that patient states she can come to around her work schedule and driving from [place], which is 07/05/18.. I apologized to patient for all the confusion. She states she may keep appointment or she may change to another doctor. Linda,RN  Addendeum:  11:41  Per further review found that patient went to [facility] again 06/13/18 with vaginal bleeding and had another [place] which verified pregnancy and so  [place] for 06/24/27 was cancelled per that provider Junie Panning Lawurence,NP ). I called [name] and explained this to her and that we are cancelling [place] for 07/05/18 and that she should start prenatal care. She states she would like to come here , I informed her I will message our registrars and they will call her next week for an appt around 10-12 weeks. She voices understanding.  Linda,RN

## 2018-06-25 NOTE — Progress Notes (Signed)
I have reviewed the chart and agree with nursing staff's documentation of this patient's encounter.  Verita Schneiders, MD 06/25/2018 9:10 AM

## 2018-06-28 ENCOUNTER — Telehealth: Payer: Self-pay | Admitting: Obstetrics & Gynecology

## 2018-06-28 NOTE — Telephone Encounter (Signed)
Spoke to patient and she states she lives in [place] and wants to come to our office for [facility].  Informed she is scheduled on 3/26 with Dr Rosana Hoes so she needs to cancel that appt if she is coming her.  Pt verbalized understanding and New OB appt made.

## 2018-06-28 NOTE — Telephone Encounter (Signed)
New patient would like to come here for her pregnancy.  Found out she was pregnant on a recent [facility].  Please review and advise what we needed to schedule.  [phone]

## 2018-07-05 ENCOUNTER — Ambulatory Visit: Payer: BLUE CROSS/BLUE SHIELD

## 2018-07-05 ENCOUNTER — Ambulatory Visit: Payer: BLUE CROSS/BLUE SHIELD | Admitting: *Deleted

## 2018-07-05 ENCOUNTER — Ambulatory Visit (HOSPITAL_COMMUNITY): Payer: BLUE CROSS/BLUE SHIELD

## 2018-07-05 ENCOUNTER — Encounter: Payer: Self-pay | Admitting: Advanced Practice Midwife

## 2018-07-05 ENCOUNTER — Other Ambulatory Visit: Payer: Self-pay

## 2018-07-05 ENCOUNTER — Ambulatory Visit (INDEPENDENT_AMBULATORY_CARE_PROVIDER_SITE_OTHER): Payer: BLUE CROSS/BLUE SHIELD | Admitting: Advanced Practice Midwife

## 2018-07-05 VITALS — BP 120/80 | HR 80 | Wt 361.0 lb

## 2018-07-05 DIAGNOSIS — O99211 Obesity complicating pregnancy, first trimester: Secondary | ICD-10-CM

## 2018-07-05 DIAGNOSIS — Z6841 Body Mass Index (BMI) 40.0 and over, adult: Secondary | ICD-10-CM

## 2018-07-05 DIAGNOSIS — O26891 Other specified pregnancy related conditions, first trimester: Secondary | ICD-10-CM

## 2018-07-05 DIAGNOSIS — O0990 Supervision of high risk pregnancy, unspecified, unspecified trimester: Secondary | ICD-10-CM | POA: Insufficient documentation

## 2018-07-05 DIAGNOSIS — Z1389 Encounter for screening for other disorder: Secondary | ICD-10-CM

## 2018-07-05 DIAGNOSIS — Z3A09 9 weeks gestation of pregnancy: Secondary | ICD-10-CM

## 2018-07-05 DIAGNOSIS — G4709 Other insomnia: Secondary | ICD-10-CM

## 2018-07-05 DIAGNOSIS — Z3481 Encounter for supervision of other normal pregnancy, first trimester: Secondary | ICD-10-CM

## 2018-07-05 DIAGNOSIS — Z3490 Encounter for supervision of normal pregnancy, unspecified, unspecified trimester: Secondary | ICD-10-CM

## 2018-07-05 DIAGNOSIS — Z131 Encounter for screening for diabetes mellitus: Secondary | ICD-10-CM

## 2018-07-05 DIAGNOSIS — Z1371 Encounter for nonprocreative screening for genetic disease carrier status: Secondary | ICD-10-CM

## 2018-07-05 DIAGNOSIS — Z331 Pregnant state, incidental: Secondary | ICD-10-CM

## 2018-07-05 DIAGNOSIS — Z3682 Encounter for antenatal screening for nuchal translucency: Secondary | ICD-10-CM

## 2018-07-05 LAB — POCT URINALYSIS DIPSTICK OB
Blood, UA: NEGATIVE
Glucose, UA: NEGATIVE
Ketones, UA: NEGATIVE
Leukocytes, UA: NEGATIVE
Nitrite, UA: NEGATIVE
POC,PROTEIN,UA: NEGATIVE

## 2018-07-05 MED ORDER — ZOLPIDEM TARTRATE 5 MG PO TABS: ORAL_TABLET | ORAL | 0 refills | Status: DC

## 2018-07-05 NOTE — Progress Notes (Signed)
INITIAL OBSTETRICAL VISIT Patient name: Janice Taylor MRN [id number]  Date of birth: 11-Sep-1990 Chief Complaint:   Initial Prenatal Visit  History of Present Illness:   Janice Taylor is a 28 y.o. G76P1011  female at [redacted]w[redacted]d by early [place] with an Estimated Date of Delivery: 02/03/19 being seen today for her initial obstetrical visit.   Her obstetrical history is significant for excessive weight gain.  Gained 70# since June 2019. Had early SAB 11/19. Had ITP 2013, nl plts since.  Today she reports stopped al meds except ambien and reglan. Needs refill on ambien.  Patient's last menstrual period was 04/29/2018. Last pap 1/20. Results were: normal Review of Systems:   Pertinent items are noted in HPI Denies cramping/contractions, leakage of fluid, vaginal bleeding, abnormal vaginal discharge w/ itching/odor/irritation, headaches, visual changes, shortness of breath, chest pain, abdominal pain, severe nausea/vomiting, or problems with urination or bowel movements unless otherwise stated above.  Pertinent History Reviewed:  Reviewed past medical,surgical, social, obstetrical and family history.  Reviewed problem list, medications and allergies. OB History  Gravida Para Term Preterm AB Living  3 1 1   1 1   SAB TAB Ectopic Multiple Live Births  1       1    # Outcome Date GA Lbr Len/2nd Weight Sex Delivery Anes PTL Lv  3 Current           2 SAB 2019          1 Term 08/03/07 107w0d  6 lb 14 oz (3.118 kg) F Vag-Spont EPI  LIV   Physical Assessment:   Vitals:   07/05/18 1441  BP: 120/80  Pulse: 80  Weight: (!) 361 lb (163.7 kg)  Body mass index is 61.97 kg/m.       Physical Examination:  General appearance - well appearing, and in no distress  Mental status - alert, oriented to person, place, and time  Psych:  She has a normal mood and affect  Skin - warm and dry, normal color, no suspicious lesions noted  Chest - effort normal, all lung fields clear to auscultation bilaterally  Heart - normal  rate and regular rhythm  Abdomen - soft, nontender  Extremities:  No swelling or varicosities noted   Pelvic - VULVA: normal appearing vulva with no masses, tenderness or lesions  VAGINA: normal appearing vagina with normal color and discharge, no lesions     via [place]  Results for orders placed or performed in visit on 07/05/18 (from the past 24 hour(s))  POC Urinalysis Dipstick OB   Collection Time: 07/05/18  3:01 PM  Result Value Ref Range   Color, UA     Clarity, UA     Glucose, UA Negative Negative   Bilirubin, UA     Ketones, UA neg    Spec Grav, UA     Blood, UA neg    pH, UA     POC,PROTEIN,UA Negative Negative, Trace, Small (1+), Moderate (2+), Large (3+), 4+   Urobilinogen, UA     Nitrite, UA neg    Leukocytes, UA Negative Negative   Appearance     Odor      Assessment & Plan:  1) Low-Risk Pregnancy G3P1011 at [redacted]w[redacted]d with an Estimated Date of Delivery: 02/03/19   2) Initial OB visit  3) Morbid obesity_  hgb A1c added   Meds: No orders of the defined types were placed in this encounter.   Initial labs obtained Continue prenatal vitamins Reviewed n/v relief measures and  warning s/s to report Reviewed recommended weight gain based on pre-gravid BMI (0-10 lbs) Encouraged well-balanced diet Watched video for carrier screening/genetic testing:  Genetic Screening discussed Integrated Screen: requested Cystic fibrosis screening declined SMA screening declined Fragile X screening declined Ultrasound discussed; fetal survey: requested Gu-Win completed  Follow-up: Return in about 4 weeks (around 08/02/2018) for LROB, US:NT+1st IT and Mat 21.   Orders Placed This Encounter  Procedures  . GC/Chlamydia Probe Amp  . Urine Culture  . US Fetal Nuchal Translucency Measurement  . Obstetric Panel, Including HIV  . Urinalysis, Routine w reflex microscopic  . Sickle cell screen  . Inheritest Core(CF97,SMA,FraX)  . Pain Management Screening Profile (10S)  . POC Urinalysis  Dipstick OB    Christin Fudge DNP, CNM 07/05/2018 3:17 PM

## 2018-07-05 NOTE — Patient Instructions (Addendum)
 First Trimester of Pregnancy The first trimester of pregnancy is from week 1 until the end of week 12 (months 1 through 3). A week after a sperm fertilizes an egg, the egg will implant on the wall of the uterus. This embryo will begin to develop into a baby. Genes from you and your partner are forming the baby. The female genes determine whether the baby is a boy or a girl. At 6-8 weeks, the eyes and face are formed, and the heartbeat can be seen on ultrasound. At the end of 12 weeks, all the baby's organs are formed.  Now that you are pregnant, you will want to do everything you can to have a healthy baby. Two of the most important things are to get good prenatal care and to follow your health care provider's instructions. Prenatal care is all the medical care you receive before the baby's birth. This care will help prevent, find, and treat any problems during the pregnancy and childbirth. BODY CHANGES Your body goes through many changes during pregnancy. The changes vary from woman to woman.   You may gain or lose a couple of pounds at first.  You may feel sick to your stomach (nauseous) and throw up (vomit). If the vomiting is uncontrollable, call your health care provider.  You may tire easily.  You may develop headaches that can be relieved by medicines approved by your health care provider.  You may urinate more often. Painful urination may mean you have a bladder infection.  You may develop heartburn as a result of your pregnancy.  You may develop constipation because certain hormones are causing the muscles that push waste through your intestines to slow down.  You may develop hemorrhoids or swollen, bulging veins (varicose veins).  Your breasts may begin to grow larger and become tender. Your nipples may stick out more, and the tissue that surrounds them (areola) may become darker.  Your gums may bleed and may be sensitive to brushing and flossing.  Dark spots or blotches  (chloasma, mask of pregnancy) may develop on your face. This will likely fade after the baby is born.  Your menstrual periods will stop.  You may have a loss of appetite.  You may develop cravings for certain kinds of food.  You may have changes in your emotions from day to day, such as being excited to be pregnant or being concerned that something may go wrong with the pregnancy and baby.  You may have more vivid and strange dreams.  You may have changes in your hair. These can include thickening of your hair, rapid growth, and changes in texture. Some women also have hair loss during or after pregnancy, or hair that feels dry or thin. Your hair will most likely return to normal after your baby is born. WHAT TO EXPECT AT YOUR PRENATAL VISITS During a routine prenatal visit:  You will be weighed to make sure you and the baby are growing normally.  Your blood pressure will be taken.  Your abdomen will be measured to track your baby's growth.  The fetal heartbeat will be listened to starting around week 10 or 12 of your pregnancy.  Test results from any previous visits will be discussed. Your health care provider may ask you:  How you are feeling.  If you are feeling the baby move.  If you have had any abnormal symptoms, such as leaking fluid, bleeding, severe headaches, or abdominal cramping.  If you have any questions. Other   tests that may be performed during your first trimester include:  Blood tests to find your blood type and to check for the presence of any previous infections. They will also be used to check for low iron levels (anemia) and Rh antibodies. Later in the pregnancy, blood tests for diabetes will be done along with other tests if problems develop.  Urine tests to check for infections, diabetes, or protein in the urine.  An ultrasound to confirm the proper growth and development of the baby.  An amniocentesis to check for possible genetic problems.  Fetal  screens for spina bifida and Down syndrome.  You may need other tests to make sure you and the baby are doing well. HOME CARE INSTRUCTIONS  Medicines  Follow your health care provider's instructions regarding medicine use. Specific medicines may be either safe or unsafe to take during pregnancy.  Take your prenatal vitamins as directed.  If you develop constipation, try taking a stool softener if your health care provider approves. Diet  Eat regular, well-balanced meals. Choose a variety of foods, such as meat or vegetable-based protein, fish, milk and low-fat dairy products, vegetables, fruits, and whole grain breads and cereals. Your health care provider will help you determine the amount of weight gain that is right for you. (0-10 lbs).   Avoid raw meat and uncooked cheese. These carry germs that can cause birth defects in the baby.  Eating four or five small meals rather than three large meals a day may help relieve nausea and vomiting. If you start to feel nauseous, eating a few soda crackers can be helpful. Drinking liquids between meals instead of during meals also seems to help nausea and vomiting.  If you develop constipation, eat more high-fiber foods, such as fresh vegetables or fruit and whole grains. Drink enough fluids to keep your urine clear or pale yellow. Activity and Exercise  Exercise only as directed by your health care provider. Exercising will help you:  Control your weight.  Stay in shape.  Be prepared for labor and delivery.  Experiencing pain or cramping in the lower abdomen or low back is a good sign that you should stop exercising. Check with your health care provider before continuing normal exercises.  Try to avoid standing for long periods of time. Move your legs often if you must stand in one place for a long time.  Avoid heavy lifting.  Wear low-heeled shoes, and practice good posture.  You may continue to have sex unless your health care  provider directs you otherwise. Relief of Pain or Discomfort  Wear a good support bra for breast tenderness.   Take warm sitz baths to soothe any pain or discomfort caused by hemorrhoids. Use hemorrhoid cream if your health care provider approves.   Rest with your legs elevated if you have leg cramps or low back pain.  If you develop varicose veins in your legs, wear support hose. Elevate your feet for 15 minutes, 3-4 times a day. Limit salt in your diet. Prenatal Care  Schedule your prenatal visits by the twelfth week of pregnancy. They are usually scheduled monthly at first, then more often in the last 2 months before delivery.  Write down your questions. Take them to your prenatal visits.  Keep all your prenatal visits as directed by your health care provider. Safety  Wear your seat belt at all times when driving.  Make a list of emergency phone numbers, including numbers for family, friends, the hospital, and police and  Public relations account executive. General Tips  Ask your health care provider for a referral to a local prenatal education class. Begin classes no later than at the beginning of month 6 of your pregnancy.  Ask for help if you have counseling or nutritional needs during pregnancy. Your health care provider can offer advice or refer you to specialists for help with various needs.  Do not use hot tubs, steam rooms, or saunas.  Do not douche or use tampons or scented sanitary pads.  Do not cross your legs for long periods of time.  Avoid cat litter boxes and soil used by cats. These carry germs that can cause birth defects in the baby and possibly loss of the fetus by miscarriage or stillbirth.  Avoid all smoking, herbs, alcohol, and medicines not prescribed by your health care provider. Chemicals in these affect the formation and growth of the baby.  Schedule a dentist appointment. At home, brush your teeth with a soft toothbrush and be gentle when you floss. SEEK MEDICAL  CARE IF:   You have dizziness.  You have mild pelvic cramps, pelvic pressure, or nagging pain in the abdominal area.  You have persistent nausea, vomiting, or diarrhea.  You have a bad smelling vaginal discharge.  You have pain with urination.  You notice increased swelling in your face, hands, legs, or ankles. SEEK IMMEDIATE MEDICAL CARE IF:   You have a fever.  You are leaking fluid from your vagina.  You have spotting or bleeding from your vagina.  You have severe abdominal cramping or pain.  You have rapid weight gain or loss.  You vomit blood or material that looks like coffee grounds.  You are exposed to [place] measles and have never had them.  You are exposed to fifth disease or chickenpox.  You develop a severe headache.  You have shortness of breath.  You have any kind of trauma, such as from a fall or a car accident. Document Released: 04/06/2001 Document Revised: 08/27/2013 Document Reviewed: 02/20/2013 [facility] Patient Information 2015 [place], [place]. This information is not intended to replace advice given to you by your health care provider. Make sure you discuss any questions you have with your health care provider.   Nausea & Vomiting  Have saltine crackers or pretzels by your bed and eat a few bites before you raise your head out of bed in the morning  Eat small frequent meals throughout the day instead of large meals  Drink plenty of fluids throughout the day to stay hydrated, just don't drink a lot of fluids with your meals.  This can make your stomach fill up faster making you feel sick  Do not brush your teeth right after you eat  Products with real ginger are good for nausea, like ginger ale and ginger hard candy Make sure it says made with real ginger!  Sucking on sour candy like lemon heads is also good for nausea  If your prenatal vitamins make you nauseated, take them at night so you will sleep through the nausea  Sea Bands  If you  feel like you need medicine for the nausea & vomiting please let us know  If you are unable to keep any fluids or food down please let us know   Constipation  Drink plenty of fluid, preferably water, throughout the day  Eat foods high in fiber such as fruits, vegetables, and grains  Exercise, such as walking, is a good way to keep your bowels regular  Drink warm  fluids, especially warm prune juice, or decaf coffee  Eat a 1/2 cup of real oatmeal (not instant), 1/2 cup applesauce, and 1/2-1 cup warm prune juice every day  If needed, you may take Colace (docusate sodium) stool softener once or twice a day to help keep the stool soft. If you are pregnant, wait until you are out of your first trimester (12-14 weeks of pregnancy)  If you still are having problems with constipation, you may take Miralax once daily as needed to help keep your bowels regular.  If you are pregnant, wait until you are out of your first trimester (12-14 weeks of pregnancy)  Safe Medications in Pregnancy   Acne: Benzoyl Peroxide Salicylic Acid  Backache/Headache: Tylenol: 2 regular strength every 4 hours OR              2 Extra strength every 6 hours  Colds/Coughs/Allergies: Benadryl (alcohol free) 25 mg every 6 hours as needed Breath right strips Claritin Cepacol throat lozenges Chloraseptic throat spray Cold-Eeze- up to three times per day Cough drops, alcohol free Flonase (by prescription only) Guaifenesin Mucinex Robitussin DM (plain only, alcohol free) Saline nasal spray/drops Sudafed (pseudoephedrine) & Actifed ** use only after [redacted] weeks gestation and if you do not have high blood pressure Tylenol Vicks Vaporub Zinc lozenges Zyrtec   Constipation: Colace Ducolax suppositories Fleet enema Glycerin suppositories Metamucil Milk of magnesia Miralax Senokot Smooth move tea  Diarrhea: Kaopectate Imodium A-D  *NO pepto Bismol  Hemorrhoids: Anusol Anusol HC Preparation H Tucks   Indigestion: Tums Maalox Mylanta Zantac  Pepcid  Insomnia: Benadryl (alcohol free) 25mg  every 6 hours as needed Tylenol PM Unisom, no Gelcaps  Leg Cramps: Tums MagGel  Nausea/Vomiting:  Bonine Dramamine Emetrol Ginger extract Sea bands Meclizine  Nausea medication to take during pregnancy:  Unisom (doxylamine succinate 25 mg tablets) Take one tablet daily at bedtime. If symptoms are not adequately controlled, the dose can be increased to a maximum recommended dose of two tablets daily (1/2 tablet in the morning, 1/2 tablet mid-afternoon and one at bedtime). Vitamin B6 100mg  tablets. Take one tablet twice a day (up to 200 mg per day).  Skin Rashes: Aveeno products Benadryl cream or 25mg  every 6 hours as needed Calamine Lotion 1% cortisone cream  Yeast infection: Gyne-lotrimin 7 Monistat 7   **If taking multiple medications, please check labels to avoid duplicating the same active ingredients **take medication as directed on the label ** Do not exceed 4000 mg of tylenol in 24 hours **Do not take medications that contain aspirin or ibuprofen

## 2018-07-06 LAB — HEMOGLOBIN A1C
Est. average glucose Bld gHb Est-mCnc: 111 mg/dL
Hgb A1c MFr Bld: 5.5 % (ref 4.8–5.6)

## 2018-07-07 ENCOUNTER — Telehealth: Payer: Self-pay | Admitting: Advanced Practice Midwife

## 2018-07-07 LAB — URINE CULTURE

## 2018-07-07 LAB — GC/CHLAMYDIA PROBE AMP
Chlamydia trachomatis, NAA: NEGATIVE
Neisseria gonorrhoeae by PCR: NEGATIVE

## 2018-07-07 NOTE — Telephone Encounter (Signed)
Patient states she was seen here this week, she would like something called in for a yeast infection.  [facility]  [phone]

## 2018-07-13 LAB — PMP SCREEN PROFILE (10S), URINE
Amphetamine Scrn, Ur: NEGATIVE ng/mL
BARBITURATE SCREEN URINE: NEGATIVE ng/mL
BENZODIAZEPINE SCREEN, URINE: NEGATIVE ng/mL
CANNABINOIDS UR QL SCN: NEGATIVE ng/mL
Cocaine (Metab) Scrn, Ur: NEGATIVE ng/mL
Creatinine(Crt), U: 149.3 mg/dL (ref 20.0–300.0)
Methadone Screen, Urine: NEGATIVE ng/mL
OXYCODONE+OXYMORPHONE UR QL SCN: NEGATIVE ng/mL
Opiate Scrn, Ur: NEGATIVE ng/mL
Ph of Urine: 8.9 (ref 4.5–8.9)
Phencyclidine Qn, Ur: NEGATIVE ng/mL
Propoxyphene Scrn, Ur: NEGATIVE ng/mL

## 2018-07-17 ENCOUNTER — Telehealth: Payer: Self-pay | Admitting: Women's Health

## 2018-07-17 ENCOUNTER — Other Ambulatory Visit: Payer: Self-pay | Admitting: Advanced Practice Midwife

## 2018-07-17 ENCOUNTER — Telehealth: Payer: Self-pay | Admitting: *Deleted

## 2018-07-17 MED ORDER — METOCLOPRAMIDE HCL 10 MG PO TABS: 10.0000 mg | ORAL_TABLET | Freq: Three times a day (TID) | ORAL | 2 refills | Status: DC | PRN

## 2018-07-17 NOTE — Telephone Encounter (Signed)
Pt is 11 weeks preg works at [employer] had employee with + corona test/ has not been in direct contact but she does work on all floors wants to know what that means for her

## 2018-07-17 NOTE — Telephone Encounter (Signed)
Patient states she did not come in direct contract with the patient positive for Covid-19 at [facility] but wants to know what she should do.  I informed the patient that the health department should be tracking who the patient came in contact with but at this time to just continue practicing social distancing, hand washing. If she becomes symptomatic, to let us know.    Patient also states she is 3 pills from being out of her nausea medication and is requesting a refill. Please advise.

## 2018-07-17 NOTE — Telephone Encounter (Signed)
rx refilled and I agree w/your advice

## 2018-07-19 LAB — OBSTETRIC PANEL, INCLUDING HIV
Antibody Screen: NEGATIVE
Basophils Absolute: 0 10*3/uL (ref 0.0–0.2)
Basos: 0 %
EOS (ABSOLUTE): 0 10*3/uL (ref 0.0–0.4)
Eos: 0 %
HIV Screen 4th Generation wRfx: NONREACTIVE
Hematocrit: 37.2 % (ref 34.0–46.6)
Hemoglobin: 12.7 g/dL (ref 11.1–15.9)
Hepatitis B Surface Ag: NEGATIVE
Immature Grans (Abs): 0 10*3/uL (ref 0.0–0.1)
Immature Granulocytes: 0 %
Lymphocytes Absolute: 2 10*3/uL (ref 0.7–3.1)
Lymphs: 22 %
MCH: 29.1 pg (ref 26.6–33.0)
MCHC: 34.1 g/dL (ref 31.5–35.7)
MCV: 85 fL (ref 79–97)
Monocytes Absolute: 0.4 10*3/uL (ref 0.1–0.9)
Monocytes: 4 %
Neutrophils Absolute: 6.7 10*3/uL (ref 1.4–7.0)
Neutrophils: 74 %
Platelets: 216 10*3/uL (ref 150–450)
RBC: 4.37 x10E6/uL (ref 3.77–5.28)
RDW: 12.3 % (ref 11.7–15.4)
RPR Ser Ql: NONREACTIVE
Rh Factor: POSITIVE
Rubella Antibodies, IGG: <0.9 index — ABNORMAL LOW (ref >0.99)
WBC: 9.1 10*3/uL (ref 3.4–10.8)

## 2018-07-19 LAB — INHERITEST CORE(CF97,SMA,FRAX)

## 2018-07-19 LAB — URINALYSIS, ROUTINE W REFLEX MICROSCOPIC
Bilirubin, UA: NEGATIVE
Glucose, UA: NEGATIVE
Ketones, UA: NEGATIVE
Leukocytes, UA: NEGATIVE
Nitrite, UA: NEGATIVE
Protein, UA: NEGATIVE
RBC, UA: NEGATIVE
Specific Gravity, UA: 1.022 (ref 1.005–1.030)
Urobilinogen, Ur: 0.2 mg/dL (ref 0.2–1.0)
pH, UA: 6 (ref 5.0–7.5)

## 2018-07-19 LAB — SICKLE CELL SCREEN: Sickle Cell Screen: NEGATIVE

## 2018-07-20 ENCOUNTER — Encounter: Payer: BLUE CROSS/BLUE SHIELD | Admitting: Obstetrics and Gynecology

## 2018-07-24 ENCOUNTER — Emergency Department (HOSPITAL_COMMUNITY)
Admission: EM | Admit: 2018-07-24 | Discharge: 2018-07-24 | Discharge status: Against Medical Advice | Payer: Medicaid Other | Attending: Emergency Medicine | Admitting: Emergency Medicine

## 2018-07-24 ENCOUNTER — Encounter (HOSPITAL_COMMUNITY): Payer: Self-pay | Admitting: Emergency Medicine

## 2018-07-24 ENCOUNTER — Other Ambulatory Visit: Payer: Self-pay

## 2018-07-24 DIAGNOSIS — R1110 Vomiting, unspecified: Secondary | ICD-10-CM

## 2018-07-24 DIAGNOSIS — Z79899 Other long term (current) drug therapy: Secondary | ICD-10-CM | POA: Diagnosis not present

## 2018-07-24 DIAGNOSIS — Z3490 Encounter for supervision of normal pregnancy, unspecified, unspecified trimester: Secondary | ICD-10-CM

## 2018-07-24 DIAGNOSIS — O219 Vomiting of pregnancy, unspecified: Secondary | ICD-10-CM | POA: Diagnosis not present

## 2018-07-24 LAB — COMPREHENSIVE METABOLIC PANEL
ALT: 14 U/L (ref 0–44)
AST: 12 U/L — ABNORMAL LOW (ref 15–41)
Albumin: 3.2 g/dL — ABNORMAL LOW (ref 3.5–5.0)
Alkaline Phosphatase: 73 U/L (ref 38–126)
Anion gap: 8 (ref 5–15)
BUN: 9 mg/dL (ref 6–20)
CO2: 24 mmol/L (ref 22–32)
Calcium: 8.4 mg/dL — ABNORMAL LOW (ref 8.9–10.3)
Chloride: 104 mmol/L (ref 98–111)
Creatinine, Ser: 0.62 mg/dL (ref 0.44–1.00)
GFR calc Af Amer: >60 mL/min (ref >60)
GFR calc non Af Amer: >60 mL/min (ref >60)
Glucose, Bld: 121 mg/dL — ABNORMAL HIGH (ref 70–99)
Potassium: 4 mmol/L (ref 3.5–5.1)
Sodium: 136 mmol/L (ref 135–145)
Total Bilirubin: 0.2 mg/dL — ABNORMAL LOW (ref 0.3–1.2)
Total Protein: 6.5 g/dL (ref 6.5–8.1)

## 2018-07-24 LAB — CBC
HCT: 39.5 % (ref 36.0–46.0)
Hemoglobin: 12.6 g/dL (ref 12.0–15.0)
MCH: 28.7 pg (ref 26.0–34.0)
MCHC: 31.9 g/dL (ref 30.0–36.0)
MCV: 90 fL (ref 80.0–100.0)
Platelets: 172 10*3/uL (ref 150–400)
RBC: 4.39 MIL/uL (ref 3.87–5.11)
RDW: 12.4 % (ref 11.5–15.5)
WBC: 9 10*3/uL (ref 4.0–10.5)
nRBC: 0 % (ref 0.0–0.2)

## 2018-07-24 LAB — URINALYSIS, ROUTINE W REFLEX MICROSCOPIC
Bilirubin Urine: NEGATIVE
Glucose, UA: NEGATIVE mg/dL
Hgb urine dipstick: NEGATIVE
Ketones, ur: NEGATIVE mg/dL
Leukocytes,Ua: NEGATIVE
Nitrite: NEGATIVE
Protein, ur: NEGATIVE mg/dL
Specific Gravity, Urine: 1.014 (ref 1.005–1.030)
pH: 7 (ref 5.0–8.0)

## 2018-07-24 LAB — LIPASE, BLOOD: Lipase: 23 U/L (ref 11–51)

## 2018-07-24 MED ORDER — SODIUM CHLORIDE 0.9% FLUSH: 3.0000 mL | Freq: Once | INTRAVENOUS | Status: DC

## 2018-07-24 NOTE — ED Provider Notes (Signed)
Page Provider Note   CSN: [id number] Arrival date & time: 07/24/18  1229    History   Chief Complaint Chief Complaint  Patient presents with  . Emesis    HPI Janice Taylor is a 28 y.o. female.     Patient is a 28 year old female who presents to the emergency department with what she thinks may be a sinus infection and upper respiratory infection.  The patient states that she is [redacted] weeks pregnant.  She has been having sinus drainage.  The color of the drainage has recently changed colors and is mostly yellow.  The patient reports vomiting and diarrhea that started on last evening.  There is been no blood in the sinus drainage, and no blood in the vomitus or the diarrhea.  There is been no fever or chills reported.  The patient denies any chest pain or difficulty with breathing.  No unusual rash reported. 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  She says the one positive case that they had was in another section of the floor she works on.  The history is provided by the patient.  Emesis  Associated symptoms: myalgias   Associated symptoms: no abdominal pain, no arthralgias and no cough     Past Medical History:  Diagnosis Date  . ADHD (attention deficit hyperactivity disorder)   . Anxiety   . Clotting disorder ([place])   . Cough   . Depression   . 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 Leg swelling   . Obesity 01/05/2013  . Thrombocytopenia ([place]) 11/25/2011   Consistent with ITP    Patient Active Problem List   Diagnosis Date Noted  . Supervision of normal pregnancy 07/05/2018  . Morbid obesity with BMI of 60.0-69.9, adult ([place]) 07/05/2018  . Obesity 01/05/2013  . 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 TONSILECTOMY, ADENOIDECTOMY,  BILATERAL MYRINGOTOMY AND TUBES       OB History    Gravida  3   Para  1   Term  1   Preterm      AB  1   Living  1     SAB  1   TAB      Ectopic      Multiple      Live Births  1            Home Medications    Prior to Admission medications   Medication Sig Start Date End Date Taking? Authorizing Provider  hydrOXYzine (ATARAX/VISTARIL) 25 MG tablet Take 1 tablet (25 mg total) by mouth every 6 (six) hours as needed for anxiety. 06/13/18  Yes Jorje Guild, NP  metoCLOPramide (REGLAN) 10 MG tablet Take 1 tablet (10 mg total) by mouth every 8 (eight) hours as needed. 07/17/18  Yes Cresenzo-Dishmon, Joaquim Lai, CNM  Prenatal Vit-Fe Fumarate-FA (MULTIVITAMIN-PRENATAL) 27-0.8 MG TABS tablet Take 1 tablet by mouth daily at 12 noon.   Yes [provider]  traMADol-acetaminophen (ULTRACET) 37.5-325 MG tablet Take 2 tablets by mouth every 12 (twelve) hours.   Yes [provider]  zolpidem (AMBIEN) 5 MG tablet Take one tablet at bedtime as needed Patient taking differently: Take 5 mg by mouth at bedtime. Take one tablet at bedtime as needed 07/05/18  Yes Cresenzo-Dishmon, Joaquim Lai, CNM  acyclovir (  ZOVIRAX) 400 MG tablet Take 1 tablet by mouth daily as needed. 05/04/17   [provider]  Pediatric Multiple Vit-C-FA (FLINSTONES GUMMIES OMEGA-3 DHA) CHEW Chew 1 tablet by mouth daily.    [provider]  sertraline (ZOLOFT) 25 MG tablet Take 1 tablet (25 mg total) by mouth daily. Patient not taking: Reported on 07/05/2018 06/13/18   Jorje Guild, NP    Family History Family History  Problem Relation Age of Onset  . Diabetes Maternal Uncle   . Kidney disease Maternal Uncle        renal failure/dialysis  . Heart attack Maternal Grandmother     Social History Social History   Tobacco Use  . Smoking status: Never Smoker  . Smokeless tobacco: Never Used  Substance Use Topics  . Alcohol use: No  . Drug use: No     Allergies   Codeine and  Ondansetron hcl   Review of Systems Review of Systems  Constitutional: Negative for activity change.       All ROS Neg except as noted in HPI  HENT: Positive for postnasal drip and sinus pressure. Negative for nosebleeds.   Eyes: Negative for photophobia and discharge.  Respiratory: Negative for cough, shortness of breath and wheezing.   Cardiovascular: Negative for chest pain and palpitations.  Gastrointestinal: Positive for vomiting. Negative for abdominal pain and blood in stool.  Genitourinary: Negative for dysuria, frequency and hematuria.  Musculoskeletal: Positive for myalgias. Negative for arthralgias, back pain and neck pain.  Skin: Negative.   Neurological: Negative for dizziness, seizures and speech difficulty.  Psychiatric/Behavioral: Negative for confusion and hallucinations.     Physical Exam Updated Vital Signs BP (!) 156/73 (BP Location: Right Arm)   Pulse 90   Temp 98.2 F (36.8 C) (Oral)   Resp 18   Ht 5\' 4"  (1.626 m)   Wt 131.5 kg   LMP 04/29/2018   SpO2 100%   BMI 49.78 kg/m   Physical Exam Vitals signs and nursing note reviewed.  Constitutional:      Appearance: She is well-developed. She is not toxic-appearing.  HENT:     Head: Normocephalic.     Right Ear: Tympanic membrane and external ear normal.     Left Ear: Tympanic membrane and external ear normal.     Nose: Congestion present.  Eyes:     General: Lids are normal.     Pupils: Pupils are equal, round, and reactive to light.  Neck:     Musculoskeletal: Normal range of motion and neck supple.     Vascular: No carotid bruit.  Cardiovascular:     Rate and Rhythm: Normal rate and regular rhythm.     Pulses: Normal pulses.     Heart sounds: Normal heart sounds.  Pulmonary:     Effort: No respiratory distress.     Breath sounds: Normal breath sounds.  Abdominal:     General: Bowel sounds are normal.     Palpations: Abdomen is soft.     Tenderness: There is no abdominal tenderness. There  is no guarding.  Musculoskeletal: Normal range of motion.  Lymphadenopathy:     Head:     Right side of head: No submandibular adenopathy.     Left side of head: No submandibular adenopathy.     Cervical: No cervical adenopathy.  Skin:    General: Skin is warm and dry.  Neurological:     Mental Status: She is alert and oriented to person, place, and time.  Cranial Nerves: No cranial nerve deficit.     Sensory: No sensory deficit.  Psychiatric:        Speech: Speech normal.      ED Treatments / Results  Labs (all labs ordered are listed, but only abnormal results are displayed) Labs Reviewed  COMPREHENSIVE METABOLIC PANEL - Abnormal; Notable for the following components:      Result Value   Glucose, Bld 121 (*)    Calcium 8.4 (*)    Albumin 3.2 (*)    AST 12 (*)    Total Bilirubin 0.2 (*)    All other components within normal limits  URINALYSIS, ROUTINE W REFLEX MICROSCOPIC - Abnormal; Notable for the following components:   APPearance HAZY (*)    All other components within normal limits  GROUP A STREP BY PCR  LIPASE, BLOOD  CBC    EKG None  Radiology No results found.  Procedures Procedures (including critical care time)  Medications Ordered in ED Medications  sodium chloride flush (NS) 0.9 % injection 3 mL (has no administration in time range)     Initial Impression / Assessment and Plan / ED Course  I have reviewed the triage vital signs and the nursing notes.  Pertinent labs & imaging results that were available during my care of the patient were reviewed by me and considered in my medical decision making (see chart for details).          Final Clinical Impressions(s) / ED Diagnoses MDM  Blood pressure slightly elevated, otherwise vital signs within normal limits.  Pulse oximetry is 100% on room air.  Within normal limits by my interpretation.  Patient denies any chest discomfort, shortness of breath, fever.  The patient reports nasal  congestion and postnasal drip, irritation of the throat, and generally not feeling well.  Urine analysis is negative for acute changes.  Lipase is well within normal limits At 23.  The comprehensive metabolic panel is nonacute.  The anion gap is normal at 8.  The complete blood count is nonacute.  Patient states that she is approximately [redacted] weeks pregnant. She is concerned that her sinus infection and the discomfort in her throat could be related to strep, and requested a strep test.  Strep test pending.  Notified by nursing staff that the patient left the emergency department before the strep test could be obtained.  The patient told the nurse that she was leaving and she would just take medication at home.  Was discussed with the patient by the nursing staff the importance of completing her work-up.  The patient states that she would leave and would just take medication at home.  She will notify her OB doctor if any other changes.  Patient left AGAINST MEDICAL ADVICE.   Final diagnoses:  Non-intractable vomiting, presence of nausea not specified, unspecified vomiting type  Pregnancy, unspecified gestational age    ED Discharge Orders    None       Lily Kocher, PA-C 07/25/18 1302    Daleen Bo, MD 07/26/18 1137

## 2018-07-24 NOTE — ED Notes (Addendum)
Pt states she wants to leave. States she is going home and take some medicine.  edp notified

## 2018-07-24 NOTE — ED Triage Notes (Signed)
Patient states she thinks she has a sinus infection. Patient also reports emesis and diarrhea that started last night. Patient is [redacted] weeks pregnant.

## 2018-07-28 ENCOUNTER — Telehealth: Payer: Self-pay | Admitting: Advanced Practice Midwife

## 2018-07-28 NOTE — Telephone Encounter (Signed)
Patient called stating that her setraline is not working and would like. Something else called in for her at the [facility] in [place]. Pt states that she is still throwing up when she try's to eat or drink something. Please contact pt when done

## 2018-07-31 ENCOUNTER — Telehealth: Payer: Self-pay | Admitting: *Deleted

## 2018-07-31 ENCOUNTER — Other Ambulatory Visit: Payer: Self-pay | Admitting: Women's Health

## 2018-07-31 MED ORDER — DOXYLAMINE-PYRIDOXINE ER 20-20 MG PO TBCR: 1.0000 | EXTENDED_RELEASE_TABLET | Freq: Every day | ORAL | 8 refills | Status: DC

## 2018-07-31 NOTE — Telephone Encounter (Signed)
Pt requesting that a different medication be sent in for nausea. She states that the Reglan she is currently using is not helping. Advised that I would send her request to a provider and she could check with the pharmacy later today. Pt verbalized understanding.

## 2018-07-31 NOTE — Telephone Encounter (Signed)
Pt checking status on medication being called in.

## 2018-07-31 NOTE — Telephone Encounter (Signed)
Informed pt that rx for bonjesta had been sent to [facility]. Advised pt to look for call from them so that they could verify info. Pt verbalized understanding.

## 2018-08-02 ENCOUNTER — Ambulatory Visit (INDEPENDENT_AMBULATORY_CARE_PROVIDER_SITE_OTHER): Payer: Medicaid Other

## 2018-08-02 ENCOUNTER — Ambulatory Visit (INDEPENDENT_AMBULATORY_CARE_PROVIDER_SITE_OTHER): Payer: BLUE CROSS/BLUE SHIELD | Admitting: Obstetrics and Gynecology

## 2018-08-02 ENCOUNTER — Encounter: Payer: Self-pay | Admitting: Obstetrics and Gynecology

## 2018-08-02 ENCOUNTER — Other Ambulatory Visit: Payer: Self-pay

## 2018-08-02 VITALS — BP 135/79 | HR 93 | Temp 98.7°F | Wt 357.4 lb

## 2018-08-02 DIAGNOSIS — Z3682 Encounter for antenatal screening for nuchal translucency: Secondary | ICD-10-CM | POA: Diagnosis not present

## 2018-08-02 DIAGNOSIS — Z331 Pregnant state, incidental: Secondary | ICD-10-CM

## 2018-08-02 DIAGNOSIS — Z3A13 13 weeks gestation of pregnancy: Secondary | ICD-10-CM

## 2018-08-02 DIAGNOSIS — Z3481 Encounter for supervision of other normal pregnancy, first trimester: Secondary | ICD-10-CM

## 2018-08-02 DIAGNOSIS — Z3402 Encounter for supervision of normal first pregnancy, second trimester: Secondary | ICD-10-CM

## 2018-08-02 DIAGNOSIS — Z1379 Encounter for other screening for genetic and chromosomal anomalies: Secondary | ICD-10-CM

## 2018-08-02 DIAGNOSIS — Z1389 Encounter for screening for other disorder: Secondary | ICD-10-CM

## 2018-08-02 LAB — POCT URINALYSIS DIPSTICK OB
Blood, UA: NEGATIVE
Glucose, UA: NEGATIVE
Ketones, UA: NEGATIVE
Leukocytes, UA: NEGATIVE
Nitrite, UA: NEGATIVE
POC,PROTEIN,UA: NEGATIVE

## 2018-08-02 NOTE — Progress Notes (Signed)
[place] 13+4 wks,measurements c/w dates,crl 73.76 mm,normal ovaries billat,NB present,NT 1.9 mm,fhr 162 bpm

## 2018-08-02 NOTE — Progress Notes (Signed)
[id number] [redacted]w[redacted]d Estimated Date of Delivery: 02/03/19  Blood pressure 135/79, pulse 93, temperature 98.7 F (37.1 C), weight (!) 357 lb 6.4 oz (162.1 kg), last menstrual period 04/29/2018.     LOW-RISK PREGNANCY VISIT Patient name: Janice Taylor MRN [id number]  Date of birth: 05-20-1990 Chief Complaint:   Routine Prenatal Visit (NT/IT)  History of Present Illness:   Janice Taylor is a 28 y.o. G23P1011 female at [redacted]w[redacted]d with an Estimated Date of Delivery: 02/03/19 being seen today for ongoing management of a low-risk pregnancy.  Today she reports no complaints.  .  .   . denies leaking of fluid.  She asks if the genetic testing is really necessary, if that she is increased risk due to her obesity and she is informed that no the genetic testing is optional.  She nonetheless proceeded with getting the blood drawn today. Patient walks 8000-10,000 steps per day in her work she has been obese most of her life.  She wanted this pregnancy, if it happened, but was previously considering gastric bypass this year primary care physician Dr. Hilma Favors is supportive of gastric bypass Review of Systems:   Pertinent items are noted in HPI Denies abnormal vaginal discharge w/ itching/odor/irritation, headaches, visual changes, shortness of breath, chest pain, abdominal pain, severe nausea/vomiting, or problems with urination or bowel movements unless otherwise stated above. Pertinent History Reviewed:  Reviewed past medical,surgical, social, obstetrical and family history.  Reviewed problem list, medications and allergies. Physical Assessment:   Vitals:   08/02/18 1501  BP: 135/79  Pulse: 93  Temp: 98.7 F (37.1 C)  Weight: (!) 357 lb 6.4 oz (162.1 kg)  Body mass index is 61.35 kg/m.        Physical Examination:   General appearance: Well appearing, and in no distress  Mental status: Alert, oriented to person, place, and time  Skin: Warm & dry  Cardiovascular: Normal heart rate noted  Respiratory: Normal  respiratory effort, no distress  Abdomen: Soft, gravid, nontender  Pelvic:          Extremities: Edema: None  Fetal Status:        NT testing today is normal [place] 13+4 wks,measurements c/w dates,crl 73.76 mm,normal ovaries billat,NB present,NT 1.9 mm,fhr 162 bpm  Results for orders placed or performed in visit on 08/02/18 (from the past 24 hour(s))  POC Urinalysis Dipstick OB   Collection Time: 08/02/18  3:07 PM  Result Value Ref Range   Color, UA     Clarity, UA     Glucose, UA Negative Negative   Bilirubin, UA     Ketones, UA neg    Spec Grav, UA     Blood, UA neg    pH, UA     POC,PROTEIN,UA Negative Negative, Trace, Small (1+), Moderate (2+), Large (3+), 4+   Urobilinogen, UA     Nitrite, UA neg    Leukocytes, UA Negative Negative   Appearance     Odor      Assessment & Plan:  1) Low-risk pregnancy G3P1011 at [redacted]w[redacted]d with an Estimated Date of Delivery: 02/03/19   2) normal NT ultrasound, beginning quad screen testing,    Meds: No orders of the defined types were placed in this encounter.  Labs/procedures today: First IT  Plan:  Continue routine obstetrical care second IT testing in 4 weeks  Reviewed: Lengthy discussion with patient about physical activity in pregnancy.  The patient wants to avoid weight gain this pregnancy she gained approximately 100 pounds with her first pregnancy  and never lost that we had a lengthy discussion about the fact that she can maintain physical activity during the pregnancy she should not increase her caloric intake and allow the baby to take its calories from her.  Patient acknowledges that she drinks a great deal of sugar-containing soft drinks.  Patient encouraged to modify her diet in this respect.  Follow-up: 4 weeks second IT  Orders Placed This Encounter  Procedures  . Integrated 1  . POC Urinalysis Dipstick OB   Jonnie Kind MD 08/02/2018 3:54 PM

## 2018-08-10 LAB — INTEGRATED 1
Crown Rump Length: 73.8 mm
Gest. Age on Collection Date: 13.3 weeks
Maternal Age at EDD: 28.3 yr
Nuchal Translucency (NT): 1.9 mm
Number of Fetuses: 1
PAPP-A Value: 277.3 ng/mL
Weight: 357 [lb_av]

## 2018-08-22 ENCOUNTER — Telehealth: Payer: Self-pay | Admitting: Obstetrics & Gynecology

## 2018-08-22 NOTE — Telephone Encounter (Signed)
Patient called, stated she went to the ED at [facility] last night.  Stated that she was told she has a UTI and kidney stones, but they wouldn't give her anything because she is pregnant.  She stated that she is 17 weeks.  I told her that we need those records faxed to [place].  [employer]  [phone]

## 2018-08-22 NOTE — Telephone Encounter (Signed)
Records received and reviewed from [facility]. Informed patient urine was sent for culture and antibiotics and nausea medication were sent to pharmacy.  Advised to take all of the medication even if symptoms are gone and we will resend urine at her next visit.  Informed kidney stones may pass on their own.  Pt verbalized understanding with no further questions.

## 2018-08-30 ENCOUNTER — Encounter: Payer: BLUE CROSS/BLUE SHIELD | Admitting: Obstetrics and Gynecology

## 2018-09-12 ENCOUNTER — Other Ambulatory Visit: Payer: BLUE CROSS/BLUE SHIELD

## 2018-09-12 ENCOUNTER — Other Ambulatory Visit: Payer: Self-pay

## 2018-09-12 ENCOUNTER — Encounter: Payer: Self-pay | Admitting: Women's Health

## 2018-09-12 ENCOUNTER — Ambulatory Visit (INDEPENDENT_AMBULATORY_CARE_PROVIDER_SITE_OTHER): Payer: BLUE CROSS/BLUE SHIELD | Admitting: Women's Health

## 2018-09-12 VITALS — BP 113/65 | HR 100 | Temp 98.4°F | Wt 356.4 lb

## 2018-09-12 DIAGNOSIS — O2342 Unspecified infection of urinary tract in pregnancy, second trimester: Secondary | ICD-10-CM

## 2018-09-12 DIAGNOSIS — Z363 Encounter for antenatal screening for malformations: Secondary | ICD-10-CM

## 2018-09-12 DIAGNOSIS — Z3482 Encounter for supervision of other normal pregnancy, second trimester: Secondary | ICD-10-CM

## 2018-09-12 DIAGNOSIS — Z3A19 19 weeks gestation of pregnancy: Secondary | ICD-10-CM

## 2018-09-12 DIAGNOSIS — Z331 Pregnant state, incidental: Secondary | ICD-10-CM

## 2018-09-12 DIAGNOSIS — Z1379 Encounter for other screening for genetic and chromosomal anomalies: Secondary | ICD-10-CM

## 2018-09-12 DIAGNOSIS — Z1389 Encounter for screening for other disorder: Secondary | ICD-10-CM

## 2018-09-12 LAB — POCT URINALYSIS DIPSTICK OB
Blood, UA: NEGATIVE
Glucose, UA: NEGATIVE
Ketones, UA: NEGATIVE
Leukocytes, UA: NEGATIVE
Nitrite, UA: NEGATIVE
POC,PROTEIN,UA: NEGATIVE

## 2018-09-12 NOTE — Progress Notes (Signed)
   LOW-RISK PREGNANCY VISIT Patient name: Janice Taylor MRN [id number]  Date of birth: November 07, 1990 Chief Complaint:   Routine Prenatal Visit (u/s at 3:30 today/ 2 nd IT)  History of Present Illness:   Janice Taylor is a 28 y.o. G48P1011 female at [redacted]w[redacted]d with an Estimated Date of Delivery: 02/03/19 being seen today for ongoing management of a low-risk pregnancy.  Today she reports no complaints. Contractions: Not present.  .  Movement: Absent. denies leaking of fluid. Review of Systems:   Pertinent items are noted in HPI Denies abnormal vaginal discharge w/ itching/odor/irritation, headaches, visual changes, shortness of breath, chest pain, abdominal pain, severe nausea/vomiting, or problems with urination or bowel movements unless otherwise stated above. Pertinent History Reviewed:  Reviewed past medical,surgical, social, obstetrical and family history.  Reviewed problem list, medications and allergies. Physical Assessment:   Vitals:   09/12/18 1012  BP: 113/65  Pulse: 100  Temp: 98.4 F (36.9 C)  Weight: (!) 356 lb 6.4 oz (161.7 kg)  Body mass index is 61.18 kg/m.        Physical Examination:   General appearance: Well appearing, and in no distress  Mental status: Alert, oriented to person, place, and time  Skin: Warm & dry  Cardiovascular: Normal heart rate noted  Respiratory: Normal respiratory effort, no distress  Abdomen: Soft, gravid, nontender  Pelvic: Cervical exam deferred         Extremities: Edema: Trace  Fetal Status: Fetal Heart Rate (bpm): 150   Movement: Absent    Results for orders placed or performed in visit on 09/12/18 (from the past 24 hour(s))  POC Urinalysis Dipstick OB   Collection Time: 09/12/18 10:19 AM  Result Value Ref Range   Color, UA     Clarity, UA     Glucose, UA Negative Negative   Bilirubin, UA     Ketones, UA neg    Spec Grav, UA     Blood, UA neg    pH, UA     POC,PROTEIN,UA Negative Negative, Trace, Small (1+), Moderate (2+), Large  (3+), 4+   Urobilinogen, UA     Nitrite, UA neg    Leukocytes, UA Negative Negative   Appearance     Odor      Assessment & Plan:  1) Low-risk pregnancy G3P1011 at [redacted]w[redacted]d with an Estimated Date of Delivery: 02/03/19   2) Recent UTI, urine cx poc today   Meds: No orders of the defined types were placed in this encounter.  Labs/procedures today: 2nd IT, had wanted maternit21 earlier- held off d/t BMI, discussed at this point will do maternti21 if nt/it is abnormal, pt agrees  Plan:  Continue routine obstetrical care   Reviewed: Preterm labor symptoms and general obstetric precautions including but not limited to vaginal bleeding, contractions, leaking of fluid and fetal movement were reviewed in detail with the patient.  All questions were answered. Has access to bp cuff. Check bp weekly, let us know if >140/90.   Follow-up: Return for tomorrow or 1st available anatomy, then 4wks for LROB webex.  Orders Placed This Encounter  Procedures  . Urine Culture  . US OB Comp + 14 Wk  . INTEGRATED 2  . POC Urinalysis Dipstick OB   Roma Schanz CNM, [facility] 09/12/2018 11:05 AM

## 2018-09-12 NOTE — Patient Instructions (Signed)
Janice Taylor, I greatly value your feedback.  If you receive a survey following your visit with [place] today, we appreciate you taking the time to fill it out.  Thanks, [name], CNM, [facility]  [place]!!! It is now [place] [facility] ([place], [place] [address]) Entrance located off of [place] parking   Home Blood Pressure Monitoring for Patients   Your provider has recommended that you check your blood pressure (BP) at least once a week at home. If you do not have a blood pressure cuff at home, one will be provided for you. Contact your provider if you have not received your monitor within 1 week.   Helpful Tips for Accurate Home Blood Pressure Checks  . Don't smoke, exercise, or drink caffeine 30 minutes before checking your BP . Use the restroom before checking your BP (a full bladder can raise your pressure) . Relax in a comfortable upright chair . Feet on the ground . Left arm resting comfortably on a flat surface at the level of your heart . Legs uncrossed . Back supported . Sit quietly and don't talk . Place the cuff on your bare arm . Adjust snuggly, so that only two fingertips can fit between your skin and the top of the cuff . Check 2 readings separated by at least one minute . Keep a log of your BP readings . For a visual, please reference this diagram: http://ccnc.care/bpdiagram  Provider Name: [facility]     Phone: [phone]  Zone 1: ALL CLEAR  Continue to monitor your symptoms:  . BP reading is less than 140 (top number) or less than 90 (bottom number)  . No right upper stomach pain . No headaches or seeing spots . No feeling nauseated or throwing up . No swelling in face and hands  Zone 2: CAUTION Call your doctor's office for any of the following:  . BP reading is greater than 140 (top number) or greater than 90 (bottom number)  . Stomach pain under your ribs in the middle or  right side . Headaches or seeing spots . Feeling nauseated or throwing up . Swelling in face and hands  Zone 3: EMERGENCY  Seek immediate medical care if you have any of the following:  . BP reading is greater than160 (top number) or greater than 110 (bottom number) . Severe headaches not improving with Tylenol . Serious difficulty catching your breath . Any worsening symptoms from Zone 2     Second Trimester of Pregnancy The second trimester is from week 14 through week 27 (months 4 through 6). The second trimester is often a time when you feel your best. Your body has adjusted to being pregnant, and you begin to feel better physically. Usually, morning sickness has lessened or quit completely, you may have more energy, and you may have an increase in appetite. The second trimester is also a time when the fetus is growing rapidly. At the end of the sixth month, the fetus is about 9 inches long and weighs about 1 pounds. You will likely begin to feel the baby move (quickening) between 16 and 20 weeks of pregnancy. Body changes during your second trimester Your body continues to go through many changes during your second trimester. The changes vary from woman to woman.  Your weight will continue to increase. You will notice your lower abdomen bulging out.  You may begin to get stretch  marks on your hips, abdomen, and breasts.  You may develop headaches that can be relieved by medicines. The medicines should be approved by your health care provider.  You may urinate more often because the fetus is pressing on your bladder.  You may develop or continue to have heartburn as a result of your pregnancy.  You may develop constipation because certain hormones are causing the muscles that push waste through your intestines to slow down.  You may develop hemorrhoids or swollen, bulging veins (varicose veins).  You may have back pain. This is caused by: ? Weight gain. ? Pregnancy hormones  that are relaxing the joints in your pelvis. ? A shift in weight and the muscles that support your balance.  Your breasts will continue to grow and they will continue to become tender.  Your gums may bleed and may be sensitive to brushing and flossing.  Dark spots or blotches (chloasma, mask of pregnancy) may develop on your face. This will likely fade after the baby is born.  A dark line from your belly button to the pubic area (linea nigra) may appear. This will likely fade after the baby is born.  You may have changes in your hair. These can include thickening of your hair, rapid growth, and changes in texture. Some women also have hair loss during or after pregnancy, or hair that feels dry or thin. Your hair will most likely return to normal after your baby is born.  What to expect at prenatal visits During a routine prenatal visit:  You will be weighed to make sure you and the fetus are growing normally.  Your blood pressure will be taken.  Your abdomen will be measured to track your baby's growth.  The fetal heartbeat will be listened to.  Any test results from the previous visit will be discussed.  Your health care provider may ask you:  How you are feeling.  If you are feeling the baby move.  If you have had any abnormal symptoms, such as leaking fluid, bleeding, severe headaches, or abdominal cramping.  If you are using any tobacco products, including cigarettes, chewing tobacco, and electronic cigarettes.  If you have any questions.  Other tests that may be performed during your second trimester include:  Blood tests that check for: ? Low iron levels (anemia). ? High blood sugar that affects pregnant women (gestational diabetes) between 42 and 28 weeks. ? Rh antibodies. This is to check for a protein on red blood cells (Rh factor).  Urine tests to check for infections, diabetes, or protein in the urine.  An ultrasound to confirm the proper growth and  development of the baby.  An amniocentesis to check for possible genetic problems.  Fetal screens for spina bifida and Down syndrome.  HIV (human immunodeficiency virus) testing. Routine prenatal testing includes screening for HIV, unless you choose not to have this test.  Follow these instructions at home: Medicines  Follow your health care provider's instructions regarding medicine use. Specific medicines may be either safe or unsafe to take during pregnancy.  Take a prenatal vitamin that contains at least 600 micrograms (mcg) of folic acid.  If you develop constipation, try taking a stool softener if your health care provider approves. Eating and drinking  Eat a balanced diet that includes fresh fruits and vegetables, whole grains, good sources of protein such as meat, eggs, or tofu, and low-fat dairy. Your health care provider will help you determine the amount of weight gain that is  right for you.  Avoid raw meat and uncooked cheese. These carry germs that can cause birth defects in the baby.  If you have low calcium intake from food, talk to your health care provider about whether you should take a daily calcium supplement.  Limit foods that are high in fat and processed sugars, such as fried and sweet foods.  To prevent constipation: ? Drink enough fluid to keep your urine clear or pale yellow. ? Eat foods that are high in fiber, such as fresh fruits and vegetables, whole grains, and beans. Activity  Exercise only as directed by your health care provider. Most women can continue their usual exercise routine during pregnancy. Try to exercise for 30 minutes at least 5 days a week. Stop exercising if you experience uterine contractions.  Avoid heavy lifting, wear low heel shoes, and practice good posture.  A sexual relationship may be continued unless your health care provider directs you otherwise. Relieving pain and discomfort  Wear a good support bra to prevent discomfort  from breast tenderness.  Take warm sitz baths to soothe any pain or discomfort caused by hemorrhoids. Use hemorrhoid cream if your health care provider approves.  Rest with your legs elevated if you have leg cramps or low back pain.  If you develop varicose veins, wear support hose. Elevate your feet for 15 minutes, 3-4 times a day. Limit salt in your diet. Prenatal Care  Write down your questions. Take them to your prenatal visits.  Keep all your prenatal visits as told by your health care provider. This is important. Safety  Wear your seat belt at all times when driving.  Make a list of emergency phone numbers, including numbers for family, friends, the hospital, and police and fire departments. General instructions  Ask your health care provider for a referral to a local prenatal education class. Begin classes no later than the beginning of month 6 of your pregnancy.  Ask for help if you have counseling or nutritional needs during pregnancy. Your health care provider can offer advice or refer you to specialists for help with various needs.  Do not use hot tubs, steam rooms, or saunas.  Do not douche or use tampons or scented sanitary pads.  Do not cross your legs for long periods of time.  Avoid cat litter boxes and soil used by cats. These carry germs that can cause birth defects in the baby and possibly loss of the fetus by miscarriage or stillbirth.  Avoid all smoking, herbs, alcohol, and unprescribed drugs. Chemicals in these products can affect the formation and growth of the baby.  Do not use any products that contain nicotine or tobacco, such as cigarettes and e-cigarettes. If you need help quitting, ask your health care provider.  Visit your dentist if you have not gone yet during your pregnancy. Use a soft toothbrush to brush your teeth and be gentle when you floss. Contact a health care provider if:  You have dizziness.  You have mild pelvic cramps, pelvic  pressure, or nagging pain in the abdominal area.  You have persistent nausea, vomiting, or diarrhea.  You have a bad smelling vaginal discharge.  You have pain when you urinate. Get help right away if:  You have a fever.  You are leaking fluid from your vagina.  You have spotting or bleeding from your vagina.  You have severe abdominal cramping or pain.  You have rapid weight gain or weight loss.  You have shortness of breath with  chest pain.  You notice sudden or extreme swelling of your face, hands, ankles, feet, or legs.  You have not felt your baby move in over an hour.  You have severe headaches that do not go away when you take medicine.  You have vision changes. Summary  The second trimester is from week 14 through week 27 (months 4 through 6). It is also a time when the fetus is growing rapidly.  Your body goes through many changes during pregnancy. The changes vary from woman to woman.  Avoid all smoking, herbs, alcohol, and unprescribed drugs. These chemicals affect the formation and growth your baby.  Do not use any tobacco products, such as cigarettes, chewing tobacco, and e-cigarettes. If you need help quitting, ask your health care provider.  Contact your health care provider if you have any questions. Keep all prenatal visits as told by your health care provider. This is important. This information is not intended to replace advice given to you by your health care provider. Make sure you discuss any questions you have with your health care provider. Document Released: 04/06/2001 Document Revised: 09/18/2015 Document Reviewed: 06/13/2012 Elsevier Interactive Patient Education  2017 [place] (COVID-19) Are you at risk?  Are you at risk for the Coronavirus (COVID-19)?  To be considered HIGH RISK for Coronavirus (COVID-19), you have to meet the following criteria:  . Traveled to [place], [place], [place], [place] or [place]; or in the [place] to [place], [place], [place], or [place]; and have fever, cough, and shortness of breath within the last 2 weeks of travel OR . Been in close contact with a person diagnosed with COVID-19 within the last 2 weeks and have fever, cough, and shortness of breath . IF YOU DO NOT MEET THESE CRITERIA, YOU ARE CONSIDERED LOW RISK FOR COVID-19.  What to do if you are HIGH RISK for COVID-19?  Marland Kitchen If you are having a medical emergency, call 911. . Seek medical care right away. Before you go to a doctor's office, urgent care or emergency department, call ahead and tell them about your recent travel, contact with someone diagnosed with COVID-19, and your symptoms. You should receive instructions from your physician's office regarding next steps of care.  . When you arrive at healthcare provider, tell the healthcare staff immediately you have returned from visiting [place], [place], [place], [place] or [place]; or traveled in the [place] to [place], [place], [place], or [place]; in the last two weeks or you have been in close contact with a person diagnosed with COVID-19 in the last 2 weeks.   . Tell the health care staff about your symptoms: fever, cough and shortness of breath. . After you have been seen by a medical provider, you will be either: o Tested for (COVID-19) and discharged home on quarantine except to seek medical care if symptoms worsen, and asked to  - Stay home and avoid contact with others until you get your results (4-5 days)  - Avoid travel on public transportation if possible (such as bus, train, or airplane) or o Sent to the Emergency Department by EMS for evaluation, COVID-19 testing, and possible admission depending on your condition and test results.  What to do if you are LOW RISK for COVID-19?  Reduce your risk of any infection by using the same precautions used for avoiding the common cold or flu:  Marland Kitchen Wash your hands often with soap and warm water for  at  least 20 seconds.  If soap and water are not readily available, use an alcohol-based hand sanitizer with at least 60% alcohol.  . If coughing or sneezing, cover your mouth and nose by coughing or sneezing into the elbow areas of your shirt or coat, into a tissue or into your sleeve (not your hands). . Avoid shaking hands with others and consider head nods or verbal greetings only. . Avoid touching your eyes, nose, or mouth with unwashed hands.  . Avoid close contact with people who are sick. . Avoid places or events with large numbers of people in one location, like concerts or sporting events. . Carefully consider travel plans you have or are making. . If you are planning any travel outside or inside the [place], visit the CDC's Travelers' Health webpage for the latest health notices. . If you have some symptoms but not all symptoms, continue to monitor at home and seek medical attention if your symptoms worsen. . If you are having a medical emergency, call 911.   [place] / e-Visit: [email or web address]         [facility]: [facility]: [phone]                   [facility]: [phone]

## 2018-09-13 ENCOUNTER — Telehealth: Payer: Self-pay | Admitting: *Deleted

## 2018-09-13 ENCOUNTER — Ambulatory Visit (INDEPENDENT_AMBULATORY_CARE_PROVIDER_SITE_OTHER): Payer: Medicaid Other

## 2018-09-13 DIAGNOSIS — Z363 Encounter for antenatal screening for malformations: Secondary | ICD-10-CM

## 2018-09-13 DIAGNOSIS — Z3A19 19 weeks gestation of pregnancy: Secondary | ICD-10-CM

## 2018-09-13 DIAGNOSIS — Z3482 Encounter for supervision of other normal pregnancy, second trimester: Secondary | ICD-10-CM

## 2018-09-13 NOTE — Progress Notes (Signed)
[place] 19+4 wks,breech,anterior placenta gr 0,normal ovaries bilateral,svp of fluid 4.8 cm,fhr 152 bpm,cervical length 3.9 cm (images of cx and pl 88 and 89),EFW 294 g 38%,limited ultrasound because of pt body habitus,need additional images of the heart

## 2018-09-14 LAB — URINE CULTURE: Organism ID, Bacteria: NO GROWTH

## 2018-09-20 NOTE — Telephone Encounter (Signed)
Opened in error

## 2018-09-21 ENCOUNTER — Telehealth: Payer: Self-pay | Admitting: *Deleted

## 2018-09-21 NOTE — Telephone Encounter (Signed)
Patient states she is concerned because she has not felt the baby move in 24 hours.  Discussed kick counts with her and will start those at 20 weeks but will get patient in for [facility] check.

## 2018-09-22 LAB — INTEGRATED 2
AFP MoM: 20.53
Alpha-Fetoprotein: 401.2 ng/mL
Crown Rump Length: 73.8 mm
DIA MoM: 0.95
DIA Value: 108.9 pg/mL
Estriol, Unconjugated: 1.04 ng/mL
Gest. Age on Collection Date: 13.3 weeks
Gestational Age: 19.1 weeks
Maternal Age at EDD: 28.3 yr
Nuchal Translucency (NT): 1.9 mm
Nuchal Translucency MoM: 1.12
Number of Fetuses: 1
PAPP-A MoM: 0.69
PAPP-A Value: 277.3 ng/mL
Test Results:: POSITIVE — AB
Weight: 352 [lb_av]
Weight: 357 [lb_av]
hCG MoM: 1.44
hCG Value: 15.3 IU/mL
uE3 MoM: 0.83

## 2018-09-26 ENCOUNTER — Telehealth: Payer: Self-pay | Admitting: Women's Health

## 2018-09-26 DIAGNOSIS — Z3482 Encounter for supervision of other normal pregnancy, second trimester: Secondary | ICD-10-CM

## 2018-09-26 DIAGNOSIS — O285 Abnormal chromosomal and genetic finding on antenatal screening of mother: Secondary | ICD-10-CM

## 2018-09-26 NOTE — Telephone Encounter (Signed)
Notified pt of increased r/f spina bifida on nt/it, offered detailed u/s w/ [facility] and genetic counselor appt, wants to do. Will order and call her w/ appt date/time.  Roma Schanz, CNM, [facility] 09/26/2018 3:34 PM

## 2018-09-28 ENCOUNTER — Telehealth: Payer: Self-pay | Admitting: *Deleted

## 2018-09-28 NOTE — Telephone Encounter (Signed)
Patient informed she has [facility] appt scheduled on 6/10.  States she was already aware as someone from [facility] called her.

## 2018-10-04 ENCOUNTER — Ambulatory Visit (HOSPITAL_COMMUNITY)
Admission: RE | Admit: 2018-10-04 | Discharge: 2018-10-04 | Disposition: A | Discharge status: Home / Self Care | Payer: BC Managed Care – PPO | Source: Ambulatory Visit | Attending: Obstetrics and Gynecology | Admitting: Obstetrics and Gynecology

## 2018-10-04 ENCOUNTER — Ambulatory Visit (HOSPITAL_COMMUNITY): Payer: BC Managed Care – PPO | Admitting: *Deleted

## 2018-10-04 ENCOUNTER — Other Ambulatory Visit: Payer: Self-pay

## 2018-10-04 ENCOUNTER — Ambulatory Visit (HOSPITAL_COMMUNITY): Payer: Self-pay | Admitting: Obstetrics and Gynecology

## 2018-10-04 ENCOUNTER — Ambulatory Visit (HOSPITAL_COMMUNITY): Payer: BC Managed Care – PPO

## 2018-10-04 ENCOUNTER — Encounter (HOSPITAL_COMMUNITY): Payer: Self-pay

## 2018-10-04 ENCOUNTER — Other Ambulatory Visit (HOSPITAL_COMMUNITY): Payer: Self-pay | Admitting: *Deleted

## 2018-10-04 VITALS — BP 107/92 | HR 96 | Temp 98.6°F

## 2018-10-04 DIAGNOSIS — R772 Abnormality of alphafetoprotein: Secondary | ICD-10-CM

## 2018-10-04 DIAGNOSIS — O285 Abnormal chromosomal and genetic finding on antenatal screening of mother: Secondary | ICD-10-CM | POA: Diagnosis not present

## 2018-10-04 DIAGNOSIS — Z363 Encounter for antenatal screening for malformations: Secondary | ICD-10-CM

## 2018-10-04 DIAGNOSIS — O43192 Other malformation of placenta, second trimester: Secondary | ICD-10-CM

## 2018-10-04 DIAGNOSIS — O0990 Supervision of high risk pregnancy, unspecified, unspecified trimester: Secondary | ICD-10-CM | POA: Diagnosis present

## 2018-10-04 DIAGNOSIS — O99212 Obesity complicating pregnancy, second trimester: Secondary | ICD-10-CM

## 2018-10-04 DIAGNOSIS — O43199 Other malformation of placenta, unspecified trimester: Secondary | ICD-10-CM

## 2018-10-04 DIAGNOSIS — O289 Unspecified abnormal findings on antenatal screening of mother: Secondary | ICD-10-CM | POA: Diagnosis not present

## 2018-10-04 DIAGNOSIS — Z3A22 22 weeks gestation of pregnancy: Secondary | ICD-10-CM

## 2018-10-04 NOTE — Progress Notes (Signed)
Pt in genetic counseling with [place], telegenetics with [facility].

## 2018-10-09 ENCOUNTER — Telehealth: Payer: Self-pay | Admitting: *Deleted

## 2018-10-09 NOTE — Telephone Encounter (Signed)
Spoke to patient and informed she does not need U/S as she just had one at [facility] and is going back on 6/30.  Will keep appt with provider as scheduled.   Patient informed we are still not allowing any visitors or children to come in during appointment time unless physical assistance is needed. Asked if has had any exposure to anyone suspected or confirmed of having COVID-19 or if she was experiencing any of the following, to reschedule: fever, cough, shortness of breath, muscle pain, diarrhea, rash, vomiting, abdominal pain, red eye, weakness, bruising, bleeding, joint pain, or a severe headache.  Stated no to all. Also advised to call our office on arrival in our office parking lot to complete registration over the phone. Advised to also use the provided hand sanitizer when entering the office and to wear a mask if she has one, if not, we will provide one. Pt verbalized understanding.

## 2018-10-10 ENCOUNTER — Encounter: Payer: Self-pay | Admitting: Obstetrics & Gynecology

## 2018-10-10 ENCOUNTER — Other Ambulatory Visit: Payer: Self-pay

## 2018-10-10 ENCOUNTER — Ambulatory Visit (INDEPENDENT_AMBULATORY_CARE_PROVIDER_SITE_OTHER): Payer: BC Managed Care – PPO | Admitting: Obstetrics & Gynecology

## 2018-10-10 ENCOUNTER — Other Ambulatory Visit: Payer: BLUE CROSS/BLUE SHIELD

## 2018-10-10 VITALS — BP 118/70 | HR 94 | Wt 362.8 lb

## 2018-10-10 DIAGNOSIS — Z3482 Encounter for supervision of other normal pregnancy, second trimester: Secondary | ICD-10-CM

## 2018-10-10 DIAGNOSIS — Z3A23 23 weeks gestation of pregnancy: Secondary | ICD-10-CM

## 2018-10-10 DIAGNOSIS — Z331 Pregnant state, incidental: Secondary | ICD-10-CM

## 2018-10-10 DIAGNOSIS — Z1389 Encounter for screening for other disorder: Secondary | ICD-10-CM

## 2018-10-10 NOTE — Progress Notes (Signed)
   LOW-RISK PREGNANCY VISIT Patient name: Janice Taylor MRN [id number]  Date of birth: 15-Aug-1990 Chief Complaint:   Routine Prenatal Visit  History of Present Illness:   Janice Taylor is a 28 y.o. G31P1011 female at [redacted]w[redacted]d with an Estimated Date of Delivery: 02/03/19 being seen today for ongoing management of a low-risk pregnancy.  Today she reports no complaints. Contractions: Not present. Vag. Bleeding: None.  Movement: Present. denies leaking of fluid. Review of Systems:   Pertinent items are noted in HPI Denies abnormal vaginal discharge w/ itching/odor/irritation, headaches, visual changes, shortness of breath, chest pain, abdominal pain, severe nausea/vomiting, or problems with urination or bowel movements unless otherwise stated above. Pertinent History Reviewed:  Reviewed past medical,surgical, social, obstetrical and family history.  Reviewed problem list, medications and allergies. Physical Assessment:   Vitals:   10/10/18 1006  BP: 118/70  Pulse: 94  Weight: (!) 362 lb 12.8 oz (164.6 kg)  Body mass index is 62.27 kg/m.        Physical Examination:   General appearance: Well appearing, and in no distress  Mental status: Alert, oriented to person, place, and time  Skin: Warm & dry  Cardiovascular: Normal heart rate noted  Respiratory: Normal respiratory effort, no distress  Abdomen: Soft, gravid, nontender  Pelvic: Cervical exam deferred         Extremities: Edema: Trace  Fetal Status: Fetal Heart Rate (bpm): 150 Fundal Height: 24 cm Movement: Present    No results found for this or any previous visit (from the past 24 hour(s)).  Assessment & Plan:  1) Low-risk pregnancy G3P1011 at [redacted]w[redacted]d with an Estimated Date of Delivery: 02/03/19   2) Elevated AFP with normal sonogram,    Meds: No orders of the defined types were placed in this encounter.  Labs/procedures today:   Plan:  Continue routine obstetrical care   Reviewed:  labor symptoms and general obstetric  precautions including but not limited to vaginal bleeding, contractions, leaking of fluid and fetal movement were reviewed in detail with the patient.  All questions were answered  Follow-up: Return in about 4 weeks (around 11/07/2018) for PN2, LROB.  Orders Placed This Encounter  Procedures  . POC Urinalysis Dipstick OB   Florian Buff 10/10/2018 10:49 AM

## 2018-10-16 ENCOUNTER — Telehealth: Payer: Self-pay | Admitting: *Deleted

## 2018-10-16 NOTE — Telephone Encounter (Signed)
Pt would like to see if she needs an appt for her feet swelling.

## 2018-10-16 NOTE — Telephone Encounter (Signed)
Patient states she has a lot of swelling in her lower extremities.  She notices more when she is working but the swelling does not go down much at night when she sleeps.  Advised to try to elevate legs when not working and to get some compression hose to try when working.  Encouraged to push fluids as well.

## 2018-10-24 ENCOUNTER — Ambulatory Visit (HOSPITAL_COMMUNITY)
Admission: RE | Admit: 2018-10-24 | Discharge: 2018-10-24 | Disposition: A | Discharge status: Home / Self Care | Payer: Medicaid Other | Source: Ambulatory Visit | Attending: Obstetrics and Gynecology | Admitting: Obstetrics and Gynecology

## 2018-10-24 ENCOUNTER — Other Ambulatory Visit (HOSPITAL_COMMUNITY): Payer: Self-pay | Admitting: *Deleted

## 2018-10-24 ENCOUNTER — Other Ambulatory Visit: Payer: Self-pay

## 2018-10-24 DIAGNOSIS — O43192 Other malformation of placenta, second trimester: Secondary | ICD-10-CM

## 2018-10-24 DIAGNOSIS — O99212 Obesity complicating pregnancy, second trimester: Secondary | ICD-10-CM | POA: Diagnosis not present

## 2018-10-24 DIAGNOSIS — O289 Unspecified abnormal findings on antenatal screening of mother: Secondary | ICD-10-CM | POA: Diagnosis not present

## 2018-10-24 DIAGNOSIS — Z3A25 25 weeks gestation of pregnancy: Secondary | ICD-10-CM

## 2018-10-24 DIAGNOSIS — Z6841 Body Mass Index (BMI) 40.0 and over, adult: Secondary | ICD-10-CM

## 2018-10-24 DIAGNOSIS — O43199 Other malformation of placenta, unspecified trimester: Secondary | ICD-10-CM

## 2018-10-24 DIAGNOSIS — Z362 Encounter for other antenatal screening follow-up: Secondary | ICD-10-CM | POA: Diagnosis not present

## 2018-10-30 ENCOUNTER — Telehealth: Payer: Self-pay | Admitting: Obstetrics & Gynecology

## 2018-10-30 NOTE — Telephone Encounter (Signed)
[employer] called and LMOM that they had some questions about our Form question #12/  Not sure about dates to cover please refer to REF # [id number]/amp

## 2018-11-05 ENCOUNTER — Inpatient Hospital Stay (HOSPITAL_COMMUNITY)
Admission: AD | Admit: 2018-11-05 | Discharge: 2018-11-06 | Disposition: A | Discharge status: Home / Self Care | Payer: Medicaid Other | Source: Ambulatory Visit | Attending: Obstetrics & Gynecology | Admitting: Obstetrics & Gynecology

## 2018-11-05 ENCOUNTER — Encounter (HOSPITAL_COMMUNITY): Payer: Self-pay

## 2018-11-05 ENCOUNTER — Other Ambulatory Visit: Payer: Self-pay

## 2018-11-05 DIAGNOSIS — O219 Vomiting of pregnancy, unspecified: Secondary | ICD-10-CM

## 2018-11-05 DIAGNOSIS — O132 Gestational [pregnancy-induced] hypertension without significant proteinuria, second trimester: Secondary | ICD-10-CM

## 2018-11-05 DIAGNOSIS — Z3A27 27 weeks gestation of pregnancy: Secondary | ICD-10-CM | POA: Insufficient documentation

## 2018-11-05 DIAGNOSIS — Z8759 Personal history of other complications of pregnancy, childbirth and the puerperium: Secondary | ICD-10-CM | POA: Diagnosis present

## 2018-11-05 LAB — URINALYSIS, ROUTINE W REFLEX MICROSCOPIC
Glucose, UA: NEGATIVE mg/dL
Ketones, ur: NEGATIVE mg/dL
Leukocytes,Ua: NEGATIVE
Nitrite: NEGATIVE
Protein, ur: 30 mg/dL — AB
RBC / HPF: >50 RBC/hpf — ABNORMAL HIGH (ref 0–5)
Specific Gravity, Urine: 1.027 (ref 1.005–1.030)
pH: 5 (ref 5.0–8.0)

## 2018-11-05 LAB — PROTEIN / CREATININE RATIO, URINE
Creatinine, Urine: 277.26 mg/dL
Protein Creatinine Ratio: 0.08 mg/mg{Cre} (ref 0.00–0.15)
Total Protein, Urine: 23 mg/dL

## 2018-11-05 NOTE — MAU Note (Signed)
Pt here for swollen feet and legs for the last few days. Pt reports high BP, that was 180/95 at works. Works in ED. Pt denies HA or vision changes. Pt reports intermittent lower back pain. Pt also reports ?leaking since yesterday. States it is "not a lot, but feels like underwear are wet." Denies bleeding. Reports fetal movement.

## 2018-11-05 NOTE — MAU Provider Note (Signed)
History     CSN: [id number]  Arrival date and time: 11/05/18 2101   First Provider Initiated Contact with Patient 11/05/18 2302      Chief Complaint  Patient presents with  . Hypertension  . swollen feet   Janice Taylor is a 28 y.o. G3P1011 at [redacted]w[redacted]d who receives care at [facility].  She presents today for Hypertension and swollen feet. She states she was at work and took her blood pressure which was 180/95.  She reports she called the on-call nurse and was told to report to the ER immediately, but stayed at work until 630pm.  She also reports that her feet are swollen and states she does not feel like it is going down like it used to. She further states that she thinks the swelling is radiating up her legs.  She endorses fetal movment, but states it is decreased.  She denies vaginal concerns and contractions.       OB History    Gravida  3   Para  1   Term  1   Preterm      AB  1   Living  1     SAB  1   TAB      Ectopic      Multiple      Live Births  1           Past Medical History:  Diagnosis Date  . ADHD (attention deficit hyperactivity disorder)   . Anxiety   . Clotting disorder ([place])   . Cough   . Depression   . 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 Leg swelling   . Obesity 01/05/2013  . Thrombocytopenia ([place]) 11/25/2011   Consistent with ITP    Past Surgical History:  Procedure Laterality Date  . TONSILECTOMY, ADENOIDECTOMY, BILATERAL MYRINGOTOMY AND TUBES      Family History  Problem Relation Age of Onset  . Diabetes Maternal Uncle   . Kidney disease Maternal Uncle        renal failure/dialysis  . Heart attack Maternal Grandmother     Social History   Tobacco Use  . Smoking status: Never Smoker  . Smokeless tobacco: Never Used  Substance Use Topics  . Alcohol use: No  . Drug use: No    Allergies:  Allergies  Allergen Reactions  . Codeine Nausea And Vomiting  . Ondansetron Hcl Other (See  Comments)    Not effective`    Medications Prior to Admission  Medication Sig Dispense Refill Last Dose  . acyclovir (ZOVIRAX) 400 MG tablet Take 1 tablet by mouth daily as needed.  0   . Doxylamine-Pyridoxine ER (BONJESTA) 20-20 MG TBCR Take 1 tablet by mouth at bedtime. Can add 1 tablet in the morning if needed for nausea and vomiting (Patient not taking: Reported on 09/12/2018) 60 tablet 8   . hydrOXYzine (ATARAX/VISTARIL) 25 MG tablet Take 1 tablet (25 mg total) by mouth every 6 (six) hours as needed for anxiety. (Patient not taking: Reported on 08/02/2018) 30 tablet 1   . metoCLOPramide (REGLAN) 10 MG tablet Take 1 tablet (10 mg total) by mouth every 8 (eight) hours as needed. (Patient not taking: Reported on 09/12/2018) 30 tablet 2   . Pediatric Multiple Vit-C-FA (FLINSTONES GUMMIES OMEGA-3 DHA) CHEW Chew 1 tablet by mouth daily.     . Prenatal Vit-Fe Fumarate-FA (MULTIVITAMIN-PRENATAL) 27-0.8 MG TABS tablet Take 1 tablet by mouth daily at 12 noon.     Marland Kitchen  sertraline (ZOLOFT) 25 MG tablet Take 1 tablet (25 mg total) by mouth daily. (Patient not taking: Reported on 07/05/2018) 30 tablet 1   . traMADol-acetaminophen (ULTRACET) 37.5-325 MG tablet Take 2 tablets by mouth every 12 (twelve) hours.     Marland Kitchen zolpidem (AMBIEN) 5 MG tablet Take one tablet at bedtime as needed (Patient taking differently: Take 5 mg by mouth at bedtime. Take one tablet at bedtime as needed) 30 tablet 0 Unknown at Unknown time    Review of Systems  Constitutional: Negative for chills and fever.  Eyes: Negative for visual disturbance.  Respiratory: Negative for cough and shortness of breath.   Gastrointestinal: Positive for vomiting (Daily). Negative for abdominal pain, constipation, diarrhea and nausea.  Genitourinary: Negative for difficulty urinating and dysuria.  Musculoskeletal: Positive for back pain (States she takes muscle relaxant and took 2 today. ).  Neurological: Negative for dizziness, light-headedness and  headaches.   Physical Exam   Blood pressure (!) 120/92, pulse 95, temperature 98.3 F (36.8 C), temperature source Oral, resp. rate 20, height 5\' 4"  (1.626 m), weight (!) 167.8 kg, last menstrual period 04/29/2018, SpO2 100 %. Vitals:   11/05/18 2315 11/05/18 2331 11/05/18 2345 11/06/18 0015  BP: (!) 158/93 (!) 159/82 (!) 168/94 (!) 159/88  Pulse: 91 91 88 99  Resp:      Temp:      TempSrc:      SpO2:      Weight:      Height:        Physical Exam  Constitutional: She is oriented to person, place, and time. No distress.  Morbidly Obese  HENT:  Head: Normocephalic and atraumatic.  Eyes: Conjunctivae are normal.  Neck: Normal range of motion.  Cardiovascular: Normal rate, regular rhythm and normal heart sounds.  Respiratory: Effort normal and breath sounds normal.  GI: Soft.  Musculoskeletal: Normal range of motion.        General: Edema (+2 Pitting in BLE) present.  Neurological: She is alert and oriented to person, place, and time.  Skin: Skin is warm and dry.  Psychiatric: She has a normal mood and affect. Her behavior is normal.    Fetal Assessment 150 bpm, Mod Var, -Decels, -Accels Toco: None graphed -Difficult to assess d/t body habitus MAU Course   Results for orders placed or performed during the hospital encounter of 11/05/18 (from the past 24 hour(s))  Urinalysis, Routine w reflex microscopic     Status: Abnormal   Collection Time: 11/05/18 10:01 PM  Result Value Ref Range   Color, Urine YELLOW YELLOW   APPearance HAZY (A) CLEAR   Specific Gravity, Urine 1.027 1.005 - 1.030   pH 5.0 5.0 - 8.0   Glucose, UA NEGATIVE NEGATIVE mg/dL   Hgb urine dipstick MODERATE (A) NEGATIVE   Bilirubin Urine SMALL (A) NEGATIVE   Ketones, ur NEGATIVE NEGATIVE mg/dL   Protein, ur 30 (A) NEGATIVE mg/dL   Nitrite NEGATIVE NEGATIVE   Leukocytes,Ua NEGATIVE NEGATIVE   RBC / HPF >50 (H) 0 - 5 RBC/hpf   WBC, UA 6-10 0 - 5 WBC/hpf   Bacteria, UA FEW (A) NONE SEEN   Squamous  Epithelial / LPF 6-10 0 - 5   Mucus PRESENT    Ca Oxalate Crys, UA PRESENT   Protein / creatinine ratio, urine     Status: None   Collection Time: 11/05/18 10:01 PM  Result Value Ref Range   Creatinine, Urine 277.26 mg/dL   Total Protein, Urine 23 mg/dL  Protein Creatinine Ratio 0.08 0.00 - 0.15 mg/mg[Cre]  CBC     Status: Abnormal   Collection Time: 11/05/18 11:26 PM  Result Value Ref Range   WBC 11.6 (H) 4.0 - 10.5 K/uL   RBC 4.12 3.87 - 5.11 MIL/uL   Hemoglobin 12.1 12.0 - 15.0 g/dL   HCT 36.9 36.0 - 46.0 %   MCV 89.6 80.0 - 100.0 fL   MCH 29.4 26.0 - 34.0 pg   MCHC 32.8 30.0 - 36.0 g/dL   RDW 12.6 11.5 - 15.5 %   Platelets 187 150 - 400 K/uL   nRBC 0.0 0.0 - 0.2 %  Comprehensive metabolic panel     Status: Abnormal   Collection Time: 11/05/18 11:26 PM  Result Value Ref Range   Sodium 138 135 - 145 mmol/L   Potassium 3.6 3.5 - 5.1 mmol/L   Chloride 106 98 - 111 mmol/L   CO2 23 22 - 32 mmol/L   Glucose, Bld 96 70 - 99 mg/dL   BUN 6 6 - 20 mg/dL   Creatinine, Ser 0.69 0.44 - 1.00 mg/dL   Calcium 8.9 8.9 - 10.3 mg/dL   Total Protein 6.0 (L) 6.5 - 8.1 g/dL   Albumin 2.7 (L) 3.5 - 5.0 g/dL   AST 16 15 - 41 U/L   ALT 16 0 - 44 U/L   Alkaline Phosphatase 77 38 - 126 U/L   Total Bilirubin 0.4 0.3 - 1.2 mg/dL   GFR calc non Af Amer >60 >60 mL/min   GFR calc Af Amer >60 >60 mL/min   Anion gap 9 5 - 15   No results found.  MDM Physical Exam Labs: CBC, CMP, PC Ratio, UA, UC Measure BPQ15 min EFM Assessment and Plan  28 year old G3P1011  SIUP at 27.1weeks Reassuring FT GHTN  -Exam findings discussed. -Informed of need for labs to r/o preeclampsia. -Discussed GHTN diagnosis since initial elevated bp was more than 4 hours ago. -Discussed possibility of overnight monitoring/observation if need for IV antihypertensives become necessary.  -No questions or concerns. -Will await for results.  Follow Up (12:43 AM) GHTN w/o significant proteinuria  Nausea  -Dr. Elonda Husky  consulted and informed of patient status, labs, and POC.  Agrees with initiation of oral antihypertensives and follow up this week in office. -Will start on Labetalol 300mg  TID and give initial dose prior to discharge. -Patient has in-office appt scheduled for July 14th.  -Patient reports some new onset nausea and requests phenergan which she takes at home.   -Patient OOW until July 15th unless in office evaluation deems it appropriate for additional time. -Rx for 30 day supply Labetalol 300mg  and Phenergan 25mg  tablets sent to pharmacy on file.  -Encouraged to call or return to MAU if symptoms worsen or with the onset of new symptoms. -Discharged to home in stable condition.  Maryann Conners MSN, CNM 11/05/2018, 11:02 PM

## 2018-11-06 ENCOUNTER — Telehealth: Payer: Self-pay | Admitting: Women's Health

## 2018-11-06 DIAGNOSIS — Z3A27 27 weeks gestation of pregnancy: Secondary | ICD-10-CM

## 2018-11-06 DIAGNOSIS — O219 Vomiting of pregnancy, unspecified: Secondary | ICD-10-CM

## 2018-11-06 DIAGNOSIS — Z8759 Personal history of other complications of pregnancy, childbirth and the puerperium: Secondary | ICD-10-CM | POA: Diagnosis present

## 2018-11-06 DIAGNOSIS — O132 Gestational [pregnancy-induced] hypertension without significant proteinuria, second trimester: Secondary | ICD-10-CM

## 2018-11-06 LAB — COMPREHENSIVE METABOLIC PANEL
ALT: 16 U/L (ref 0–44)
AST: 16 U/L (ref 15–41)
Albumin: 2.7 g/dL — ABNORMAL LOW (ref 3.5–5.0)
Alkaline Phosphatase: 77 U/L (ref 38–126)
Anion gap: 9 (ref 5–15)
BUN: 6 mg/dL (ref 6–20)
CO2: 23 mmol/L (ref 22–32)
Calcium: 8.9 mg/dL (ref 8.9–10.3)
Chloride: 106 mmol/L (ref 98–111)
Creatinine, Ser: 0.69 mg/dL (ref 0.44–1.00)
GFR calc Af Amer: >60 mL/min (ref >60)
GFR calc non Af Amer: >60 mL/min (ref >60)
Glucose, Bld: 96 mg/dL (ref 70–99)
Potassium: 3.6 mmol/L (ref 3.5–5.1)
Sodium: 138 mmol/L (ref 135–145)
Total Bilirubin: 0.4 mg/dL (ref 0.3–1.2)
Total Protein: 6 g/dL — ABNORMAL LOW (ref 6.5–8.1)

## 2018-11-06 LAB — CBC
HCT: 36.9 % (ref 36.0–46.0)
Hemoglobin: 12.1 g/dL (ref 12.0–15.0)
MCH: 29.4 pg (ref 26.0–34.0)
MCHC: 32.8 g/dL (ref 30.0–36.0)
MCV: 89.6 fL (ref 80.0–100.0)
Platelets: 187 10*3/uL (ref 150–400)
RBC: 4.12 MIL/uL (ref 3.87–5.11)
RDW: 12.6 % (ref 11.5–15.5)
WBC: 11.6 10*3/uL — ABNORMAL HIGH (ref 4.0–10.5)
nRBC: 0 % (ref 0.0–0.2)

## 2018-11-06 MED ORDER — PROMETHAZINE HCL 25 MG PO TABS: 25.0000 mg | ORAL_TABLET | Freq: Four times a day (QID) | ORAL | 0 refills | Status: DC | PRN

## 2018-11-06 MED ORDER — PROMETHAZINE HCL 25 MG PO TABS
25.0000 mg | ORAL_TABLET | Freq: Once | ORAL | Status: AC
  Administered 2018-11-06: 25 mg via ORAL
  Filled 2018-11-06: qty 1

## 2018-11-06 MED ORDER — LABETALOL HCL 300 MG PO TABS: 300.0000 mg | ORAL_TABLET | Freq: Three times a day (TID) | ORAL | 0 refills | Status: DC

## 2018-11-06 MED ORDER — LABETALOL HCL 100 MG PO TABS
300.0000 mg | ORAL_TABLET | Freq: Once | ORAL | Status: AC
  Administered 2018-11-06: 01:00:00 300 mg via ORAL
  Filled 2018-11-06: qty 3

## 2018-11-06 NOTE — Discharge Instructions (Signed)
Hypertension During Pregnancy °High blood pressure (hypertension) is when the force of blood pumping through the arteries is too strong. Arteries are blood vessels that carry blood from the heart throughout the body. Hypertension during pregnancy can be mild or severe. Severe hypertension during pregnancy (preeclampsia) is a medical emergency that requires prompt evaluation and treatment. °Different types of hypertension can happen during pregnancy. These include: °· Chronic hypertension. This happens when you had high blood pressure before you became pregnant, and it continues during the pregnancy. Hypertension that develops before you are [redacted] weeks pregnant and continues during the pregnancy is also called chronic hypertension. If you have chronic hypertension, it will not go away after you have your baby. You will need follow-up visits with your health care provider after you have your baby. Your doctor may want you to keep taking medicine for your blood pressure. °· Gestational hypertension. This is hypertension that develops after the 20th week of pregnancy. Gestational hypertension usually goes away after you have your baby, but your health care provider will need to monitor your blood pressure to make sure that it is getting better. °· Preeclampsia. This is severe hypertension during pregnancy. This can cause serious complications for you and your baby and can also cause complications for you after the delivery of your baby. °· Postpartum preeclampsia. You may develop severe hypertension after giving birth. This usually occurs within 48 hours after childbirth but may occur up to 6 weeks after giving birth. This is rare. °How does this affect me? °Women who have hypertension during pregnancy have a greater chance of developing hypertension later in life or during future pregnancies. In some cases, hypertension during pregnancy can cause serious complications, such as: °· Stroke. °· Heart attack. °· Injury to  other organs, such as kidneys, lungs, or liver. °· Preeclampsia. °· Convulsions or seizures. °· Placental abruption. °How does this affect my baby? °Hypertension during pregnancy can affect your baby. Your baby may: °· Be born early (prematurely). °· Not weigh as much as he or she should at birth (low birth weight). °· Not tolerate labor well, leading to an unplanned cesarean delivery. °What are the risks? °There are certain factors that make it more likely for you to develop hypertension during pregnancy. These include: °· Having hypertension during a previous pregnancy. °· Being overweight. °· Being age 35 or older. °· Being pregnant for the first time. °· Being pregnant with more than one baby. °· Becoming pregnant using fertilization methods, such as IVF (in vitro fertilization). °· Having other medical problems, such as diabetes, kidney disease, or lupus. °· Having a family history of hypertension. °What can I do to lower my risk? °The exact cause of hypertension during pregnancy is not known. You may be able to lower your risk by: °· Maintaining a healthy weight. °· Eating a healthy and balanced diet. °· Following your health care provider's instructions about treating any long-term conditions that you had before becoming pregnant. °It is very important to keep all of your prenatal care appointments. Your health care provider will check your blood pressure and make sure that your pregnancy is progressing as expected. If a problem is found, early treatment can prevent complications. °How is this treated? °Treatment for hypertension during pregnancy varies depending on the type of hypertension you have and how serious it is. °· If you were taking medicine for high blood pressure before you became pregnant, talk with your health care provider. You may need to change medicine during pregnancy because   some medicines, like ACE inhibitors, may not be considered safe for your baby.  If you have gestational  hypertension, your health care provider may order medicine to treat this during pregnancy.  If you are at risk for preeclampsia, your health care provider may recommend that you take a low-dose aspirin during your pregnancy.  If you have severe hypertension, you may need to be hospitalized so you and your baby can be monitored closely. You may also need to be given medicine to lower your blood pressure. This medicine may be given by mouth or through an IV.  In some cases, if your condition gets worse, you may need to deliver your baby early. Follow these instructions at home: Eating and drinking   Drink enough fluid to keep your urine pale yellow.  Avoid caffeine. Lifestyle  Do not use any products that contain nicotine or tobacco, such as cigarettes, e-cigarettes, and chewing tobacco. If you need help quitting, ask your health care provider.  Do not use alcohol or drugs.  Avoid stress as much as possible.  Rest and get plenty of sleep.  Regular exercise can help to reduce your blood pressure. Ask your health care provider what kinds of exercise are best for you. General instructions  Take over-the-counter and prescription medicines only as told by your health care provider.  Keep all prenatal and follow-up visits as told by your health care provider. This is important. Contact a health care provider if:  You have symptoms that your health care provider told you may require more treatment or monitoring, such as: ? Headaches. ? Nausea or vomiting. ? Abdominal pain. ? Dizziness. ? Light-headedness. Get help right away if:  You have: ? Severe abdominal pain that does not get better with treatment. ? A severe headache that does not get better. ? Vomiting that does not get better. ? Sudden, rapid weight gain. ? Sudden swelling in your hands, ankles, or face. ? Vaginal bleeding. ? Blood in your urine. ? Blurred or double vision. ? Shortness of breath or chest  pain. ? Weakness on one side of your body. ? Difficulty speaking.  Your baby is not moving as much as usual. Summary  High blood pressure (hypertension) is when the force of blood pumping through the arteries is too strong.  Hypertension during pregnancy can cause problems for you and your baby.  Treatment for hypertension during pregnancy varies depending on the type of hypertension you have and how serious it is.  Keep all prenatal and follow-up visits as told by your health care provider. This is important. This information is not intended to replace advice given to you by your health care provider. Make sure you discuss any questions you have with your health care provider. Document Released: 12/29/2010 Document Revised: 08/03/2018 Document Reviewed: 05/09/2018 Elsevier Patient Education  2020 [employer].

## 2018-11-06 NOTE — Telephone Encounter (Signed)

## 2018-11-06 NOTE — Telephone Encounter (Signed)
Patient states she has not been feeling well since taking her BP medication prescribed last night.  She has taken the medication twice today but has not checked her BP since taking. Before taking the medication it was 154/105. Is at work now and BP is 170/80.  Patient with multiple questions regarding GHTN and medication. Discussed all of her concerns and need for BP medication.  Has appt with Maudie Mercury tomorrow and informed Maudie Mercury will go over all her questions and concerns as well.  Pt verbalized understanding.

## 2018-11-06 NOTE — Telephone Encounter (Signed)
Patient called, stated that she went the ER last night and the meds they gave her to take are making her feel funny.  She's 27 weeks.  [phone]

## 2018-11-07 ENCOUNTER — Other Ambulatory Visit: Payer: BC Managed Care – PPO

## 2018-11-07 ENCOUNTER — Other Ambulatory Visit: Payer: Self-pay

## 2018-11-07 ENCOUNTER — Encounter: Payer: Self-pay | Admitting: Women's Health

## 2018-11-07 ENCOUNTER — Ambulatory Visit (INDEPENDENT_AMBULATORY_CARE_PROVIDER_SITE_OTHER): Payer: Medicaid Other | Admitting: Women's Health

## 2018-11-07 VITALS — BP 133/83 | HR 84 | Wt 363.8 lb

## 2018-11-07 DIAGNOSIS — Z3A27 27 weeks gestation of pregnancy: Secondary | ICD-10-CM

## 2018-11-07 DIAGNOSIS — Z23 Encounter for immunization: Secondary | ICD-10-CM

## 2018-11-07 DIAGNOSIS — O0990 Supervision of high risk pregnancy, unspecified, unspecified trimester: Secondary | ICD-10-CM

## 2018-11-07 DIAGNOSIS — O99342 Other mental disorders complicating pregnancy, second trimester: Secondary | ICD-10-CM

## 2018-11-07 DIAGNOSIS — O0992 Supervision of high risk pregnancy, unspecified, second trimester: Secondary | ICD-10-CM

## 2018-11-07 DIAGNOSIS — Z1389 Encounter for screening for other disorder: Secondary | ICD-10-CM

## 2018-11-07 DIAGNOSIS — O132 Gestational [pregnancy-induced] hypertension without significant proteinuria, second trimester: Secondary | ICD-10-CM

## 2018-11-07 DIAGNOSIS — Z331 Pregnant state, incidental: Secondary | ICD-10-CM

## 2018-11-07 DIAGNOSIS — F419 Anxiety disorder, unspecified: Secondary | ICD-10-CM

## 2018-11-07 DIAGNOSIS — O285 Abnormal chromosomal and genetic finding on antenatal screening of mother: Secondary | ICD-10-CM

## 2018-11-07 DIAGNOSIS — Z3480 Encounter for supervision of other normal pregnancy, unspecified trimester: Secondary | ICD-10-CM

## 2018-11-07 LAB — POCT URINALYSIS DIPSTICK OB
Glucose, UA: NEGATIVE
Nitrite, UA: NEGATIVE

## 2018-11-07 LAB — CULTURE, OB URINE: Culture: <10000 — AB

## 2018-11-07 MED ORDER — NITROFURANTOIN MONOHYD MACRO 100 MG PO CAPS: 100.0000 mg | ORAL_CAPSULE | Freq: Two times a day (BID) | ORAL | 0 refills | Status: DC

## 2018-11-07 MED ORDER — ZOLPIDEM TARTRATE 5 MG PO TABS: 5.0000 mg | ORAL_TABLET | Freq: Every evening | ORAL | 0 refills | Status: DC | PRN

## 2018-11-07 MED ORDER — CITALOPRAM HYDROBROMIDE 20 MG PO TABS: ORAL_TABLET | ORAL | 2 refills | Status: DC

## 2018-11-07 NOTE — Patient Instructions (Signed)
Janice Taylor, I greatly value your feedback.  If you receive a survey following your visit with [place] today, we appreciate you taking the time to fill it out.  Thanks, Janice Taylor, CNM, [facility]  [place]!!! It is now [place] at [facility] ([place], [place] [address]) Entrance located off of [address] parking    Go to [email or web address] to register for FREE online childbirth classes   Call the office [phone]) or go to [facility] if:  You begin to have strong, frequent contractions  Your water breaks.  Sometimes it is a big gush of fluid, sometimes it is just a trickle that keeps getting your panties wet or running down your legs  You have vaginal bleeding.  It is normal to have a small amount of spotting if your cervix was checked.   You don't feel your baby moving like normal.  If you don't, get you something to eat and drink and lay down and focus on feeling your baby move.  You should feel at least 10 movements in 2 hours.  If you don't, you should call the office or go to [facility].    Call the office [phone]) or go to [facility] hospital for these signs of pre-eclampsia:  Severe headache that does not go away with Tylenol  Visual changes- seeing spots, double, blurred vision  Pain under your right breast or upper abdomen that does not go away with Tums or heartburn medicine  Nausea and/or vomiting  Severe swelling in your hands, feet, and face      Tdap Vaccine  It is recommended that you get the Tdap vaccine during the third trimester of EACH pregnancy to help protect your baby from getting pertussis (whooping cough)  27-36 weeks is the BEST time to do this so that you can pass the protection on to your baby. During pregnancy is better than after pregnancy, but if you are unable to get it during pregnancy it will be offered at the hospital.   You can get this vaccine with [place], at the health  department, your family doctor, or some local pharmacies  Everyone who will be around your baby should also be up-to-date on their vaccines before the baby comes. Adults (who are not pregnant) only need 1 dose of Tdap during adulthood.   [place] Pediatricians/Family Doctors:  [facility] [phone]                 [place] [phone] (usually not accepting new patients unless you have family there already, you are always welcome to call and ask)       [facility] Department [phone]       [facility] Pediatricians/Family Doctors:   [facility]: [phone]  [facility]: [phone]  [facility]: [place] Doctors:   [facility]: [facility]: [place]:  [place]: [phone]   Home Blood Pressure Monitoring for Patients   Your provider has recommended that you check your blood pressure (BP) at least once a week at home. If you do not have a blood pressure cuff at home, one will be provided for you. Contact your provider if you have not received your monitor within 1 week.   Helpful Tips for Accurate Home Blood Pressure  Checks  . Don't smoke, exercise, or drink caffeine 30 minutes before checking your BP . Use the restroom before checking your BP (a full bladder can raise your pressure) . Relax in a comfortable upright chair . Feet on the ground . Left arm resting comfortably on a flat surface at the level of your heart . Legs uncrossed . Back supported . Sit quietly and don't talk . Place the cuff on your bare arm . Adjust snuggly, so that only two fingertips can fit between your skin and the top of the cuff . Check 2 readings separated by at least one minute . Keep a log of your BP readings . For a visual, please  reference this diagram: http://ccnc.care/bpdiagram  Provider Name: [facility]     Phone: [phone]  Zone 1: ALL CLEAR  Continue to monitor your symptoms:  . BP reading is less than 140 (top number) or less than 90 (bottom number)  . No right upper stomach pain . No headaches or seeing spots . No feeling nauseated or throwing up . No swelling in face and hands  Zone 2: CAUTION Call your doctor's office for any of the following:  . BP reading is greater than 140 (top number) or greater than 90 (bottom number)  . Stomach pain under your ribs in the middle or right side . Headaches or seeing spots . Feeling nauseated or throwing up . Swelling in face and hands  Zone 3: EMERGENCY  Seek immediate medical care if you have any of the following:  . BP reading is greater than160 (top number) or greater than 110 (bottom number) . Severe headaches not improving with Tylenol . Serious difficulty catching your breath . Any worsening symptoms from Zone 2   Third Trimester of Pregnancy The third trimester is from week 29 through week 42, months 7 through 9. The third trimester is a time when the fetus is growing rapidly. At the end of the ninth month, the fetus is about 20 inches in length and weighs 6-10 pounds.  BODY CHANGES Your body goes through many changes during pregnancy. The changes vary from woman to woman.   Your weight will continue to increase. You can expect to gain 25-35 pounds (11-16 kg) by the end of the pregnancy.  You may begin to get stretch marks on your hips, abdomen, and breasts.  You may urinate more often because the fetus is moving lower into your pelvis and pressing on your bladder.  You may develop or continue to have heartburn as a result of your pregnancy.  You may develop constipation because certain hormones are causing the muscles that push waste through your intestines to slow down.  You may develop hemorrhoids or swollen, bulging veins (varicose  veins).  You may have pelvic pain because of the weight gain and pregnancy hormones relaxing your joints between the bones in your pelvis. Backaches may result from overexertion of the muscles supporting your posture.  You may have changes in your hair. These can include thickening of your hair, rapid growth, and changes in texture. Some women also have hair loss during or after pregnancy, or hair that feels dry or thin. Your hair will most likely return to normal after your baby is born.  Your breasts will continue to grow and be tender. A yellow discharge may leak from your breasts called colostrum.  Your belly button may stick out.  You may feel short of breath because of your expanding uterus.  You  may notice the fetus "dropping," or moving lower in your abdomen.  You may have a bloody mucus discharge. This usually occurs a few days to a week before labor begins.  Your cervix becomes thin and soft (effaced) near your due date. WHAT TO EXPECT AT YOUR PRENATAL EXAMS  You will have prenatal exams every 2 weeks until week 36. Then, you will have weekly prenatal exams. During a routine prenatal visit:  You will be weighed to make sure you and the fetus are growing normally.  Your blood pressure is taken.  Your abdomen will be measured to track your baby's growth.  The fetal heartbeat will be listened to.  Any test results from the previous visit will be discussed.  You may have a cervical check near your due date to see if you have effaced. At around 36 weeks, your caregiver will check your cervix. At the same time, your caregiver will also perform a test on the secretions of the vaginal tissue. This test is to determine if a type of bacteria, Group B streptococcus, is present. Your caregiver will explain this further. Your caregiver may ask you:  What your birth plan is.  How you are feeling.  If you are feeling the baby move.  If you have had any abnormal symptoms, such as  leaking fluid, bleeding, severe headaches, or abdominal cramping.  If you have any questions. Other tests or screenings that may be performed during your third trimester include:  Blood tests that check for low iron levels (anemia).  Fetal testing to check the health, activity level, and growth of the fetus. Testing is done if you have certain medical conditions or if there are problems during the pregnancy. FALSE LABOR You may feel small, irregular contractions that eventually go away. These are called Braxton Hicks contractions, or false labor. Contractions may last for hours, days, or even weeks before true labor sets in. If contractions come at regular intervals, intensify, or become painful, it is best to be seen by your caregiver.  SIGNS OF LABOR   Menstrual-like cramps.  Contractions that are 5 minutes apart or less.  Contractions that start on the top of the uterus and spread down to the lower abdomen and back.  A sense of increased pelvic pressure or back pain.  A watery or bloody mucus discharge that comes from the vagina. If you have any of these signs before the 37th week of pregnancy, call your caregiver right away. You need to go to the hospital to get checked immediately. HOME CARE INSTRUCTIONS   Avoid all smoking, herbs, alcohol, and unprescribed drugs. These chemicals affect the formation and growth of the baby.  Follow your caregiver's instructions regarding medicine use. There are medicines that are either safe or unsafe to take during pregnancy.  Exercise only as directed by your caregiver. Experiencing uterine cramps is a good sign to stop exercising.  Continue to eat regular, healthy meals.  Wear a good support bra for breast tenderness.  Do not use hot tubs, steam rooms, or saunas.  Wear your seat belt at all times when driving.  Avoid raw meat, uncooked cheese, cat litter boxes, and soil used by cats. These carry germs that can cause birth defects in the  baby.  Take your prenatal vitamins.  Try taking a stool softener (if your caregiver approves) if you develop constipation. Eat more high-fiber foods, such as fresh vegetables or fruit and whole grains. Drink plenty of fluids to keep your urine clear  or pale yellow.  Take warm sitz baths to soothe any pain or discomfort caused by hemorrhoids. Use hemorrhoid cream if your caregiver approves.  If you develop varicose veins, wear support hose. Elevate your feet for 15 minutes, 3-4 times a day. Limit salt in your diet.  Avoid heavy lifting, wear low heal shoes, and practice good posture.  Rest a lot with your legs elevated if you have leg cramps or low back pain.  Visit your dentist if you have not gone during your pregnancy. Use a soft toothbrush to brush your teeth and be gentle when you floss.  A sexual relationship may be continued unless your caregiver directs you otherwise.  Do not travel far distances unless it is absolutely necessary and only with the approval of your caregiver.  Take prenatal classes to understand, practice, and ask questions about the labor and delivery.  Make a trial run to the hospital.  Pack your hospital bag.  Prepare the baby's nursery.  Continue to go to all your prenatal visits as directed by your caregiver. SEEK MEDICAL CARE IF:  You are unsure if you are in labor or if your water has broken.  You have dizziness.  You have mild pelvic cramps, pelvic pressure, or nagging pain in your abdominal area.  You have persistent nausea, vomiting, or diarrhea.  You have a bad smelling vaginal discharge.  You have pain with urination. SEEK IMMEDIATE MEDICAL CARE IF:   You have a fever.  You are leaking fluid from your vagina.  You have spotting or bleeding from your vagina.  You have severe abdominal cramping or pain.  You have rapid weight loss or gain.  You have shortness of breath with chest pain.  You notice sudden or extreme swelling of  your face, hands, ankles, feet, or legs.  You have not felt your baby move in over an hour.  You have severe headaches that do not go away with medicine.  You have vision changes. Document Released: 04/06/2001 Document Revised: 04/17/2013 Document Reviewed: 06/13/2012 [facility] Patient Information 2015 [place], [place]. This information is not intended to replace advice given to you by your health care provider. Make sure you discuss any questions you have with your health care provider.

## 2018-11-07 NOTE — Progress Notes (Signed)
HIGH-RISK PREGNANCY VISIT Patient name: Janice Taylor MRN [id number]  Date of birth: 09-21-1990 Chief Complaint:   Routine Prenatal Visit (PN2/ having trouble peeing and when does pee a little)  History of Present Illness:   Janice Taylor is a 28 y.o. G22P1011 female at [redacted]w[redacted]d with an Estimated Date of Delivery: 02/03/19 being seen today for ongoing management of a high-risk pregnancy complicated by GHTN on Labetalol 300mg  TID dx 2d ago, increased AFP w/ 1:10 increased r/f OSB, marginal cord insertion.   Today she reports went to MAU 7/12 w/ HTN, dx w/ GHTN, pre-e labs normal, started on Labetalol 300mg  TID. Feels like she has a UTI, urge to go but only voids small amt. Denies visual changes, ruq/epigastric pain, n/v. Had headache yesterday that went away w/ APAP.  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 Requests refill on Ambien, has been taking it nightly x 48yrs. Has h/o anxiety- was on xanax prior to pregnancy, feels she needs to get back on something. Denies SI/HI.  Contractions: Not present.  .  Movement: Present. denies leaking of fluid.  Review of Systems:   Pertinent items are noted in HPI Denies abnormal vaginal discharge w/ itching/odor/irritation, headaches, visual changes, shortness of breath, chest pain, abdominal pain, severe nausea/vomiting, or problems with urination or bowel movements unless otherwise stated above. Pertinent History Reviewed:  Reviewed past medical,surgical, social, obstetrical and family history.  Reviewed problem list, medications and allergies. Physical Assessment:   Vitals:   11/07/18 1028  BP: 133/83  Pulse: 84  Weight: (!) 363 lb 12.8 oz (165 kg)  Body mass index is 62.45 kg/m.           Physical Examination:   General appearance: alert, well appearing, and in no distress  Mental status: alert, oriented to person, place, and time  Skin: warm & dry   Extremities: Edema: Mild pitting, slight indentation    Cardiovascular: normal heart  rate noted  Respiratory: normal respiratory effort, no distress  Abdomen: gravid, soft, non-tender  Pelvic: Cervical exam deferred         Fetal Status: Fetal Heart Rate (bpm): 160 Fundal Height: 29 cm Movement: Present    Fetal Surveillance Testing today: doppler   Results for orders placed or performed in visit on 11/07/18 (from the past 24 hour(s))  POC Urinalysis Dipstick OB   Collection Time: 11/07/18 10:37 AM  Result Value Ref Range   Color, UA     Clarity, UA     Glucose, UA Negative Negative   Bilirubin, UA     Ketones, UA small    Spec Grav, UA     Blood, UA small    pH, UA     POC,PROTEIN,UA Trace Negative, Trace, Small (1+), Moderate (2+), Large (3+), 4+   Urobilinogen, UA     Nitrite, UA neg    Leukocytes, UA Small (1+) (A) Negative   Appearance     Odor      Assessment & Plan:  1) High-risk pregnancy G3P1011 at [redacted]w[redacted]d with an Estimated Date of Delivery: 02/03/19   2) GHTN, dx 2d ago, labs normal, stable on Labetalol 300mg  TID, reviewed pre-e s/s, reasons to seek care. Check BP QID, reviewed severe-range BPs. 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 zolpidem (AMBIEN) 5 MG tablet    Sig: Take 1 tablet (5 mg total) by mouth at bedtime as needed for sleep.    Dispense:  30 tablet    Refill:  0    Order Specific Question:   Supervising Provider    Answer:   Elonda Husky, LUTHER H [[id number]]  . citalopram (CELEXA) 20 MG tablet    Sig: Take 10mg  (1/2 tablet) daily x 1 week, then 20mg  (1 tablet) daily thereafter    Dispense:  30 tablet    Refill:  2     Order Specific Question:   Supervising Provider    Answer:   Elonda Husky, LUTHER H [[id number]]  . nitrofurantoin, macrocrystal-monohydrate, (MACROBID) 100 MG capsule    Sig: Take 1 capsule (100 mg total) by mouth 2 (two) times daily. X 7 days    Dispense:  14 capsule    Refill:  0    Order Specific Question:   Supervising Provider    Answer:   Elonda Husky, LUTHER H [[id number]]    Labs/procedures today: tdap, pn2  Treatment Plan:  Growth u/s q 4wks     Weekly BPPs @ 28wks     2x/wk testing nst alt w/ bpp/dopp @ 32wks or weekly BPP       Deliver @ 37wks   Reviewed: Preterm labor symptoms and general obstetric precautions including but not limited to vaginal bleeding, contractions, leaking of fluid and fetal movement were reviewed in detail with the patient.  All questions were answered.   Follow-up: Return in about 1 week (around 11/14/2018) for bpp w/ MFM, then 7/28 bpp w/ [place] and HROB.  Orders Placed This Encounter  Procedures  . [place] MFM FETAL BPP WO NON STRESS  . Tdap vaccine greater than or equal to 7yo IM  . POC Urinalysis Dipstick OB   Roma Schanz CNM, [facility] 11/07/2018 2:16 PM

## 2018-11-08 ENCOUNTER — Telehealth: Payer: Self-pay | Admitting: *Deleted

## 2018-11-08 ENCOUNTER — Other Ambulatory Visit: Payer: Self-pay | Admitting: *Deleted

## 2018-11-08 DIAGNOSIS — O132 Gestational [pregnancy-induced] hypertension without significant proteinuria, second trimester: Secondary | ICD-10-CM

## 2018-11-08 DIAGNOSIS — O0990 Supervision of high risk pregnancy, unspecified, unspecified trimester: Secondary | ICD-10-CM

## 2018-11-08 LAB — CBC
Hematocrit: 36.8 % (ref 34.0–46.6)
Hemoglobin: 12 g/dL (ref 11.1–15.9)
MCH: 29.3 pg (ref 26.6–33.0)
MCHC: 32.6 g/dL (ref 31.5–35.7)
MCV: 90 fL (ref 79–97)
Platelets: 167 10*3/uL (ref 150–450)
RBC: 4.09 x10E6/uL (ref 3.77–5.28)
RDW: 12.4 % (ref 11.7–15.4)
WBC: 9.3 10*3/uL (ref 3.4–10.8)

## 2018-11-08 LAB — GLUCOSE TOLERANCE, 2 HOURS W/ 1HR
Glucose, 1 hour: 162 mg/dL (ref 65–179)
Glucose, 2 hour: 150 mg/dL (ref 65–152)
Glucose, Fasting: 82 mg/dL (ref 65–91)

## 2018-11-08 LAB — ANTIBODY SCREEN: Antibody Screen: NEGATIVE

## 2018-11-08 LAB — RPR: RPR Ser Ql: NONREACTIVE

## 2018-11-08 LAB — HIV ANTIBODY (ROUTINE TESTING W REFLEX): HIV Screen 4th Generation wRfx: NONREACTIVE

## 2018-11-08 NOTE — Telephone Encounter (Signed)
Spoke with [employer].  Corrections made. Will edit forms and fax back.

## 2018-11-21 ENCOUNTER — Ambulatory Visit (HOSPITAL_COMMUNITY): Payer: BC Managed Care – PPO

## 2018-11-21 ENCOUNTER — Encounter (HOSPITAL_COMMUNITY): Payer: Self-pay

## 2018-11-23 ENCOUNTER — Ambulatory Visit (HOSPITAL_COMMUNITY)
Admission: RE | Admit: 2018-11-23 | Discharge: 2018-11-23 | Disposition: A | Discharge status: Home / Self Care | Payer: Medicaid Other | Source: Ambulatory Visit | Attending: Obstetrics and Gynecology | Admitting: Obstetrics and Gynecology

## 2018-11-23 ENCOUNTER — Other Ambulatory Visit: Payer: Self-pay

## 2018-11-23 DIAGNOSIS — O289 Unspecified abnormal findings on antenatal screening of mother: Secondary | ICD-10-CM

## 2018-11-23 DIAGNOSIS — O365931 Maternal care for other known or suspected poor fetal growth, third trimester, fetus 1: Secondary | ICD-10-CM

## 2018-11-23 DIAGNOSIS — O132 Gestational [pregnancy-induced] hypertension without significant proteinuria, second trimester: Secondary | ICD-10-CM | POA: Insufficient documentation

## 2018-11-23 DIAGNOSIS — Z362 Encounter for other antenatal screening follow-up: Secondary | ICD-10-CM

## 2018-11-23 DIAGNOSIS — O0992 Supervision of high risk pregnancy, unspecified, second trimester: Secondary | ICD-10-CM | POA: Insufficient documentation

## 2018-11-23 DIAGNOSIS — O99213 Obesity complicating pregnancy, third trimester: Secondary | ICD-10-CM | POA: Diagnosis not present

## 2018-11-23 DIAGNOSIS — O0990 Supervision of high risk pregnancy, unspecified, unspecified trimester: Secondary | ICD-10-CM | POA: Insufficient documentation

## 2018-11-23 DIAGNOSIS — O43193 Other malformation of placenta, third trimester: Secondary | ICD-10-CM | POA: Diagnosis not present

## 2018-11-23 DIAGNOSIS — Z6841 Body Mass Index (BMI) 40.0 and over, adult: Secondary | ICD-10-CM | POA: Diagnosis present

## 2018-11-23 DIAGNOSIS — Z3A29 29 weeks gestation of pregnancy: Secondary | ICD-10-CM

## 2018-11-23 DIAGNOSIS — O133 Gestational [pregnancy-induced] hypertension without significant proteinuria, third trimester: Secondary | ICD-10-CM

## 2018-11-24 ENCOUNTER — Telehealth: Payer: Self-pay | Admitting: Women's Health

## 2018-11-24 ENCOUNTER — Other Ambulatory Visit (HOSPITAL_COMMUNITY): Payer: Self-pay | Admitting: *Deleted

## 2018-11-24 DIAGNOSIS — O365930 Maternal care for other known or suspected poor fetal growth, third trimester, not applicable or unspecified: Secondary | ICD-10-CM

## 2018-11-24 NOTE — Telephone Encounter (Signed)
ERROR

## 2018-11-27 ENCOUNTER — Ambulatory Visit (HOSPITAL_COMMUNITY): Payer: Medicaid Other

## 2018-11-27 ENCOUNTER — Encounter: Payer: Self-pay | Admitting: Obstetrics and Gynecology

## 2018-11-27 ENCOUNTER — Other Ambulatory Visit: Payer: Self-pay

## 2018-11-27 ENCOUNTER — Encounter (HOSPITAL_COMMUNITY): Payer: Self-pay

## 2018-11-27 ENCOUNTER — Encounter (HOSPITAL_COMMUNITY): Payer: Self-pay | Admitting: *Deleted

## 2018-11-27 ENCOUNTER — Ambulatory Visit (HOSPITAL_BASED_OUTPATIENT_CLINIC_OR_DEPARTMENT_OTHER)
Admission: RE | Admit: 2018-11-27 | Discharge: 2018-11-27 | Disposition: A | Discharge status: Home / Self Care | Payer: Medicaid Other | Source: Ambulatory Visit | Attending: Obstetrics and Gynecology | Admitting: Obstetrics and Gynecology

## 2018-11-27 ENCOUNTER — Inpatient Hospital Stay (HOSPITAL_COMMUNITY)
Admission: AD | Admit: 2018-11-27 | Discharge: 2018-11-28 | DRG: 833 | Disposition: A | Discharge status: Home / Self Care | Payer: Medicaid Other | Attending: Obstetrics and Gynecology | Admitting: Obstetrics and Gynecology

## 2018-11-27 ENCOUNTER — Other Ambulatory Visit: Payer: Self-pay | Admitting: Obstetrics and Gynecology

## 2018-11-27 ENCOUNTER — Ambulatory Visit (HOSPITAL_COMMUNITY): Payer: Medicaid Other | Admitting: *Deleted

## 2018-11-27 DIAGNOSIS — O43193 Other malformation of placenta, third trimester: Secondary | ICD-10-CM

## 2018-11-27 DIAGNOSIS — O1413 Severe pre-eclampsia, third trimester: Principal | ICD-10-CM | POA: Diagnosis present

## 2018-11-27 DIAGNOSIS — O365990 Maternal care for other known or suspected poor fetal growth, unspecified trimester, not applicable or unspecified: Secondary | ICD-10-CM | POA: Diagnosis present

## 2018-11-27 DIAGNOSIS — O365930 Maternal care for other known or suspected poor fetal growth, third trimester, not applicable or unspecified: Secondary | ICD-10-CM | POA: Diagnosis present

## 2018-11-27 DIAGNOSIS — O133 Gestational [pregnancy-induced] hypertension without significant proteinuria, third trimester: Secondary | ICD-10-CM | POA: Diagnosis present

## 2018-11-27 DIAGNOSIS — Z20828 Contact with and (suspected) exposure to other viral communicable diseases: Secondary | ICD-10-CM | POA: Diagnosis present

## 2018-11-27 DIAGNOSIS — Z3A30 30 weeks gestation of pregnancy: Secondary | ICD-10-CM

## 2018-11-27 DIAGNOSIS — O99213 Obesity complicating pregnancy, third trimester: Secondary | ICD-10-CM | POA: Diagnosis present

## 2018-11-27 DIAGNOSIS — O289 Unspecified abnormal findings on antenatal screening of mother: Secondary | ICD-10-CM

## 2018-11-27 DIAGNOSIS — O43199 Other malformation of placenta, unspecified trimester: Secondary | ICD-10-CM | POA: Insufficient documentation

## 2018-11-27 DIAGNOSIS — O0990 Supervision of high risk pregnancy, unspecified, unspecified trimester: Secondary | ICD-10-CM

## 2018-11-27 DIAGNOSIS — O365931 Maternal care for other known or suspected poor fetal growth, third trimester, fetus 1: Secondary | ICD-10-CM

## 2018-11-27 LAB — COMPREHENSIVE METABOLIC PANEL
ALT: 15 U/L (ref 0–44)
AST: 16 U/L (ref 15–41)
Albumin: 2.7 g/dL — ABNORMAL LOW (ref 3.5–5.0)
Alkaline Phosphatase: 86 U/L (ref 38–126)
Anion gap: 10 (ref 5–15)
BUN: 8 mg/dL (ref 6–20)
CO2: 23 mmol/L (ref 22–32)
Calcium: 9.2 mg/dL (ref 8.9–10.3)
Chloride: 104 mmol/L (ref 98–111)
Creatinine, Ser: 0.78 mg/dL (ref 0.44–1.00)
GFR calc Af Amer: >60 mL/min (ref >60)
GFR calc non Af Amer: >60 mL/min (ref >60)
Glucose, Bld: 79 mg/dL (ref 70–99)
Potassium: 4.7 mmol/L (ref 3.5–5.1)
Sodium: 137 mmol/L (ref 135–145)
Total Bilirubin: 0.3 mg/dL (ref 0.3–1.2)
Total Protein: 5.7 g/dL — ABNORMAL LOW (ref 6.5–8.1)

## 2018-11-27 LAB — CBC
HCT: 35.2 % — ABNORMAL LOW (ref 36.0–46.0)
Hemoglobin: 11.8 g/dL — ABNORMAL LOW (ref 12.0–15.0)
MCH: 30.3 pg (ref 26.0–34.0)
MCHC: 33.5 g/dL (ref 30.0–36.0)
MCV: 90.3 fL (ref 80.0–100.0)
Platelets: 173 10*3/uL (ref 150–400)
RBC: 3.9 MIL/uL (ref 3.87–5.11)
RDW: 12.6 % (ref 11.5–15.5)
WBC: 10.4 10*3/uL (ref 4.0–10.5)
nRBC: 0 % (ref 0.0–0.2)

## 2018-11-27 LAB — TYPE AND SCREEN
ABO/RH(D): A POS
Antibody Screen: NEGATIVE

## 2018-11-27 LAB — ABO/RH: ABO/RH(D): A POS

## 2018-11-27 LAB — PROTEIN / CREATININE RATIO, URINE
Creatinine, Urine: 95.28 mg/dL
Total Protein, Urine: <6 mg/dL

## 2018-11-27 LAB — SARS CORONAVIRUS 2 BY RT PCR (HOSPITAL ORDER, PERFORMED IN ~~LOC~~ HOSPITAL LAB): SARS Coronavirus 2: NEGATIVE

## 2018-11-27 MED ORDER — SODIUM CHLORIDE 0.9% FLUSH: 3.0000 mL | Freq: Two times a day (BID) | INTRAVENOUS | Status: DC

## 2018-11-27 MED ORDER — SODIUM CHLORIDE 0.9% FLUSH
3.0000 mL | INTRAVENOUS | Status: DC | PRN
  Administered 2018-11-27: 3 mL via INTRAVENOUS
  Filled 2018-11-27: qty 3

## 2018-11-27 MED ORDER — FAMOTIDINE 20 MG PO TABS: 20.0000 mg | ORAL_TABLET | Freq: Every day | ORAL | Status: DC | PRN

## 2018-11-27 MED ORDER — CALCIUM CARBONATE ANTACID 500 MG PO CHEW: 2.0000 | CHEWABLE_TABLET | ORAL | Status: DC | PRN

## 2018-11-27 MED ORDER — CALCIUM CARBONATE ANTACID 500 MG PO CHEW
400.0000 mg | CHEWABLE_TABLET | Freq: Two times a day (BID) | ORAL | Status: DC
  Administered 2018-11-27: 400 mg via ORAL
  Filled 2018-11-27: qty 2

## 2018-11-27 MED ORDER — ZOLPIDEM TARTRATE 5 MG PO TABS
5.0000 mg | ORAL_TABLET | Freq: Every evening | ORAL | Status: DC | PRN
  Administered 2018-11-27: 5 mg via ORAL

## 2018-11-27 MED ORDER — CITALOPRAM HYDROBROMIDE 10 MG PO TABS
5.0000 mg | ORAL_TABLET | Freq: Every day | ORAL | Status: DC
  Administered 2018-11-28: 5 mg via ORAL
  Filled 2018-11-27: qty 1

## 2018-11-27 MED ORDER — PRENATAL MULTIVITAMIN CH
1.0000 | ORAL_TABLET | Freq: Every day | ORAL | Status: DC
  Administered 2018-11-28: 1 via ORAL
  Filled 2018-11-27: qty 1

## 2018-11-27 MED ORDER — BETAMETHASONE SOD PHOS & ACET 6 (3-3) MG/ML IJ SUSP
12.0000 mg | INTRAMUSCULAR | Status: AC
  Administered 2018-11-27 – 2018-11-28 (×2): 12 mg via INTRAMUSCULAR
  Filled 2018-11-27 (×3): qty 2

## 2018-11-27 MED ORDER — SODIUM CHLORIDE 0.9 % IV SOLN: 250.0000 mL | INTRAVENOUS | Status: DC | PRN

## 2018-11-27 MED ORDER — DOCUSATE SODIUM 100 MG PO CAPS: 100.0000 mg | ORAL_CAPSULE | Freq: Two times a day (BID) | ORAL | Status: DC | PRN

## 2018-11-27 MED ORDER — ACETAMINOPHEN 325 MG PO TABS
650.0000 mg | ORAL_TABLET | ORAL | Status: DC | PRN
  Administered 2018-11-27: 650 mg via ORAL
  Filled 2018-11-27: qty 2

## 2018-11-27 NOTE — H&P (Signed)
Obstetrics Admission History & Physical  11/27/2018 - 5:55 PM [facility]: [facility]  Chief Complaint: elevated BPs at ultrasound  History of Present Illness  28 y.o. G3P1011 @ [redacted]w[redacted]d, with the above CC. Pregnancy complicated by: gHTN, BMI 60s, h/o ITP.  Patient dx on 7/12 with gHTN and put on tid labetalol with pre-exisiting fgr prior to this. Pt had elevated bp with ruq discomfort today at her bpp and sent to hospital for eval.  Pt states she took her labetalol this morning prior to bpp and currently denies any s/s of pre-eclampsia.   Review of Systems:  as noted in the History of Present Illness.  PMHx:  Past Medical History:  Diagnosis Date  . ADHD (attention deficit hyperactivity disorder)   . Anxiety   . Clotting disorder ([place])   . Cough   . Depression   . 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 Leg swelling   . Obesity 01/05/2013  . Thrombocytopenia ([place]) 11/25/2011   Consistent with ITP   PSHx:  Past Surgical History:  Procedure Laterality Date  . TONSILECTOMY, ADENOIDECTOMY, BILATERAL MYRINGOTOMY AND TUBES     Medications:  Medications Prior to Admission  Medication Sig Dispense Refill Last Dose  . acyclovir (ZOVIRAX) 400 MG tablet Take 1 tablet by mouth daily as needed.  0   . citalopram (CELEXA) 20 MG tablet Take 10mg  (1/2 tablet) daily x 1 week, then 20mg  (1 tablet) daily thereafter 30 tablet 2   . labetalol (NORMODYNE) 300 MG tablet Take 1 tablet (300 mg total) by mouth 3 (three) times daily. 1 tablet 0   . nitrofurantoin, macrocrystal-monohydrate, (MACROBID) 100 MG capsule Take 1 capsule (100 mg total) by mouth 2 (two) times daily. X 7 days (Patient not taking: Reported on 11/27/2018) 14 capsule 0   . Pediatric Multiple Vit-C-FA (FLINSTONES GUMMIES OMEGA-3 DHA) CHEW Chew 1 tablet by mouth daily.     . Prenatal Vit-Fe Fumarate-FA (MULTIVITAMIN-PRENATAL) 27-0.8 MG TABS tablet Take 1 tablet by mouth daily at 12 noon.     .  promethazine (PHENERGAN) 25 MG tablet Take 1 tablet (25 mg total) by mouth every 6 (six) hours as needed for nausea or vomiting. 30 tablet 0   . traMADol-acetaminophen (ULTRACET) 37.5-325 MG tablet Take 2 tablets by mouth every 12 (twelve) hours.     Marland Kitchen zolpidem (AMBIEN) 5 MG tablet Take 1 tablet (5 mg total) by mouth at bedtime as needed for sleep. 30 tablet 0      Allergies: is allergic to codeine and ondansetron hcl. OBHx:  OB History  Gravida Para Term Preterm AB Living  3 1 1   1 1   SAB TAB Ectopic Multiple Live Births  1       1    # Outcome Date GA Lbr Len/2nd Weight Sex Delivery Anes PTL Lv  3 Current           2 SAB 2019          1 Term 08/03/07 [redacted]w[redacted]d  3118 g F Vag-Spont EPI  LIV    FHx:  Family History  Problem Relation Age of Onset  . Diabetes Maternal Uncle   . Kidney disease Maternal Uncle        renal failure/dialysis  . Heart attack Maternal Grandmother    Soc Hx:  Social History   Socioeconomic History  . Marital status: Single    Spouse name: Not on file  . Number of children: 1  .  Years of education: Not on file  . Highest education level: Not on file  Occupational History  . Occupation: Chartered certified accountant    Comment: [employer]  . Financial resource strain: Not on file  . Food insecurity    Worry: Not on file    Inability: Not on file  . Transportation needs    Medical: Not on file    Non-medical: Not on file  Tobacco Use  . Smoking status: Never Smoker  . Smokeless tobacco: Never Used  Substance and Sexual Activity  . Alcohol use: No  . Drug use: No  . Sexual activity: Yes    Birth control/protection: None  Lifestyle  . Physical activity    Days per week: Not on file    Minutes per session: Not on file  . Stress: Not on file  Relationships  . Social Herbalist on phone: Not on file    Gets together: Not on file    Attends religious service: Not on file    Active member of club or organization: Not on file     Attends meetings of clubs or organizations: Not on file    Relationship status: Not on file  . Intimate partner violence    Fear of current or ex partner: Not on file    Emotionally abused: Not on file    Physically abused: Not on file    Forced sexual activity: Not on file  Other Topics Concern  . Not on file  Social History Narrative  . Not on file    Objective     Current Vital Signs 24h Vital Sign Ranges  T 98.2 F (36.8 C) Temp  Avg: 98.2 F (36.8 C)  Min: 98.2 F (36.8 C)  Max: 98.2 F (36.8 C)  BP (!) 146/80 BP  Min: 145/69  Max: 164/85  HR 85 Pulse  Avg: 92.7  Min: 85  Max: 101  RR (!) 22 Resp  Avg: 22  Min: 22  Max: 22  SaO2 99 % Room Air SpO2  Avg: 97.5 %  Min: 96 %  Max: 99 %       24 Hour I/O Current Shift I/O  Time Ins Outs No intake/output data recorded. No intake/output data recorded.   Patient Vitals for the past 24 hrs:  BP Temp Temp src Pulse Resp SpO2 Height Weight  11/27/18 1949 (!) 142/61 98.2 F (36.8 C) Oral 82 20 98 % - -  11/27/18 1730 - - - - - 99 % - -  11/27/18 1720 - - - - - - 5\' 4"  (1.626 m) -  11/27/18 1704 (!) 146/80 - - 85 (!) 22 - - -  11/27/18 1700 - - - - - 96 % - -  11/27/18 1659 - 98.2 F (36.8 C) Oral - - - - -  11/27/18 1653 - - - - - - 5\' 4"  (1.626 m) (!) 170.1 kg   EFM and Toco: pending  General: Well nourished, well developed female in no acute distress.  Skin:  Warm and dry.  Cardiovascular: S1, S2 normal, no murmur, rub or gallop, regular rate and rhythm Respiratory:  Clear to auscultation bilateral. Normal respiratory effort Abdomen: obese, nttp Neuro/Psych:  Normal mood and affect.   Labs  Pending  Radiology bpp 8/8 with persistent aedf   Assessment & Plan  Pt stable Pregnancy dx c/b being on labetalol. D/w dr Donalee Citrin and recommend stopping labetalol and if has  severe range BPs then then has dx of severe and will Mg. F/u labs. bmz course now  Durene Romans MD Attending Center for [facility])

## 2018-11-28 DIAGNOSIS — O133 Gestational [pregnancy-induced] hypertension without significant proteinuria, third trimester: Secondary | ICD-10-CM

## 2018-11-28 LAB — PROTEIN, URINE, 24 HOUR
Collection Interval-UPROT: 24 hours
Protein, 24H Urine: 126 mg/d — ABNORMAL HIGH (ref 50–100)
Protein, Urine: 14 mg/dL
Urine Total Volume-UPROT: 900 mL

## 2018-11-28 LAB — CREATININE CLEARANCE, URINE, 24 HOUR
Collection Interval-CRCL: 24 hours
Creatinine Clearance: 72 mL/min — ABNORMAL LOW (ref 75–115)
Creatinine, 24H Ur: 806 mg/d (ref 600–1800)
Creatinine, Urine: 89.53 mg/dL
Urine Total Volume-CRCL: 900 mL

## 2018-11-28 MED ORDER — PROMETHAZINE HCL 25 MG PO TABS
25.0000 mg | ORAL_TABLET | Freq: Four times a day (QID) | ORAL | Status: DC | PRN
  Administered 2018-11-28: 25 mg via ORAL
  Filled 2018-11-28: qty 1

## 2018-11-28 NOTE — Progress Notes (Signed)
Pt outside visiting daughter with approval from MD

## 2018-11-28 NOTE — Discharge Instructions (Signed)
Hypertension During Pregnancy °Hypertension is also called high blood pressure. High blood pressure means that the force of your blood moving in your body is too strong. It can cause problems for you and your baby. Different types of high blood pressure can happen during pregnancy. The types are: °· High blood pressure before you got pregnant. This is called chronic hypertension.  This can continue during your pregnancy. Your doctor will want to keep checking your blood pressure. You may need medicine to keep your blood pressure under control while you are pregnant. You will need follow-up visits after you have your baby. °· High blood pressure that goes up during pregnancy when it was normal before. This is called gestational hypertension. It will usually get better after you have your baby, but your doctor will need to watch your blood pressure to make sure that it is getting better. °· Very high blood pressure during pregnancy. This is called preeclampsia. Very high blood pressure is an emergency that needs to be checked and treated right away. °· You may develop very high blood pressure after giving birth. This is called postpartum preeclampsia. This usually occurs within 48 hours after childbirth but may occur up to 6 weeks after giving birth. This is rare. °How does this affect me? °If you have high blood pressure during pregnancy, you have a higher chance of developing high blood pressure: °· As you get older. °· If you get pregnant again. °In some cases, high blood pressure during pregnancy can cause: °· Stroke. °· Heart attack. °· Damage to the kidneys, lungs, or liver. °· Preeclampsia. °· Jerky movements you cannot control (convulsions or seizures). °· Problems with the placenta. °How does this affect my baby? °Your baby may: °· Be born early. °· Not weigh as much as he or she should. °· Not handle labor well, leading to a c-section birth. °What are the risks? °· Having high blood pressure during a past  pregnancy. °· Being overweight. °· Being 35 years old or older. °· Being pregnant for the first time. °· Being pregnant with more than one baby. °· Becoming pregnant using fertility methods, such as IVF. °· Having other problems, such as diabetes, or kidney disease. °· Having family members who have high blood pressure. °What can I do to lower my risk? ° °· Keep a healthy weight. °· Eat a healthy diet. °· Follow what your doctor tells you about treating any medical problems that you had before becoming pregnant. °It is very important to go to all of your doctor visits. Your doctor will check your blood pressure and make sure that your pregnancy is progressing as it should. Treatment should start early if a problem is found. °How is this treated? °Treatment for high blood pressure during pregnancy can differ depending on the type of high blood pressure you have and how serious it is. °· You may need to take blood pressure medicine. °· If you have been taking medicine for your blood pressure, you may need to change the medicine during pregnancy if it is not safe for your baby. °· If your doctor thinks that you could get very high blood pressure, he or she may tell you to take a low-dose aspirin during your pregnancy. °· If you have very high blood pressure, you may need to stay in the hospital so you and your baby can be watched closely. You may also need to take medicine to lower your blood pressure. This medicine may be given by mouth   or through an IV tube. °· In some cases, if your condition gets worse, you may need to have your baby early. °Follow these instructions at home: °Eating and drinking ° °· Drink enough fluid to keep your pee (urine) pale yellow. °· Avoid caffeine. °Lifestyle °· Do not use any products that contain nicotine or tobacco, such as cigarettes, e-cigarettes, and chewing tobacco. If you need help quitting, ask your doctor. °· Do not use alcohol or drugs. °· Avoid stress. °· Rest and get plenty  of sleep. °· Regular exercise can help. Ask your doctor what kinds of exercise are best for you. °General instructions °· Take over-the-counter and prescription medicines only as told by your doctor. °· Keep all prenatal and follow-up visits as told by your doctor. This is important. °Contact a doctor if: °· You have symptoms that your doctor told you to watch for, such as: °? Headaches. °? Nausea. °? Vomiting. °? Belly (abdominal) pain. °? Dizziness. °? Light-headedness. °Get help right away if: °· You have: °? Very bad belly pain that does not get better with treatment. °? A very bad headache that does not get better. °? Vomiting that does not get better. °? Sudden, fast weight gain. °? Sudden swelling in your hands, ankles, or face. °? Bleeding from your vagina. °? Blood in your pee. °? Blurry vision. °? Double vision. °? Shortness of breath. °? Chest pain. °? Weakness on one side of your body. °? Trouble talking. °· Your baby is not moving as much as usual. °Summary °· High blood pressure is also called hypertension. °· High blood pressure means that the force of your blood moving in your body is too strong. °· High blood pressure can cause problems for you and your baby. °· Keep all follow-up visits as told by your doctor. This is important. °This information is not intended to replace advice given to you by your health care provider. Make sure you discuss any questions you have with your health care provider. °Document Released: 05/15/2010 Document Revised: 08/03/2018 Document Reviewed: 05/09/2018 °Elsevier Patient Education © 2020 [employer] Inc[employer] ° °

## 2018-11-28 NOTE — Progress Notes (Signed)
Daily Antepartum Note  Admission Date: 11/27/2018 Current Date: 11/28/2018 10:35 AM  Janice Taylor is a 28 y.o. G3P1011 @ [redacted]w[redacted]d, HD#2, admitted for elevated BPs.  Pregnancy complicated by: Patient Active Problem List   Diagnosis Date Noted  . Marginal insertion of umbilical cord affecting management of mother 11/27/2018  . Gestational hypertension, third trimester 11/27/2018  . Anxiety 11/07/2018  . Gestational HTN, second trimester 11/06/2018  . Abnormal chromosomal and genetic finding on antenatal screening mother 09/26/2018  . Supervision of high risk pregnancy, antepartum 07/05/2018  . Morbid obesity with BMI of 60.0-69.9, adult ([place]) 07/05/2018  . Obesity 01/05/2013  . 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    Overnight/24hr events:  none  Subjective:  No s/s of pre-eclampsia, PTL or decreased FM.   Objective:    Current Vital Signs 24h Vital Sign Ranges  T 98 F (36.7 C) Temp  Avg: 98.2 F (36.8 C)  Min: 98 F (36.7 C)  Max: 98.2 F (36.8 C)  BP (!) 147/79 BP  Min: 138/71  Max: 164/85  HR 92 Pulse  Avg: 88.3  Min: 80  Max: 101  RR 20 Resp  Avg: 20.4  Min: 20  Max: 22  SaO2 100 % Room Air SpO2  Avg: 98.6 %  Min: 96 %  Max: 100 %       24 Hour I/O Current Shift I/O  Time Ins Outs 08/03 0701 - 08/04 0700 In: -  Out: 1250 [Urine:1250] No intake/output data recorded.   Patient Vitals for the past 24 hrs:  BP Temp Temp src Pulse Resp SpO2 Height Weight  11/28/18 0829 (!) 147/79 98 F (36.7 C) Oral 92 20 100 % - -  11/28/18 0630 - - - - - 99 % - -  11/28/18 0629 (!) 142/73 98.1 F (36.7 C) Oral 80 20 - - -  11/27/18 2310 138/71 98.2 F (36.8 C) Oral 86 20 99 % - -  11/27/18 1950 - - - - - 99 % - -  11/27/18 1949 (!) 142/61 98.2 F (36.8 C) Oral 82 20 98 % - -  11/27/18 1730 - - - - - 99 % - -  11/27/18 1720 - - - - - - 5\' 4"  (1.626 m) -  11/27/18 1704 (!) 146/80 - - 85 (!) 22 - - -  11/27/18 1700 - - - - - 96 % - -  11/27/18 1659 - 98.2 F (36.8  C) Oral - - - - -  11/27/18 1653 - - - - - - 5\' 4"  (1.626 m) (!) 170.1 kg   FHTs: 140 baseline, +accels, no decel, mod variability Toco: quiet x 13m  Physical exam: General: Well nourished, well developed female in no acute distress. Abdomen: gravid nttp Cardiovascular: S1, S2 normal, no murmur, rub or gallop, regular rate and rhythm Respiratory: CTAB Extremities: no clubbing, cyanosis or edema Skin: Warm and dry.   Medications: Current Facility-Administered Medications  Medication Dose Route Frequency Provider Last Rate Last Dose  . 0.9 %  sodium chloride infusion  250 mL Intravenous PRN Aletha Halim, MD      . acetaminophen (TYLENOL) tablet 650 mg  650 mg Oral Q4H PRN Aletha Halim, MD   650 mg at 11/27/18 2234  . betamethasone acetate-betamethasone sodium phosphate (CELESTONE) injection 12 mg  12 mg Intramuscular Q24 Hr x 2 Aletha Halim, MD   12 mg at 11/27/18 1826  . calcium carbonate (TUMS - dosed in mg elemental calcium) chewable tablet 400 mg  of elemental calcium  2 tablet Oral Q4H PRN Woodroe Mode, MD      . calcium carbonate (TUMS - dosed in mg elemental calcium) chewable tablet 400 mg of elemental calcium  400 mg of elemental calcium Oral BID WC Woodroe Mode, MD   400 mg of elemental calcium at 11/27/18 1954  . citalopram (CELEXA) tablet 5 mg  5 mg Oral Daily Aletha Halim, MD   5 mg at 11/28/18 1015  . docusate sodium (COLACE) capsule 100 mg  100 mg Oral BID PRN Aletha Halim, MD      . famotidine (PEPCID) tablet 20 mg  20 mg Oral Daily PRN Aletha Halim, MD      . prenatal multivitamin tablet 1 tablet  1 tablet Oral Q1200 Aletha Halim, MD      . promethazine (PHENERGAN) tablet 25 mg  25 mg Oral Q6H PRN Woodroe Mode, MD   25 mg at 11/28/18 0740  . sodium chloride flush (NS) 0.9 % injection 3 mL  3 mL Intravenous Q12H Aletha Halim, MD      . sodium chloride flush (NS) 0.9 % injection 3 mL  3 mL Intravenous PRN Aletha Halim, MD   3 mL at  11/27/18 1955  . zolpidem (AMBIEN) tablet 5 mg  5 mg Oral QHS PRN Aletha Halim, MD   5 mg at 11/27/18 2215    Labs:  Recent Labs  Lab 11/27/18 1750  WBC 10.4  HGB 11.8*  HCT 35.2*  PLT 173    Recent Labs  Lab 11/27/18 1750  NA 137  K 4.7  CL 104  CO2 23  BUN 8  CREATININE 0.78  CALCIUM 9.2  PROT 5.7*  BILITOT 0.3  ALKPHOS 86  ALT 15  AST 16  GLUCOSE 79    PC ratio: negative  Radiology: no new imaging  Assessment & Plan:  Pt doing well *Pregnancy: reactive NSTs. Continue with bid NSTs *gHTN: now 24h s/p last dose of labetalol. 24h urine collection ongoing. If still mild range pressures into tomorrow, then fine for d/c to home with close outpatient follow up and stay off of anti-hypertensives as an outpatient. If needs anti-hypertensives then would have dx of severe and admit to inpatient and delivery at 34wks *Preterm: BMZ #2 @ 1800 today. Mg if for delivery before 32wks *FGR: rpt bpp tomorrow. It looks like she has UA dopplers as well. Will see if she needs that too *PPx: SCDs, OOB ad lib *FEN/GI: SLIV, regular diet *Dispo: possibly tomorrow  Durene Romans. MD Attending Center for [place] [facility])

## 2018-11-28 NOTE — Discharge Summary (Addendum)
Discharge Summary   Admit Date: 11/27/2018 Discharge Date: 11/28/2018 Discharging Service: Antepartum  Primary OBGYN: [facility] Admitting Physician: Aletha Halim, MD  Discharge Physician: Ilda Basset  Referring Provider: MFM  Primary Care Provider: Sharilyn Sites, MD  Admission Diagnoses: *Pregnancy at 30/2 weeks *GHTN with possible superimposed pre-eclampsia *FGR *BMI 60s  Discharge Diagnoses: *Pregnancy at 30/3 *GHTN *FGR *BMI 60s  Consult Orders: None   Surgeries/Procedures Performed: Non stress tests  History and Physical: Obstetrics Admission History & Physical  11/27/2018 - 5:55 PM Primary OBGYN: [facility]  Chief Complaint: elevated BPs at ultrasound  History of Present Illness  28 y.o. Q7H4193 @ [redacted]w[redacted]d, with the above CC. Pregnancy complicated by: gHTN, BMI 60s, h/o ITP.  Patient dx on 7/12 with gHTN and put on tid labetalol with pre-exisiting fgr prior to this. Pt had elevated bp with ruq discomfort today at her bpp and sent to hospital for eval.  Pt states she took her labetalol this morning prior to bpp and currently denies any s/s of pre-eclampsia.   Review of Systems:  as noted in the History of Present Illness.  PMHx:      Past Medical History:  Diagnosis Date  . ADHD (attention deficit hyperactivity disorder)   . Anxiety   . Clotting disorder ([place])   . Cough   . Depression   . 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 Leg swelling   . Obesity 01/05/2013  . Thrombocytopenia ([place]) 11/25/2011   Consistent with ITP   PSHx:       Past Surgical History:  Procedure Laterality Date  . TONSILECTOMY, ADENOIDECTOMY, BILATERAL MYRINGOTOMY AND TUBES     Medications:         Medications Prior to Admission  Medication Sig Dispense Refill Last Dose  . acyclovir (ZOVIRAX) 400 MG tablet Take 1 tablet by mouth daily as needed.  0   . citalopram (CELEXA) 20 MG tablet Take 10mg  (1/2 tablet) daily x 1 week,  then 20mg  (1 tablet) daily thereafter 30 tablet 2   . labetalol (NORMODYNE) 300 MG tablet Take 1 tablet (300 mg total) by mouth 3 (three) times daily. 1 tablet 0   . nitrofurantoin, macrocrystal-monohydrate, (MACROBID) 100 MG capsule Take 1 capsule (100 mg total) by mouth 2 (two) times daily. X 7 days (Patient not taking: Reported on 11/27/2018) 14 capsule 0   . Pediatric Multiple Vit-C-FA (FLINSTONES GUMMIES OMEGA-3 DHA) CHEW Chew 1 tablet by mouth daily.     . Prenatal Vit-Fe Fumarate-FA (MULTIVITAMIN-PRENATAL) 27-0.8 MG TABS tablet Take 1 tablet by mouth daily at 12 noon.     . promethazine (PHENERGAN) 25 MG tablet Take 1 tablet (25 mg total) by mouth every 6 (six) hours as needed for nausea or vomiting. 30 tablet 0   . traMADol-acetaminophen (ULTRACET) 37.5-325 MG tablet Take 2 tablets by mouth every 12 (twelve) hours.     Marland Kitchen zolpidem (AMBIEN) 5 MG tablet Take 1 tablet (5 mg total) by mouth at bedtime as needed for sleep. 30 tablet 0      Allergies: is allergic to codeine and ondansetron hcl. OBHx:                  OB History  Gravida Para Term Preterm AB Living  3 1 1   1 1   SAB TAB Ectopic Multiple Live Births     1       1       # Outcome Date GA Lbr  Len/2nd Weight Sex Delivery Anes PTL Lv  3 Current           2 SAB 2019          1 Term 08/03/07 [redacted]w[redacted]d  3118 g F Vag-Spont EPI  LIV    FHx:  Family History  Problem Relation Age of Onset  . Diabetes Maternal Uncle   . Kidney disease Maternal Uncle        renal failure/dialysis  . Heart attack Maternal Grandmother    Soc Hx:  Social History        Socioeconomic History  . Marital status: Single    Spouse name: Not on file  . Number of children: 1  . Years of education: Not on file  . Highest education level: Not on file  Occupational History  . Occupation: Chartered certified accountant    Comment: [place]  . Financial resource strain: Not on file  . Food insecurity     Worry: Not on file    Inability: Not on file  . Transportation needs    Medical: Not on file    Non-medical: Not on file  Tobacco Use  . Smoking status: Never Smoker  . Smokeless tobacco: Never Used  Substance and Sexual Activity  . Alcohol use: No  . Drug use: No  . Sexual activity: Yes    Birth control/protection: None  Lifestyle  . Physical activity    Days per week: Not on file    Minutes per session: Not on file  . Stress: Not on file  Relationships  . Social Herbalist on phone: Not on file    Gets together: Not on file    Attends religious service: Not on file    Active member of club or organization: Not on file    Attends meetings of clubs or organizations: Not on file    Relationship status: Not on file  . Intimate partner violence    Fear of current or ex partner: Not on file    Emotionally abused: Not on file    Physically abused: Not on file    Forced sexual activity: Not on file  Other Topics Concern  . Not on file  Social History Narrative  . Not on file    Objective     Current Vital Signs 24h Vital Sign Ranges  T 98.2 F (36.8 C) Temp  Avg: 98.2 F (36.8 C)  Min: 98.2 F (36.8 C)  Max: 98.2 F (36.8 C)  BP (!) 146/80 BP  Min: 145/69  Max: 164/85  HR 85 Pulse  Avg: 92.7  Min: 85  Max: 101  RR (!) 22 Resp  Avg: 22  Min: 22  Max: 22  SaO2 99 % Room Air SpO2  Avg: 97.5 %  Min: 96 %  Max: 99 %       24 Hour I/O Current Shift I/O  Time Ins Outs No intake/output data recorded. No intake/output data recorded.   Patient Vitals for the past 24 hrs:  BP Temp Temp src Pulse Resp SpO2 Height Weight  11/27/18 1949 (!) 142/61 98.2 F (36.8 C) Oral 82 20 98 % - -  11/27/18 1730 - - - - - 99 % - -  11/27/18 1720 - - - - - - 5\' 4"  (1.626 m) -  11/27/18 1704 (!) 146/80 - - 85 (!) 22 - - -  11/27/18 1700 - - - - - 96 % - -  11/27/18 1659 - 98.2 F (36.8 C) Oral - - - - -  11/27/18 1653 - - - - - -  5\' 4"  (1.626 m) (!) 170.1 kg   EFM and Toco: pending  General: Well nourished, well developed female in no acute distress.  Skin:  Warm and dry.  Cardiovascular: S1, S2 normal, no murmur, rub or gallop, regular rate and rhythm Respiratory:  Clear to auscultation bilateral. Normal respiratory effort Abdomen: obese, nttp Neuro/Psych:  Normal mood and affect.   Labs  Pending  Radiology bpp 8/8 with persistent aedf   Assessment & Plan  Pt stable Pregnancy dx c/b being on labetalol. D/w dr Donalee Citrin and recommend stopping labetalol and if has severe range BPs then then has dx of severe and will Mg. F/u labs. bmz course now  Durene Romans MD Attending Center for [facility])         Electronically signed by Aletha Halim, MD at 11/27/2018 10:15 PM  Hospital Course: *Pregnancy: fetal status reassuring with reactive NSTs during her hospitalization. Pt received betamethasone course on 8/3 and 8/4 *GHTN: labetalol stopped and patient only had mild range BPs and no s/s of pre-eclampsia. She was discharged to home off of her labetalol. Recommend only starting an antihypertensive if her pressures are severe, which would give her the diagnosis of severe pre-eclampsia and warrant inpatient admit and possible delivery.  *FGR: no issues.   CBC Latest Ref Rng & Units 11/27/2018 11/07/2018 11/05/2018  WBC 4.0 - 10.5 K/uL 10.4 9.3 11.6(H)  Hemoglobin 12.0 - 15.0 g/dL 11.8(L) 12.0 12.1  Hematocrit 36.0 - 46.0 % 35.2(L) 36.8 36.9  Platelets 150 - 400 K/uL 173 167 187   CMP Latest Ref Rng & Units 11/27/2018 11/05/2018 07/24/2018  Glucose 70 - 99 mg/dL 79 96 121(H)  BUN 6 - 20 mg/dL 8 6 9   Creatinine 0.44 - 1.00 mg/dL 0.78 0.69 0.62  Sodium 135 - 145 mmol/L 137 138 136  Potassium 3.5 - 5.1 mmol/L 4.7 3.6 4.0  Chloride 98 - 111 mmol/L 104 106 104  CO2 22 - 32 mmol/L 23 23 24   Calcium 8.9 - 10.3 mg/dL 9.2 8.9 8.4(L)  Total Protein 6.5 - 8.1 g/dL 5.7(L) 6.0(L)  6.5  Total Bilirubin 0.3 - 1.2 mg/dL 0.3 0.4 0.2(L)  Alkaline Phos 38 - 126 U/L 86 77 73  AST 15 - 41 U/L 16 16 12(L)  ALT 0 - 44 U/L 15 16 14   24  hour urine protein: 126  Discharge Exam:    Current Vital Signs 24h Vital Sign Ranges  T 98.4 F (36.9 C) Temp  Avg: 98.2 F (36.8 C)  Min: 98 F (36.7 C)  Max: 98.4 F (36.9 C)  BP 125/67 BP  Min: 125/67  Max: 147/79  HR 86 Pulse  Avg: 85.9  Min: 80  Max: 92  RR 19 Resp  Avg: 20.1  Min: 19  Max: 22  SaO2 99 % Room Air SpO2  Avg: 98.4 %  Min: 96 %  Max: 100 %       24 Hour I/O Current Shift I/O  Time Ins Outs 08/03 0701 - 08/04 0700 In: -  Out: 1250 [Urine:1250] 08/04 0701 - 08/04 1900 In: -  Out: 200 [Urine:200]    Patient Vitals for the past 24 hrs:  BP Temp Temp src Pulse Resp SpO2 Height Weight  11/28/18 1515 125/67 98.4 F (36.9 C) Oral 86 19 99 % - -  11/28/18 1142 134/62 98.3  F (36.8 C) Oral 90 20 97 % - -  11/28/18 0829 (!) 147/79 98 F (36.7 C) Oral 92 20 100 % - -  11/28/18 0630 - - - - - 99 % - -  11/28/18 0629 (!) 142/73 98.1 F (36.7 C) Oral 80 20 - - -  11/27/18 2310 138/71 98.2 F (36.8 C) Oral 86 20 99 % - -  11/27/18 1950 - - - - - 99 % - -  11/27/18 1949 (!) 142/61 98.2 F (36.8 C) Oral 82 20 98 % - -  11/27/18 1730 - - - - - 99 % - -  11/27/18 1720 - - - - - - 5\' 4"  (1.626 m) -  11/27/18 1704 (!) 146/80 - - 85 (!) 22 - - -  11/27/18 1700 - - - - - 96 % - -  11/27/18 1659 - 98.2 F (36.8 C) Oral - - - - -  11/27/18 1653 - - - - - - 5\' 4"  (1.626 m) (!) 170.1 kg   General appearance: Well nourished, well developed female in no acute distress.  Cardiovascular: S1, S2 normal, no murmur, rub or gallop, regular rate and rhythm Respiratory:  Clear to auscultation bilateral. Normal respiratory effort Abdomen: gravid, obese, nttp Neuro/Psych:  Normal mood and affect.  Skin:  Warm and dry.   Discharge Disposition:  Home  Patient Instructions:  Standard   Results Pending at Discharge:   none  Discharge Medications: Allergies as of 11/28/2018      Reactions   Codeine Nausea And Vomiting   Ondansetron Hcl Other (See Comments)   Not effective`      Medication List    STOP taking these medications   labetalol 300 MG tablet Commonly known as: NORMODYNE     TAKE these medications   acyclovir 400 MG tablet Commonly known as: ZOVIRAX Take 1 tablet by mouth daily as needed.   citalopram 20 MG tablet Commonly known as: CELEXA Take 10mg  (1/2 tablet) daily x 1 week, then 20mg  (1 tablet) daily thereafter   Flinstones Gummies Omega-3 DHA Chew Chew 1 tablet by mouth daily.   multivitamin-prenatal 27-0.8 MG Tabs tablet Take 1 tablet by mouth daily at 12 noon.   nitrofurantoin (macrocrystal-monohydrate) 100 MG capsule Commonly known as: MACROBID Take 1 capsule (100 mg total) by mouth 2 (two) times daily. X 7 days   promethazine 25 MG tablet Commonly known as: PHENERGAN Take 1 tablet (25 mg total) by mouth every 6 (six) hours as needed for nausea or vomiting.   traMADol-acetaminophen 37.5-325 MG tablet Commonly known as: ULTRACET Take 2 tablets by mouth every 12 (twelve) hours.   zolpidem 5 MG tablet Commonly known as: AMBIEN Take 1 tablet (5 mg total) by mouth at bedtime as needed for sleep.        Future Appointments  Date Time Provider [place]  11/29/2018  1:00 PM [place] [facility] [place] [facility]-US  11/29/2018  1:00 PM [place] NURSE [place] [facility]-US  11/29/2018  1:00 PM [place] 5 WH-MFCUS MFC-US  11/29/2018  3:00 PM [place] - FTOBGYN [place] [facility]-FTIMG None  11/29/2018  3:45 PM [place] Schanz, CNM [facility]-FT FTOBGYN  12/05/2018  2:00 PM [place] [facility]-US  12/05/2018  2:00 PM [place] 3 WH-MFCUS MFC-US  12/05/2018  2:15 PM [place] NST [place] MFC-US  12/07/2018  2:00 PM [place] NURSE [place] MFC-US  12/07/2018  2:00 PM [place] 3 WH-MFCUS MFC-US    Durene Romans MD Attending Center for  Nurse, mental health [facility] farm manager)

## 2018-11-28 NOTE — Progress Notes (Signed)
Discharge instructions given to patient. Discussed follow up BPP tomorrow, medication change and signs of hypertension.  Patient verbalized understanding.

## 2018-11-29 ENCOUNTER — Ambulatory Visit (HOSPITAL_COMMUNITY): Payer: Medicaid Other

## 2018-11-29 ENCOUNTER — Other Ambulatory Visit (INDEPENDENT_AMBULATORY_CARE_PROVIDER_SITE_OTHER): Payer: Medicaid Other

## 2018-11-29 ENCOUNTER — Encounter (HOSPITAL_COMMUNITY): Payer: Self-pay

## 2018-11-29 ENCOUNTER — Ambulatory Visit (INDEPENDENT_AMBULATORY_CARE_PROVIDER_SITE_OTHER): Payer: Medicaid Other | Admitting: Women's Health

## 2018-11-29 ENCOUNTER — Ambulatory Visit (HOSPITAL_COMMUNITY)
Admission: RE | Admit: 2018-11-29 | Discharge: 2018-11-29 | Disposition: A | Discharge status: Home / Self Care | Payer: Medicaid Other | Source: Ambulatory Visit | Attending: Obstetrics and Gynecology | Admitting: Obstetrics and Gynecology

## 2018-11-29 ENCOUNTER — Other Ambulatory Visit: Payer: Self-pay | Admitting: Women's Health

## 2018-11-29 ENCOUNTER — Encounter: Payer: Self-pay | Admitting: Women's Health

## 2018-11-29 ENCOUNTER — Other Ambulatory Visit: Payer: Self-pay

## 2018-11-29 VITALS — BP 138/81 | HR 84 | Wt 371.0 lb

## 2018-11-29 DIAGNOSIS — O133 Gestational [pregnancy-induced] hypertension without significant proteinuria, third trimester: Secondary | ICD-10-CM

## 2018-11-29 DIAGNOSIS — O285 Abnormal chromosomal and genetic finding on antenatal screening of mother: Secondary | ICD-10-CM

## 2018-11-29 DIAGNOSIS — O0990 Supervision of high risk pregnancy, unspecified, unspecified trimester: Secondary | ICD-10-CM

## 2018-11-29 DIAGNOSIS — O365990 Maternal care for other known or suspected poor fetal growth, unspecified trimester, not applicable or unspecified: Secondary | ICD-10-CM | POA: Diagnosis present

## 2018-11-29 DIAGNOSIS — Z3A30 30 weeks gestation of pregnancy: Secondary | ICD-10-CM

## 2018-11-29 DIAGNOSIS — Z331 Pregnant state, incidental: Secondary | ICD-10-CM

## 2018-11-29 DIAGNOSIS — O365931 Maternal care for other known or suspected poor fetal growth, third trimester, fetus 1: Secondary | ICD-10-CM | POA: Diagnosis not present

## 2018-11-29 DIAGNOSIS — O365930 Maternal care for other known or suspected poor fetal growth, third trimester, not applicable or unspecified: Secondary | ICD-10-CM

## 2018-11-29 DIAGNOSIS — O0993 Supervision of high risk pregnancy, unspecified, third trimester: Secondary | ICD-10-CM

## 2018-11-29 DIAGNOSIS — Z1389 Encounter for screening for other disorder: Secondary | ICD-10-CM

## 2018-11-29 LAB — POCT URINALYSIS DIPSTICK OB
Blood, UA: NEGATIVE
Glucose, UA: NEGATIVE
Ketones, UA: NEGATIVE
Leukocytes, UA: NEGATIVE
Nitrite, UA: NEGATIVE
POC,PROTEIN,UA: NEGATIVE

## 2018-11-29 NOTE — Progress Notes (Signed)
[place] 03+0 wks,cephalic,anterior placenta gr 3,fhr 164 bpm,BPP 8/8,AFI 12 cm,elevated UAD with absent end diastolic flow,RI 1.31,4.38,.88=75.79%,[id number] view because of pt body habitus

## 2018-11-29 NOTE — Progress Notes (Signed)
HIGH-RISK PREGNANCY VISIT Patient name: Janice Taylor MRN [id number]  Date of birth: 1990-06-05 Chief Complaint:   High Risk Gestation (u/s)  History of Present Illness:   Janice Taylor is a 28 y.o. G76P1011 female at [redacted]w[redacted]d with an Estimated Date of Delivery: 02/03/19 being seen today for ongoing management of a high-risk pregnancy complicated by [facility] dx @ 27wks not currently on meds, elevated AFP w/ 1:10 increased r/f OSB, FGR 5%, absent EDF, marginal cord insertion Today she reports no complaints. Just d/c'd yesterday from [facility] after 1d stay for elevated bp on Labetalol 300mg  TID while in MFM for u/s, new dx of severe FGR on 7/30 w/ elevated UAD and persistent AEDF, then was intermittent AEDF on 8/3 w/ MFM. Labetalol was stopped to see if she developed any severe range bp's, and she did not. She received BMZ on 8/3 & 8/4 and was d/c'd to remain off of Labetalol. Denies ha, visual changes, ruq/epigastric pain, n/v.  Contractions: Not present.  .  Movement: Present. denies leaking of fluid.  Review of Systems:   Pertinent items are noted in HPI Denies abnormal vaginal discharge w/ itching/odor/irritation, headaches, visual changes, shortness of breath, chest pain, abdominal pain, severe nausea/vomiting, or problems with urination or bowel movements unless otherwise stated above. Pertinent History Reviewed:  Reviewed past medical,surgical, social, obstetrical and family history.  Reviewed problem list, medications and allergies. Physical Assessment:   Vitals:   11/29/18 1548  BP: 138/81  Pulse: 84  Weight: (!) 371 lb (168.3 kg)  Body mass index is 63.68 kg/m.           Physical Examination:   General appearance: alert, well appearing, and in no distress  Mental status: alert, oriented to person, place, and time  Skin: warm & dry   Extremities: Edema: Mild pitting, slight indentation    Cardiovascular: normal heart rate noted  Respiratory: normal respiratory effort, no distress  Abdomen:  gravid, soft, non-tender  Pelvic: Cervical exam deferred         Fetal Status:     Movement: Present    Fetal Surveillance Testing today: [place] 13+2 wks,cephalic,anterior placenta gr 3,fhr 164 bpm,BPP 8/8,AFI 12 cm,elevated UAD with absent end diastolic flow,RI 4.40,1.02,.72=53.66%,YQIHKVQ view because of pt body habitus   Results for orders placed or performed in visit on 11/29/18 (from the past 24 hour(s))  POC Urinalysis Dipstick OB   Collection Time: 11/29/18  3:47 PM  Result Value Ref Range   Color, UA     Clarity, UA     Glucose, UA Negative Negative   Bilirubin, UA     Ketones, UA neg    Spec Grav, UA     Blood, UA neg    pH, UA     POC,PROTEIN,UA Negative Negative, Trace, Small (1+), Moderate (2+), Large (3+), 4+   Urobilinogen, UA     Nitrite, UA neg    Leukocytes, UA Negative Negative   Appearance     Odor      Assessment & Plan:  1) High-risk pregnancy G3P1011 at [redacted]w[redacted]d with an Estimated Date of Delivery: 02/03/19   2) GHTN, currently off meds, stable, asymptomatic, check home bp QID, reviewed severe range bp's, pre-e s/s, reasons to seek care  3) Severe FGR, 5% new dx 7/30, had been 21% 4 weeks earlier on 6/30  4) Elevated UAD w/ persistent AEDF> reviewed w/ Dr. Rip Harbour, no change in plan of care, s/p BMZ 8/3 & 8/4   5) Elevated AFP w/ 1:10 r/f  OSB> normal detailed u/s  6) Marginal cord insertion  Meds: No orders of the defined types were placed in this encounter.   Labs/procedures today: bpp/dopp u/s  Treatment Plan:  Continue 2x/wk bpp/dopp, deliver prn  Reviewed: Preterm labor symptoms and general obstetric precautions including but not limited to vaginal bleeding, contractions, leaking of fluid and fetal movement were reviewed in detail with the patient.  All questions were answered.   Follow-up: next week as scheduled w/ MFM (no u/s appt available here next week)  Orders Placed This Encounter  Procedures  . POC Urinalysis Dipstick OB   Roma Schanz CNM, [facility] 11/29/2018 4:03 PM   11/30/18 @ 0845: spoke w/ Dr. Elonda Husky who discussed case w/ Dr. Donalee Citrin this am, recommend inpatient care until delivery. Called pt and notified her, she will head to hospital. Notified Dr. Ilda Basset, 2nd call [facility].  Roma Schanz, CNM, WHNP-BC

## 2018-11-30 ENCOUNTER — Other Ambulatory Visit: Payer: Self-pay | Admitting: Obstetrics and Gynecology

## 2018-11-30 ENCOUNTER — Telehealth: Payer: Self-pay | Admitting: Women's Health

## 2018-11-30 NOTE — Telephone Encounter (Signed)
Discussed recommendations from Dr. Donalee Citrin and Dr. Elonda Husky for admission until delivery. Pt will go to [facility] now. Dr. Ilda Basset notified.  Roma Schanz, CNM, WHNP-BC 11/30/2018 9:09 AM

## 2018-12-01 ENCOUNTER — Other Ambulatory Visit: Payer: Self-pay

## 2018-12-01 ENCOUNTER — Inpatient Hospital Stay (HOSPITAL_COMMUNITY)
Admission: AD | Admit: 2018-12-01 | Discharge: 2018-12-04 | DRG: 833 | Disposition: A | Discharge status: Home / Self Care | Payer: Medicaid Other | Attending: Obstetrics & Gynecology | Admitting: Obstetrics & Gynecology

## 2018-12-01 ENCOUNTER — Encounter (HOSPITAL_COMMUNITY): Payer: Self-pay | Admitting: *Deleted

## 2018-12-01 DIAGNOSIS — O365930 Maternal care for other known or suspected poor fetal growth, third trimester, not applicable or unspecified: Principal | ICD-10-CM | POA: Diagnosis present

## 2018-12-01 DIAGNOSIS — O289 Unspecified abnormal findings on antenatal screening of mother: Secondary | ICD-10-CM | POA: Diagnosis not present

## 2018-12-01 DIAGNOSIS — O99343 Other mental disorders complicating pregnancy, third trimester: Secondary | ICD-10-CM | POA: Diagnosis present

## 2018-12-01 DIAGNOSIS — O43199 Other malformation of placenta, unspecified trimester: Secondary | ICD-10-CM | POA: Diagnosis present

## 2018-12-01 DIAGNOSIS — Z20828 Contact with and (suspected) exposure to other viral communicable diseases: Secondary | ICD-10-CM | POA: Diagnosis present

## 2018-12-01 DIAGNOSIS — O99213 Obesity complicating pregnancy, third trimester: Secondary | ICD-10-CM | POA: Diagnosis not present

## 2018-12-01 DIAGNOSIS — F411 Generalized anxiety disorder: Secondary | ICD-10-CM | POA: Diagnosis present

## 2018-12-01 DIAGNOSIS — O133 Gestational [pregnancy-induced] hypertension without significant proteinuria, third trimester: Secondary | ICD-10-CM | POA: Diagnosis present

## 2018-12-01 DIAGNOSIS — O43123 Velamentous insertion of umbilical cord, third trimester: Secondary | ICD-10-CM | POA: Diagnosis present

## 2018-12-01 DIAGNOSIS — O43193 Other malformation of placenta, third trimester: Secondary | ICD-10-CM | POA: Diagnosis not present

## 2018-12-01 DIAGNOSIS — Z3A31 31 weeks gestation of pregnancy: Secondary | ICD-10-CM

## 2018-12-01 DIAGNOSIS — Z8759 Personal history of other complications of pregnancy, childbirth and the puerperium: Secondary | ICD-10-CM | POA: Diagnosis present

## 2018-12-01 DIAGNOSIS — O365990 Maternal care for other known or suspected poor fetal growth, unspecified trimester, not applicable or unspecified: Secondary | ICD-10-CM | POA: Diagnosis not present

## 2018-12-01 HISTORY — DX: Herpesviral infection of urogenital system, unspecified: A60.00

## 2018-12-01 LAB — CBC
HCT: 37.4 % (ref 36.0–46.0)
Hemoglobin: 12.3 g/dL (ref 12.0–15.0)
MCH: 30.1 pg (ref 26.0–34.0)
MCHC: 32.9 g/dL (ref 30.0–36.0)
MCV: 91.7 fL (ref 80.0–100.0)
Platelets: 183 10*3/uL (ref 150–400)
RBC: 4.08 MIL/uL (ref 3.87–5.11)
RDW: 12.5 % (ref 11.5–15.5)
WBC: 12.8 10*3/uL — ABNORMAL HIGH (ref 4.0–10.5)
nRBC: 0.2 % (ref 0.0–0.2)

## 2018-12-01 LAB — COMPREHENSIVE METABOLIC PANEL
ALT: 20 U/L (ref 0–44)
AST: 17 U/L (ref 15–41)
Albumin: 2.9 g/dL — ABNORMAL LOW (ref 3.5–5.0)
Alkaline Phosphatase: 93 U/L (ref 38–126)
Anion gap: 10 (ref 5–15)
BUN: 8 mg/dL (ref 6–20)
CO2: 22 mmol/L (ref 22–32)
Calcium: 8.4 mg/dL — ABNORMAL LOW (ref 8.9–10.3)
Chloride: 102 mmol/L (ref 98–111)
Creatinine, Ser: 0.81 mg/dL (ref 0.44–1.00)
GFR calc Af Amer: >60 mL/min (ref >60)
GFR calc non Af Amer: >60 mL/min (ref >60)
Glucose, Bld: 109 mg/dL — ABNORMAL HIGH (ref 70–99)
Potassium: 3.6 mmol/L (ref 3.5–5.1)
Sodium: 134 mmol/L — ABNORMAL LOW (ref 135–145)
Total Bilirubin: <0.1 mg/dL — ABNORMAL LOW (ref 0.3–1.2)
Total Protein: 5.9 g/dL — ABNORMAL LOW (ref 6.5–8.1)

## 2018-12-01 LAB — SARS CORONAVIRUS 2 BY RT PCR (HOSPITAL ORDER, PERFORMED IN ~~LOC~~ HOSPITAL LAB): SARS Coronavirus 2: NEGATIVE

## 2018-12-01 LAB — TYPE AND SCREEN
ABO/RH(D): A POS
Antibody Screen: NEGATIVE

## 2018-12-01 LAB — PROTEIN / CREATININE RATIO, URINE
Creatinine, Urine: 149.47 mg/dL
Protein Creatinine Ratio: 0.11 mg/mg{Cre} (ref 0.00–0.15)
Total Protein, Urine: 16 mg/dL

## 2018-12-01 MED ORDER — ACETAMINOPHEN 325 MG PO TABS: 650.0000 mg | ORAL_TABLET | ORAL | Status: DC | PRN

## 2018-12-01 MED ORDER — CITALOPRAM HYDROBROMIDE 10 MG PO TABS
10.0000 mg | ORAL_TABLET | Freq: Every day | ORAL | Status: DC
  Administered 2018-12-02: 10 mg via ORAL
  Filled 2018-12-01: qty 1

## 2018-12-01 MED ORDER — DOCUSATE SODIUM 100 MG PO CAPS
100.0000 mg | ORAL_CAPSULE | Freq: Two times a day (BID) | ORAL | Status: DC | PRN
  Administered 2018-12-02 – 2018-12-03 (×2): 100 mg via ORAL
  Filled 2018-12-01 (×2): qty 1

## 2018-12-01 MED ORDER — FAMOTIDINE 20 MG PO TABS
20.0000 mg | ORAL_TABLET | Freq: Every day | ORAL | Status: DC | PRN
  Administered 2018-12-01: 20 mg via ORAL
  Filled 2018-12-01: qty 1

## 2018-12-01 MED ORDER — VALACYCLOVIR HCL 500 MG PO TABS
1000.0000 mg | ORAL_TABLET | Freq: Every day | ORAL | Status: DC
  Administered 2018-12-02 – 2018-12-04 (×3): 1000 mg via ORAL
  Filled 2018-12-01 (×3): qty 2

## 2018-12-01 MED ORDER — ZOLPIDEM TARTRATE 5 MG PO TABS
5.0000 mg | ORAL_TABLET | Freq: Every evening | ORAL | Status: DC | PRN
  Administered 2018-12-01 – 2018-12-03 (×3): 5 mg via ORAL
  Filled 2018-12-01 (×3): qty 1

## 2018-12-01 MED ORDER — ALPRAZOLAM 0.5 MG PO TABS
1.0000 mg | ORAL_TABLET | Freq: Once | ORAL | Status: AC
  Administered 2018-12-01: 1 mg via ORAL
  Filled 2018-12-01: qty 2

## 2018-12-01 MED ORDER — PRENATAL MULTIVITAMIN CH
1.0000 | ORAL_TABLET | Freq: Every day | ORAL | Status: DC
  Administered 2018-12-02 – 2018-12-04 (×3): 1 via ORAL
  Filled 2018-12-01 (×3): qty 1

## 2018-12-01 NOTE — H&P (Signed)
Heleena Miceli is a 28 y.o. single G2P1 female presenting for MFM rec'd inpatient management of intermittent absent end diastoic flow. The plan is delivery at 26 weeks/prn sooner.  She reports good FM, denies vaginal bleeding, ROM, CTXs. OB History    Gravida  3   Para  1   Term  1   Preterm      AB  1   Living  1     SAB  1   TAB      Ectopic      Multiple      Live Births  1          Past Medical History:  Diagnosis Date  . ADHD (attention deficit hyperactivity disorder)   . Anxiety   . Clotting disorder ([place])   . Cough   . Depression   . 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 Leg swelling   . Obesity 01/05/2013  . Thrombocytopenia ([place]) 11/25/2011   Consistent with ITP   Past Surgical History:  Procedure Laterality Date  . TONSILECTOMY, ADENOIDECTOMY, BILATERAL MYRINGOTOMY AND TUBES     Family History: family history includes Diabetes in her maternal uncle; Heart attack in her maternal grandmother; Kidney disease in her maternal uncle. Social History:  reports that she has never smoked. She has never used smokeless tobacco. She reports that she does not drink alcohol or use drugs.     Maternal Diabetes: No Genetic Screening: Maternal Ultrasounds/Referrals: Other: AEDF Fetal Ultrasounds or other Referrals:  None Maternal Substance Abuse:  No Significant Maternal Medications:  None Significant Maternal Lab Results:  None Other Comments:  None  ROS History   Blood pressure (!) 162/93, pulse 87, temperature 98.2 F (36.8 C), temperature source Oral, height 5\' 4"  (1.626 m), last menstrual period 04/29/2018. Exam Physical Exam  Breathing, conversing, and ambulating normally Well nourished, well hydrated White female, no apparent distress Heart- RRR Lungs- CTAB Abd- benign, gravid FHR- reassuring  Prenatal labs: ABO, Rh: --/--/A POS (08/07 1822) Antibody: NEG (08/07 1822) Rubella: <0.90 (03/11 1549) RPR: Non  Reactive (07/14 0958)  HBsAg: Negative (03/11 1549)  HIV: Non Reactive (07/14 0958)  GBS:     Assessment/Plan: Intermittent absent end diastolic flow- per MFM, needs admission until delivery She got BMZ x 2 doses with her last admission last week.  Unwanted fertility- she will sign Medicaid BTL forms this weekend.   Emily Filbert 12/01/2018, 7:19 PM

## 2018-12-02 DIAGNOSIS — F411 Generalized anxiety disorder: Secondary | ICD-10-CM | POA: Diagnosis present

## 2018-12-02 MED ORDER — HYDROXYZINE HCL 10 MG PO TABS
10.0000 mg | ORAL_TABLET | Freq: Three times a day (TID) | ORAL | Status: DC | PRN
  Filled 2018-12-02: qty 1

## 2018-12-02 MED ORDER — CITALOPRAM HYDROBROMIDE 20 MG PO TABS: 20.0000 mg | ORAL_TABLET | Freq: Every day | ORAL | Status: DC

## 2018-12-02 MED ORDER — FAMOTIDINE 20 MG PO TABS
20.0000 mg | ORAL_TABLET | Freq: Every day | ORAL | Status: DC | PRN
  Administered 2018-12-03: 20 mg via ORAL
  Filled 2018-12-02: qty 1

## 2018-12-02 MED ORDER — CITALOPRAM HYDROBROMIDE 10 MG PO TABS
10.0000 mg | ORAL_TABLET | Freq: Every day | ORAL | Status: DC
  Administered 2018-12-03 – 2018-12-04 (×2): 10 mg via ORAL
  Filled 2018-12-02 (×2): qty 1

## 2018-12-02 MED ORDER — HYDROXYZINE HCL 10 MG PO TABS
10.0000 mg | ORAL_TABLET | Freq: Two times a day (BID) | ORAL | Status: DC | PRN
  Administered 2018-12-02 – 2018-12-03 (×2): 10 mg via ORAL
  Filled 2018-12-02 (×3): qty 1

## 2018-12-02 NOTE — Progress Notes (Addendum)
   Subjective: Patient reports tolerating PO, ambulating, voiding. She is very concerned about her anxiety and difficulty sleeping. She reports that prior to the pregnancy she was on xanax daily. She has been taking ambien 10 mg (not 5) every night. She would be happy to see a psychiatrist. She was started on celexa but this has not greatly helped her anxiety. She reports good FM, denies VB, ROM, or CTXs.   Objective: I have reviewed patient's vital signs, medications and labs.  General: alert Resp: clear to auscultation bilaterally Cardio: regular rate and rhythm, S1, S2 normal, no murmur, click, rub or gallop GI: soft, non-tender; bowel sounds normal; no masses,  no organomegaly Extremities: extremities normal, atraumatic, no cyanosis or edema Fetal monitoring: reassuring  Assessment: [redacted] weeks EGA with severe IUGR, CHTN versus pre eclampsia Per Dr. Thomes Dinning discharge summary 3 days ago, Dr. Donalee Citrin rec'd no BP meds in order to determine if she has pre e. If she has pre e, start mag  Plan: 31 weeks, IUGR, CHTN, possible Pre ecl, intermittent AEDF, marginal cord insertion, Continue fetal monitoring Plan delivery by 34 weeks if still with AEDF She had BMZ x 2 August 3 and 4. Consult with psychiatry   LOS: 1 day    Emily Filbert 12/02/2018, 7:27 AM

## 2018-12-02 NOTE — Progress Notes (Signed)
Pt has a difficult body habitus to monitor. I held the [place] on for 20 of the 30 min this shift. Baby is comparatively reactive as previous. Pt had been up to walk and drinking soda 1 hr prior to monitoring. Fetal movement palpated while monitoring.

## 2018-12-02 NOTE — Progress Notes (Signed)
Patient off monitor.  Ambulated outside to visit her daughter.  Discussed need to stay on monitor and pt. States " I will be back in a few minutes.  My baby does better after I move around and walk."

## 2018-12-02 NOTE — Progress Notes (Signed)
CSW acknowledges consult and completed chart review.  CSW follow the recommendations of psychiatry.    Laurey Arrow, MSW, LCSW Clinical Social Work [phone]

## 2018-12-02 NOTE — Consult Note (Signed)
Telepsych Consultation   Reason for Consult:  ''Anaxiety'' Referring Physician:  Dr. Clovia Cuff Location of Patient: OB specialty care Location of Provider: [facility]  Patient Identification: Janice Taylor MRN:  [id number] Principal Diagnosis: Generalized anxiety disorder Diagnosis:  Principal Problem:   Generalized anxiety disorder Active Problems:   Fetal growth retardation, antenatal   Total Time spent with patient: 45 minutes  Subjective:   Janice Taylor is a 28 y.o. female patient admitted due to fetal growth retardation.  HPI:  Thanks for asking [place] to do a psychiatric evaluation on Janice Taylor, a 28 year old female who reports history of Generalized anxiety disorder diagnosed since age 20. Patient is [redacted] weeks pregnant and was admitted to [facility] care due to IUGR. Patient reports that she has been experiencing increased anxiety, apprehension, worries since she was told she will have to stay in the hospital for bed rest for another 3 weeks. She reports being anxious about the out come of this current pregnancy and constantly worry about her daughter who is currently staying with her mother. Patient  has been placed on 10 mg of Citalopram which she claims is not strong enough. She is requesting for Alprazolam was she was receiving from her PCP but was weaned off after she got pregnant. Patient is informed about the possible adverse reactions of anxiety medications on her baby during pregnancy and breastfeeding. She demonstrated understanding and stated that she has no plan to breast feed and willing to take Celexa and Hydroxyzine as needed for anxiety. Patient was advised against taking Alprazolam because the risks outweighs the benefits to which she agreed.  Past Psychiatric History: Generalized anxiety disorder  Risk to Self:   denies Risk to Others:  denies Prior Inpatient Therapy:  none Prior Outpatient Therapy:   PCP  Past Medical History:  Past Medical  History:  Diagnosis Date  . ADHD (attention deficit hyperactivity disorder)   . Anxiety   . Clotting disorder ([place])   . Cough   . Depression   . 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 Leg swelling   . Obesity 01/05/2013  . Thrombocytopenia ([place]) 11/25/2011   Consistent with ITP    Past Surgical History:  Procedure Laterality Date  . TONSILECTOMY, ADENOIDECTOMY, BILATERAL MYRINGOTOMY AND TUBES     Family History:  Family History  Problem Relation Age of Onset  . Diabetes Maternal Uncle   . Kidney disease Maternal Uncle        renal failure/dialysis  . Heart attack Maternal Grandmother    Family Psychiatric  History: Social History:  Social History   Substance and Sexual Activity  Alcohol Use No     Social History   Substance and Sexual Activity  Drug Use No    Social History   Socioeconomic History  . Marital status: Single    Spouse name: Not on file  . Number of children: 1  . Years of education: Not on file  . Highest education level: Not on file  Occupational History  . Occupation: Chartered certified accountant    Comment: [place]  . Financial resource strain: Not on file  . Food insecurity    Worry: Not on file    Inability: Not on file  . Transportation needs    Medical: Not on file    Non-medical: Not on file  Tobacco Use  . Smoking status: Never Smoker  . Smokeless tobacco: Never Used  Substance and Sexual Activity  . Alcohol use: No  . Drug use: No  . Sexual activity: Yes    Birth control/protection: None  Lifestyle  . Physical activity    Days per week: Not on file    Minutes per session: Not on file  . Stress: Not on file  Relationships  . Social Herbalist on phone: Not on file    Gets together: Not on file    Attends religious service: Not on file    Active member of club or organization: Not on file    Attends meetings of clubs or organizations: Not on file    Relationship  status: Not on file  Other Topics Concern  . Not on file  Social History Narrative  . Not on file   Additional Social History:    Allergies:   Allergies  Allergen Reactions  . Codeine Nausea And Vomiting  . Ondansetron Hcl Other (See Comments)    Not effective`    Labs:  Results for orders placed or performed during the hospital encounter of 12/01/18 (from the past 48 hour(s))  CBC on admission     Status: Abnormal   Collection Time: 12/01/18  6:22 PM  Result Value Ref Range   WBC 12.8 (H) 4.0 - 10.5 K/uL   RBC 4.08 3.87 - 5.11 MIL/uL   Hemoglobin 12.3 12.0 - 15.0 g/dL   HCT 37.4 36.0 - 46.0 %   MCV 91.7 80.0 - 100.0 fL   MCH 30.1 26.0 - 34.0 pg   MCHC 32.9 30.0 - 36.0 g/dL   RDW 12.5 11.5 - 15.5 %   Platelets 183 150 - 400 K/uL   nRBC 0.2 0.0 - 0.2 %    Comment: Performed at [facility], [place] [address], [place], [place] [address]  Comprehensive metabolic panel     Status: Abnormal   Collection Time: 12/01/18  6:22 PM  Result Value Ref Range   Sodium 134 (L) 135 - 145 mmol/L   Potassium 3.6 3.5 - 5.1 mmol/L   Chloride 102 98 - 111 mmol/L   CO2 22 22 - 32 mmol/L   Glucose, Bld 109 (H) 70 - 99 mg/dL   BUN 8 6 - 20 mg/dL   Creatinine, Ser 0.81 0.44 - 1.00 mg/dL   Calcium 8.4 (L) 8.9 - 10.3 mg/dL   Total Protein 5.9 (L) 6.5 - 8.1 g/dL   Albumin 2.9 (L) 3.5 - 5.0 g/dL   AST 17 15 - 41 U/L   ALT 20 0 - 44 U/L   Alkaline Phosphatase 93 38 - 126 U/L   Total Bilirubin <0.1 (L) 0.3 - 1.2 mg/dL   GFR calc non Af Amer >60 >60 mL/min   GFR calc Af Amer >60 >60 mL/min   Anion gap 10 5 - 15    Comment: Performed at [facility], [place] [address], [place], [place] [address]  Type and screen     Status: None   Collection Time: 12/01/18  6:22 PM  Result Value Ref Range   ABO/RH(D) A POS    Antibody Screen NEG    Sample Expiration      12/04/2018,2359 Performed at [facility], [place] [address] [place], [place] [address]   Culture, beta strep (group b only)      Status: None (Preliminary result)   Collection Time: 12/01/18  6:37 PM   Specimen: Vaginal/Rectal; Genital  Result Value Ref Range   Specimen  Description VAGINAL/RECTAL    Special Requests NONE    Culture      CULTURE REINCUBATED FOR BETTER GROWTH Performed at [facility], [place] [address], [place], [place] [address]    Report Status PENDING   SARS Coronavirus 2 [facility] order, Performed in [facility] hospital lab) Nasopharyngeal Nasopharyngeal Swab     Status: None   Collection Time: 12/01/18  6:37 PM   Specimen: Nasopharyngeal Swab  Result Value Ref Range   SARS Coronavirus 2 NEGATIVE NEGATIVE    Comment: (NOTE) If result is NEGATIVE SARS-CoV-2 target nucleic acids are NOT DETECTED. The SARS-CoV-2 RNA is generally detectable in upper and lower  respiratory specimens during the acute phase of infection. The lowest  concentration of SARS-CoV-2 viral copies this assay can detect is 250  copies / mL. A negative result does not preclude SARS-CoV-2 infection  and should not be used as the sole basis for treatment or other  patient management decisions.  A negative result may occur with  improper specimen collection / handling, submission of specimen other  than nasopharyngeal swab, presence of viral mutation(s) within the  areas targeted by this assay, and inadequate number of viral copies  (<250 copies / mL). A negative result must be combined with clinical  observations, patient history, and epidemiological information. If result is POSITIVE SARS-CoV-2 target nucleic acids are DETECTED. The SARS-CoV-2 RNA is generally detectable in upper and lower  respiratory specimens dur ing the acute phase of infection.  Positive  results are indicative of active infection with SARS-CoV-2.  Clinical  correlation with patient history and other diagnostic information is  necessary to determine patient infection status.  Positive results do  not rule out bacterial infection or  co-infection with other viruses. If result is PRESUMPTIVE POSTIVE SARS-CoV-2 nucleic acids MAY BE PRESENT.   A presumptive positive result was obtained on the submitted specimen  and confirmed on repeat testing.  While 2019 novel coronavirus  (SARS-CoV-2) nucleic acids may be present in the submitted sample  additional confirmatory testing may be necessary for epidemiological  and / or clinical management purposes  to differentiate between  SARS-CoV-2 and other Sarbecovirus currently known to infect humans.  If clinically indicated additional testing with an alternate test  methodology [phone]) is advised. The SARS-CoV-2 RNA is generally  detectable in upper and lower respiratory sp ecimens during the acute  phase of infection. The expected result is Negative. Fact Sheet for Patients:  StrictlyIdeas.no Fact Sheet for Healthcare Providers: BankingDealers.co.za This test is not yet approved or cleared by the [place] FDA and has been authorized for detection and/or diagnosis of SARS-CoV-2 by FDA under an Emergency Use Authorization (EUA).  This EUA will remain in effect (meaning this test can be used) for the duration of the COVID-19 declaration under Section 564(b)(1) of the Act, 21 U.S.C. section 360bbb-3(b)(1), unless the authorization is terminated or revoked sooner. Performed at [facility], [place] [address] [place], [place] [address]   Protein / creatinine ratio, urine     Status: None   Collection Time: 12/01/18  9:08 PM  Result Value Ref Range   Creatinine, Urine 149.47 mg/dL   Total Protein, Urine 16 mg/dL    Comment: NO NORMAL RANGE ESTABLISHED FOR THIS TEST   Protein Creatinine Ratio 0.11 0.00 - 0.15 mg/mg[Cre]    Comment: Performed at [place] [address] [place],  [address]    Medications:  Current Facility-Administered Medications  Medication Dose  Route Frequency Provider Last Rate Last  Dose  . acetaminophen (TYLENOL) tablet 650 mg  650 mg Oral Q4H PRN Aletha Halim, MD      . Derrill Memo ON 12/03/2018] citalopram (CELEXA) tablet 10 mg  10 mg Oral Daily Akintayo, Mojeed, MD      . docusate sodium (COLACE) capsule 100 mg  100 mg Oral BID PRN Aletha Halim, MD      . famotidine (PEPCID) tablet 20 mg  20 mg Oral Daily PRN Akintayo, Mojeed, MD      . hydrOXYzine (ATARAX/VISTARIL) tablet 10 mg  10 mg Oral BID PRN Akintayo, Mojeed, MD      . prenatal multivitamin tablet 1 tablet  1 tablet Oral Q1200 Aletha Halim, MD   1 tablet at 12/02/18 0928  . valACYclovir (VALTREX) tablet 1,000 mg  1,000 mg Oral Daily Aletha Halim, MD   1,000 mg at 12/02/18 3545  . zolpidem (AMBIEN) tablet 5 mg  5 mg Oral QHS PRN Aletha Halim, MD   5 mg at 12/01/18 2235    Musculoskeletal: Strength & Muscle Tone: not tested Gait & Station: not tested Patient leans: N/A  Psychiatric Specialty Exam: Physical Exam  Psychiatric: Her speech is normal and behavior is normal. Judgment and thought content normal. Her mood appears anxious. Cognition and memory are normal.    Review of Systems  Constitutional: Negative.   HENT: Negative.   Respiratory: Negative.   Cardiovascular: Negative.   Gastrointestinal: Negative.   Musculoskeletal: Negative.   Skin: Negative.   Neurological: Negative.   Endo/Heme/Allergies: Negative.   Psychiatric/Behavioral: The patient is nervous/anxious.     Blood pressure (!) 151/81, pulse 80, temperature 98.3 F (36.8 C), temperature source Oral, resp. rate 18, height 5\' 7"  (1.702 m), weight 79 kg, last menstrual period 04/29/2018, SpO2 99 %.Body mass index is 27.29 kg/m.  General Appearance: Casual  Eye Contact:  Good  Speech:  Clear and Coherent  Volume:  Normal  Mood:  Anxious  Affect:  anxious  Thought Process:  Coherent and Linear  Orientation:  Full (Time, Place, and Person)  Thought Content:  Logical  Suicidal Thoughts:  No  Homicidal Thoughts:  No   Memory:  Immediate;   Good Recent;   Good Remote;   Good  Judgement:  Good  Insight:  Good  Psychomotor Activity:  Normal  Concentration:  Concentration: Good and Attention Span: Good  Recall:  Good  Fund of Knowledge:  Good  Language:  Good  Akathisia:  No  Handed:  Right  AIMS (if indicated):     Assets:  Communication Skills Desire for Improvement  ADL's:  Intact  Cognition:  WNL  Sleep:   fair     Treatment Plan Summary: 28 year old female who is [redacted] weeks pregnant, has history of Anxiety who is verbalizing increased anxiety after she was told she will be on bed rest for another 3 weeks due to IUGR. Patient is requesting for Alprazolam but has agreed to take Hydroxyzine as needed for anxiety.  Recommendations: -Continue Celexa 10 mg daily for anxiety-takes 2-6 weeks to kick in. -Consider Hydroxyzine 10 mg every 12 hrs as needed for anxiety.  -Refer patient to a psychiatrist upon discharge for management of Anxiety.  Disposition: No evidence of imminent risk to self or others at present.   Supportive therapy provided about ongoing stressors. Psychiatric servic signing out. Re-consult as needed  This service was provided via telemedicine using a 2-way, interactive audio and video technology.  Names of  all persons participating in this telemedicine service and their role in this encounter. Name: Janice Taylor Role: Patient  Name: Eusebio Friendly Role: RN  Name: Corena Pilgrim, MD Role: Psychiatrist    Corena Pilgrim, MD 12/02/2018 12:17 PM

## 2018-12-03 DIAGNOSIS — O365930 Maternal care for other known or suspected poor fetal growth, third trimester, not applicable or unspecified: Principal | ICD-10-CM

## 2018-12-03 LAB — CULTURE, BETA STREP (GROUP B ONLY)

## 2018-12-03 LAB — OB RESULTS CONSOLE GBS: GBS: NEGATIVE

## 2018-12-03 NOTE — Progress Notes (Signed)
Due to patients body habitus the fetal heart tone was difficult to trace continuously.  This RN held [place] on for entire tracing.  Movement of baby palpated and heart rate heard at intervals when attempting to trace.

## 2018-12-03 NOTE — Progress Notes (Signed)
Patient ID: Janice Taylor, female   DOB: Aug 06, 1990, 28 y.o.   MRN: [id number] [place]) NOTE  Janice Taylor is a 28 y.o. G3P1011 at [redacted]w[redacted]d by best clinical estimate who is admitted for pre-eclampsia and IUGR.   Fetal presentation is cephalic. Length of Stay:  2  Days  Subjective: Feels well. Denies headache, vision changes, RUQ pain. Patient reports the fetal movement as active. Patient reports uterine contraction  activity as none. Patient reports  vaginal bleeding as none. Patient describes fluid per vagina as None.  Vitals:  Blood pressure 128/73, pulse 80, temperature 98.1 F (36.7 C), temperature source Oral, resp. rate 18, height 5\' 7"  (1.702 m), weight 79 kg, last menstrual period 04/29/2018, SpO2 98 %. Physical Examination:  General appearance - alert, well appearing, and in no distress Chest - normal effort Abdomen - gravid, non-tender Fundal Height:  size equals dates Extremities: Homans sign is negative, no sign of DVT  Membranes:intact  Fetal Monitoring:  Baseline: 150 bpm, Variability: Good {> 6 bpm), Accelerations: Reactive and Decelerations: Absent   Medications:  Scheduled . citalopram  10 mg Oral Daily  . prenatal multivitamin  1 tablet Oral Q1200  . valACYclovir  1,000 mg Oral Daily   I have reviewed the patient's current medications.  ASSESSMENT: Principal Problem:   Generalized anxiety disorder Active Problems:   Fetal growth retardation, antenatal AEDF  PLAN: Continue inpt. Monitoring MFM consult Monday to discuss plan of care, really wants outpt. Monitoring Per psych, switched to Vistaril  Donnamae Jude, MD 12/03/2018,7:53 AM

## 2018-12-04 ENCOUNTER — Encounter (HOSPITAL_COMMUNITY): Payer: Self-pay | Admitting: Obstetrics & Gynecology

## 2018-12-04 ENCOUNTER — Inpatient Hospital Stay (HOSPITAL_BASED_OUTPATIENT_CLINIC_OR_DEPARTMENT_OTHER): Payer: Medicaid Other

## 2018-12-04 DIAGNOSIS — O133 Gestational [pregnancy-induced] hypertension without significant proteinuria, third trimester: Secondary | ICD-10-CM

## 2018-12-04 DIAGNOSIS — Z3A31 31 weeks gestation of pregnancy: Secondary | ICD-10-CM

## 2018-12-04 DIAGNOSIS — O365931 Maternal care for other known or suspected poor fetal growth, third trimester, fetus 1: Secondary | ICD-10-CM

## 2018-12-04 DIAGNOSIS — O365990 Maternal care for other known or suspected poor fetal growth, unspecified trimester, not applicable or unspecified: Secondary | ICD-10-CM

## 2018-12-04 DIAGNOSIS — O43193 Other malformation of placenta, third trimester: Secondary | ICD-10-CM | POA: Diagnosis not present

## 2018-12-04 DIAGNOSIS — O289 Unspecified abnormal findings on antenatal screening of mother: Secondary | ICD-10-CM

## 2018-12-04 DIAGNOSIS — O99213 Obesity complicating pregnancy, third trimester: Secondary | ICD-10-CM

## 2018-12-04 MED ORDER — CYCLOBENZAPRINE HCL 10 MG PO TABS
10.0000 mg | ORAL_TABLET | Freq: Three times a day (TID) | ORAL | Status: DC | PRN
  Administered 2018-12-04: 10 mg via ORAL
  Filled 2018-12-04: qty 1

## 2018-12-04 MED ORDER — FAMOTIDINE 20 MG PO TABS: 20.0000 mg | ORAL_TABLET | Freq: Every day | ORAL | 2 refills | Status: DC | PRN

## 2018-12-04 MED ORDER — DOCUSATE SODIUM 100 MG PO CAPS: 100.0000 mg | ORAL_CAPSULE | Freq: Two times a day (BID) | ORAL | 2 refills | Status: DC | PRN

## 2018-12-04 MED ORDER — HYDROXYZINE HCL 10 MG PO TABS: 10.0000 mg | ORAL_TABLET | Freq: Three times a day (TID) | ORAL | 2 refills | Status: DC | PRN

## 2018-12-04 MED ORDER — CYCLOBENZAPRINE HCL 10 MG PO TABS: 10.0000 mg | ORAL_TABLET | Freq: Three times a day (TID) | ORAL | 2 refills | Status: DC | PRN

## 2018-12-04 MED ORDER — VALACYCLOVIR HCL 1 G PO TABS: 1000.0000 mg | ORAL_TABLET | Freq: Every day | ORAL | 2 refills | Status: DC

## 2018-12-04 MED ORDER — ZOLPIDEM TARTRATE 5 MG PO TABS: 5.0000 mg | ORAL_TABLET | Freq: Every evening | ORAL | 2 refills | Status: DC | PRN

## 2018-12-04 NOTE — Discharge Instructions (Signed)
Intrauterine Growth Restriction  Intrauterine growth restriction (IUGR) is when a baby is not growing normally during pregnancy. A baby with IUGR is smaller than it should be and may weigh less than normal at birth. IUGR can result from a problem with the organ that supplies the unborn baby (fetus) with oxygen and nutrition (placenta). Usually, there is no way to prevent this type of problem. Babies with IUGR are at higher risk for early delivery and needing special (intensive) care after birth. What are the causes? The most common cause of IUGR is a problem with the placenta or umbilical cord that causes the fetus to get less oxygen or nutrition than needed. Other causes include:  The mother eating a very unhealthy diet (poor maternal nutrition).  Exposure to chemicals found in substances such as cigarettes, alcohol, and some drugs.  Some prescription medicines.  Other problems that develop in the womb (congenital birth defects).  Genetic disorders.  Infection.  Carrying more than one baby. What increases the risk? This condition is more likely to affect babies of mothers who:  Are over the age of 6 at the time of delivery.  Are younger than age 77 at the time of delivery.  Have medical conditions such as high blood pressure, preeclampsia, diabetes, heart or kidney disease, systemic lupus erythematosus, or anemia.  Live at a very high altitude during pregnancy.  Have a personal history or family history of: ? IUGR. ? A genetic disorder.  Take medicines during pregnancy that are related to congenital disabilities.  Come into contact with infected cat feces (toxoplasmosis).  Come into contact with chickenpox (varicella) or [place] measles (rubella).  Have or are at risk of getting an infectious disease such as syphilis, HIV, or herpes.  Eat an unhealthy diet during pregnancy.  Weigh less than 100 pounds.  Have had treatments to help her have children (infertility  treatments).  Use tobacco, drugs, or alcohol during pregnancy. What are the signs or symptoms? IUGR does not cause many symptoms. You might notice that your baby does not move or kick very often. Also, your belly may not be as big as expected for the stage of your pregnancy. How is this diagnosed? This condition is diagnosed with physical exams and prenatal exams. You may also have:  Fundal measurements to check the size of your uterus.  An ultrasound done to measure your baby's size compared to the size of other babies at the same stage of development (gestational age). Your health care provider will monitor your baby's growth with ultrasounds throughout pregnancy. You may also have tests to find the cause of IUGR. These may include:  Amniocentesis. This is a procedure that involves passing a needle into the uterus to collect a sample of fluid that surrounds the fetus (amniotic fluid). This may be done to check for signs of infection or congenital defects.  Tests to evaluate blood flow to your baby and placenta. How is this treated? In most cases, the goal of treatment is to treat the cause of IUGR. Your health care providers will monitor your pregnancy closely and help you manage your pregnancy. If your condition is caused by a placenta problem and your baby is not getting enough blood, you may need:  Medicine to start labor and deliver your baby early (induction).  Cesarean delivery, also called a C-section. In this procedure, your baby is delivered through an incision in your abdomen and uterus. Follow these instructions at home: Medicines  Take over-the-counter and prescription medicines  only as told by your health care provider. This includes vitamins and supplements.  Make sure that your health care provider knows about and approves of all the medicines, supplements, vitamins, eye drops, and creams that you use. General instructions  Eat a healthy diet that includes fresh fruits  and vegetables, lean proteins, whole grains, and calcium-rich foods such as milk, yogurt, and dark, leafy greens. Work with your health care provider or a dietitian to make sure that: ? You are getting enough nutrients. ? You are gaining enough weight.  Rest as needed. Try to get at least 8 hours of sleep every night.  Do not drink alcohol or use drugs.  Do not use any products that contain nicotine or tobacco, such as cigarettes and e-cigarettes. If you need help quitting, ask your health care provider.  Keep all follow-up visits as told by your health care provider. This is important. Get help right away if you:  Notice that your baby is moving less than usual or is not moving.  Have contractions that are 5 minutes or less apart, or that increase in frequency, intensity, or length.  Have signs and symptoms of infection, including a fever.  Have vaginal bleeding.  Have increased swelling in your legs, hands, or face.  Have vision changes, including seeing spots or having blurry or double vision.  Have a severe headache that does not go away.  Have sudden, sharp abdominal pain or low back pain.  Have an uncontrolled gush or trickle of fluid from your vagina. Summary  Intrauterine growth restriction (IUGR) is when a baby is not growing normally during pregnancy.  The most common cause of IUGR is a problem with the placenta or umbilical cord that causes the fetus to get less oxygen or nutrition than needed.  This condition is diagnosed with physical and prenatal exams. Your health care provider will monitor your baby's growth with ultrasounds throughout pregnancy.  Make sure that your health care provider knows about and approves of all the medicines, supplements, vitamins, eye drops, and creams that you use. This information is not intended to replace advice given to you by your health care provider. Make sure you discuss any questions you have with your health care  provider. Document Released: 01/20/2008 Document Revised: 03/25/2017 Document Reviewed: 02/10/2017 [employer] Patient Education  2020 [employer].

## 2018-12-04 NOTE — Progress Notes (Signed)
Pt ambulated  Out  Teaching complete

## 2018-12-04 NOTE — Plan of Care (Signed)
Pt ready for d/c

## 2018-12-04 NOTE — Discharge Summary (Signed)
Antenatal Physician Discharge Summary  Patient ID: Janice Taylor MRN: [id number] DOB/AGE: 1990-11-29 28 y.o.  Admit date: 12/01/2018 Discharge date: 12/04/2018  Admission Diagnoses:   1) Umbilical artery dopplers with absent end diastolic flow 2) Severe fetal growth restriction 3) Gestational Hypertension 4) Marginal umbilical cord insertion 5) Generalized anxiety disorder  Discharge Diagnoses:  The same  Prenatal Procedures: NST and ultrasound  Consults: Psychiatry, Maternal Fetal Medicine  Hospital Course:  This is a 28 y.o. G3P1011 with IUP at [redacted]w[redacted]d admitted for the aforementioned diagnosis and concern about noncompliance with visits.  Patient had been getting twice a week BPP/dopplers with MFM at [facility].  She had persistent AEDF on dopplers, reassuring BPP of 8/8 and BP was stable.  She was seen by Psychiatry due to anxiety, and started on medications.  On 12/04/18, she expressed desire to go home to be with her child, feels her anxiety would be better and she will come as scheduled for twice a week BPP/dopplers/BP checks at MFM. Discussed with Dr. Tama High (MFM) and her [facility] provider; they felt that inpatient admission was preferred but feel that patient can go as long as she follows up recommended. She had no signs/symptoms of preterm labor or other maternal-fetal concerns.  She was deemed stable for discharge to home with close outpatient follow up.  Discharge Exam: Temp:  [98 F (36.7 C)-98.7 F (37.1 C)] 98.7 F (37.1 C) (08/10 0857) Pulse Rate:  [77-93] 92 (08/10 0857) Resp:  [18] 18 (08/10 0601) BP: (128-141)/(51-87) 132/68 (08/10 0857) SpO2:  [97 %-100 %] 100 % (08/10 0857) Physical Examination: CONSTITUTIONAL: Well-developed, well-nourished female in no acute distress.  HENT:  Normocephalic, atraumatic, External right and left ear normal. Oropharynx is clear and moist EYES: Conjunctivae and EOM are normal. Pupils are equal, round, and reactive to light. No scleral icterus.   NECK: Normal range of motion, supple, no masses SKIN: Skin is warm and dry. No rash noted. Not diaphoretic. No erythema. No pallor. Star: Alert and oriented to person, place, and time. Normal reflexes, muscle tone coordination. No cranial nerve deficit noted. PSYCHIATRIC: Normal mood and affect. Normal behavior. Normal judgment and thought content. CARDIOVASCULAR: Normal heart rate noted, regular rhythm RESPIRATORY: Effort and breath sounds normal, no problems with respiration noted MUSCULOSKELETAL: Normal range of motion. No edema and no tenderness. 2+ distal pulses. ABDOMEN: Soft, nontender, nondistended, gravid. CERVIX:  Deferred  Fetal monitoring: FHR: 130 bpm, Variability: moderate, Accelerations: Present, Decelerations: Absent  Uterine activity: No contractions  Significant Diagnostic Studies:  Results for orders placed or performed during the hospital encounter of 12/01/18 (from the past 168 hour(s))  CBC on admission   Collection Time: 12/01/18  6:22 PM  Result Value Ref Range   WBC 12.8 (H) 4.0 - 10.5 K/uL   RBC 4.08 3.87 - 5.11 MIL/uL   Hemoglobin 12.3 12.0 - 15.0 g/dL   HCT 37.4 36.0 - 46.0 %   MCV 91.7 80.0 - 100.0 fL   MCH 30.1 26.0 - 34.0 pg   MCHC 32.9 30.0 - 36.0 g/dL   RDW 12.5 11.5 - 15.5 %   Platelets 183 150 - 400 K/uL   nRBC 0.2 0.0 - 0.2 %  Comprehensive metabolic panel   Collection Time: 12/01/18  6:22 PM  Result Value Ref Range   Sodium 134 (L) 135 - 145 mmol/L   Potassium 3.6 3.5 - 5.1 mmol/L   Chloride 102 98 - 111 mmol/L   CO2 22 22 - 32 mmol/L   Glucose, Bld  109 (H) 70 - 99 mg/dL   BUN 8 6 - 20 mg/dL   Creatinine, Ser 0.81 0.44 - 1.00 mg/dL   Calcium 8.4 (L) 8.9 - 10.3 mg/dL   Total Protein 5.9 (L) 6.5 - 8.1 g/dL   Albumin 2.9 (L) 3.5 - 5.0 g/dL   AST 17 15 - 41 U/L   ALT 20 0 - 44 U/L   Alkaline Phosphatase 93 38 - 126 U/L   Total Bilirubin <0.1 (L) 0.3 - 1.2 mg/dL   GFR calc non Af Amer >60 >60 mL/min   GFR calc Af Amer >60 >60 mL/min    Anion gap 10 5 - 15  Type and screen   Collection Time: 12/01/18  6:22 PM  Result Value Ref Range   ABO/RH(D) A POS    Antibody Screen NEG    Sample Expiration      12/04/2018,2359 Performed at [facility], [place] [address] [place], [place] [address]   Culture, beta strep (group b only)   Collection Time: 12/01/18  6:37 PM   Specimen: Vaginal/Rectal; Genital  Result Value Ref Range   Specimen Description VAGINAL/RECTAL    Special Requests NONE    Culture      NO GROUP B STREP (S.AGALACTIAE) ISOLATED Performed at [facility], [place] [address], [place], [place] [address]    Report Status 12/03/2018 FINAL   SARS Coronavirus 2 [facility] order, Performed in [place] hospital lab) Nasopharyngeal Nasopharyngeal Swab   Collection Time: 12/01/18  6:37 PM   Specimen: Nasopharyngeal Swab  Result Value Ref Range   SARS Coronavirus 2 NEGATIVE NEGATIVE  Protein / creatinine ratio, urine   Collection Time: 12/01/18  9:08 PM  Result Value Ref Range   Creatinine, Urine 149.47 mg/dL   Total Protein, Urine 16 mg/dL   Protein Creatinine Ratio 0.11 0.00 - 0.15 mg/mg[Cre]  OB RESULT CONSOLE Group B Strep   Collection Time: 12/03/18 12:00 AM  Result Value Ref Range   GBS Negative   Results for orders placed or performed in visit on 11/29/18 (from the past 168 hour(s))  POC Urinalysis Dipstick OB   Collection Time: 11/29/18  3:47 PM  Result Value Ref Range   Color, UA     Clarity, UA     Glucose, UA Negative Negative   Bilirubin, UA     Ketones, UA neg    Spec Grav, UA     Blood, UA neg    pH, UA     POC,PROTEIN,UA Negative Negative, Trace, Small (1+), Moderate (2+), Large (3+), 4+   Urobilinogen, UA     Nitrite, UA neg    Leukocytes, UA Negative Negative   Appearance     Odor    Results for orders placed or performed during the hospital encounter of 11/27/18 (from the past 168 hour(s))  SARS Coronavirus 2 [facility] order, Performed in [place] hospital lab)  Nasopharyngeal Nasopharyngeal Swab   Collection Time: 11/27/18  4:41 PM   Specimen: Nasopharyngeal Swab  Result Value Ref Range   SARS Coronavirus 2 NEGATIVE NEGATIVE  Creatinine clearance, urine, 24 hour   Collection Time: 11/27/18  5:06 PM  Result Value Ref Range   Urine Total Volume-CRCL 900 mL   Collection Interval-CRCL 24 hours   Creatinine, Urine 89.53 mg/dL   Creatinine, 24H Ur 806 600 - 1,800 mg/day   Creatinine Clearance 72 (L) 75 - 115 mL/min  Protein / creatinine ratio, urine   Collection Time: 11/27/18  5:06 PM  Result Value Ref Range   Creatinine, Urine 95.28 mg/dL   Total Protein, Urine <6 mg/dL   Protein Creatinine Ratio        0.00 - 0.15 mg/mg[Cre]  Protein, urine, 24 hour   Collection Time: 11/27/18  5:06 PM  Result Value Ref Range   Urine Total Volume-UPROT 900 mL   Collection Interval-UPROT 24 hours   Protein, Urine 14 mg/dL   Protein, 24H Urine 126 (H) 50 - 100 mg/day  CBC   Collection Time: 11/27/18  5:50 PM  Result Value Ref Range   WBC 10.4 4.0 - 10.5 K/uL   RBC 3.90 3.87 - 5.11 MIL/uL   Hemoglobin 11.8 (L) 12.0 - 15.0 g/dL   HCT 35.2 (L) 36.0 - 46.0 %   MCV 90.3 80.0 - 100.0 fL   MCH 30.3 26.0 - 34.0 pg   MCHC 33.5 30.0 - 36.0 g/dL   RDW 12.6 11.5 - 15.5 %   Platelets 173 150 - 400 K/uL   nRBC 0.0 0.0 - 0.2 %  Comprehensive metabolic panel   Collection Time: 11/27/18  5:50 PM  Result Value Ref Range   Sodium 137 135 - 145 mmol/L   Potassium 4.7 3.5 - 5.1 mmol/L   Chloride 104 98 - 111 mmol/L   CO2 23 22 - 32 mmol/L   Glucose, Bld 79 70 - 99 mg/dL   BUN 8 6 - 20 mg/dL   Creatinine, Ser 0.78 0.44 - 1.00 mg/dL   Calcium 9.2 8.9 - 10.3 mg/dL   Total Protein 5.7 (L) 6.5 - 8.1 g/dL   Albumin 2.7 (L) 3.5 - 5.0 g/dL   AST 16 15 - 41 U/L   ALT 15 0 - 44 U/L   Alkaline Phosphatase 86 38 - 126 U/L   Total Bilirubin 0.3 0.3 - 1.2 mg/dL   GFR calc non Af Amer >60 >60 mL/min   GFR calc Af Amer >60 >60 mL/min   Anion gap 10 5 - 15  Type and screen    Collection Time: 11/27/18  5:50 PM  Result Value Ref Range   ABO/RH(D) A POS    Antibody Screen NEG    Sample Expiration      11/30/2018,2359 Performed at [facility], [address] [place], [place] [address]   ABO/Rh   Collection Time: 11/27/18  5:50 PM  Result Value Ref Range   ABO/RH(D)      A POS Performed at [place] [address], [place], [place] [address]    US Fetal Bpp W/o Non Stress  Result Date: 11/29/2018 FOLLOW UP SONOGRAM Janice Taylor is in the office for a follow up sonogram for BPP and cord dopplers. She is a 28 y.o. year old G78P1011 with Estimated Date of Delivery: 02/03/19 by LMP now at  [redacted]w[redacted]d weeks gestation. Thus far the pregnancy has been complicated by GHTN,obesity,abnormal chromosomal screening,IUGR. GESTATION: SINGLETON PRESENTATION: cephalic FETAL ACTIVITY:          Heart rate         164          The fetus is active. AMNIOTIC FLUID: The amniotic fluid volume is  normal, 12 cm. PLACENTA LOCALIZATION:  anterior GRADE 3 CERVIX: Limited view ADNEXA: wnl GESTATIONAL AGE AND  BIOMETRICS: Gestational criteria: Estimated Date of Delivery: 02/03/19 by LMP now at [redacted]w[redacted]d Previous Scans:9 BIOPHYSICAL PROFILE:  COMMENTS GROSS BODY MOVEMENT                 2  TONE                2  RESPIRATIONS                2  AMNIOTIC FLUID                2                                                          SCORE:  8/8 (Note: NST was not performed as part of this antepartum testing) DOPPLER FLOW STUDIES: UMBILICAL ARTERY RI RATIOS:   elevated UAD with absent end diastolic flow,RI 4.00,8.67,.61=95.09% ANATOMICAL SURVEY                                                                            COMMENTS     CHOROID PLEXUS yes normal              ORBITS yes normal  NASAL BONE yes normal      FACIAL PROFILE yes normal  4 CHAMBERED HEART yes normal      DIAPHRAGM yes normal   STOMACH yes normal  RENAL REGION yes normal  BLADDER yes normal      3 VESSEL CORD yes normal              GENITALIA   female     SUSPECTED ABNORMALITIES:  no QUALITY OF SCAN: satisfactory TECHNICIAN COMMENTS: [place] 32+6 wks,cephalic,anterior placenta gr 3,fhr 164 bpm,BPP 8/8,AFI 12 cm,elevated UAD with absent end diastolic flow,RI 7.12,4.58,.09=98.33%,ASNKNLZ view because of pt body habitus A copy of this report including all images has been saved and backed up to a second source for retrieval if needed. All measures and details of the anatomical scan, placentation, fluid volume and pelvic anatomy are contained in that report. Amber Heide Guile 11/29/2018 3:47 PM Clinical Impression and recommendations: I have reviewed the sonogram results above, combined with the patient's current clinical course, below are my impressions and any appropriate recommendations for management based on the sonographic findings. 1.  [id number] Estimated Date of Delivery: 02/03/19 by serial sonographic evaluations 2.  Fetal sonographic surveillance findings: a). Normal fluid volume b). Normal antepartum fetal assessment with BPP 8/8 c). Elevated fetal Doppler ratio, >99%, with consistently absent EDF 3.  Normal general sonographic findings Will discuss patient with Dr Donalee Citrin regarding management plan going forward, given the absent EDF, no reverse flow, Has received betamethasone Florian Buff 11/29/2018 9:03 PM   [place] Ua Cord Doppler  Result Date: 11/29/2018 FOLLOW UP SONOGRAM Janice Taylor is in the office for a follow up sonogram for BPP and cord dopplers. She is a 28 y.o. year old G67P1011 with Estimated Date of Delivery: 02/03/19 by LMP now at  [redacted]w[redacted]d weeks gestation. Thus far the pregnancy has been complicated by GHTN,obesity,abnormal chromosomal screening,IUGR. GESTATION: SINGLETON PRESENTATION: cephalic FETAL ACTIVITY:  Heart rate         164          The fetus is active. AMNIOTIC FLUID: The amniotic fluid volume is  normal, 12 cm.  PLACENTA LOCALIZATION:  anterior GRADE 3 CERVIX: Limited view ADNEXA: wnl GESTATIONAL AGE AND  BIOMETRICS: Gestational criteria: Estimated Date of Delivery: 02/03/19 by LMP now at [redacted]w[redacted]d Previous Scans:9 BIOPHYSICAL PROFILE:                                                                                                      [place]                 2  TONE                2  RESPIRATIONS                2  AMNIOTIC FLUID                2                                                          SCORE:  8/8 (Note: NST was not performed as part of this antepartum testing) DOPPLER FLOW STUDIES: UMBILICAL ARTERY RI RATIOS:   elevated UAD with absent end diastolic flow,RI 7.78,2.42,.35=36.14% ANATOMICAL SURVEY                                                                            COMMENTS     CHOROID PLEXUS yes normal              ORBITS yes normal  NASAL BONE yes normal      FACIAL PROFILE yes normal  4 CHAMBERED HEART yes normal      DIAPHRAGM yes normal  STOMACH yes normal  RENAL REGION yes normal  BLADDER yes normal      3 VESSEL CORD yes normal              GENITALIA   female     SUSPECTED ABNORMALITIES:  no QUALITY OF SCAN: satisfactory TECHNICIAN COMMENTS: [place] 43+1 wks,cephalic,anterior placenta gr 3,fhr 164 bpm,BPP 8/8,AFI 12 cm,elevated UAD with absent end diastolic flow,RI 5.40,0.86,.76=19.50%,DTOIZTI view because of pt body habitus A copy of this report including all images has been saved and backed up to a second source for retrieval if needed. All measures and details of the anatomical scan, placentation, fluid volume and pelvic anatomy are contained in that report. Amber Heide Guile 11/29/2018 3:47 PM Clinical Impression and recommendations: I have reviewed the sonogram results above, combined  with the patient's current clinical course, below are my impressions and any appropriate recommendations for management based on the sonographic findings. 1.  [id number] Estimated Date of Delivery: 02/03/19 by  serial sonographic evaluations 2.  Fetal sonographic surveillance findings: a). Normal fluid volume b). Normal antepartum fetal assessment with BPP 8/8 c). Elevated fetal Doppler ratio, >99%, with consistently absent EDF 3.  Normal general sonographic findings Will discuss patient with Dr Donalee Citrin regarding management plan going forward, given the absent EDF, no reverse flow, Has received betamethasone Mertie Clause Eure 11/29/2018 9:03 PM   [place] Mfm Fetal Bpp Wo Non Stress  Result Date: 12/04/2018 ----------------------------------------------------------------------  OBSTETRICS REPORT                       (Signed Final 12/04/2018 09:04 am) ---------------------------------------------------------------------- Patient Info  ID #:       [id number]                          D.O.B.:  1990/10/23 (28 yrs)  Name:       Janice Taylor                  Visit Date: 12/04/2018 07:13 am ---------------------------------------------------------------------- Performed By  Performed By:     Enriqueta Shutter           Ref. Address:     [facility]                    RDMS, RVT                                                             OB/GYN                                                             [place]                                                             [place]  Attending:        Tama High MD        [facility]  Referred By:      Roma Schanz CNM ---------------------------------------------------------------------- Orders   #  Description                          Code         Ordered By   1  [place] MFM UA CORD DOPPLER               76820.02     Deville   2  [place] MFM FETAL BPP WO NON              76819.01     CHARLIE PICKENS      STRESS  ----------------------------------------------------------------------   #  Order #                     Accession #                 Episode #   1  [id number]                  [id number]                  [id number]   2  [id number]                  [id number]                  [id number]  ---------------------------------------------------------------------- Indications   Encounter for other antenatal screening        Z36.2   follow-up (Positive Integrated for OSB 1:10)   Maternal morbid obesity                        O99.210 E66.01   Abnormal biochemical screen for spina bifida   O28.9   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   management of mother in second trimester   (fundal)   Gestational hypertension without significant   O13.3   proteinuria, third trimester   Maternal care for known or suspected poor      O36.5931   fetal growth, third trimester, fetus 1   [redacted] weeks gestation of pregnancy                Z3A.31  ---------------------------------------------------------------------- Vital Signs                                                 Height:        5'4" ---------------------------------------------------------------------- Fetal Evaluation  Num Of Fetuses:         1  Fetal Heart Rate(bpm):  135  Cardiac Activity:       Observed  Presentation:           Cephalic  Amniotic Fluid  AFI FV:      Within normal limits  AFI Sum(cm)     %Tile       Largest Pocket(cm)  16.07           58          7.23  RUQ(cm)       RLQ(cm)       LUQ(cm)  LLQ(cm)  7.23          1.73          4.69           2.41 ---------------------------------------------------------------------- Biophysical Evaluation  Amniotic F.V:   Pocket => 2 cm two         F. Tone:        Observed                  planes  F. Movement:    Observed                   Score:          8/8  F. Breathing:   Observed ---------------------------------------------------------------------- OB History  Gravidity:    3         Term:   1        Prem:   0        SAB:   1  TOP:          0       Ectopic:  0        Living: 1  ---------------------------------------------------------------------- Gestational Age  LMP:           31w 2d        Date:  04/29/18                 EDD:   02/03/19  Best:          Burke Keels 2d     Det. By:  LMP  (04/29/18)          EDD:   02/03/19 ---------------------------------------------------------------------- Doppler - Fetal Vessels  Umbilical Artery                                                            ADFV    RDFV                                                              Yes      No ---------------------------------------------------------------------- Impression  Fetal growth restriction. Her pregnancy was dated by 105-  week ultrasound.  Patient was admitted with fetal growth restriction with  abnormal Doppler studies. She has gestational hypertension.  Amniotic fluid is normal and good fetal activity is seen.  Antenatal testing is reassuring. BPP 8/8.  Umbilical artery Doppler study showed persistent absent-end-  diastolic flow. ---------------------------------------------------------------------- Recommendations  -Continue twice-weekly BPP and UA Doppler studies.  -Delivery decision to be dictated by fetal and maternal status.  -Provided antenatal testing remains reassuring (stable),  patient should be delivered at 34 weeks' gestation (absent  end-diastolic flow on the UA Doppler). ----------------------------------------------------------------------                  Tama High, MD Electronically Signed Final Report   12/04/2018 09:04 am ----------------------------------------------------------------------  [place] Mfm Fetal Bpp Wo Non Stress  Result Date: 11/27/2018 ----------------------------------------------------------------------  OBSTETRICS REPORT                       (Signed Final 11/27/2018  01:22 pm) ---------------------------------------------------------------------- Patient Info  ID #:       [id number]                          D.O.B.:  06/15/1990 (28 yrs)  Name:       Janice Taylor                   Visit Date: 11/27/2018 12:21 pm ---------------------------------------------------------------------- Performed By  Performed By:     Valda Favia          Ref. Address:     [facility]                    RDMS                                                             OB/GYN                                                             [place]                                                             [place]  Attending:        Tama High MD        [facility]  Referred By:      Roma Schanz CNM ---------------------------------------------------------------------- Orders   #  Description                          Code         Ordered By   1  [place] MFM FETAL BPP WO NON              12878.67     RAVI SHANKAR      STRESS   2  [place] MFM UA CORD DOPPLER  09381.82     RAVI SHANKAR  ----------------------------------------------------------------------   #  Order #                    Accession #                 Episode #   1  [id number]                  [id number]                  [id number]   2  [id number]                  [id number]                  [id number]  ---------------------------------------------------------------------- Indications   [redacted] weeks gestation of pregnancy                Z3A.30   Encounter for other antenatal screening        Z36.2   follow-up (Positive Integrated for OSB 1:10)   Maternal morbid obesity                        O99.210 E66.01   Abnormal biochemical screen for spina bifida   O28.9   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   management of mother in second trimester   (fundal)   Gestational hypertension without significant   O13.3   proteinuria, third trimester   Maternal care for known or suspected poor      O36.5931   fetal growth, third trimester, fetus 1   ---------------------------------------------------------------------- Vital Signs                                                 Height:        5'4" ---------------------------------------------------------------------- Fetal Evaluation  Num Of Fetuses:         1  Fetal Heart Rate(bpm):  129  Cardiac Activity:       Observed  Presentation:           Breech  Amniotic Fluid  AFI FV:      Within normal limits  AFI Sum(cm)     %Tile       Largest Pocket(cm)  13.5            42          6.61  RUQ(cm)       RLQ(cm)       LUQ(cm)        LLQ(cm)  2.86          6.61          0              4.03 ---------------------------------------------------------------------- Biophysical Evaluation  Amniotic F.V:   Within normal limits       F. Tone:        Observed  F. Movement:    Observed                   Score:          8/8  F. Breathing:   Observed ---------------------------------------------------------------------- OB History  Gravidity:    3         Term:   1  Prem:   0        SAB:   1  TOP:          0       Ectopic:  0        Living: 1 ---------------------------------------------------------------------- Gestational Age  LMP:           30w 2d        Date:  04/29/18                 EDD:   02/03/19  Best:          Weston Settle 2d     Det. By:  LMP  (04/29/18)          EDD:   02/03/19 ---------------------------------------------------------------------- Doppler - Fetal Vessels  Umbilical Artery   S/D     %tile     RI              PI                     ADFV    RDFV  9.03    > 97.5  0.89             2.01                         Y       N ---------------------------------------------------------------------- Impression  Ms. Crisco returned for antenatal testing. She has  gestational hypertension and fetal growth restriction. We  made appointments for her to come twice-weekly for  antenatal testing.  She does not have symptoms of severe headache or visual  disturbances or vaginal bleeding. She c/o intermittent right  upper  quadrant pain.  BP at our office: 145/69 and 164/85 mm Hg.  On ultrasound, amniotic fluid is normal and good fetal activity  is seen. Umbilical artery Doppler showed intermittent absent  end-diastolic flow. BPP 8/8.  Breech presentation.  I recommended evaluation at the MAU because of her  symptoms and hypertension.  Patient will be going over to the MAU. Discussed with Dr.  Roselie Awkward. ---------------------------------------------------------------------- Recommendations  -MAU evaluation.  -Betamethasone to be given.  -Patient has follow-up appointments. ----------------------------------------------------------------------                  Tama High, MD Electronically Signed Final Report   11/27/2018 01:22 pm ----------------------------------------------------------------------  [place] Mfm Fetal Bpp Wo Non Stress  Result Date: 11/23/2018 ----------------------------------------------------------------------  OBSTETRICS REPORT                       (Signed Final 11/23/2018 05:01 pm) ---------------------------------------------------------------------- Patient Info  ID #:       [id number]                          D.O.B.:  December 20, 1990 (28 yrs)  Name:       Janice Taylor                  Visit Date: 11/23/2018 03:28 pm ---------------------------------------------------------------------- Performed By  Performed By:     Ellin Saba          Ref. Address:     [facility]                    RDMS  OB/GYN                                                             [place]  Attending:        Tama High MD        [facility]  Referred By:      Roma Schanz CNM  ---------------------------------------------------------------------- Orders   #  Description                          Code         Ordered By   1  [place] MFM OB FOLLOW UP                  76816.01     RAVI SHANKAR   2  [place] MFM FETAL BPP WO NON              76819.01     KIMBERLY BOOKER      STRESS   3  [place] MFM UA CORD DOPPLER               76820.02     [facility] BOOKER  ----------------------------------------------------------------------   #  Order #                    Accession #                 Episode #   1  [id number]                  [id number]   2  [id number]                  [id number]                  [id number]   3  [id number]  [id number]                  [id number]  ---------------------------------------------------------------------- Indications   [redacted] weeks gestation of pregnancy                Z3A.29   Encounter for other antenatal screening        Z36.2   follow-up (Positive Integrated for OSB 1:10)   Maternal morbid obesity                        O99.210 E66.01   Abnormal biochemical screen for spina bifida   O28.9   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   management of mother in second trimester   (fundal)   Gestational hypertension without significant   O13.3   proteinuria, third trimester   Maternal care for known or suspected poor      O36.5931   fetal growth, third trimester, fetus 1  ---------------------------------------------------------------------- Vital Signs  Weight (lb): 363                               Height:        5'4"  BMI:         62.3 ---------------------------------------------------------------------- Fetal Evaluation  Num Of Fetuses:         1  Fetal Heart Rate(bpm):  142  Cardiac Activity:       Observed  Presentation:           Cephalic  Placenta:               Anterior  P. Cord Insertion:      Marginal insertion  Amniotic Fluid  AFI FV:      Within normal limits  AFI Sum(cm)     %Tile       Largest Pocket(cm)  14.03            46          4.65  RUQ(cm)       RLQ(cm)       LUQ(cm)        LLQ(cm)  1.29          4.24          4.65           3.85 ---------------------------------------------------------------------- Biophysical Evaluation  Amniotic F.V:   Within normal limits       F. Tone:        Observed  F. Movement:    Observed                   Score:          8/8  F. Breathing:   Observed ---------------------------------------------------------------------- Biometry  BPD:      73.1  mm     G. Age:  29w 2d         26  %    CI:        76.85   %    70 - 86                                                          FL/HC:      18.7   %  19.2 - 21.4  HC:      264.1  mm     G. Age:  28w 5d          4  %    HC/AC:      1.07        0.99 - 1.21  AC:      247.6  mm     G. Age:  29w 0d         23  %    FL/BPD:     67.7   %    71 - 87  FL:       49.5  mm     G. Age:  26w 5d        < 1  %    FL/AC:      20.0   %    20 - 24  HUM:      46.1  mm     G. Age:  27w 1d        < 5  %  LV:          5  mm  ULN:      48.2  mm     G. Age:  30w 5d         55  %  TIB:      43.8  mm     G. Age:  27w 0d        < 5  %  RAD:      40.1  mm     G. Age:  28w 0d         36  %  FIB:      44.2  mm     G. Age:  27w 1d         37  %  Est. FW:    1194  gm    2 lb 10 oz       5  % ---------------------------------------------------------------------- OB History  Gravidity:    3         Term:   1        Prem:   0        SAB:   1  TOP:          0       Ectopic:  0        Living: 1 ---------------------------------------------------------------------- Gestational Age  LMP:           29w 5d        Date:  04/29/18                 EDD:   02/03/19  U/S Today:     28w 3d                                        EDD:   02/12/19  Best:          29w 5d     Det. By:  LMP  (04/29/18)          EDD:   02/03/19 ---------------------------------------------------------------------- Anatomy  Cranium:               Appears normal         LVOT:                   Not well visualized  Cavum:                  Appears normal         Aortic Arch:            Not well visualized  Ventricles:            Appears normal         Ductal Arch:            Not well visualized  Choroid Plexus:        Appears normal         Diaphragm:              Appears normal  Cerebellum:            Appears normal         Stomach:                Appears normal, left                                                                        sided  Posterior Fossa:       Appears normal         Abdomen:                Previously seen  Nuchal Fold:           Not applicable (>62    Abdominal Wall:         Previously seen                         wks GA)  Face:                  Not well visualized    Cord Vessels:           Previously seen  Lips:                  Appears normal         Kidneys:                Previously seen  Palate:                Not well visualized    Bladder:                Appears normal  Thoracic:              Appears normal         Spine:                  Not well visualized  Heart:                 Not well visualized    Upper Extremities:      Previously seen  RVOT:                  Not well visualized    Lower Extremities:      Previously seen  Other:  Technically difficult due to maternal habitus and fetal position. ---------------------------------------------------------------------- Doppler - Fetal Vessels  Umbilical Artery  ADFV    RDFV                                                              Yes      No ---------------------------------------------------------------------- Cervix Uterus Adnexa  Cervix  Not visualized (advanced GA >24wks)  Left Ovary  Not visualized.  Right Ovary  Not visualized.  Adnexa  No abnormality visualized. ---------------------------------------------------------------------- Impression  Ms. Wakeley returned for fetal growth assessment.  High-risk problems include: 1) increased maternal serum AFP  (20.53 MoM) on mid-trimester  screening, 2) marginal cord  insertion, 3) recent diagnosis of gestational hypertension.  Patient feels well without symptoms of severe features of  preeclampsia.  On ultrasound, the estimated fetal weight is at the 5th  percentile. Head circumference measurement is at between  mean and -1 SD (normal). Amniotic fluid is normal and good  fetal activity is seen. BPP 8/8.  Umbilical artery Doppler showed persistent absent-end-  diastolic flow. Marginal cord insertion is seen again.  I counseled the patient on the following:  -Explained the diagnosis of severe fetal growth restriction,  fetal weight percentile, and abnormal umbilical artery Doppler  study. Placental insufficiency is the most-likely cause.  Increased MSAFP seen in the second trimester is probably a  reflection of placental insufficiency. Gestational hypertension  is also from placental cause.  -Frequent monitoring is recommended (twice weekly) till  delivery.  -Discussed the benefit of antenatal corticosteroids.  -Delivery at 34 weeks would be recommended if UA Doppler  shows persistent AEDF or earlier if reversed-flow is seen.  We made appointments to have twice-weekly anten[facility]  [facility]. If BPP and  Doppler studies can be performed at your office, we would  recommend once-weekly testing here at the [facility] to avoid  patient inconvenience. ---------------------------------------------------------------------- Recommendations  -BPP, UA Doppler twice weekly.  -NST once weekly  -Betamethasone on 2 consecutive days (patient will decide  on the dates). ----------------------------------------------------------------------                  Tama High, MD Electronically Signed Final Report   11/23/2018 05:01 pm ----------------------------------------------------------------------  [place] Mfm Ob Follow Up  Result Date: 11/23/2018 ----------------------------------------------------------------------  OBSTETRICS REPORT                        (Signed Final 11/23/2018 05:01 pm) ---------------------------------------------------------------------- Patient Info  ID #:       [id number]                          D.O.B.:  07-10-1990 (28 yrs)  Name:       Janice Taylor                  Visit Date: 11/23/2018 03:28 pm ---------------------------------------------------------------------- Performed By  Performed By:     Ellin Saba          Ref. Address:     [facility]                    RDMS  OB/GYN                                                             [place]  Attending:        Tama High MD        [facility]  Referred By:      Roma Schanz CNM ---------------------------------------------------------------------- Orders   #  Description                          Code         Ordered By   1  [place] MFM OB FOLLOW UP                  76816.01     RAVI SHANKAR   2  [place] MFM FETAL BPP WO NON              76819.01     KIMBERLY BOOKER      STRESS   3  [place] MFM UA CORD DOPPLER               76820.02     [facility] BOOKER  ----------------------------------------------------------------------   #  Order #                    Accession #                 Episode #   1  [id number]                  [id number]                  [id number]   2  [id number]                  [id number]                  [id number]   3  [id number]  [id number]                  [id number]  ---------------------------------------------------------------------- Indications   [redacted] weeks gestation of pregnancy                Z3A.29   Encounter for other antenatal screening        Z36.2   follow-up (Positive Integrated for OSB 1:10)   Maternal morbid obesity                         O99.210 E66.01   Abnormal biochemical screen for spina bifida   O28.9   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   management of mother in second trimester   (fundal)   Gestational hypertension without significant   O13.3   proteinuria, third trimester   Maternal care for known or suspected poor      O36.5931   fetal growth, third trimester, fetus 1  ---------------------------------------------------------------------- Vital Signs  Weight (lb): 363                               Height:        5'4"  BMI:         62.3 ---------------------------------------------------------------------- Fetal Evaluation  Num Of Fetuses:         1  Fetal Heart Rate(bpm):  142  Cardiac Activity:       Observed  Presentation:           Cephalic  Placenta:               Anterior  P. Cord Insertion:      Marginal insertion  Amniotic Fluid  AFI FV:      Within normal limits  AFI Sum(cm)     %Tile       Largest Pocket(cm)  14.03           46          4.65  RUQ(cm)       RLQ(cm)       LUQ(cm)        LLQ(cm)  1.29          4.24          4.65           3.85 ---------------------------------------------------------------------- Biophysical Evaluation  Amniotic F.V:   Within normal limits       F. Tone:        Observed  F. Movement:    Observed                   Score:          8/8  F. Breathing:   Observed ---------------------------------------------------------------------- Biometry  BPD:      73.1  mm     G. Age:  29w 2d         26  %    CI:        76.85   %    70 - 86                                                          FL/HC:      18.7   %  19.2 - 21.4  HC:      264.1  mm     G. Age:  28w 5d          4  %    HC/AC:      1.07        0.99 - 1.21  AC:      247.6  mm     G. Age:  29w 0d         23  %    FL/BPD:     67.7   %    71 - 87  FL:       49.5  mm     G. Age:  26w 5d        < 1  %    FL/AC:      20.0   %    20 - 24  HUM:      46.1  mm     G. Age:  27w 1d        < 5  %  LV:          5  mm  ULN:       48.2  mm     G. Age:  30w 5d         55  %  TIB:      43.8  mm     G. Age:  27w 0d        < 5  %  RAD:      40.1  mm     G. Age:  28w 0d         36  %  FIB:      44.2  mm     G. Age:  27w 1d         37  %  Est. FW:    1194  gm    2 lb 10 oz       5  % ---------------------------------------------------------------------- OB History  Gravidity:    3         Term:   1        Prem:   0        SAB:   1  TOP:          0       Ectopic:  0        Living: 1 ---------------------------------------------------------------------- Gestational Age  LMP:           29w 5d        Date:  04/29/18                 EDD:   02/03/19  U/S Today:     28w 3d                                        EDD:   02/12/19  Best:          29w 5d     Det. By:  LMP  (04/29/18)          EDD:   02/03/19 ---------------------------------------------------------------------- Anatomy  Cranium:               Appears normal         LVOT:                   Not well visualized  Cavum:                 Appears normal         Aortic Arch:            Not well visualized  Ventricles:            Appears normal         Ductal Arch:            Not well visualized  Choroid Plexus:        Appears normal         Diaphragm:              Appears normal  Cerebellum:            Appears normal         Stomach:                Appears normal, left                                                                        sided  Posterior Fossa:       Appears normal         Abdomen:                Previously seen  Nuchal Fold:           Not applicable (>93    Abdominal Wall:         Previously seen                         wks GA)  Face:                  Not well visualized    Cord Vessels:           Previously seen  Lips:                  Appears normal         Kidneys:                Previously seen  Palate:                Not well visualized    Bladder:                Appears normal  Thoracic:              Appears normal         Spine:                  Not well visualized  Heart:                  Not well visualized    Upper Extremities:      Previously seen  RVOT:                  Not well visualized    Lower Extremities:      Previously seen  Other:  Technically difficult due to maternal habitus and fetal position. ---------------------------------------------------------------------- Doppler - Fetal Vessels  Umbilical Artery  ADFV    RDFV                                                              Yes      No ---------------------------------------------------------------------- Cervix Uterus Adnexa  Cervix  Not visualized (advanced GA >24wks)  Left Ovary  Not visualized.  Right Ovary  Not visualized.  Adnexa  No abnormality visualized. ---------------------------------------------------------------------- Impression  Ms. Niswander returned for fetal growth assessment.  High-risk problems include: 1) increased maternal serum AFP  (20.53 MoM) on mid-trimester screening, 2) marginal cord  insertion, 3) recent diagnosis of gestational hypertension.  Patient feels well without symptoms of severe features of  preeclampsia.  On ultrasound, the estimated fetal weight is at the 5th  percentile. Head circumference measurement is at between  mean and -1 SD (normal). Amniotic fluid is normal and good  fetal activity is seen. BPP 8/8.  Umbilical artery Doppler showed persistent absent-end-  diastolic flow. Marginal cord insertion is seen again.  I counseled the patient on the following:  -Explained the diagnosis of severe fetal growth restriction,  fetal weight percentile, and abnormal umbilical artery Doppler  study. Placental insufficiency is the most-likely cause.  Increased MSAFP seen in the second trimester is probably a  reflection of placental insufficiency. Gestational hypertension  is also from placental cause.  -Frequent monitoring is recommended (twice weekly) till  delivery.  -Discussed the benefit of antenatal corticosteroids.   -Delivery at 34 weeks would be recommended if UA Doppler  shows persistent AEDF or earlier if reversed-flow is seen.  We made appointments to have twice-weekly antenatal  testing [facility]. If BPP and  Doppler studies can be performed at your office, we would  recommend once-weekly testing here at the [facility] to avoid  patient inconvenience. ---------------------------------------------------------------------- Recommendations  -BPP, UA Doppler twice weekly.  -NST once weekly  -Betamethasone on 2 consecutive days (patient will decide  on the dates). ----------------------------------------------------------------------                  Tama High, MD Electronically Signed Final Report   11/23/2018 05:01 pm ----------------------------------------------------------------------  [place] Mfm Ua Cord Doppler  Result Date: 12/04/2018 ----------------------------------------------------------------------  OBSTETRICS REPORT                       (Signed Final 12/04/2018 09:04 am) ---------------------------------------------------------------------- Patient Info  ID #:       [id number]                          D.O.B.:  Sep 27, 1990 (28 yrs)  Name:       Janice Taylor                  Visit Date: 12/04/2018 07:13 am ---------------------------------------------------------------------- Performed By  Performed By:     Enriqueta Shutter           Ref. Address:     [facility]                    RDMS, RVT  OB/GYN                                                             [place]  Attending:        Tama High MD        [facility]                                                             [place]  Referred By:      Roma Schanz CNM  ---------------------------------------------------------------------- Orders   #  Description                          Code         Ordered By   1  [place] MFM UA CORD DOPPLER               76820.02     [place]   2  [place] MFM FETAL BPP WO NON              76819.01     CHARLIE PICKENS      STRESS  ----------------------------------------------------------------------   #  Order #                    Accession #                 Episode #   1  [id number]                  [id number]                  [id number]   2  [id number]                  [id number]                  [id number]  ---------------------------------------------------------------------- Indications   Encounter for other antenatal screening        Z36.2   follow-up (Positive Integrated for OSB 1:10)   Maternal morbid obesity  O99.210 E66.01   Abnormal biochemical screen for spina bifida   O28.9   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   management of mother in second trimester   (fundal)   Gestational hypertension without significant   O13.3   proteinuria, third trimester   Maternal care for known or suspected poor      O36.5931   fetal growth, third trimester, fetus 1   [redacted] weeks gestation of pregnancy                Z3A.31  ---------------------------------------------------------------------- Vital Signs                                                 Height:        5'4" ---------------------------------------------------------------------- Fetal Evaluation  Num Of Fetuses:         1  Fetal Heart Rate(bpm):  135  Cardiac Activity:       Observed  Presentation:           Cephalic  Amniotic Fluid  AFI FV:      Within normal limits  AFI Sum(cm)     %Tile       Largest Pocket(cm)  16.07           58          7.23  RUQ(cm)       RLQ(cm)       LUQ(cm)        LLQ(cm)  7.23          1.73          4.69           2.41 ---------------------------------------------------------------------- Biophysical Evaluation  Amniotic F.V:    Pocket => 2 cm two         F. Tone:        Observed                  planes  F. Movement:    Observed                   Score:          8/8  F. Breathing:   Observed ---------------------------------------------------------------------- OB History  Gravidity:    3         Term:   1        Prem:   0        SAB:   1  TOP:          0       Ectopic:  0        Living: 1 ---------------------------------------------------------------------- Gestational Age  LMP:           31w 2d        Date:  04/29/18                 EDD:   02/03/19  Best:          [place]els 2d     Det. By:  LMP  (04/29/18)          EDD:   02/03/19 ---------------------------------------------------------------------- Doppler - Fetal Vessels  Umbilical Artery  ADFV    RDFV                                                              Yes      No ---------------------------------------------------------------------- Impression  Fetal growth restriction. Her pregnancy was dated by 13-  week ultrasound.  Patient was admitted with fetal growth restriction with  abnormal Doppler studies. She has gestational hypertension.  Amniotic fluid is normal and good fetal activity is seen.  Antenatal testing is reassuring. BPP 8/8.  Umbilical artery Doppler study showed persistent absent-end-  diastolic flow. ---------------------------------------------------------------------- Recommendations  -Continue twice-weekly BPP and UA Doppler studies.  -Delivery decision to be dictated by fetal and maternal status.  -Provided antenatal testing remains reassuring (stable),  patient should be delivered at 34 weeks' gestation (absent  end-diastolic flow on the UA Doppler). ----------------------------------------------------------------------                  Tama High, MD Electronically Signed Final Report   12/04/2018 09:04 am ----------------------------------------------------------------------  [place] Mfm Ua Cord  Doppler  Result Date: 11/27/2018 ----------------------------------------------------------------------  OBSTETRICS REPORT                       (Signed Final 11/27/2018 01:22 pm) ---------------------------------------------------------------------- Patient Info  ID #:       [id number]                          D.O.B.:  1991-01-04 (28 yrs)  Name:       Janice Taylor                  Visit Date: 11/27/2018 12:21 pm ---------------------------------------------------------------------- Performed By  Performed By:     Valda Favia          Ref. Address:     [facility]                    RDMS                                                             OB/GYN                                                             [place]  [place]  Attending:        Tama High MD        [facility]  Referred By:      Roma Schanz CNM ---------------------------------------------------------------------- Orders   #  Description                          Code         Ordered By   1  [place] MFM FETAL BPP WO NON              76819.01     RAVI SHANKAR      STRESS   2  [place] MFM UA CORD DOPPLER               76820.02     RAVI [facility]  ----------------------------------------------------------------------   #  Order #                    Accession #                 Episode #   1  [id number]                  [id number]                  [id number]   2  [id number]                  [id number]                  [id number]  ---------------------------------------------------------------------- Indications   [redacted] weeks gestation of pregnancy                Z3A.30   Encounter for other antenatal screening        Z36.2   follow-up (Positive Integrated for OSB 1:10)   Maternal morbid obesity                         O99.210 E66.01   Abnormal biochemical screen for spina bifida   O28.9   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   management of mother in second trimester   (fundal)   Gestational hypertension without significant   O13.3   proteinuria, third trimester   Maternal care for known or suspected poor      O36.5931   fetal growth, third trimester, fetus 1  ---------------------------------------------------------------------- Vital Signs                                                 Height:        5'4" ---------------------------------------------------------------------- Fetal Evaluation  Num Of Fetuses:         1  Fetal Heart Rate(bpm):  129  Cardiac Activity:  Observed  Presentation:           Breech  Amniotic Fluid  AFI FV:      Within normal limits  AFI Sum(cm)     %Tile       Largest Pocket(cm)  13.5            42          6.61  RUQ(cm)       RLQ(cm)       LUQ(cm)        LLQ(cm)  2.86          6.61          0              4.03 ---------------------------------------------------------------------- Biophysical Evaluation  Amniotic F.V:   Within normal limits       F. Tone:        Observed  F. Movement:    Observed                   Score:          8/8  F. Breathing:   Observed ---------------------------------------------------------------------- OB History  Gravidity:    3         Term:   1        Prem:   0        SAB:   1  TOP:          0       Ectopic:  0        Living: 1 ---------------------------------------------------------------------- Gestational Age  LMP:           30w 2d        Date:  04/29/18                 EDD:   02/03/19  Best:          Weston Settle 2d     Det. By:  LMP  (04/29/18)          EDD:   02/03/19 ---------------------------------------------------------------------- Doppler - Fetal Vessels  Umbilical Artery   S/D     %tile     RI              PI                     ADFV    RDFV  9.03    > 97.5  0.89             2.01                         Y       N  ---------------------------------------------------------------------- Impression  Ms. Rayle returned for antenatal testing. She has  gestational hypertension and fetal growth restriction. We  made appointments for her to come twice-weekly for  antenatal testing.  She does not have symptoms of severe headache or visual  disturbances or vaginal bleeding. She c/o intermittent right  upper quadrant pain.  BP at our office: 145/69 and 164/85 mm Hg.  On ultrasound, amniotic fluid is normal and good fetal activity  is seen. Umbilical artery Doppler showed intermittent absent  end-diastolic flow. BPP 8/8.  Breech presentation.  I recommended evaluation at the MAU because of her  symptoms and hypertension.  Patient will be going over to the MAU. Discussed with Dr.  Roselie Awkward. ---------------------------------------------------------------------- Recommendations  -MAU evaluation.  -Betamethasone to be given.  -  Patient has follow-up appointments. ----------------------------------------------------------------------                  Tama High, MD Electronically Signed Final Report   11/27/2018 01:22 pm ----------------------------------------------------------------------  [place] Mfm Ua Cord Doppler  Result Date: 11/23/2018 ----------------------------------------------------------------------  OBSTETRICS REPORT                       (Signed Final 11/23/2018 05:01 pm) ---------------------------------------------------------------------- Patient Info  ID #:       [id number]                          D.O.B.:  04-Aug-1990 (28 yrs)  Name:       Janice Taylor                  Visit Date: 11/23/2018 03:28 pm ---------------------------------------------------------------------- Performed By  Performed By:     Ellin Saba          Ref. Address:     [facility]                    RDMS                                                             OB/GYN                                                             [address]                                                              [place]                                                             [place]  Attending:        Tama High MD        [facility]  Referred By:      Roma Schanz CNM ---------------------------------------------------------------------- Orders   #  Description  Code         Ordered By   1  [place] MFM OB FOLLOW UP                  [id number]     RAVI SHANKAR   2  [place] MFM FETAL BPP WO NON              76819.01     KIMBERLY BOOKER      STRESS   3  [place] MFM UA CORD DOPPLER               76820.02     [facility] BOOKER  ----------------------------------------------------------------------   #  Order #                    Accession #                 Episode #   1  [id number]   2  [id number]                  [id number]   3  [id number]                  [id number]                  [id number]  ---------------------------------------------------------------------- Indications   [redacted] weeks gestation of pregnancy                Z3A.29   Encounter for other antenatal screening        Z36.2   follow-up (Positive Integrated for OSB 1:10)   Maternal morbid obesity                        O99.210 E66.01   Abnormal biochemical screen for spina bifida   O28.9   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   management of mother in second trimester   (fundal)   Gestational hypertension without significant   O13.3   proteinuria, third trimester   Maternal care for known or suspected poor      O36.5931   fetal growth, third trimester, fetus 1  ---------------------------------------------------------------------- Vital Signs  Weight (lb): 363                               Height:        5'4"  BMI:         62.3  ---------------------------------------------------------------------- Fetal Evaluation  Num Of Fetuses:         1  Fetal Heart Rate(bpm):  142  Cardiac Activity:       Observed  Presentation:           Cephalic  Placenta:               Anterior  P. Cord Insertion:      Marginal insertion  Amniotic Fluid  AFI FV:      Within normal limits  AFI Sum(cm)     %Tile       Largest Pocket(cm)  14.03  46          4.65  RUQ(cm)       RLQ(cm)       LUQ(cm)        LLQ(cm)  1.29          4.24          4.65           3.85 ---------------------------------------------------------------------- Biophysical Evaluation  Amniotic F.V:   Within normal limits       F. Tone:        Observed  F. Movement:    Observed                   Score:          8/8  F. Breathing:   Observed ---------------------------------------------------------------------- Biometry  BPD:      73.1  mm     G. Age:  29w 2d         26  %    CI:        76.85   %    70 - 86                                                          FL/HC:      18.7   %    19.2 - 21.4  HC:      264.1  mm     G. Age:  28w 5d          4  %    HC/AC:      1.07        0.99 - 1.21  AC:      247.6  mm     G. Age:  29w 0d         23  %    FL/BPD:     67.7   %    71 - 87  FL:       49.5  mm     G. Age:  26w 5d        < 1  %    FL/AC:      20.0   %    20 - 24  HUM:      46.1  mm     G. Age:  27w 1d        < 5  %  LV:          5  mm  ULN:      48.2  mm     G. Age:  30w 5d         55  %  TIB:      43.8  mm     G. Age:  27w 0d        < 5  %  RAD:      40.1  mm     G. Age:  28w 0d         36  %  FIB:      44.2  mm     G. Age:  27w 1d         37  %  Est. FW:    1194  gm    2 lb 10 oz       5  % ----------------------------------------------------------------------  OB History  Gravidity:    3         Term:   1        Prem:   0        SAB:   1  TOP:          0       Ectopic:  0        Living: 1 ---------------------------------------------------------------------- Gestational Age  LMP:            29w 5d        Date:  04/29/18                 EDD:   02/03/19  U/S Today:     28w 3d                                        EDD:   02/12/19  Best:          29w 5d     Det. By:  LMP  (04/29/18)          EDD:   02/03/19 ---------------------------------------------------------------------- Anatomy  Cranium:               Appears normal         LVOT:                   Not well visualized  Cavum:                 Appears normal         Aortic Arch:            Not well visualized  Ventricles:            Appears normal         Ductal Arch:            Not well visualized  Choroid Plexus:        Appears normal         Diaphragm:              Appears normal  Cerebellum:            Appears normal         Stomach:                Appears normal, left                                                                        sided  Posterior Fossa:       Appears normal         Abdomen:                Previously seen  Nuchal Fold:           Not applicable (>62    Abdominal Wall:         Previously seen                         wks GA)  Face:  Not well visualized    Cord Vessels:           Previously seen  Lips:                  Appears normal         Kidneys:                Previously seen  Palate:                Not well visualized    Bladder:                Appears normal  Thoracic:              Appears normal         Spine:                  Not well visualized  Heart:                 Not well visualized    Upper Extremities:      Previously seen  RVOT:                  Not well visualized    Lower Extremities:      Previously seen  Other:  Technically difficult due to maternal habitus and fetal position. ---------------------------------------------------------------------- Doppler - Fetal Vessels  Umbilical Artery                                                            ADFV    RDFV                                                              Yes      No  ---------------------------------------------------------------------- Cervix Uterus Adnexa  Cervix  Not visualized (advanced GA >24wks)  Left Ovary  Not visualized.  Right Ovary  Not visualized.  Adnexa  No abnormality visualized. ---------------------------------------------------------------------- Impression  Ms. Fedora returned for fetal growth assessment.  High-risk problems include: 1) increased maternal serum AFP  (20.53 MoM) on mid-trimester screening, 2) marginal cord  insertion, 3) recent diagnosis of gestational hypertension.  Patient feels well without symptoms of severe features of  preeclampsia.  On ultrasound, the estimated fetal weight is at the 5th  percentile. Head circumference measurement is at between  mean and -1 SD (normal). Amniotic fluid is normal and good  fetal activity is seen. BPP 8/8.  Umbilical artery Doppler showed persistent absent-end-  diastolic flow. Marginal cord insertion is seen again.  I counseled the patient on the following:  -Explained the diagnosis of severe fetal growth restriction,  fetal weight percentile, and abnormal umbilical artery Doppler  study. Placental insufficiency is the most-likely cause.  Increased MSAFP seen in the second trimester is probably a  reflection of placental insufficiency. Gestational hypertension  is also from placental cause.  -Frequent monitoring is recommended (twice weekly) till  delivery.  -Discussed the benefit of antenatal corticosteroids.  -Delivery at 34 weeks would be recommended if UA Doppler  shows persistent AEDF or earlier if reversed-flow  is seen.  We made appointments to have twice-weekly antenatal  testing [facility]. If BPP and  Doppler studies can be performed at your office, we would  recommend once-weekly testing here at the [facility] to avoid  patient inconvenience. ---------------------------------------------------------------------- Recommendations  -BPP, UA Doppler twice weekly.  -NST once  weekly  -Betamethasone on 2 consecutive days (patient will decide  on the dates). ----------------------------------------------------------------------                  Tama High, MD Electronically Signed Final Report   11/23/2018 05:01 pm ----------------------------------------------------------------------   Future Appointments  Date Time Provider [place]  12/07/2018  2:00 PM [facility]  12/07/2018  2:00 PM [facility] [place] 3 WH-MFCUS MFC-US  12/12/2018  2:45 PM [place] - [facility]TO[facility] [place] [facility]-FTIMG None  12/12/2018  3:30 PM Florian Buff, MD [facility]-FT FTOBGYN  12/15/2018 12:15 PM [place] - [facility] [place] [facility] None  12/15/2018  1:00 PM Florian Buff, MD [facility]TO[facility]YN  12/19/2018 11:00 AM [facility] [place] [facility] None  12/19/2018 11:45 AM Florian Buff, MD [facility]  12/22/2018 11:00 AM [facility] [place] [facility] None  12/22/2018 11:45 AM Cresenzo-Dishmon, Joaquim Lai, CNM [facility]-FT FTOBGYN    Discharge Condition: Stable  Discharge disposition: 01-Home or Self Care       Discharge Instructions    Discharge activity:   Complete by: As directed    Limited activity   Discharge diet:  No restrictions   Complete by: As directed    Fetal Kick Count:  Lie on our left side for one hour after a meal, and count the number of times your baby kicks.  If it is less than 5 times, get up, move around and drink some juice.  Repeat the test 30 minutes later.  If it is still less than 5 kicks in an hour, notify your doctor.   Complete by: As directed    Notify physician for leaking of fluid   Complete by: As directed    Notify physician for vaginal bleeding   Complete by: As directed    PRETERM LABOR:  Includes any of the follwing symptoms that occur between 20 - [redacted] weeks gestation.  If these symptoms are not stopped, preterm labor can result in preterm delivery, placing your baby at risk   Complete by: As directed      Allergies as of 12/04/2018      Reactions   Codeine Nausea And  Vomiting   Ondansetron Hcl Other (See Comments)   Not effective`      Medication List    STOP taking these medications   acyclovir 400 MG tablet Commonly known as: ZOVIRAX   nitrofurantoin (macrocrystal-monohydrate) 100 MG capsule Commonly known as: MACROBID   promethazine 25 MG tablet Commonly known as: PHENERGAN   traMADol-acetaminophen 37.5-325 MG tablet Commonly known as: ULTRACET     TAKE these medications   citalopram 20 MG tablet Commonly known as: CELEXA Take 10mg  (1/2 tablet) daily x 1 week, then 20mg  (1 tablet) daily thereafter   cyclobenzaprine 10 MG tablet Commonly known as: FLEXERIL Take 1 tablet (10 mg total) by mouth 3 (three) times daily as needed for muscle spasms.   docusate sodium 100 MG capsule Commonly known as: COLACE Take 1 capsule (100 mg total) by mouth 2 (two) times daily as needed for mild constipation.   famotidine 20 MG tablet Commonly known as: PEPCID Take 1 tablet (20 mg total) by  mouth daily as needed for heartburn or indigestion.   Flinstones Gummies Omega-3 DHA Chew Chew 1 tablet by mouth daily.   hydrOXYzine 10 MG tablet Commonly known as: ATARAX/VISTARIL Take 1 tablet (10 mg total) by mouth 3 (three) times daily as needed for anxiety.   multivitamin-prenatal 27-0.8 MG Tabs tablet Take 1 tablet by mouth daily at 12 noon.   valACYclovir 1000 MG tablet Commonly known as: VALTREX Take 1 tablet (1,000 mg total) by mouth daily. Start taking on: December 05, 2018   zolpidem 5 MG tablet Commonly known as: AMBIEN Take 1 tablet (5 mg total) by mouth at bedtime as needed for sleep.        Signed: Verita Schneiders M.D. 12/04/2018, 2:52 PM

## 2018-12-05 ENCOUNTER — Ambulatory Visit (HOSPITAL_COMMUNITY): Payer: Medicaid Other

## 2018-12-05 ENCOUNTER — Ambulatory Visit (HOSPITAL_COMMUNITY)
Admission: RE | Admit: 2018-12-05 | Discharge: 2018-12-05 | Disposition: A | Discharge status: Home / Self Care | Payer: Medicaid Other | Source: Ambulatory Visit | Attending: Obstetrics and Gynecology | Admitting: Obstetrics and Gynecology

## 2018-12-07 ENCOUNTER — Other Ambulatory Visit: Payer: Self-pay

## 2018-12-07 ENCOUNTER — Encounter (HOSPITAL_COMMUNITY): Payer: Self-pay

## 2018-12-07 ENCOUNTER — Ambulatory Visit (HOSPITAL_COMMUNITY): Payer: Medicaid Other | Admitting: *Deleted

## 2018-12-07 ENCOUNTER — Ambulatory Visit (HOSPITAL_COMMUNITY)
Admission: RE | Admit: 2018-12-07 | Discharge: 2018-12-07 | Disposition: A | Discharge status: Home / Self Care | Payer: Medicaid Other | Source: Ambulatory Visit | Attending: Obstetrics and Gynecology | Admitting: Obstetrics and Gynecology

## 2018-12-07 ENCOUNTER — Inpatient Hospital Stay (HOSPITAL_COMMUNITY)
Admission: AD | Admit: 2018-12-07 | Discharge: 2018-12-15 | DRG: 832 | Discharge status: Against Medical Advice | Payer: Medicaid Other | Attending: Obstetrics and Gynecology | Admitting: Obstetrics and Gynecology

## 2018-12-07 ENCOUNTER — Other Ambulatory Visit (HOSPITAL_COMMUNITY): Payer: Self-pay | Admitting: Obstetrics and Gynecology

## 2018-12-07 DIAGNOSIS — O43199 Other malformation of placenta, unspecified trimester: Secondary | ICD-10-CM | POA: Diagnosis present

## 2018-12-07 DIAGNOSIS — G44209 Tension-type headache, unspecified, not intractable: Secondary | ICD-10-CM | POA: Diagnosis present

## 2018-12-07 DIAGNOSIS — O365930 Maternal care for other known or suspected poor fetal growth, third trimester, not applicable or unspecified: Secondary | ICD-10-CM | POA: Insufficient documentation

## 2018-12-07 DIAGNOSIS — O0990 Supervision of high risk pregnancy, unspecified, unspecified trimester: Secondary | ICD-10-CM | POA: Insufficient documentation

## 2018-12-07 DIAGNOSIS — M9901 Segmental and somatic dysfunction of cervical region: Secondary | ICD-10-CM | POA: Diagnosis present

## 2018-12-07 DIAGNOSIS — O368130 Decreased fetal movements, third trimester, not applicable or unspecified: Secondary | ICD-10-CM | POA: Diagnosis not present

## 2018-12-07 DIAGNOSIS — O99343 Other mental disorders complicating pregnancy, third trimester: Secondary | ICD-10-CM | POA: Diagnosis present

## 2018-12-07 DIAGNOSIS — Z5329 Procedure and treatment not carried out because of patient's decision for other reasons: Secondary | ICD-10-CM | POA: Diagnosis present

## 2018-12-07 DIAGNOSIS — Z3A31 31 weeks gestation of pregnancy: Secondary | ICD-10-CM

## 2018-12-07 DIAGNOSIS — O99213 Obesity complicating pregnancy, third trimester: Secondary | ICD-10-CM | POA: Diagnosis present

## 2018-12-07 DIAGNOSIS — O0993 Supervision of high risk pregnancy, unspecified, third trimester: Secondary | ICD-10-CM | POA: Diagnosis not present

## 2018-12-07 DIAGNOSIS — O4103X1 Oligohydramnios, third trimester, fetus 1: Secondary | ICD-10-CM | POA: Diagnosis present

## 2018-12-07 DIAGNOSIS — O99353 Diseases of the nervous system complicating pregnancy, third trimester: Secondary | ICD-10-CM | POA: Diagnosis present

## 2018-12-07 DIAGNOSIS — F411 Generalized anxiety disorder: Secondary | ICD-10-CM | POA: Diagnosis present

## 2018-12-07 DIAGNOSIS — O365990 Maternal care for other known or suspected poor fetal growth, unspecified trimester, not applicable or unspecified: Secondary | ICD-10-CM

## 2018-12-07 DIAGNOSIS — O43123 Velamentous insertion of umbilical cord, third trimester: Secondary | ICD-10-CM | POA: Diagnosis present

## 2018-12-07 DIAGNOSIS — Z3A33 33 weeks gestation of pregnancy: Secondary | ICD-10-CM | POA: Diagnosis not present

## 2018-12-07 DIAGNOSIS — O4103X0 Oligohydramnios, third trimester, not applicable or unspecified: Secondary | ICD-10-CM | POA: Diagnosis present

## 2018-12-07 DIAGNOSIS — O289 Unspecified abnormal findings on antenatal screening of mother: Secondary | ICD-10-CM | POA: Diagnosis not present

## 2018-12-07 DIAGNOSIS — O368330 Maternal care for abnormalities of the fetal heart rate or rhythm, third trimester, not applicable or unspecified: Secondary | ICD-10-CM | POA: Diagnosis present

## 2018-12-07 DIAGNOSIS — O365931 Maternal care for other known or suspected poor fetal growth, third trimester, fetus 1: Secondary | ICD-10-CM

## 2018-12-07 DIAGNOSIS — M542 Cervicalgia: Secondary | ICD-10-CM | POA: Diagnosis present

## 2018-12-07 DIAGNOSIS — O133 Gestational [pregnancy-induced] hypertension without significant proteinuria, third trimester: Secondary | ICD-10-CM | POA: Diagnosis present

## 2018-12-07 DIAGNOSIS — O26893 Other specified pregnancy related conditions, third trimester: Secondary | ICD-10-CM | POA: Diagnosis present

## 2018-12-07 DIAGNOSIS — Z8759 Personal history of other complications of pregnancy, childbirth and the puerperium: Secondary | ICD-10-CM | POA: Diagnosis present

## 2018-12-07 DIAGNOSIS — O365939 Maternal care for other known or suspected poor fetal growth, third trimester, other fetus: Secondary | ICD-10-CM

## 2018-12-07 DIAGNOSIS — Z3A32 32 weeks gestation of pregnancy: Secondary | ICD-10-CM | POA: Diagnosis not present

## 2018-12-07 DIAGNOSIS — O4100X0 Oligohydramnios, unspecified trimester, not applicable or unspecified: Secondary | ICD-10-CM

## 2018-12-07 DIAGNOSIS — O43193 Other malformation of placenta, third trimester: Secondary | ICD-10-CM | POA: Diagnosis not present

## 2018-12-07 DIAGNOSIS — M9900 Segmental and somatic dysfunction of head region: Secondary | ICD-10-CM | POA: Diagnosis present

## 2018-12-07 DIAGNOSIS — O9989 Other specified diseases and conditions complicating pregnancy, childbirth and the puerperium: Secondary | ICD-10-CM | POA: Diagnosis not present

## 2018-12-07 LAB — COMPREHENSIVE METABOLIC PANEL
ALT: 16 U/L (ref 0–44)
AST: 16 U/L (ref 15–41)
Albumin: 2.6 g/dL — ABNORMAL LOW (ref 3.5–5.0)
Alkaline Phosphatase: 101 U/L (ref 38–126)
Anion gap: 11 (ref 5–15)
BUN: 7 mg/dL (ref 6–20)
CO2: 21 mmol/L — ABNORMAL LOW (ref 22–32)
Calcium: 8.9 mg/dL (ref 8.9–10.3)
Chloride: 100 mmol/L (ref 98–111)
Creatinine, Ser: 0.75 mg/dL (ref 0.44–1.00)
GFR calc Af Amer: >60 mL/min (ref >60)
GFR calc non Af Amer: >60 mL/min (ref >60)
Glucose, Bld: 153 mg/dL — ABNORMAL HIGH (ref 70–99)
Potassium: 4.1 mmol/L (ref 3.5–5.1)
Sodium: 132 mmol/L — ABNORMAL LOW (ref 135–145)
Total Bilirubin: 0.2 mg/dL — ABNORMAL LOW (ref 0.3–1.2)
Total Protein: 5.8 g/dL — ABNORMAL LOW (ref 6.5–8.1)

## 2018-12-07 LAB — CBC
HCT: 36.8 % (ref 36.0–46.0)
Hemoglobin: 12.1 g/dL (ref 12.0–15.0)
MCH: 30 pg (ref 26.0–34.0)
MCHC: 32.9 g/dL (ref 30.0–36.0)
MCV: 91.3 fL (ref 80.0–100.0)
Platelets: 169 10*3/uL (ref 150–400)
RBC: 4.03 MIL/uL (ref 3.87–5.11)
RDW: 12.9 % (ref 11.5–15.5)
WBC: 11.9 10*3/uL — ABNORMAL HIGH (ref 4.0–10.5)
nRBC: 0 % (ref 0.0–0.2)

## 2018-12-07 LAB — PROTEIN / CREATININE RATIO, URINE
Creatinine, Urine: 72.42 mg/dL
Protein Creatinine Ratio: 0.1 mg/mg{Cre} (ref 0.00–0.15)
Total Protein, Urine: 7 mg/dL

## 2018-12-07 LAB — TYPE AND SCREEN
ABO/RH(D): A POS
Antibody Screen: NEGATIVE

## 2018-12-07 MED ORDER — LACTATED RINGERS IV SOLN
INTRAVENOUS | Status: DC
  Administered 2018-12-07 – 2018-12-09 (×5): via INTRAVENOUS

## 2018-12-07 MED ORDER — PRENATAL MULTIVITAMIN CH
1.0000 | ORAL_TABLET | Freq: Every day | ORAL | Status: DC
  Administered 2018-12-08 – 2018-12-14 (×7): 1 via ORAL
  Filled 2018-12-07 (×7): qty 1

## 2018-12-07 MED ORDER — ACETAMINOPHEN 325 MG PO TABS
650.0000 mg | ORAL_TABLET | ORAL | Status: DC | PRN
  Administered 2018-12-08 – 2018-12-12 (×4): 650 mg via ORAL
  Filled 2018-12-07 (×4): qty 2

## 2018-12-07 MED ORDER — HYDROXYZINE HCL 25 MG PO TABS
25.0000 mg | ORAL_TABLET | Freq: Three times a day (TID) | ORAL | Status: DC | PRN
  Administered 2018-12-07 – 2018-12-14 (×7): 25 mg via ORAL
  Filled 2018-12-07 (×8): qty 1

## 2018-12-07 MED ORDER — CALCIUM CARBONATE ANTACID 500 MG PO CHEW
2.0000 | CHEWABLE_TABLET | ORAL | Status: DC | PRN
  Administered 2018-12-09 – 2018-12-13 (×4): 400 mg via ORAL
  Filled 2018-12-07 (×4): qty 2

## 2018-12-07 MED ORDER — ZOLPIDEM TARTRATE 5 MG PO TABS
5.0000 mg | ORAL_TABLET | Freq: Every evening | ORAL | Status: DC | PRN
  Administered 2018-12-07 – 2018-12-14 (×8): 5 mg via ORAL
  Filled 2018-12-07 (×8): qty 1

## 2018-12-07 MED ORDER — CITALOPRAM HYDROBROMIDE 10 MG PO TABS
10.0000 mg | ORAL_TABLET | Freq: Every day | ORAL | Status: DC
  Administered 2018-12-07 – 2018-12-14 (×8): 10 mg via ORAL
  Filled 2018-12-07 (×8): qty 1

## 2018-12-07 MED ORDER — DOCUSATE SODIUM 100 MG PO CAPS
100.0000 mg | ORAL_CAPSULE | Freq: Every day | ORAL | Status: DC
  Administered 2018-12-08 – 2018-12-14 (×4): 100 mg via ORAL
  Filled 2018-12-07 (×5): qty 1

## 2018-12-07 NOTE — MAU Note (Signed)
Pt was upset when notified she would be admitted, so I will return to take B/P in a few mins, FHR monitoring started.

## 2018-12-07 NOTE — Procedures (Signed)
Janice Taylor 1990-09-21 [redacted]w[redacted]d  Fetus A Non-Stress Test Interpretation for 12/07/18  Indication: IUGR, unsatisfactory BPP  Fetal Heart Rate A Mode: External Baseline Rate (A): 145 bpm Variability: Moderate Accelerations: None Decelerations: None  Uterine Activity Mode: Toco Contraction Frequency (min): none noted  Interpretation (Fetal Testing) Nonstress Test Interpretation: Non-reactive Comments: FHR tracing rev'd by Dr. Gertie Exon

## 2018-12-07 NOTE — Progress Notes (Signed)
Pt on phone with her mother and upset about being admitted. Pt worried about her child at home. Pt does not want an IV started. Pt requests an order for Vistaril for her anxiety.

## 2018-12-07 NOTE — MAU Note (Signed)
Sent over for further eval, baby measuring small, non-reactive nurse said unable to get consistent tracing due to increased fetal activity.  BPP was 6/8.  States feels ok, just very anxious. No pain, bleeding or leaking.

## 2018-12-07 NOTE — Progress Notes (Signed)
Report called to J. Spurlock-Frizzell, Therapist, sports.  Pt to go to MAU for further fetal monitoring. Instructions given to pt.

## 2018-12-07 NOTE — H&P (Addendum)
Janice Taylor is a 28 y.o. female presenting after routine MFM appointment showed BPP 6/8 (-2 for tone) and an NST that was unable to be aquired. Of note, pt also has a single severe range pressure on admission to MAU. Pt is also being monitored for gHTN, increased AFP, severe FGR 5% with persistent AEDF, marginal cord insertion. Pt was given BMZ on 11/27/2018 & 11/28/2018.  OB History    Gravida  3   Para  1   Term  1   Preterm      AB  1   Living  1     SAB  1   TAB      Ectopic      Multiple      Live Births  1          Past Medical History:  Diagnosis Date  . ADHD (attention deficit hyperactivity disorder)   . Anxiety   . Clotting disorder ([place])   . Cough   . Depression   . Genital HSV   . 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 Leg swelling   . Obesity 01/05/2013  . Thrombocytopenia ([place]) 11/25/2011   Consistent with ITP   Past Surgical History:  Procedure Laterality Date  . TONSILECTOMY, ADENOIDECTOMY, BILATERAL MYRINGOTOMY AND TUBES     Family History: family history includes Diabetes in her maternal uncle; Heart attack in her maternal grandmother; Kidney disease in her maternal uncle. Social History:  reports that she has never smoked. She has never used smokeless tobacco. She reports that she does not drink alcohol or use drugs.     Maternal Diabetes: No Genetic Screening: Abnormal:  Results: Elevated AFP, increased risk of ONTD Maternal Ultrasounds/Referrals: IUGR and Other: persistent AEDF, marginal cord insertion Fetal Ultrasounds or other Referrals:  Referred to Materal Fetal Medicine  Maternal Substance Abuse:  No Significant Maternal Medications:  Meds include: Other: celexa, vistaril, valtrex Significant Maternal Lab Results:  Group B Strep negative Other Comments:  None  ROS Maternal Medical History:  Fetal activity: Perceived fetal activity is normal.   Last perceived fetal movement was within the  past hour.    Prenatal complications: PIH, IUGR, oligohydramnios and thrombocytopenia.       Blood pressure (!) 164/88, pulse 100, temperature 98.6 F (37 C), temperature source Oral, resp. rate 18, weight (!) 146.6 kg, last menstrual period 04/29/2018, SpO2 98 %.   Patient Vitals for the past 24 hrs:  BP Temp Temp src Pulse Resp SpO2 Weight  12/07/18 1755 (!) 164/88 98.6 F (37 C) Oral 100 18 98 % (!) 146.6 kg   Exam Physical Exam  Constitutional: She is oriented to person, place, and time. She appears well-developed and well-nourished. No distress.  Respiratory: Effort normal.  Neurological: She is alert and oriented to person, place, and time.  Skin: Skin is warm and dry. She is not diaphoretic.  Psychiatric: Her behavior is normal. Judgment and thought content normal. Her mood appears anxious.    Prenatal labs: ABO, Rh: --/--/A POS (08/07 1822) Antibody: NEG (08/07 1822) Rubella: <0.90 (03/11 1549) RPR: Non Reactive (07/14 0958)  HBsAg: Negative (03/11 1549)  HIV: Non Reactive (07/14 0958)  GBS: Negative (08/09 0000)   Assessment/Plan:  -admit to [facility] for observation and continuous monitoring  OB Attending Pt seen and examined. Will admit for IV hydration, monitoring, and repeat U/S in AM. POC reviewed with with pt. Pt verbalized understanding  Arlina Robes, MD

## 2018-12-08 ENCOUNTER — Encounter (HOSPITAL_COMMUNITY): Payer: Self-pay

## 2018-12-08 ENCOUNTER — Inpatient Hospital Stay (HOSPITAL_COMMUNITY): Payer: Medicaid Other

## 2018-12-08 ENCOUNTER — Other Ambulatory Visit (HOSPITAL_COMMUNITY): Payer: Self-pay | Admitting: *Deleted

## 2018-12-08 ENCOUNTER — Ambulatory Visit (HOSPITAL_COMMUNITY): Payer: Medicaid Other

## 2018-12-08 DIAGNOSIS — O133 Gestational [pregnancy-induced] hypertension without significant proteinuria, third trimester: Secondary | ICD-10-CM

## 2018-12-08 DIAGNOSIS — Z8759 Personal history of other complications of pregnancy, childbirth and the puerperium: Secondary | ICD-10-CM | POA: Diagnosis present

## 2018-12-08 DIAGNOSIS — O365931 Maternal care for other known or suspected poor fetal growth, third trimester, fetus 1: Secondary | ICD-10-CM

## 2018-12-08 DIAGNOSIS — O43193 Other malformation of placenta, third trimester: Secondary | ICD-10-CM

## 2018-12-08 DIAGNOSIS — O289 Unspecified abnormal findings on antenatal screening of mother: Secondary | ICD-10-CM

## 2018-12-08 DIAGNOSIS — O4103X1 Oligohydramnios, third trimester, fetus 1: Secondary | ICD-10-CM | POA: Diagnosis present

## 2018-12-08 DIAGNOSIS — O4100X0 Oligohydramnios, unspecified trimester, not applicable or unspecified: Secondary | ICD-10-CM

## 2018-12-08 DIAGNOSIS — Z3A31 31 weeks gestation of pregnancy: Secondary | ICD-10-CM

## 2018-12-08 DIAGNOSIS — O99213 Obesity complicating pregnancy, third trimester: Secondary | ICD-10-CM

## 2018-12-08 MED ORDER — LACTATED RINGERS IV BOLUS
1000.0000 mL | Freq: Once | INTRAVENOUS | Status: AC
  Administered 2018-12-08: 1000 mL via INTRAVENOUS

## 2018-12-08 NOTE — Progress Notes (Signed)
Patient ID: Janice Taylor, female   DOB: 06/18/1990, 28 y.o.   MRN: [id number] [place]) NOTE  Janice Taylor is a 28 y.o. G3P1011 at [redacted]w[redacted]d by best clinical estimate who is admitted for [facility] with concern about possible severe BP, severe IUGR, persistent AEDF, low amniotic fluid, marginal cord insertion. Fetal presentation is cephalic. Length of Stay:  1  Days  Subjective: No complaints. Patient denies any headaches, visual symptoms, RUQ/epigastric pain or other concerning symptoms. Patient reports the fetal movement as active. Patient reports uterine contraction  activity as none. Patient reports  vaginal bleeding as none. Patient describes fluid per vagina as none.  Vitals:  Blood pressure 138/76, pulse 87, temperature 98.1 F (36.7 C), temperature source Oral, resp. rate 18, weight (!) 146.6 kg, last menstrual period 04/29/2018, SpO2 100 %.  Patient Vitals for the past 24 hrs:  BP Temp Temp src Pulse Resp SpO2 Weight  12/08/18 0754 138/76 98.1 F (36.7 C) Oral 87 18 100 % -  12/08/18 0506 124/64 98 F (36.7 C) Oral 95 18 99 % -  12/07/18 2104 (!) 144/74 98.1 F (36.7 C) Oral 89 18 100 % -  12/07/18 2003 113/84 98.1 F (36.7 C) Oral 88 18 100 % -  12/07/18 1945 (!) 159/83 - - 87 17 99 % -  12/07/18 1935 (!) 160/78 98 F (36.7 C) Oral 92 18 96 % -  12/07/18 1930 - - - - - 98 % -  12/07/18 1920 - - - - - 97 % -  12/07/18 1900 - - - - - 97 % -  12/07/18 1854 (!) 150/76 - - 89 - 98 % -  12/07/18 1755 (!) 164/88 98.6 F (37 C) Oral 100 18 98 % (!) 146.6 kg    Physical Examination: General appearance - alert, well appearing, and in no distress Chest - normal effort Abdomen - gravid, non-tender Fundal Height:  size equals dates Extremities: Homans sign is negative, no sign of DVT  Membranes:intact  Fetal Monitoring:  Baseline: 140 bpm, Variability: Moderate {> 6 bpm), Accelerations: Reactive and Decelerations: Absent  Imaging: [place] Mfm Fetal Bpp  W/nonstress  Result Date: 12/07/2018 ----------------------------------------------------------------------  OBSTETRICS REPORT                       (Signed Final 12/07/2018 04:30 pm) ---------------------------------------------------------------------- Patient Info  ID #:       [id number]                          D.O.B.:  01/17/1991 (28 yrs)  Name:       Janice Taylor                  Visit Date: 12/07/2018 02:51 pm ---------------------------------------------------------------------- Performed By  Performed By:     Berlinda Last          Ref. Address:     [place]                                                             OB/GYN  [place]                                                             [place]  Attending:        Sander Nephew      [facility]                    MD                                       [facility]  Referred By:      Roma Schanz CNM ---------------------------------------------------------------------- Orders   #  Description                          Code         Ordered By   1  [place] MFM FETAL BPP                     71696.7      RAVI [facility]      W/NONSTRESS   2  [place] MFM UA CORD DOPPLER               76820.02     [facility]  ----------------------------------------------------------------------   #  Order #                    Accession #                 Episode #   1  [id number]                  [id number]   2  [id number]                  [id number]  ---------------------------------------------------------------------- Indications   Small for gestational age fetus affecting      O60.5990   management of mother   [redacted] weeks gestation of pregnancy                Z3A.31   Maternal morbid obesity                         O99.210 E66.01   Abnormal biochemical screen for spina bifida   O28.9   Gestational hypertension without significant   O13.3   proteinuria, third trimester   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  management of mother in third trimester  ---------------------------------------------------------------------- Vital Signs                                                 Height:        5'4" ---------------------------------------------------------------------- Fetal Evaluation  Num Of Fetuses:         1  Fetal Heart Rate(bpm):  148  Cardiac Activity:       Observed  Presentation:           Cephalic  Amniotic Fluid  AFI FV:      Within normal limits  AFI Sum(cm)     %Tile       Largest Pocket(cm)  6.56            < 3         2.65  RUQ(cm)       RLQ(cm)       LUQ(cm)        LLQ(cm)  2.65          1.26          1.44           1.21 ---------------------------------------------------------------------- Biophysical Evaluation  Amniotic F.V:   Pocket => 2 cm two         F. Tone:        Observed                  planes  F. Movement:    Not Observed               Score:          6/8  F. Breathing:   Observed ---------------------------------------------------------------------- OB History  Gravidity:    3         Term:   1        Prem:   0        SAB:   1  TOP:          0       Ectopic:  0        Living: 1 ---------------------------------------------------------------------- Gestational Age  LMP:           31w 5d        Date:  04/29/18                 EDD:   02/03/19  Best:          31w 5d     Det. By:  LMP  (04/29/18)          EDD:   02/03/19 ---------------------------------------------------------------------- Anatomy  Stomach:               Appears normal, left   Bladder:                Appears normal                         sided ---------------------------------------------------------------------- Doppler - Fetal Vessels  Umbilical Artery   S/D     %tile                                            ADFV     RDFV  8.2    > 97.5                                              Yes      No ---------------------------------------------------------------------- Impression  Gestational HTN  Known IUGR with EFW 5th%.  UA Dopplers- AEDF  BPP 6/8 (-2  Tone) The NST was attempted but unreadable.  Prior study reported amniotic fluid 16 cm however after  reevaluation the fluid was some what less 11 cm. Ms.  [place] AFV today measures  6 cm which is decreased but  objectively does not meet the criteria for oligohydramnios.  S/P BMZ ---------------------------------------------------------------------- Recommendations  TO MAU For further monitoring.  AFI is scheduled to be repeated in 24 hours if abnormal  consider admission to L&D for hydration and or possible  delivery if oligohydramnios persist.  If resolved delivery by 34 weeks. ----------------------------------------------------------------------               Sander Nephew, MD Electronically Signed Final Report   12/07/2018 04:30 pm ----------------------------------------------------------------------  [place] Mfm Ua Cord Doppler  Result Date: 12/07/2018 ----------------------------------------------------------------------  OBSTETRICS REPORT                       (Signed Final 12/07/2018 04:30 pm) ---------------------------------------------------------------------- Patient Info  ID #:       [id number]                          D.O.B.:  1991/02/28 (28 yrs)  Name:       Janice Taylor                  Visit Date: 12/07/2018 02:51 pm ---------------------------------------------------------------------- Performed By  Performed By:     Berlinda Last          Ref. Address:     [facility]                    RDMS                                                             OB/GYN                                                             [place]  [place]  Attending:        Sander Nephew      Location[facility]                    MD                                       Fetal Care  Referred By:      Roma Schanz CNM ---------------------------------------------------------------------- Orders   #  Description                          Code         Ordered By   1  [place] MFM FETAL BPP                     62836.6      RAVI [facility]      W/NONSTRESS   2  [place] MFM UA CORD DOPPLER               76820.02     [facility]  ----------------------------------------------------------------------   #  Order #                    Accession #                 Episode #   1  [id number]                  [id number]   2  [id number]                  [id number]                  [id number]  ---------------------------------------------------------------------- Indications   Small for gestational age fetus affecting      O45.5990   management of mother   [redacted] weeks gestation of pregnancy                Z3A.31   Maternal morbid obesity                        O99.210 E66.01   Abnormal biochemical screen for spina bifida   O28.9   Gestational hypertension without significant   O13.3   proteinuria, third trimester   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   management of mother in third trimester  ---------------------------------------------------------------------- Vital Signs                                                 Height:        5'4" ---------------------------------------------------------------------- Fetal Evaluation  Num Of Fetuses:         1  Fetal Heart Rate(bpm):  148  Cardiac Activity:       Observed  Presentation:           Cephalic  Amniotic Fluid  AFI FV:      Within normal  limits  AFI Sum(cm)     %Tile       Largest Pocket(cm)  6.56            < 3         2.65  RUQ(cm)       RLQ(cm)       LUQ(cm)        LLQ(cm)  2.65          1.26          1.44           1.21  ---------------------------------------------------------------------- Biophysical Evaluation  Amniotic F.V:   Pocket => 2 cm two         F. Tone:        Observed                  planes  F. Movement:    Not Observed               Score:          6/8  F. Breathing:   Observed ---------------------------------------------------------------------- OB History  Gravidity:    3         Term:   1        Prem:   0        SAB:   1  TOP:          0       Ectopic:  0        Living: 1 ---------------------------------------------------------------------- Gestational Age  LMP:           31w 5d        Date:  04/29/18                 EDD:   02/03/19  Best:          31w 5d     Det. By:  LMP  (04/29/18)          EDD:   02/03/19 ---------------------------------------------------------------------- Anatomy  Stomach:               Appears normal, left   Bladder:                Appears normal                         sided ---------------------------------------------------------------------- Doppler - Fetal Vessels  Umbilical Artery   S/D     %tile                                            ADFV    RDFV   8.2    > 97.5                                              Yes      No ---------------------------------------------------------------------- Impression  Gestational HTN  Known IUGR with EFW 5th%.  UA Dopplers- AEDF  BPP 6/8 (-2  Tone) The NST was attempted but unreadable.  Prior study reported amniotic fluid 16 cm however after  reevaluation the fluid was some what less 11 cm. Ms.  [place] AFV today measures  6 cm which is decreased but  objectively does not meet the criteria  for oligohydramnios.  S/P BMZ ---------------------------------------------------------------------- Recommendations  TO MAU For further monitoring.  AFI is scheduled to be repeated in 24 hours if abnormal  consider admission to L&D for hydration and or possible  delivery if oligohydramnios persist.  If resolved delivery by 34 weeks.  ----------------------------------------------------------------------               Sander Nephew, MD Electronically Signed Final Report   12/07/2018 04:30 pm ----------------------------------------------------------------------  Medications:  Scheduled . citalopram  10 mg Oral Daily  . docusate sodium  100 mg Oral Daily  . prenatal multivitamin  1 tablet Oral Q1200   I have reviewed the patient's current medications.  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 Stable BP since admission, no other severe features, continue to monitor. Continue anxiety medications Routine antenatal care for now.  Verita Schneiders, MD 12/08/2018,11:11 AM

## 2018-12-08 NOTE — Progress Notes (Signed)
Pt took herself off of monitors. Dr. Harolyn Rutherford at bedside and made aware. Monitoring orders changed to BID.

## 2018-12-09 DIAGNOSIS — Z3A32 32 weeks gestation of pregnancy: Secondary | ICD-10-CM

## 2018-12-09 LAB — COMPREHENSIVE METABOLIC PANEL
ALT: 16 U/L (ref 0–44)
AST: 14 U/L — ABNORMAL LOW (ref 15–41)
Albumin: 2.5 g/dL — ABNORMAL LOW (ref 3.5–5.0)
Alkaline Phosphatase: 88 U/L (ref 38–126)
Anion gap: 9 (ref 5–15)
BUN: 8 mg/dL (ref 6–20)
CO2: 23 mmol/L (ref 22–32)
Calcium: 8.7 mg/dL — ABNORMAL LOW (ref 8.9–10.3)
Chloride: 103 mmol/L (ref 98–111)
Creatinine, Ser: 0.65 mg/dL (ref 0.44–1.00)
GFR calc Af Amer: >60 mL/min (ref >60)
GFR calc non Af Amer: >60 mL/min (ref >60)
Glucose, Bld: 83 mg/dL (ref 70–99)
Potassium: 4.3 mmol/L (ref 3.5–5.1)
Sodium: 135 mmol/L (ref 135–145)
Total Bilirubin: 0.4 mg/dL (ref 0.3–1.2)
Total Protein: 5.4 g/dL — ABNORMAL LOW (ref 6.5–8.1)

## 2018-12-09 LAB — CBC
HCT: 34.5 % — ABNORMAL LOW (ref 36.0–46.0)
Hemoglobin: 11.4 g/dL — ABNORMAL LOW (ref 12.0–15.0)
MCH: 30.1 pg (ref 26.0–34.0)
MCHC: 33 g/dL (ref 30.0–36.0)
MCV: 91 fL (ref 80.0–100.0)
Platelets: 164 10*3/uL (ref 150–400)
RBC: 3.79 MIL/uL — ABNORMAL LOW (ref 3.87–5.11)
RDW: 13 % (ref 11.5–15.5)
WBC: 10.1 10*3/uL (ref 4.0–10.5)
nRBC: 0 % (ref 0.0–0.2)

## 2018-12-09 NOTE — Progress Notes (Signed)
Patient ID: Janice Taylor, female   DOB: [birth year], 28 y.o.   MRN: [id number] [place]) NOTE  Janice Taylor is a 28 y.o. G3P1011 at [redacted]w[redacted]d by best clinical estimate who is admitted for [facility] with concern about possible severe BP, severe IUGR, persistent AEDF, low amniotic fluid, marginal cord insertion. Fetal presentation is cephalic. Length of Stay:  2  Days  Subjective: No complaints. Patient denies any headaches, visual symptoms, RUQ/epigastric pain or other concerning symptoms. Patient reports the fetal movement as active. Patient reports uterine contraction  activity as none. Patient reports  vaginal bleeding as none. Patient describes fluid per vagina as none.  Vitals:  Blood pressure (!) 110/56, pulse 88, temperature 98.1 F (36.7 C), temperature source Oral, resp. rate 17, weight (!) 146.6 kg, last menstrual period 04/29/2018, SpO2 100 %.  Patient Vitals for the past 24 hrs:  BP Temp Temp src Pulse Resp SpO2  12/09/18 0429 (!) 110/56 98.1 F (36.7 C) Oral 88 17 100 %  12/08/18 2301 133/70 98.7 F (37.1 C) - 86 16 100 %  12/08/18 1532 120/69 98 F (36.7 C) Oral 79 - 100 %  12/08/18 1334 116/81 - - 92 18 -  12/08/18 0754 138/76 98.1 F (36.7 C) Oral 87 18 100 %    Physical Examination: General appearance - alert, well appearing, and in no distress Chest - normal effort Abdomen - gravid, non-tender Fundal Height:  size equals dates Extremities: Homans sign is negative, no sign of DVT  Membranes:intact  Fetal Monitoring:  Baseline: 140 bpm, Variability: Moderate {> 6 bpm), Accelerations: Reactive and Decelerations: Absent  Imaging: [place] Mfm Fetal Bpp Wo Non Stress  Result Date: 12/08/2018 ----------------------------------------------------------------------  OBSTETRICS REPORT                       (Signed Final 12/08/2018 05:52 pm) ---------------------------------------------------------------------- Patient Info  ID #:       [id number]                           D.O.B.:  [birth year] (28 yrs)  Name:       Janice Taylor                  Visit Date: 12/08/2018 11:57 am ---------------------------------------------------------------------- Performed By  Performed By:     Hubert Azure          Ref. Address:      [facility]                    RDMS                                                              OB/GYN                                                              [address]  [place]                                                              [address]  Attending:        Sander Nephew      [facility]                    MD                                        [place]  Referred By:      Roma Schanz CNM ---------------------------------------------------------------------- Orders   #  Description                          Code         Ordered By   1  [place] MFM FETAL BPP WO NON              76819.01     [facility] ANYANWU      STRESS  ----------------------------------------------------------------------   #  Order #                    Accession #                 Episode #   1  [id number]                  [id number]                  [id number]  ---------------------------------------------------------------------- Indications   Small for gestational age fetus affecting      O64.5990   management of mother   Maternal morbid obesity                        O99.210 E66.01   Abnormal biochemical screen for spina bifida   O28.9   Gestational hypertension without significant   O13.3   proteinuria, third trimester   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   management of mother in third trimester   [redacted] weeks gestation of pregnancy                Z3A.31  ---------------------------------------------------------------------- Vital Signs                                                 Height:        5'4"  ---------------------------------------------------------------------- Fetal Evaluation  Num Of Fetuses:          1  Fetal Heart Rate(bpm):   159  Cardiac Activity:        Observed  Presentation:            Cephalic  Placenta:                Anterior  Amniotic Fluid  AFI FV:      Within normal limits  AFI Sum(cm)     %Tile       Largest Pocket(cm)  10.77           22          5.45  RUQ(cm)       RLQ(cm)       LUQ(cm)        LLQ(cm)  5.45          0.7           2.9            1.72  Comment:    Stomach and bladder noted. ---------------------------------------------------------------------- Biophysical Evaluation  Amniotic F.V:   Within normal limits       F. Tone:         Observed  F. Movement:    Observed                   Score:           8/8  F. Breathing:   Observed ---------------------------------------------------------------------- OB History  Gravidity:    3         Term:   1        Prem:   0        SAB:   1  TOP:          0       Ectopic:  0        Living: 1 ---------------------------------------------------------------------- Gestational Age  LMP:           31w 6d        Date:  04/29/18                 EDD:   02/03/19  Best:          31w 6d     Det. By:  LMP  (04/29/18)          EDD:   02/03/19 ---------------------------------------------------------------------- Impression  Biophysical profile 8/8  UA Dopplers previously AEDF  Known IUGR ---------------------------------------------------------------------- Recommendations  Management per inpatient team ----------------------------------------------------------------------               Sander Nephew, MD Electronically Signed Final Report   12/08/2018 05:52 pm ----------------------------------------------------------------------   Medications:  Scheduled . citalopram  10 mg Oral Daily  . docusate sodium  100 mg Oral Daily  . prenatal multivitamin  1 tablet Oral Q1200   I have reviewed the patient's current  medications.  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 Next study ordered for 12/11/18. FHR tracing with some accelerations but variable decelerations, will continue NST BID. Stable BP since admission, no other severe features, continue to monitor. Continue anxiety medications Routine antenatal care for now.  Verita Schneiders, MD 12/09/2018,7:23 AM

## 2018-12-10 DIAGNOSIS — O133 Gestational [pregnancy-induced] hypertension without significant proteinuria, third trimester: Secondary | ICD-10-CM

## 2018-12-10 DIAGNOSIS — O0993 Supervision of high risk pregnancy, unspecified, third trimester: Secondary | ICD-10-CM

## 2018-12-10 MED ORDER — CYCLOBENZAPRINE HCL 10 MG PO TABS
10.0000 mg | ORAL_TABLET | Freq: Three times a day (TID) | ORAL | Status: DC | PRN
  Administered 2018-12-10 – 2018-12-11 (×2): 10 mg via ORAL
  Filled 2018-12-10 (×3): qty 1

## 2018-12-10 MED ORDER — PROMETHAZINE HCL 25 MG PO TABS
25.0000 mg | ORAL_TABLET | Freq: Four times a day (QID) | ORAL | Status: DC | PRN
  Administered 2018-12-10 – 2018-12-12 (×3): 25 mg via ORAL
  Filled 2018-12-10 (×3): qty 1

## 2018-12-10 MED ORDER — BUTALBITAL-APAP-CAFFEINE 50-325-40 MG PO TABS
1.0000 | ORAL_TABLET | ORAL | Status: DC | PRN
  Administered 2018-12-10 – 2018-12-11 (×2): 1 via ORAL
  Filled 2018-12-10 (×3): qty 1

## 2018-12-10 NOTE — Progress Notes (Signed)
Patient ID: Janice Taylor, female   DOB: July 05, 1990, 28 y.o.   MRN: [id number] [facility]TE[facility](COMPREHENSIVE) NOTE Called to see pt due to new onset Headache. Kariah Loredo is a 28 y.o. G3P1011 at 104w1d  who is admitted for Severe FGR 5% with persistent AEDF, marginal cord insertion.  Fetal presentation is cephalic. Length of Stay:  3  Days  Subjective: Patient reports she woke up with h/a at back of neck this morning , initially resolved with tylenol, not helped by tylenol at 3 pm. No vision component , no scotoma. Patient reports the fetal movement as active. Patient reports uterine contraction  activity as none. Patient reports  vaginal bleeding as none. Patient describes fluid per vagina as None.  Vitals:  Blood pressure 128/72, pulse 81, temperature 98 F (36.7 C), temperature source Oral, resp. rate 18, weight (!) 146.6 kg, last menstrual period 04/29/2018, SpO2 98 %. Physical Examination:  General appearance - alert, well appearing, and in no distress Heart - normal rate and regular rhythm Abdomen - soft, nontender, nondistended Fundal Height:  size equals dates exam limited by obesity. Extremities: extremities normal, atraumatic, no cyanosis or edema and Homans sign is negative, no sign of DVT with DTRs 2+ bilaterally Membranes:intact  Fetal Monitoring:  Baseline: 140 bpm, Variability: Good {> 6 bpm), Accelerations: Reactive and Decelerations: Absent  Labs:  No results found for this or any previous visit (from the past 24 hour(s)). .cmet CMP Latest Ref Rng & Units 12/09/2018 12/07/2018 12/01/2018  Glucose 70 - 99 mg/dL 83 153(H) 109(H)  BUN 6 - 20 mg/dL 8 7 8   Creatinine 0.44 - 1.00 mg/dL 0.65 0.75 0.81  Sodium 135 - 145 mmol/L 135 132(L) 134(L)  Potassium 3.5 - 5.1 mmol/L 4.3 4.1 3.6  Chloride 98 - 111 mmol/L 103 100 102  CO2 22 - 32 mmol/L 23 21(L) 22  Calcium 8.9 - 10.3 mg/dL 8.7(L) 8.9 8.4(L)  Total Protein 6.5 - 8.1 g/dL 5.4(L) 5.8(L) 5.9(L)  Total Bilirubin  0.3 - 1.2 mg/dL 0.4 0.2(L) <0.1(L)  Alkaline Phos 38 - 126 U/L 88 101 93  AST 15 - 41 U/L 14(L) 16 17  ALT 0 - 44 U/L 16 16 20     Imaging Studies:  From 12/07/2018   Currently EPIC will not allow sonographic studies to automatically populate into notes.  In the meantime, copy and paste results into note or free text.  Medications:  Scheduled . citalopram  10 mg Oral Daily  . docusate sodium  100 mg Oral Daily  . prenatal multivitamin  1 tablet Oral Q1200   I have reviewed the patient's current medications.  ASSESSMENT: tension type headache. Patient Active Problem List   Diagnosis Date Noted  . Supervision of high risk pregnancy, antepartum 07/05/2018    Priority: Low  . IUGR (intrauterine growth restriction) affecting care of mother, third trimester 12/08/2018  . Low amniotic fluid, third trimester, fetus 1 12/08/2018  . Generalized anxiety disorder 12/02/2018  . Marginal insertion of umbilical cord affecting management of mother 11/27/2018  . Anxiety 11/07/2018  . Gestational hypertension 11/06/2018  . Abnormal chromosomal and genetic finding on antenatal screening mother 09/26/2018  . Maternal morbid obesity with BMI 50, antepartum ([place]) 01/05/2013  . 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  Jonnie Kind 12/10/2018,4:18 PM

## 2018-12-10 NOTE — Progress Notes (Signed)
Patient ID: Janice Taylor, female   DOB: Aug 25, 1990, 28 y.o.   MRN: [id number] [place]) NOTE  Janice Taylor is a 28 y.o. G3P1011 at [redacted]w[redacted]d by best clinical estimate who is admitted for [facility] with concern about possible severe BP, severe IUGR, persistent AEDF, low amniotic fluid, marginal cord insertion. Fetal presentation is cephalic. Length of Stay:  3  Days  Subjective: No complaints. Patient denies any headaches, visual symptoms, RUQ/epigastric pain or other concerning symptoms. Patient reports the fetal movement as active. Patient reports uterine contraction  activity as none. Patient reports  vaginal bleeding as none. Patient describes fluid per vagina as none.  Vitals:  Blood pressure (!) 108/52, pulse 89, temperature 97.7 F (36.5 C), temperature source Oral, resp. rate 17, weight (!) 146.6 kg, last menstrual period 04/29/2018, SpO2 98 %.  Patient Vitals for the past 24 hrs:  BP Temp Temp src Pulse Resp SpO2  12/10/18 0650 (!) 108/52 97.7 F (36.5 C) Oral 89 17 98 %  12/09/18 2035 (!) 145/80 98.6 F (37 C) Oral 90 17 99 %  12/09/18 1625 135/74 98.5 F (36.9 C) Oral 93 17 98 %  12/09/18 1210 135/81 98.4 F (36.9 C) Oral 89 17 100 %  12/09/18 0948 126/62 98.2 F (36.8 C) Oral 92 18 100 %    Physical Examination: General appearance - alert, well appearing, and in no distress Chest - normal effort Abdomen - gravid, non-tender Fundal Height:  size equals dates Extremities: Homans sign is negative, no sign of DVT  Membranes:intact  Fetal Monitoring:  Baseline: 150 bpm, Variability: Moderate {> 6 bpm), Accelerations: Reactive and Decelerations: Absent  Imaging: US Fetal Bpp W/o Non Stress  Result Date: 11/29/2018 FOLLOW UP SONOGRAM Janice Taylor is in the office for a follow up sonogram for BPP and cord dopplers. She is a 28 y.o. year old G57P1011 with Estimated Date of Delivery: 02/03/19 by LMP now at  [redacted]w[redacted]d weeks gestation. Thus far the pregnancy  has been complicated by GHTN,obesity,abnormal chromosomal screening,IUGR. GESTATION: SINGLETON PRESENTATION: cephalic FETAL ACTIVITY:          Heart rate         164          The fetus is active. AMNIOTIC FLUID: The amniotic fluid volume is  normal, 12 cm. PLACENTA LOCALIZATION:  anterior GRADE 3 CERVIX: Limited view ADNEXA: wnl GESTATIONAL AGE AND  BIOMETRICS: Gestational criteria: Estimated Date of Delivery: 02/03/19 by LMP now at [redacted]w[redacted]d Previous Scans:9 BIOPHYSICAL PROFILE:                                                                                                      COMMENTS GROSS BODY MOVEMENT                 2  TONE                2  RESPIRATIONS                2  AMNIOTIC FLUID  2                                                          SCORE:  8/8 (Note: NST was not performed as part of this antepartum testing) DOPPLER FLOW STUDIES: UMBILICAL ARTERY RI RATIOS:   elevated UAD with absent end diastolic flow,RI 4.65,6.81,.27=51.70% ANATOMICAL SURVEY                                                                            COMMENTS     CHOROID PLEXUS yes normal              ORBITS yes normal  NASAL BONE yes normal      FACIAL PROFILE yes normal  4 CHAMBERED HEART yes normal      DIAPHRAGM yes normal  STOMACH yes normal  RENAL REGION yes normal  BLADDER yes normal      3 VESSEL CORD yes normal              GENITALIA   female     SUSPECTED ABNORMALITIES:  no QUALITY OF SCAN: satisfactory TECHNICIAN COMMENTS: [place] 01+7 wks,cephalic,anterior placenta gr 3,fhr 164 bpm,BPP 8/8,AFI 12 cm,elevated UAD with absent end diastolic flow,RI 4.94,4.96,.75=91.63%,WGYKZLD view because of pt body habitus A copy of this report including all images has been saved and backed up to a second source for retrieval if needed. All measures and details of the anatomical scan, placentation, fluid volume and pelvic anatomy are contained in that report. Janice Taylor 11/29/2018 3:47 PM Clinical Impression and recommendations: I  have reviewed the sonogram results above, combined with the patient's current clinical course, below are my impressions and any appropriate recommendations for management based on the sonographic findings. 1.  [id number] Estimated Date of Delivery: 02/03/19 by serial sonographic evaluations 2.  Fetal sonographic surveillance findings: a). Normal fluid volume b). Normal antepartum fetal assessment with BPP 8/8 c). Elevated fetal Doppler ratio, >99%, with consistently absent EDF 3.  Normal general sonographic findings Will discuss patient with Dr Donalee Citrin regarding management plan going forward, given the absent EDF, no reverse flow, Has received betamethasone Janice Taylor 11/29/2018 9:03 PM   [place] Ua Cord Doppler  Result Date: 11/29/2018 FOLLOW UP SONOGRAM Janice Taylor is in the office for a follow up sonogram for BPP and cord dopplers. She is a 28 y.o. year old G65P1011 with Estimated Date of Delivery: 02/03/19 by LMP now at  [redacted]w[redacted]d weeks gestation. Thus far the pregnancy has been complicated by GHTN,obesity,abnormal chromosomal screening,IUGR. GESTATION: SINGLETON PRESENTATION: cephalic FETAL ACTIVITY:          Heart rate         164          The fetus is active. AMNIOTIC FLUID: The amniotic fluid volume is  normal, 12 cm. PLACENTA LOCALIZATION:  anterior GRADE 3 CERVIX: Limited view ADNEXA: wnl GESTATIONAL AGE AND  BIOMETRICS: Gestational criteria: Estimated Date of Delivery: 02/03/19 by LMP now at [redacted]w[redacted]d Previous Scans:9 BIOPHYSICAL PROFILE:  COMMENTS GROSS BODY MOVEMENT                 2  TONE                2  RESPIRATIONS                2  AMNIOTIC FLUID                2                                                          SCORE:  8/8 (Note: NST was not performed as part of this antepartum testing) DOPPLER FLOW STUDIES: UMBILICAL ARTERY RI RATIOS:   elevated UAD with absent end diastolic flow,RI  4.40,1.02,.72=53.66% ANATOMICAL SURVEY                                                                            COMMENTS     CHOROID PLEXUS yes normal              ORBITS yes normal  NASAL BONE yes normal      FACIAL PROFILE yes normal  4 CHAMBERED HEART yes normal      DIAPHRAGM yes normal  STOMACH yes normal  RENAL REGION yes normal  BLADDER yes normal      3 VESSEL CORD yes normal              GENITALIA   female     SUSPECTED ABNORMALITIES:  no QUALITY OF SCAN: satisfactory TECHNICIAN COMMENTS: [place] 44+0 wks,cephalic,anterior placenta gr 3,fhr 164 bpm,BPP 8/8,AFI 12 cm,elevated UAD with absent end diastolic flow,RI 3.47,4.25,.95=63.87%,FIEPPIR view because of pt body habitus A copy of this report including all images has been saved and backed up to a second source for retrieval if needed. All measures and details of the anatomical scan, placentation, fluid volume and pelvic anatomy are contained in that report. Janice Taylor 11/29/2018 3:47 PM Clinical Impression and recommendations: I have reviewed the sonogram results above, combined with the patient's current clinical course, below are my impressions and any appropriate recommendations for management based on the sonographic findings. 1.  [id number] Estimated Date of Delivery: 02/03/19 by serial sonographic evaluations 2.  Fetal sonographic surveillance findings: a). Normal fluid volume b). Normal antepartum fetal assessment with BPP 8/8 c). Elevated fetal Doppler ratio, >99%, with consistently absent EDF 3.  Normal general sonographic findings Will discuss patient with Dr Donalee Citrin regarding management plan going forward, given the absent EDF, no reverse flow, Has received betamethasone Janice Taylor 11/29/2018 9:03 PM   [place] Mfm Fetal Bpp W/nonstress  Result Date: 12/07/2018 ----------------------------------------------------------------------  OBSTETRICS REPORT                       (Signed Final 12/07/2018 04:30 pm)  ---------------------------------------------------------------------- Patient Info  ID #:       [id number]  D.O.B.:  16-Jul-1990 (28 yrs)  Name:       Janice Taylor                  Visit Date: 12/07/2018 02:51 pm ---------------------------------------------------------------------- Performed By  Performed By:     Berlinda Last          Ref. Address:     [facility]                    RDMS                                                             OB/GYN                                                             [address]                                                             [place]                                                             [place]  Attending:        Sander Nephew      Location[facility]                    MD                                       [facility]  Referred By:      Roma Schanz CNM ---------------------------------------------------------------------- Orders   #  Description                          Code         Ordered By   1  [place] MFM FETAL BPP                     38250.5      RAVI SHANKAR      W/NONSTRESS   2  [place] MFM UA CORD DOPPLER               39767.34     RAVI SHANKAR  ----------------------------------------------------------------------   #  Order #                    Accession #  Episode #   1  [id number]                  [id number]                  [id number]   2  [id number]                  [id number]                  [id number]  ---------------------------------------------------------------------- Indications   Small for gestational age fetus affecting      O57.5990   management of mother   [redacted] weeks gestation of pregnancy                Z3A.31   Maternal morbid obesity                        O99.210 E66.01   Abnormal biochemical screen for spina bifida   O28.9   Gestational hypertension without significant   O13.3   proteinuria, third trimester   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   management of mother in third trimester  ---------------------------------------------------------------------- Vital Signs                                                 Height:        5'4" ---------------------------------------------------------------------- Fetal Evaluation  Num Of Fetuses:         1  Fetal Heart Rate(bpm):  148  Cardiac Activity:       Observed  Presentation:           Cephalic  Amniotic Fluid  AFI FV:      Within normal limits  AFI Sum(cm)     %Tile       Largest Pocket(cm)  6.56            < 3         2.65  RUQ(cm)       RLQ(cm)       LUQ(cm)        LLQ(cm)  2.65          1.26          1.44           1.21 ---------------------------------------------------------------------- Biophysical Evaluation  Amniotic F.V:   Pocket => 2 cm two         F. Tone:        Observed                  planes  F. Movement:    Not Observed               Score:          6/8  F. Breathing:   Observed ---------------------------------------------------------------------- OB History  Gravidity:    3         Term:   1        Prem:   0        SAB:   1  TOP:          0       Ectopic:  0        Living: 1 ---------------------------------------------------------------------- Gestational Age  LMP:           31w 5d  Date:  04/29/18                 EDD:   02/03/19  Best:          31w 5d     Det. By:  LMP  (04/29/18)          EDD:   02/03/19 ---------------------------------------------------------------------- Anatomy  Stomach:               Appears normal, left   Bladder:                Appears normal                         sided ---------------------------------------------------------------------- Doppler - Fetal Vessels  Umbilical Artery   S/D     %tile                                            ADFV    RDFV   8.2    > 97.5                                              Yes      No ---------------------------------------------------------------------- Impression  Gestational HTN  Known IUGR  with EFW 5th%.  UA Dopplers- AEDF  BPP 6/8 (-2  Tone) The NST was attempted but unreadable.  Prior study reported amniotic fluid 16 cm however after  reevaluation the fluid was some what less 11 cm. Ms.  Taylor AFV today measures  6 cm which is decreased but  objectively does not meet the criteria for oligohydramnios.  S/P BMZ ---------------------------------------------------------------------- Recommendations  TO MAU For further monitoring.  AFI is scheduled to be repeated in 24 hours if abnormal  consider admission to L&D for hydration and or possible  delivery if oligohydramnios persist.  If resolved delivery by 34 weeks. ----------------------------------------------------------------------               Sander Nephew, MD Electronically Signed Final Report   12/07/2018 04:30 pm ----------------------------------------------------------------------  [place] Mfm Fetal Bpp Wo Non Stress  Result Date: 12/08/2018 ----------------------------------------------------------------------  OBSTETRICS REPORT                       (Signed Final 12/08/2018 05:52 pm) ---------------------------------------------------------------------- Patient Info  ID #:       [id number]                          D.O.B.:  Aug 26, 1990 (28 yrs)  Name:       Janice Taylor                  Visit Date: 12/08/2018 11:57 am ---------------------------------------------------------------------- Performed By  Performed By:     Hubert Azure          Ref. Address:      [facility]                    RDMS  OB/GYN                                                              [place]                                                              [id number]  Attending:        Sander Nephew      [facility]                    MD                                        [place]  Referred By:       Roma Schanz CNM ---------------------------------------------------------------------- Orders   #  Description                          Code         Ordered By   1  [place] MFM FETAL BPP WO NON              76819.01     UGONNA ANYANWU      STRESS  ----------------------------------------------------------------------   #  Order #                    Accession #                 Episode #   1  [id number]                  [id number]                  [id number]  ---------------------------------------------------------------------- Indications   Small for gestational age fetus affecting      O16.5990   management of mother   Maternal morbid obesity                        O99.210 E66.01   Abnormal biochemical screen for spina bifida   O28.9   Gestational hypertension without significant   O13.3   proteinuria, third trimester   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   management of mother in third trimester   [redacted] weeks gestation of pregnancy                Z3A.31  ---------------------------------------------------------------------- Vital Signs  Height:        5'4" ---------------------------------------------------------------------- Fetal Evaluation  Num Of Fetuses:          1  Fetal Heart Rate(bpm):   159  Cardiac Activity:        Observed  Presentation:            Cephalic  Placenta:                Anterior  Amniotic Fluid  AFI FV:      Within normal limits  AFI Sum(cm)     %Tile       Largest Pocket(cm)  10.77           22          5.45  RUQ(cm)       RLQ(cm)       LUQ(cm)        LLQ(cm)  5.45          0.7           2.9            1.72  Comment:    Stomach and bladder noted. ---------------------------------------------------------------------- Biophysical Evaluation  Amniotic F.V:   Within normal limits       F. Tone:         Observed  F. Movement:    Observed                   Score:           8/8  F. Breathing:   Observed  ---------------------------------------------------------------------- OB History  Gravidity:    3         Term:   1        Prem:   0        SAB:   1  TOP:          0       Ectopic:  0        Living: 1 ---------------------------------------------------------------------- Gestational Age  LMP:           31w 6d        Date:  04/29/18                 EDD:   02/03/19  Best:          31w 6d     Det. By:  LMP  (04/29/18)          EDD:   02/03/19 ---------------------------------------------------------------------- Impression  Biophysical profile 8/8  UA Dopplers previously AEDF  Known IUGR ---------------------------------------------------------------------- Recommendations  Management per inpatient team ----------------------------------------------------------------------               Sander Nephew, MD Electronically Signed Final Report   12/08/2018 05:52 pm ----------------------------------------------------------------------  [place] Mfm Fetal Bpp Wo Non Stress  Result Date: 12/04/2018 ----------------------------------------------------------------------  OBSTETRICS REPORT                       (Signed Final 12/04/2018 09:04 am) ---------------------------------------------------------------------- Patient Info  ID #:       [id number]                          D.O.B.:  [birth year] (28 yrs)  Name:       Janice Taylor                  Visit Date: 12/04/2018 07:13 am ---------------------------------------------------------------------- Performed By  Performed By:  Enriqueta Shutter           Ref. Address:     [facility]                    RDMS, RVT                                                             OB/GYN                                                             [place]                                                             [place]  Attending:        Tama High MD        Location[facility]                                                              [place]  Referred By:      Roma Schanz CNM ---------------------------------------------------------------------- Orders   #  Description                          Code         Ordered By   1  [place] MFM UA CORD DOPPLER               76820.02     CHARLIE PICKENS   2  [place] MFM FETAL BPP WO NON              37169.67     CHARLIE Reynolds  ----------------------------------------------------------------------   #  Order #                    Accession #                 Episode #   1  [id number]                  [id number]  [id number]   2  [id number]                  [id number]                  [id number]  ---------------------------------------------------------------------- Indications   Encounter for other antenatal screening        Z36.2   follow-up (Positive Integrated for OSB 1:10)   Maternal morbid obesity                        O99.210 E66.01   Abnormal biochemical screen for spina bifida   O28.9   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   management of mother in second trimester   (fundal)   Gestational hypertension without significant   O13.3   proteinuria, third trimester   Maternal care for known or suspected poor      O36.5931   fetal growth, third trimester, fetus 1   [redacted] weeks gestation of pregnancy                Z3A.31  ---------------------------------------------------------------------- Vital Signs                                                 Height:        5'4" ---------------------------------------------------------------------- Fetal Evaluation  Num Of Fetuses:         1  Fetal Heart Rate(bpm):  135  Cardiac Activity:       Observed  Presentation:           Cephalic  Amniotic Fluid  AFI FV:      Within normal limits  AFI Sum(cm)     %Tile       Largest Pocket(cm)  16.07           58          7.23  RUQ(cm)       RLQ(cm)       LUQ(cm)        LLQ(cm)  7.23          1.73          4.69            2.41 ---------------------------------------------------------------------- Biophysical Evaluation  Amniotic F.V:   Pocket => 2 cm two         F. Tone:        Observed                  planes  F. Movement:    Observed                   Score:          8/8  F. Breathing:   Observed ---------------------------------------------------------------------- OB History  Gravidity:    3         Term:   1        Prem:   0        SAB:   1  TOP:          0       Ectopic:  0        Living: 1 ---------------------------------------------------------------------- Gestational Age  LMP:           31w 2d        Date:  04/29/18  EDD:   02/03/19  Best:          31w 2d     Det. By:  LMP  (04/29/18)          EDD:   02/03/19 ---------------------------------------------------------------------- Doppler - Fetal Vessels  Umbilical Artery                                                            ADFV    RDFV                                                              Yes      No ---------------------------------------------------------------------- Impression  Fetal growth restriction. Her pregnancy was dated by 36-  week ultrasound.  Patient was admitted with fetal growth restriction with  abnormal Doppler studies. She has gestational hypertension.  Amniotic fluid is normal and good fetal activity is seen.  Antenatal testing is reassuring. BPP 8/8.  Umbilical artery Doppler study showed persistent absent-end-  diastolic flow. ---------------------------------------------------------------------- Recommendations  -Continue twice-weekly BPP and UA Doppler studies.  -Delivery decision to be dictated by fetal and maternal status.  -Provided antenatal testing remains reassuring (stable),  patient should be delivered at 34 weeks' gestation (absent  end-diastolic flow on the UA Doppler). ----------------------------------------------------------------------                  Tama High, MD Electronically Signed Final Report    12/04/2018 09:04 am ----------------------------------------------------------------------  [place] Mfm Fetal Bpp Wo Non Stress  Result Date: 11/27/2018 ----------------------------------------------------------------------  OBSTETRICS REPORT                       (Signed Final 11/27/2018 01:22 pm) ---------------------------------------------------------------------- Patient Info  ID #:       [id number]                          D.O.B.:  1990/06/25 (28 yrs)  Name:       Janice Taylor                  Visit Date: 11/27/2018 12:21 pm ---------------------------------------------------------------------- Performed By  Performed By:     Valda Favia          Ref. Address:     [place]                                                             OB/GYN  [place]                                                             [place]  Attending:        Tama High MD        [facility]                                                             [facility]  Referred By:      Roma Schanz CNM ---------------------------------------------------------------------- Orders   #  Description                          Code         Ordered By   1  [place] MFM FETAL BPP WO NON              76819.01     RAVI SHANKAR      STRESS   2  [place] MFM UA CORD DOPPLER               76820.02     RAVI [facility]  ----------------------------------------------------------------------   #  Order #                    Accession #                 Episode #   1  [id number]                  [id number]   2  [id number]                  [id number]                  [id number]  ---------------------------------------------------------------------- Indications   [redacted] weeks gestation of pregnancy                Z3A.30   Encounter for other  antenatal screening        Z36.2   follow-up (Positive Integrated for OSB 1:10)   Maternal morbid obesity                        O99.210 E66.01   Abnormal biochemical screen for spina bifida   O28.9   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   management of mother in second trimester   (fundal)  Gestational hypertension without significant   O13.3   proteinuria, third trimester   Maternal care for known or suspected poor      O36.5931   fetal growth, third trimester, fetus 1  ---------------------------------------------------------------------- Vital Signs                                                 Height:        5'4" ---------------------------------------------------------------------- Fetal Evaluation  Num Of Fetuses:         1  Fetal Heart Rate(bpm):  129  Cardiac Activity:       Observed  Presentation:           [place]  Amniotic Fluid  AFI FV:      Within normal limits  AFI Sum(cm)     %Tile       Largest Pocket(cm)  13.5            42          6.61  RUQ(cm)       RLQ(cm)       LUQ(cm)        LLQ(cm)  2.86          6.61          0              4.03 ---------------------------------------------------------------------- Biophysical Evaluation  Amniotic F.V:   Within normal limits       F. Tone:        Observed  F. Movement:    Observed                   Score:          8/8  F. Breathing:   Observed ---------------------------------------------------------------------- OB History  Gravidity:    3         Term:   1        Prem:   0        SAB:   1  TOP:          0       Ectopic:  0        Living: 1 ---------------------------------------------------------------------- Gestational Age  LMP:           30w 2d        Date:  04/29/18                 EDD:   02/03/19  Best:          Weston Settle 2d     Det. By:  LMP  (04/29/18)          EDD:   02/03/19 ---------------------------------------------------------------------- Doppler - Fetal Vessels  Umbilical Artery   S/D     %tile     RI              PI                      ADFV    RDFV  9.03    > 97.5  0.89             2.01                         Y       N ---------------------------------------------------------------------- Impression  Janice Taylor, [employer]  returned for antenatal testing. She has  gestational hypertension and fetal growth restriction. We  made appointments for her to come twice-weekly for  antenatal testing.  She does not have symptoms of severe headache or visual  disturbances or vaginal bleeding. She c/o intermittent right  upper quadrant pain.  BP at our office: 145/69 and 164/85 mm Hg.  On ultrasound, amniotic fluid is normal and good fetal activity  is seen. Umbilical artery Doppler showed intermittent absent  end-diastolic flow. BPP 8/8.  Breech presentation.  I recommended evaluation at the MAU because of her  symptoms and hypertension.  Patient will be going over to the MAU. Discussed with Dr.  Roselie Awkward. ---------------------------------------------------------------------- Recommendations  -MAU evaluation.  -Betamethasone to be given.  -Patient has follow-up appointments. ----------------------------------------------------------------------                  Tama High, MD Electronically Signed Final Report   11/27/2018 01:22 pm ----------------------------------------------------------------------  [place] Mfm Fetal Bpp Wo Non Stress  Result Date: 11/23/2018 ----------------------------------------------------------------------  OBSTETRICS REPORT                       (Signed Final 11/23/2018 05:01 pm) ---------------------------------------------------------------------- Patient Info  ID #:       [id number]                          D.O.B.:  07/12/1990 (28 yrs)  Name:       Janice Taylor                  Visit Date: 11/23/2018 03:28 pm ---------------------------------------------------------------------- Performed By  Performed By:     Ellin Saba          Ref. Address:     [facility]                    RDMS                                                              OB/GYN                                                             [address]                                                             [place]                                                             [place]  Attending:        Tama High MD        [facility]  Referred By:      Roma Schanz CNM ---------------------------------------------------------------------- Orders   #  Description                          Code         Ordered By   1  [place] MFM OB FOLLOW UP                  76816.01     RAVI SHANKAR   2  [place] MFM FETAL BPP WO NON              76819.01     KIMBERLY BOOKER      STRESS   3  [place] MFM UA CORD DOPPLER               76820.02     [facility] BOOKER  ----------------------------------------------------------------------   #  Order #                    Accession #                 Episode #   1  [id number]                  [id number]                  [id number]   2  [id number]                  [id number]                  [id number]   3  [id number]                  [id number]                  [id number]  ---------------------------------------------------------------------- Indications   [redacted] weeks gestation of pregnancy                Z3A.29   Encounter for other antenatal screening        Z36.2   follow-up (Positive Integrated for OSB 1:10)   Maternal morbid obesity                        O99.210 E66.01   Abnormal biochemical screen for spina bifida   O28.9   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   management of mother in second trimester   (fundal)   Gestational hypertension without significant   O13.3   proteinuria, third trimester   Maternal care for known or suspected poor      O36.5931   fetal growth, third trimester, fetus 1  ---------------------------------------------------------------------- Vital Signs  Weight (lb): 363                                Height:  5'4"  BMI:         62.3 ---------------------------------------------------------------------- Fetal Evaluation  Num Of Fetuses:         1  Fetal Heart Rate(bpm):  142  Cardiac Activity:       Observed  Presentation:           Cephalic  Placenta:               Anterior  P. Cord Insertion:      Marginal insertion  Amniotic Fluid  AFI FV:      Within normal limits  AFI Sum(cm)     %Tile       Largest Pocket(cm)  14.03           46          4.65  RUQ(cm)       RLQ(cm)       LUQ(cm)        LLQ(cm)  1.29          4.24          4.65           3.85 ---------------------------------------------------------------------- Biophysical Evaluation  Amniotic F.V:   Within normal limits       F. Tone:        Observed  F. Movement:    Observed                   Score:          8/8  F. Breathing:   Observed ---------------------------------------------------------------------- Biometry  BPD:      73.1  mm     G. Age:  29w 2d         26  %    CI:        76.85   %    70 - 86                                                          FL/HC:      18.7   %    19.2 - 21.4  HC:      264.1  mm     G. Age:  28w 5d          4  %    HC/AC:      1.07        0.99 - 1.21  AC:      247.6  mm     G. Age:  29w 0d         23  %    FL/BPD:     67.7   %    71 - 87  FL:       49.5  mm     G. Age:  26w 5d        < 1  %    FL/AC:      20.0   %    20 - 24  HUM:      46.1  mm     G. Age:  27w 1d        < 5  %  LV:          5  mm  ULN:      48.2  mm     G. Age:  30w 5d         55  %  TIB:      43.8  mm     G. Age:  27w 0d        < 5  %  RAD:      40.1  mm     G. Age:  28w 0d         36  %  FIB:      44.2  mm     G. Age:  27w 1d         37  %  Est. FW:    1194  gm    2 lb 10 oz       5  % ---------------------------------------------------------------------- OB History  Gravidity:    3         Term:   1        Prem:   0        SAB:   1  TOP:          0       Ectopic:  0        Living: 1  ---------------------------------------------------------------------- Gestational Age  LMP:           29w 5d        Date:  04/29/18                 EDD:   02/03/19  U/S Today:     28w 3d                                        EDD:   02/12/19  Best:          29w 5d     Det. By:  LMP  (04/29/18)          EDD:   02/03/19 ---------------------------------------------------------------------- Anatomy  Cranium:               Appears normal         LVOT:                   Not well visualized  Cavum:                 Appears normal         Aortic Arch:            Not well visualized  Ventricles:            Appears normal         Ductal Arch:            Not well visualized  Choroid Plexus:        Appears normal         Diaphragm:              Appears normal  Cerebellum:            Appears normal         Stomach:                Appears normal, left  sided  Posterior Fossa:       Appears normal         Abdomen:                Previously seen  Nuchal Fold:           Not applicable (>16    Abdominal Wall:         Previously seen                         wks GA)  Face:                  Not well visualized    Cord Vessels:           Previously seen  Lips:                  Appears normal         Kidneys:                Previously seen  Palate:                Not well visualized    Bladder:                Appears normal  Thoracic:              Appears normal         Spine:                  Not well visualized  Heart:                 Not well visualized    Upper Extremities:      Previously seen  RVOT:                  Not well visualized    Lower Extremities:      Previously seen  Other:  Technically difficult due to maternal habitus and fetal position. ---------------------------------------------------------------------- Doppler - Fetal Vessels  Umbilical Artery                                                            ADFV    RDFV                                                               Yes      No ---------------------------------------------------------------------- Cervix Uterus Adnexa  Cervix  Not visualized (advanced GA >24wks)  Left Ovary  Not visualized.  Right Ovary  Not visualized.  Adnexa  No abnormality visualized. ---------------------------------------------------------------------- Impression  Janice Taylor returned for fetal growth assessment.  High-risk problems include: 1) increased maternal serum AFP  (20.53 MoM) on mid-trimester screening, 2) marginal cord  insertion, 3) recent diagnosis of gestational hypertension.  Patient feels well without symptoms of severe features of  preeclampsia.  On ultrasound, the estimated fetal weight is at the 5th  percentile. Head circumference measurement is at between  mean and -1 SD (normal). Amniotic fluid is normal and good  fetal activity  is seen. BPP 8/8.  Umbilical artery Doppler showed persistent absent-end-  diastolic flow. Marginal cord insertion is seen again.  I counseled the patient on the following:  -Explained the diagnosis of severe fetal growth restriction,  fetal weight percentile, and abnormal umbilical artery Doppler  study. Placental insufficiency is the most-likely cause.  Increased MSAFP seen in the second trimester is probably a  reflection of placental insufficiency. Gestational hypertension  is also from placental cause.  -Frequent monitoring is recommended (twice weekly) till  delivery.  -Discussed the benefit of antenatal corticosteroids.  -Delivery at 34 weeks would be recommended if UA Doppler  shows persistent AEDF or earlier if reversed-flow is seen.  We made appointments to have twice-weekly [facility]en[facility]  [facility]. If BPP and  Doppler studies can be performed at your office, we would  recommend once-weekly testing here at the [facility] to avoid  patient inconvenience. ---------------------------------------------------------------------- Recommendations   -BPP, UA Doppler twice weekly.  -NST once weekly  -Betamethasone on 2 consecutive days (patient will decide  on the dates). ----------------------------------------------------------------------                  Tama High, MD Electronically Signed Final Report   11/23/2018 05:01 pm ----------------------------------------------------------------------  [place] Mfm Ob Follow Up  Result Date: 11/23/2018 ----------------------------------------------------------------------  OBSTETRICS REPORT                       (Signed Final 11/23/2018 05:01 pm) ---------------------------------------------------------------------- Patient Info  ID #:       [id number]                          D.O.B.:  December 31, 1990 (28 yrs)  Name:       Janice Taylor                  Visit Date: 11/23/2018 03:28 pm ---------------------------------------------------------------------- Performed By  Performed By:     Ellin Saba          Ref. Address:     [facility]                    RDMS                                                             OB/GYN                                                             [place]  [place]  Attending:        Tama High MD        Location[facility]  Referred By:      Roma Schanz CNM ---------------------------------------------------------------------- Orders   #  Description                          Code         Ordered By   1  [place] MFM OB FOLLOW UP                  76816.01     RAVI SHANKAR   2  [place] MFM FETAL BPP WO NON              76819.01     KIMBERLY BOOKER      STRESS   3  [place] MFM UA CORD DOPPLER               76820.02     [facility] BOOKER  ----------------------------------------------------------------------   #  Order #                    Accession #                  Episode #   1  [id number]                  [id number]   2  [id number]                  [id number]                  [id number]   3  [id number]                  [id number]                  [id number]  ---------------------------------------------------------------------- Indications   [redacted] weeks gestation of pregnancy                Z3A.29   Encounter for other antenatal screening        Z36.2   follow-up (Positive Integrated for OSB 1:10)   Maternal morbid obesity                        O99.210 E66.01   Abnormal biochemical screen for spina bifida   O28.9   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   management of mother in second trimester   (fundal)   Gestational hypertension without significant   O13.3   proteinuria, third trimester   Maternal care for known or suspected poor      O36.5931   fetal growth, third trimester, fetus 1  ---------------------------------------------------------------------- Vital Signs  Weight (lb): 363  Height:        5'4"  BMI:         62.3 ---------------------------------------------------------------------- Fetal Evaluation  Num Of Fetuses:         1  Fetal Heart Rate(bpm):  142  Cardiac Activity:       Observed  Presentation:           Cephalic  Placenta:               Anterior  P. Cord Insertion:      Marginal insertion  Amniotic Fluid  AFI FV:      Within normal limits  AFI Sum(cm)     %Tile       Largest Pocket(cm)  14.03           46          4.65  RUQ(cm)       RLQ(cm)       LUQ(cm)        LLQ(cm)  1.29          4.24          4.65           3.85 ---------------------------------------------------------------------- Biophysical Evaluation  Amniotic F.V:   Within normal limits       F. Tone:        Observed  F. Movement:    Observed                   Score:          8/8  F. Breathing:   Observed ---------------------------------------------------------------------- Biometry  BPD:      73.1  mm     G. Age:   29w 2d         26  %    CI:        76.85   %    70 - 86                                                          FL/HC:      18.7   %    19.2 - 21.4  HC:      264.1  mm     G. Age:  28w 5d          4  %    HC/AC:      1.07        0.99 - 1.21  AC:      247.6  mm     G. Age:  29w 0d         23  %    FL/BPD:     67.7   %    71 - 87  FL:       49.5  mm     G. Age:  26w 5d        < 1  %    FL/AC:      20.0   %    20 - 24  HUM:      46.1  mm     G. Age:  27w 1d        < 5  %  LV:          5  mm  ULN:      48.2  mm     G. Age:  30w 5d         55  %  TIB:      43.8  mm     G. Age:  27w 0d        < 5  %  RAD:      40.1  mm     G. Age:  28w 0d         36  %  FIB:      44.2  mm     G. Age:  27w 1d         37  %  Est. FW:    1194  gm    2 lb 10 oz       5  % ---------------------------------------------------------------------- OB History  Gravidity:    3         Term:   1        Prem:   0        SAB:   1  TOP:          0       Ectopic:  0        Living: 1 ---------------------------------------------------------------------- Gestational Age  LMP:           29w 5d        Date:  04/29/18                 EDD:   02/03/19  U/S Today:     28w 3d                                        EDD:   02/12/19  Best:          29w 5d     Det. By:  LMP  (04/29/18)          EDD:   02/03/19 ---------------------------------------------------------------------- Anatomy  Cranium:               Appears normal         LVOT:                   Not well visualized  Cavum:                 Appears normal         Aortic Arch:            Not well visualized  Ventricles:            Appears normal         Ductal Arch:            Not well visualized  Choroid Plexus:        Appears normal         Diaphragm:              Appears normal  Cerebellum:            Appears normal         Stomach:                Appears normal, left  sided  Posterior Fossa:       Appears normal         Abdomen:                 Previously seen  Nuchal Fold:           Not applicable (>06    Abdominal Wall:         Previously seen                         wks GA)  Face:                  Not well visualized    Cord Vessels:           Previously seen  Lips:                  Appears normal         Kidneys:                Previously seen  Palate:                Not well visualized    Bladder:                Appears normal  Thoracic:              Appears normal         Spine:                  Not well visualized  Heart:                 Not well visualized    Upper Extremities:      Previously seen  RVOT:                  Not well visualized    Lower Extremities:      Previously seen  Other:  Technically difficult due to maternal habitus and fetal position. ---------------------------------------------------------------------- Doppler - Fetal Vessels  Umbilical Artery                                                            ADFV    RDFV                                                              Yes      No ---------------------------------------------------------------------- Cervix Uterus Adnexa  Cervix  Not visualized (advanced GA >24wks)  Left Ovary  Not visualized.  Right Ovary  Not visualized.  Adnexa  No abnormality visualized. ---------------------------------------------------------------------- Impression  Janice Taylor returned for fetal growth assessment.  High-risk problems include: 1) increased maternal serum AFP  (20.53 MoM) on mid-trimester screening, 2) marginal cord  insertion, 3) recent diagnosis of gestational hypertension.  Patient feels well without symptoms of severe features of  preeclampsia.  On ultrasound, the estimated fetal weight is at the 5th  percentile. Head circumference measurement is at between  mean and -1 SD (normal). Amniotic fluid is normal and good  fetal activity  is seen. BPP 8/8.  Umbilical artery Doppler showed persistent absent-end-  diastolic flow. Marginal cord insertion is seen again.  I  counseled the patient on the following:  -Explained the diagnosis of severe fetal growth restriction,  fetal weight percentile, and abnormal umbilical artery Doppler  study. Placental insufficiency is the most-likely cause.  Increased MSAFP seen in the second trimester is probably a  reflection of placental insufficiency. Gestational hypertension  is also from placental cause.  -Frequent monitoring is recommended (twice weekly) till  delivery.  -Discussed the benefit of antenatal corticosteroids.  -Delivery at 34 weeks would be recommended if UA Doppler  shows persistent AEDF or earlier if reversed-flow is seen.  We made appointments to have twice-weekly antenatal  testing [facility]. If BPP and  Doppler studies can be performed at your office, we would  recommend once-weekly testing here at the [facility] to avoid  patient inconvenience. ---------------------------------------------------------------------- Recommendations  -BPP, UA Doppler twice weekly.  -NST once weekly  -Betamethasone on 2 consecutive days (patient will decide  on the dates). ----------------------------------------------------------------------                  Tama High, MD Electronically Signed Final Report   11/23/2018 05:01 pm ----------------------------------------------------------------------  [place] Mfm Ua Cord Doppler  Result Date: 12/07/2018 ----------------------------------------------------------------------  OBSTETRICS REPORT                       (Signed Final 12/07/2018 04:30 pm) ---------------------------------------------------------------------- Patient Info  ID #:       [id number]                          D.O.B.:  1990-06-11 (28 yrs)  Name:       Janice Taylor                  Visit Date: 12/07/2018 02:51 pm ---------------------------------------------------------------------- Performed By  Performed By:     Berlinda Last          Ref. Address:     [facility]                    RDMS                                                              OB/GYN                                                             [place]  [place]  Attending:        Sander Nephew      [facility]                    MD                                       [facility]  Referred By:      Roma Schanz CNM ---------------------------------------------------------------------- Orders   #  Description                          Code         Ordered By   1  [place] MFM FETAL BPP                     46568.1      RAVI [facility]      W/NONSTRESS   2  [place] MFM UA CORD DOPPLER               76820.02     [facility]  ----------------------------------------------------------------------   #  Order #                    Accession #                 Episode #   1  [id number]                  [id number]   2  [id number]                  [id number]                  [id number]  ---------------------------------------------------------------------- Indications   Small for gestational age fetus affecting      O71.5990   management of mother   [redacted] weeks gestation of pregnancy                Z3A.31   Maternal morbid obesity                        O99.210 E66.01   Abnormal biochemical screen for spina bifida   O28.9   Gestational hypertension without significant   O13.3   proteinuria, third trimester   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   management of mother in third trimester  ---------------------------------------------------------------------- Vital Signs                                                 Height:        5'4" ---------------------------------------------------------------------- Fetal Evaluation  Num Of Fetuses:         1  Fetal Heart Rate(bpm):  148  Cardiac Activity:       Observed  Presentation:           Cephalic   Amniotic Fluid  AFI FV:      Within  normal limits  AFI Sum(cm)     %Tile       Largest Pocket(cm)  6.56            < 3         2.65  RUQ(cm)       RLQ(cm)       LUQ(cm)        LLQ(cm)  2.65          1.26          1.44           1.21 ---------------------------------------------------------------------- Biophysical Evaluation  Amniotic F.V:   Pocket => 2 cm two         F. Tone:        Observed                  planes  F. Movement:    Not Observed               Score:          6/8  F. Breathing:   Observed ---------------------------------------------------------------------- OB History  Gravidity:    3         Term:   1        Prem:   0        SAB:   1  TOP:          0       Ectopic:  0        Living: 1 ---------------------------------------------------------------------- Gestational Age  LMP:           31w 5d        Date:  04/29/18                 EDD:   02/03/19  Best:          31w 5d     Det. By:  LMP  (04/29/18)          EDD:   02/03/19 ---------------------------------------------------------------------- Anatomy  Stomach:               Appears normal, left   Bladder:                Appears normal                         sided ---------------------------------------------------------------------- Doppler - Fetal Vessels  Umbilical Artery   S/D     %tile                                            ADFV    RDFV   8.2    > 97.5                                              Yes      No ---------------------------------------------------------------------- Impression  Gestational HTN  Known IUGR with EFW 5th%.  UA Dopplers- AEDF  BPP 6/8 (-2  Tone) The NST was attempted but unreadable.  Prior study reported amniotic fluid 16 cm however after  reevaluation the fluid was some what less 11 cm. Ms.  Taylor AFV today measures  6 cm which is decreased but  objectively does not meet the criteria  for oligohydramnios.  S/P BMZ ---------------------------------------------------------------------- Recommendations  TO MAU For  further monitoring.  AFI is scheduled to be repeated in 24 hours if abnormal  consider admission to L&D for hydration and or possible  delivery if oligohydramnios persist.  If resolved delivery by 34 weeks. ----------------------------------------------------------------------               Sander Nephew, MD Electronically Signed Final Report   12/07/2018 04:30 pm ----------------------------------------------------------------------  [place] Mfm Ua Cord Doppler  Result Date: 12/04/2018 ----------------------------------------------------------------------  OBSTETRICS REPORT                       (Signed Final 12/04/2018 09:04 am) ---------------------------------------------------------------------- Patient Info  ID #:       [id number]                          D.O.B.:  [birth year] (28 yrs)  Name:       Janice Taylor                  Visit Date: 12/04/2018 07:13 am ---------------------------------------------------------------------- Performed By  Performed By:     Enriqueta Shutter           Ref. Address:     [facility]                    RDMS, RVT                                                             OB/GYN                                                             [place]                                                             [place]  Attending:        Tama High MD        [facility]                                                             [place]  Referred By:      Royetta Crochet  BOOKER CNM ---------------------------------------------------------------------- Orders   #  Description                          Code         Ordered By   1  [place] MFM UA CORD DOPPLER               76820.02     CHARLIE PICKENS   2  [place] MFM FETAL BPP WO NON              76819.01     CHARLIE PICKENS      STRESS  ----------------------------------------------------------------------   #  Order #                     Accession #                 Episode #   1  [id number]                  [id number]                  [id number]   2  [id number]                  [id number]                  [id number]  ---------------------------------------------------------------------- Indications   Encounter for other antenatal screening        Z36.2   follow-up (Positive Integrated for OSB 1:10)   Maternal morbid obesity                        O99.210 E66.01   Abnormal biochemical screen for spina bifida   O28.9   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   management of mother in second trimester   (fundal)   Gestational hypertension without significant   O13.3   proteinuria, third trimester   Maternal care for known or suspected poor      O36.5931   fetal growth, third trimester, fetus 1   [redacted] weeks gestation of pregnancy                Z3A.31  ---------------------------------------------------------------------- Vital Signs                                                 Height:        5'4" ---------------------------------------------------------------------- Fetal Evaluation  Num Of Fetuses:         1  Fetal Heart Rate(bpm):  135  Cardiac Activity:       Observed  Presentation:           Cephalic  Amniotic Fluid  AFI FV:      Within normal limits  AFI Sum(cm)     %Tile       Largest Pocket(cm)  16.07           58          7.23  RUQ(cm)       RLQ(cm)       LUQ(cm)        LLQ(cm)  7.23          1.73          4.69  2.41 ---------------------------------------------------------------------- Biophysical Evaluation  Amniotic F.V:   Pocket => 2 cm two         F. Tone:        Observed                  planes  F. Movement:    Observed                   Score:          8/8  F. Breathing:   Observed ---------------------------------------------------------------------- OB History  Gravidity:    3         Term:   1        Prem:   0        SAB:   1  TOP:          0       Ectopic:  0        Living: 1  ---------------------------------------------------------------------- Gestational Age  LMP:           31w 2d        Date:  04/29/18                 EDD:   02/03/19  Best:          Burke Keels 2d     Det. By:  LMP  (04/29/18)          EDD:   02/03/19 ---------------------------------------------------------------------- Doppler - Fetal Vessels  Umbilical Artery                                                            ADFV    RDFV                                                              Yes      No ---------------------------------------------------------------------- Impression  Fetal growth restriction. Her pregnancy was dated by 65-  week ultrasound.  Patient was admitted with fetal growth restriction with  abnormal Doppler studies. She has gestational hypertension.  Amniotic fluid is normal and good fetal activity is seen.  Antenatal testing is reassuring. BPP 8/8.  Umbilical artery Doppler study showed persistent absent-end-  diastolic flow. ---------------------------------------------------------------------- Recommendations  -Continue twice-weekly BPP and UA Doppler studies.  -Delivery decision to be dictated by fetal and maternal status.  -Provided antenatal testing remains reassuring (stable),  patient should be delivered at 34 weeks' gestation (absent  end-diastolic flow on the UA Doppler). ----------------------------------------------------------------------                  Tama High, MD Electronically Signed Final Report   12/04/2018 09:04 am ----------------------------------------------------------------------  [place] Mfm Ua Cord Doppler  Result Date: 11/27/2018 ----------------------------------------------------------------------  OBSTETRICS REPORT                       (Signed Final 11/27/2018 01:22 pm) ---------------------------------------------------------------------- Patient Info  ID #:       [id number]  D.O.B.:  Oct 07, 1990 (28 yrs)  Name:       Janice Taylor                   Visit Date: 11/27/2018 12:21 pm ---------------------------------------------------------------------- Performed By  Performed By:     Valda Favia          Ref. Address:     [facility]                    RDMS                                                             OB/GYN                                                             [place]                                                             [place]  Attending:        Tama High MD        [facility]  Referred By:      Roma Schanz CNM ---------------------------------------------------------------------- Orders   #  Description                          Code         Ordered By   1  [place] MFM FETAL BPP WO NON              76819.01     RAVI SHANKAR      STRESS   2  [place] MFM UA CORD DOPPLER               69485.46     RAVI SHANKAR  ----------------------------------------------------------------------   #  Order #                    Accession #  Episode #   1  [id number]                  [id number]                  [id number]   2  [id number]                  [id number]                  [id number]  ---------------------------------------------------------------------- Indications   [redacted] weeks gestation of pregnancy                Z3A.30   Encounter for other antenatal screening        Z36.2   follow-up (Positive Integrated for OSB 1:10)   Maternal morbid obesity                        O99.210 E66.01   Abnormal biochemical screen for spina bifida   O28.9   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   management of mother in second trimester   (fundal)   Gestational hypertension without significant   O13.3   proteinuria, third trimester   Maternal care for known or suspected poor      O36.5931   fetal growth, third trimester, fetus 1   ---------------------------------------------------------------------- Vital Signs                                                 Height:        5'4" ---------------------------------------------------------------------- Fetal Evaluation  Num Of Fetuses:         1  Fetal Heart Rate(bpm):  129  Cardiac Activity:       Observed  Presentation:           Breech  Amniotic Fluid  AFI FV:      Within normal limits  AFI Sum(cm)     %Tile       Largest Pocket(cm)  13.5            42          6.61  RUQ(cm)       RLQ(cm)       LUQ(cm)        LLQ(cm)  2.86          6.61          0              4.03 ---------------------------------------------------------------------- Biophysical Evaluation  Amniotic F.V:   Within normal limits       F. Tone:        Observed  F. Movement:    Observed                   Score:          8/8  F. Breathing:   Observed ---------------------------------------------------------------------- OB History  Gravidity:    3         Term:   1        Prem:   0        SAB:   1  TOP:          0       Ectopic:  0        Living: 1 ---------------------------------------------------------------------- Gestational Age  LMP:  30w 2d        Date:  04/29/18                 EDD:   02/03/19  Best:          Weston Settle 2d     Det. By:  LMP  (04/29/18)          EDD:   02/03/19 ---------------------------------------------------------------------- Doppler - Fetal Vessels  Umbilical Artery   S/D     %tile     RI              PI                     ADFV    RDFV  9.03    > 97.5  0.89             2.01                         Y       N ---------------------------------------------------------------------- Impression  Janice Taylor returned for antenatal testing. She has  gestational hypertension and fetal growth restriction. We  made appointments for her to come twice-weekly for  antenatal testing.  She does not have symptoms of severe headache or visual  disturbances or vaginal bleeding. She c/o intermittent right  upper  quadrant pain.  BP at our office: 145/69 and 164/85 mm Hg.  On ultrasound, amniotic fluid is normal and good fetal activity  is seen. Umbilical artery Doppler showed intermittent absent  end-diastolic flow. BPP 8/8.  Breech presentation.  I recommended evaluation at the MAU because of her  symptoms and hypertension.  Patient will be going over to the MAU. Discussed with Dr.  Roselie Awkward. ---------------------------------------------------------------------- Recommendations  -MAU evaluation.  -Betamethasone to be given.  -Patient has follow-up appointments. ----------------------------------------------------------------------                  Tama High, MD Electronically Signed Final Report   11/27/2018 01:22 pm ----------------------------------------------------------------------  [place] Mfm Ua Cord Doppler  Result Date: 11/23/2018 ----------------------------------------------------------------------  OBSTETRICS REPORT                       (Signed Final 11/23/2018 05:01 pm) ---------------------------------------------------------------------- Patient Info  ID #:       [id number]                          D.O.B.:  July 01, 1990 (28 yrs)  Name:       Janice Taylor                  Visit Date: 11/23/2018 03:28 pm ---------------------------------------------------------------------- Performed By  Performed By:     Ellin Saba          Ref. Address:     [place]                                                             OB/GYN  [place]                                                             [place]  Attending:        Tama High MD        [facility]  Referred By:      Roma Schanz CNM  ---------------------------------------------------------------------- Orders   #  Description                          Code         Ordered By   1  [place] MFM OB FOLLOW UP                  76816.01     RAVI SHANKAR   2  [place] MFM FETAL BPP WO NON              76819.01     KIMBERLY BOOKER      STRESS   3  [place] MFM UA CORD DOPPLER               76820.02     [facility] BOOKER  ----------------------------------------------------------------------   #  Order #                    Accession #                 Episode #   1  [id number]                  [id number]                  [id number]   2  [id number]                  [id number]                  [id number]   3  [id number]                  [id number]                  [id number]  ---------------------------------------------------------------------- Indications   [redacted] weeks gestation of pregnancy                Z3A.29   Encounter for other antenatal screening  Z36.2   follow-up (Positive Integrated for OSB 1:10)   Maternal morbid obesity                        O99.210 E66.01   Abnormal biochemical screen for spina bifida   O28.9   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   management of mother in second trimester   (fundal)   Gestational hypertension without significant   O13.3   proteinuria, third trimester   Maternal care for known or suspected poor      O36.5931   fetal growth, third trimester, fetus 1  ---------------------------------------------------------------------- Vital Signs  Weight (lb): 363                               Height:        5'4"  BMI:         62.3 ---------------------------------------------------------------------- Fetal Evaluation  Num Of Fetuses:         1  Fetal Heart Rate(bpm):  142  Cardiac Activity:       Observed  Presentation:           Cephalic  Placenta:               Anterior  P. Cord Insertion:      Marginal insertion  Amniotic Fluid  AFI FV:      Within normal limits  AFI Sum(cm)     %Tile       Largest Pocket(cm)  14.03            46          4.65  RUQ(cm)       RLQ(cm)       LUQ(cm)        LLQ(cm)  1.29          4.24          4.65           3.85 ---------------------------------------------------------------------- Biophysical Evaluation  Amniotic F.V:   Within normal limits       F. Tone:        Observed  F. Movement:    Observed                   Score:          8/8  F. Breathing:   Observed ---------------------------------------------------------------------- Biometry  BPD:      73.1  mm     G. Age:  29w 2d         26  %    CI:        76.85   %    70 - 86                                                          FL/HC:      18.7   %    19.2 - 21.4  HC:      264.1  mm     G. Age:  28w 5d          4  %    HC/AC:      1.07        0.99 - 1.21  AC:  247.6  mm     G. Age:  29w 0d         23  %    FL/BPD:     67.7   %    71 - 87  FL:       49.5  mm     G. Age:  26w 5d        < 1  %    FL/AC:      20.0   %    20 - 24  HUM:      46.1  mm     G. Age:  27w 1d        < 5  %  LV:          5  mm  ULN:      48.2  mm     G. Age:  30w 5d         55  %  TIB:      43.8  mm     G. Age:  27w 0d        < 5  %  RAD:      40.1  mm     G. Age:  28w 0d         36  %  FIB:      44.2  mm     G. Age:  27w 1d         37  %  Est. FW:    1194  gm    2 lb 10 oz       5  % ---------------------------------------------------------------------- OB History  Gravidity:    3         Term:   1        Prem:   0        SAB:   1  TOP:          0       Ectopic:  0        Living: 1 ---------------------------------------------------------------------- Gestational Age  LMP:           29w 5d        Date:  04/29/18                 EDD:   02/03/19  U/S Today:     28w 3d                                        EDD:   02/12/19  Best:          29w 5d     Det. By:  LMP  (04/29/18)          EDD:   02/03/19 ---------------------------------------------------------------------- Anatomy  Cranium:               Appears normal         LVOT:                   Not well visualized  Cavum:                  Appears normal         Aortic Arch:            Not well visualized  Ventricles:            Appears normal  Ductal Arch:            Not well visualized  Choroid Plexus:        Appears normal         Diaphragm:              Appears normal  Cerebellum:            Appears normal         Stomach:                Appears normal, left                                                                        sided  Posterior Fossa:       Appears normal         Abdomen:                Previously seen  Nuchal Fold:           Not applicable (>82    Abdominal Wall:         Previously seen                         wks GA)  Face:                  Not well visualized    Cord Vessels:           Previously seen  Lips:                  Appears normal         Kidneys:                Previously seen  Palate:                Not well visualized    Bladder:                Appears normal  Thoracic:              Appears normal         Spine:                  Not well visualized  Heart:                 Not well visualized    Upper Extremities:      Previously seen  RVOT:                  Not well visualized    Lower Extremities:      Previously seen  Other:  Technically difficult due to maternal habitus and fetal position. ---------------------------------------------------------------------- Doppler - Fetal Vessels  Umbilical Artery                                                            ADFV    RDFV  Yes      No ---------------------------------------------------------------------- Cervix Uterus Adnexa  Cervix  Not visualized (advanced GA >24wks)  Left Ovary  Not visualized.  Right Ovary  Not visualized.  Adnexa  No abnormality visualized. ---------------------------------------------------------------------- Impression  Ms. Baines returned for fetal growth assessment.  High-risk problems include: 1) increased maternal serum AFP  (20.53 MoM) on mid-trimester  screening, 2) marginal cord  insertion, 3) recent diagnosis of gestational hypertension.  Patient feels well without symptoms of severe features of  preeclampsia.  On ultrasound, the estimated fetal weight is at the 5th  percentile. Head circumference measurement is at between  mean and -1 SD (normal). Amniotic fluid is normal and good  fetal activity is seen. BPP 8/8.  Umbilical artery Doppler showed persistent absent-end-  diastolic flow. Marginal cord insertion is seen again.  I counseled the patient on the following:  -Explained the diagnosis of severe fetal growth restriction,  fetal weight percentile, and abnormal umbilical artery Doppler  study. Placental insufficiency is the most-likely cause.  Increased MSAFP seen in the second trimester is probably a  reflection of placental insufficiency. Gestational hypertension  is also from placental cause.  -Frequent monitoring is recommended (twice weekly) till  delivery.  -Discussed the benefit of antenatal corticosteroids.  -Delivery at 34 weeks would be recommended if UA Doppler  shows persistent AEDF or earlier if reversed-flow is seen.  We made appointments to have twice-weekly [facility]. If BPP and  Doppler studies can be performed at your office, we would  recommend once-weekly testing here at the [facility] to avoid  patient inconvenience. ---------------------------------------------------------------------- Recommendations  -BPP, UA Doppler twice weekly.  -NST once weekly  -Betamethasone on 2 consecutive days (patient will decide  on the dates). ----------------------------------------------------------------------                  Tama High, MD Electronically Signed Final Report   11/23/2018 05:01 pm ----------------------------------------------------------------------    Medications:  Scheduled . citalopram  10 mg Oral Daily  . docusate sodium  100 mg Oral Daily  . prenatal multivitamin  1 tablet Oral  Q1200   I have reviewed the patient's current medications.  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  Next study ordered for 12/11/18. FHR tracing with some accelerations but variable decelerations, will continue NST BID. Stable BP since admission, no other severe features, continue to monitor. Plan for delivery by 34 weeks Routine antenatal care for now.  Mora Bellman, MD 12/10/2018,7:41 AM

## 2018-12-11 ENCOUNTER — Inpatient Hospital Stay (HOSPITAL_COMMUNITY): Payer: Medicaid Other

## 2018-12-11 DIAGNOSIS — M9901 Segmental and somatic dysfunction of cervical region: Secondary | ICD-10-CM

## 2018-12-11 DIAGNOSIS — O43193 Other malformation of placenta, third trimester: Secondary | ICD-10-CM

## 2018-12-11 DIAGNOSIS — M9900 Segmental and somatic dysfunction of head region: Secondary | ICD-10-CM

## 2018-12-11 DIAGNOSIS — O133 Gestational [pregnancy-induced] hypertension without significant proteinuria, third trimester: Secondary | ICD-10-CM

## 2018-12-11 DIAGNOSIS — O289 Unspecified abnormal findings on antenatal screening of mother: Secondary | ICD-10-CM

## 2018-12-11 DIAGNOSIS — G44209 Tension-type headache, unspecified, not intractable: Secondary | ICD-10-CM

## 2018-12-11 DIAGNOSIS — Z3A32 32 weeks gestation of pregnancy: Secondary | ICD-10-CM

## 2018-12-11 DIAGNOSIS — O99213 Obesity complicating pregnancy, third trimester: Secondary | ICD-10-CM

## 2018-12-11 DIAGNOSIS — O9989 Other specified diseases and conditions complicating pregnancy, childbirth and the puerperium: Secondary | ICD-10-CM

## 2018-12-11 DIAGNOSIS — O365930 Maternal care for other known or suspected poor fetal growth, third trimester, not applicable or unspecified: Secondary | ICD-10-CM

## 2018-12-11 LAB — CBC
HCT: 36.3 % (ref 36.0–46.0)
Hemoglobin: 11.9 g/dL — ABNORMAL LOW (ref 12.0–15.0)
MCH: 30.2 pg (ref 26.0–34.0)
MCHC: 32.8 g/dL (ref 30.0–36.0)
MCV: 92.1 fL (ref 80.0–100.0)
Platelets: 166 10*3/uL (ref 150–400)
RBC: 3.94 MIL/uL (ref 3.87–5.11)
RDW: 12.9 % (ref 11.5–15.5)
WBC: 9.5 10*3/uL (ref 4.0–10.5)
nRBC: 0 % (ref 0.0–0.2)

## 2018-12-11 LAB — COMPREHENSIVE METABOLIC PANEL
ALT: 15 U/L (ref 0–44)
AST: 12 U/L — ABNORMAL LOW (ref 15–41)
Albumin: 2.4 g/dL — ABNORMAL LOW (ref 3.5–5.0)
Alkaline Phosphatase: 99 U/L (ref 38–126)
Anion gap: 8 (ref 5–15)
BUN: 7 mg/dL (ref 6–20)
CO2: 24 mmol/L (ref 22–32)
Calcium: 8.4 mg/dL — ABNORMAL LOW (ref 8.9–10.3)
Chloride: 103 mmol/L (ref 98–111)
Creatinine, Ser: 0.72 mg/dL (ref 0.44–1.00)
GFR calc Af Amer: >60 mL/min (ref >60)
GFR calc non Af Amer: >60 mL/min (ref >60)
Glucose, Bld: 134 mg/dL — ABNORMAL HIGH (ref 70–99)
Potassium: 3.9 mmol/L (ref 3.5–5.1)
Sodium: 135 mmol/L (ref 135–145)
Total Bilirubin: 0.4 mg/dL (ref 0.3–1.2)
Total Protein: 5.4 g/dL — ABNORMAL LOW (ref 6.5–8.1)

## 2018-12-11 LAB — TYPE AND SCREEN
ABO/RH(D): A POS
Antibody Screen: NEGATIVE

## 2018-12-11 MED ORDER — HYDROMORPHONE HCL 2 MG PO TABS
2.0000 mg | ORAL_TABLET | Freq: Once | ORAL | Status: AC
  Administered 2018-12-11: 2 mg via ORAL
  Filled 2018-12-11: qty 1

## 2018-12-11 MED ORDER — CYCLOBENZAPRINE HCL 10 MG PO TABS
10.0000 mg | ORAL_TABLET | Freq: Three times a day (TID) | ORAL | Status: DC
  Administered 2018-12-11 – 2018-12-14 (×12): 10 mg via ORAL
  Filled 2018-12-11 (×12): qty 1

## 2018-12-11 MED ORDER — HYDROMORPHONE HCL 2 MG PO TABS
1.0000 mg | ORAL_TABLET | ORAL | Status: AC
  Administered 2018-12-11: 1 mg via ORAL
  Filled 2018-12-11: qty 1

## 2018-12-11 NOTE — Progress Notes (Signed)
Patient ID: Khanh Cordner, female   DOB: 1991/04/21, 28 y.o.   MRN: [id number] Denisse Whitenack is a 28 y.o. G3P1011 at [redacted]w[redacted]d  who is admitted for Severe FGR 5% with persistent AEDF, marginal cord insertion.  Fetal presentation is cephalic. Length of Stay:  4  Days Subjective: Yesterday afternoon and last night she had a severe occipital headache. Oral dilaudid "knocked it out". She has a mild headache this morning. She denies visual changes, RUQ pain.  Physical Examination: General appearance - alert, well appearing, and in no distress Chest - normal effort Abdomen - gravid, non-tender Fundal Height:  size equals dates Extremities: Homans sign is negative, no sign of DVT  Reflexes: 1+ bilaterally and equal   FHR reactive   ASSESSMENT: tension type headache.      Patient Active Problem List   Diagnosis Date Noted  . Supervision of high risk pregnancy, antepartum 07/05/2018    Priority: Low  . IUGR (intrauterine growth restriction) affecting care of mother, third trimester 12/08/2018  . Low amniotic fluid, third trimester, fetus 1 12/08/2018  . Generalized anxiety disorder 12/02/2018  . Marginal insertion of umbilical cord affecting management of mother 11/27/2018  . Anxiety 11/07/2018  . Gestational hypertension 11/06/2018  . Abnormal chromosomal and genetic finding on antenatal screening mother 09/26/2018  . Maternal morbid obesity with BMI 50, antepartum ([place]) 01/05/2013  . 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 Plan for delivery at 34 weeks/ prn sooner With regard to her headaches, she has no other signs/symptoms of pre eclampsia.

## 2018-12-11 NOTE — Progress Notes (Signed)
Subjective: Janice Taylor is a 28 yo G3P1011 at [redacted]w[redacted]d EGA who was admitted on 12/07/18 for gHTN, increased AFP, severe FGR 5% with persistent AEDF, marginal cord insertion.  She has been complaining of headaches at the base of her skull which she describes as tight and throbbing. She has received tylenol, fioricet, and cyclobenzaprine, which has not worked, and last night received oral Dilaudid, which "knocked it out" until she woke up this morning.   Pre-eclampsia has been ruled out. She denies visual changes, numbness, tingling, motor weaknesses, or changes to speech.  Objective: Vitals: Blood pressure 135/76, pulse 96, temperature 98.2 F (36.8 C), temperature source Oral, resp. rate 18, height 5\' 4"  (1.626 m), weight (!) 169 kg, last menstrual period 04/29/2018, SpO2 99 %. HEENT: Atraumatic, normocephalic. PERRLA, EOMI. Mucous membranes moist Neck: Hypertonic splenius capitus and paraspinal muscles bilaterally. Ease of motion into extension with restriction into cervical flexion.   Assessment: Occipital/tension headache Neck Pain Somatic dysfunction of the head region Somatic dysfunction of the cervical region  Plan: I discussed with Janice Taylor about using osteopathic manipulative therapy to try to alleviate her symptoms, as her headaches are most likely musculoskeletal in nature. She verbally consented, and OMT was performed as per consent and procedure notes below. When she was reassessed, she had full resolution of her headache, and she was taught gentle stretching exercises to help with muscle tension.  ---  OMT consent: After review of the patient's history and physical, her symptoms are most likely musculoskeletal in nature. It is my medical opinion that she would likely benefit from OMT. Techniques were explained to the patient, and she verbally consented to treatment of her pain with OMT.  OMT PROCEDURE: Patient was verbally consented for OMT, as her symptoms were felt to most  likely be musculoskeletal in nature. Treatment techniques included, but were not limited to, cranial OMT, myofascial release, muscle energy, Still technique, and counterstrain. She was reassessed at the end of the procedure and had great improvement in her pain, TART changes, and range of motion.

## 2018-12-11 NOTE — Progress Notes (Signed)
Dr Rip Harbour notified of prolonged decels.

## 2018-12-12 ENCOUNTER — Other Ambulatory Visit: Payer: Medicaid Other

## 2018-12-12 ENCOUNTER — Encounter: Payer: Medicaid Other | Admitting: Obstetrics and Gynecology

## 2018-12-12 DIAGNOSIS — O365930 Maternal care for other known or suspected poor fetal growth, third trimester, not applicable or unspecified: Principal | ICD-10-CM

## 2018-12-12 NOTE — Progress Notes (Signed)
Patient ID: Janice Taylor, female   DOB: [birth year], 28 y.o.   MRN: [id number] [place] ANTEPARTUM COMPREHENSIVE PROGRESS NOTE  Janice Taylor is a 28 y.o. G3P1011 at [redacted]w[redacted]d  who is admitted for [facility] with severe IUGR, oligo and abnormal UA dopplers.   Fetal presentation is cephalic. U/S 12/11/18 Length of Stay:  5  Days  Subjective: Pt has no complaints this morning. HA was relieved with OMT. Denies visual changes. Tolerating diet. Voiding. Patient reports good fetal movement.  She reports no uterine contractions, no bleeding and no loss of fluid per vagina.  Vitals:  Blood pressure (!) 136/94, pulse (!) 101, temperature 98.3 F (36.8 C), temperature source Oral, resp. rate 18, height 5\' 4"  (1.626 m), weight (!) 169 kg, last menstrual period 04/29/2018, SpO2 100 %. Physical Examination: Lungs clear Heart RRR Abd soft + BS  Gravid Ext non tender  Fetal Monitoring:  150's, occ variable, overall good variablity, no ut ctx  Labs:  No results found for this or any previous visit (from the past 24 hour(s)).  Imaging Studies:    BPP yesterday   Medications:  Scheduled . citalopram  10 mg Oral Daily  . cyclobenzaprine  10 mg Oral TID  . docusate sodium  100 mg Oral Daily  . prenatal multivitamin  1 tablet Oral Q1200   I have reviewed the patient's current medications.  ASSESSMENT: IUP 32 3/7 weeks GHTN, no meds IUGR Oligo Abnormal UA dopplers GAD  PLAN: Stable. HA has resolved. No S/Sx of PEC presently. BPP 8/8 yesterday. Repeat on Thursday Continue routine antenatal care.   Chancy Milroy 12/12/2018,11:01 AM

## 2018-12-12 NOTE — Plan of Care (Signed)
  Problem: Coping: Goal: Level of anxiety will decrease Outcome: Progressing   Problem: Education: Goal: Knowledge of the prescribed therapeutic regimen will improve Outcome: Completed/Met   Problem: Education: Goal: Knowledge of General Education information will improve Description: Including pain rating scale, medication(s)/side effects and non-pharmacologic comfort measures Outcome: Completed/Met   Problem: Health Behavior/Discharge Planning: Goal: Ability to manage health-related needs will improve Outcome: Completed/Met   Problem: Clinical Measurements: Goal: Respiratory complications will improve Outcome: Completed/Met Goal: Cardiovascular complication will be avoided Outcome: Completed/Met   Problem: Activity: Goal: Risk for activity intolerance will decrease Outcome: Completed/Met   Problem: Nutrition: Goal: Adequate nutrition will be maintained Outcome: Completed/Met   Problem: Elimination: Goal: Will not experience complications related to bowel motility Outcome: Completed/Met Goal: Will not experience complications related to urinary retention Outcome: Completed/Met

## 2018-12-13 MED ORDER — SODIUM CHLORIDE 0.9% FLUSH
3.0000 mL | Freq: Two times a day (BID) | INTRAVENOUS | Status: DC
  Administered 2018-12-13 – 2018-12-14 (×4): 3 mL via INTRAVENOUS

## 2018-12-13 NOTE — Progress Notes (Signed)
Patient ID: Janice Taylor, female   DOB: 01-17-1991, 28 y.o.   MRN: [id number] [place] ANTEPARTUM COMPREHENSIVE PROGRESS NOTE  Janice Taylor is a 28 y.o. G3P1011 at 108w4d  who is admitted for [facility] with Severe IUGR, oligo and abnormal UA dopplers.   Fetal presentation is cephalic. U/S 12/11/18 Length of Stay:  6  Days  Subjective: No complaints this morning. No HA or visual changes. Tolerating diet. Voiding.  Patient reports good fetal movement.  She reports no uterine contractions, no bleeding and no loss of fluid per vagina.  Vitals:  Blood pressure 122/72, pulse 94, temperature 98.2 F (36.8 C), temperature source Oral, resp. rate 20, height 5\' 4"  (1.626 m), weight (!) 169 kg, last menstrual period 04/29/2018, SpO2 100 %. Physical Examination: Lungs clear Heart RRR Abd soft + BS gravid Ext non tender Fetal Monitoring:  140-150's, good variablity, no ut ctx  Labs:  No results found for this or any previous visit (from the past 24 hour(s)).  Imaging Studies:    BPP tomorrow   Medications:  Scheduled . citalopram  10 mg Oral Daily  . cyclobenzaprine  10 mg Oral TID  . docusate sodium  100 mg Oral Daily  . prenatal multivitamin  1 tablet Oral Q1200  . sodium chloride flush  3 mL Intravenous Q12H   I have reviewed the patient's current medications.  ASSESSMENT: IUP 32 3/7 weeks GHTN, no meds IUGR Oligo Abnormal UA dopplers GAD  PLAN: Stable. No S/Sx of PEC presently. BPP tomorrow.  Continue routine antenatal care.   Chancy Milroy 12/13/2018,10:40 AM

## 2018-12-14 ENCOUNTER — Inpatient Hospital Stay (HOSPITAL_COMMUNITY): Payer: Medicaid Other

## 2018-12-14 DIAGNOSIS — O365930 Maternal care for other known or suspected poor fetal growth, third trimester, not applicable or unspecified: Secondary | ICD-10-CM

## 2018-12-14 DIAGNOSIS — O43193 Other malformation of placenta, third trimester: Secondary | ICD-10-CM

## 2018-12-14 DIAGNOSIS — O99213 Obesity complicating pregnancy, third trimester: Secondary | ICD-10-CM

## 2018-12-14 DIAGNOSIS — O133 Gestational [pregnancy-induced] hypertension without significant proteinuria, third trimester: Secondary | ICD-10-CM

## 2018-12-14 DIAGNOSIS — Z3A32 32 weeks gestation of pregnancy: Secondary | ICD-10-CM

## 2018-12-14 DIAGNOSIS — O289 Unspecified abnormal findings on antenatal screening of mother: Secondary | ICD-10-CM

## 2018-12-14 NOTE — Progress Notes (Signed)
Patient ID: Tenya Cheevers, female   DOB: [birth year], 28 y.o.   MRN: [id number] [place] ANTEPARTUM COMPREHENSIVE PROGRESS NOTE  Dotsie Dorff is a 28 y.o. G3P1011 at [redacted]w[redacted]d  who is admitted for [facility] with severe IUGR, oligo and abnormal UA dopplers.   Fetal presentation is cephalic. U/S 12/11/18 Length of Stay:  7  Days  Subjective: No complaints today. Denies HA or visual changes Patient reports good fetal movement.  She reports no uterine contractions, no bleeding and no loss of fluid per vagina.  Vitals:  Blood pressure 129/81, pulse 92, temperature 98 F (36.7 C), temperature source Oral, resp. rate 20, height 5\' 4"  (1.626 m), weight (!) 169 kg, last menstrual period 04/29/2018, SpO2 99 %. Physical Examination: Lungs clear Heart RRR Abd soft + BS gravid non tender Ext non tender  Fetal Monitoring:  140-150's , + accels, good variability, no ut ctx  Labs:  Results for orders placed or performed during the hospital encounter of 12/07/18 (from the past 24 hour(s))  Type and screen [place]   Collection Time: 12/14/18  4:49 AM  Result Value Ref Range   ABO/RH(D) A POS    Antibody Screen NEG    Sample Expiration      12/17/2018,2359 Performed at [facility], [place] [address] [place], [place] [address]     Imaging Studies:    BPP results pending   Medications:  Scheduled . citalopram  10 mg Oral Daily  . cyclobenzaprine  10 mg Oral TID  . docusate sodium  100 mg Oral Daily  . prenatal multivitamin  1 tablet Oral Q1200  . sodium chloride flush  3 mL Intravenous Q12H   I have reviewed the patient's current medications.  ASSESSMENT: IUP 32 5/7 weeks GHTN, no meds IUGR Oligo  Abnormal UA dopplers GAD   PLAN: Stable. No S/Sx of PEC BPP today Continue routine antenatal care.   Chancy Milroy 12/14/2018,10:32 AM

## 2018-12-14 NOTE — Progress Notes (Signed)
Pt requested to come off monitor

## 2018-12-14 NOTE — Progress Notes (Signed)
Initial Nutrition Assessment  DOCUMENTATION CODES:   Morbid obesity  INTERVENTION:  Regular diet Double protein portions upon request  NUTRITION DIAGNOSIS:  Increased nutrient needs related to (pregnancy and fetal growth requirements) as evidenced by (32 weeks IUP).  GOAL:  Patient will meet greater than or equal to 90% of their needs   MONITOR:  Weight trends  REASON FOR ASSESSMENT:  Antenatal   ASSESSMENT:  32 5/7 weeks, adm with GHTN, oglio, IUGR. Hx of 70 lb weight gain year prior to conception. pregravid wt 361 lbs, BMI 62. 10 lb weight gain to date. 07/05/18 A1C 5.5%   Diet Order:   Diet Order            Diet regular Room service appropriate? Yes; Fluid consistency: Thin  Diet effective now              EDUCATION NEEDS:   No education needs have been identified at this time  Skin:  Skin Assessment: Reviewed RN Assessment  Height:   Ht Readings from Last 1 Encounters:  12/11/18 5\' 4"  (1.626 m)    Weight:   Wt Readings from Last 1 Encounters:  12/11/18 (!) 169 kg    Ideal Body Weight:   120 lbs  BMI:  Body mass index is 63.94 kg/m.  Estimated Nutritional Needs:   Kcal:  2400-2600  Protein:  100-110 g  Fluid:  2.7 L    Weyman Rodney M.Fredderick Severance LDN Neonatal Nutrition Support Specialist/RD III Pager [phone]      Phone [phone]

## 2018-12-15 ENCOUNTER — Encounter: Payer: Medicaid Other | Admitting: Obstetrics & Gynecology

## 2018-12-15 ENCOUNTER — Other Ambulatory Visit: Payer: Medicaid Other

## 2018-12-15 LAB — TYPE AND SCREEN
ABO/RH(D): A POS
Antibody Screen: NEGATIVE

## 2018-12-15 NOTE — Progress Notes (Signed)
Pt left hospital AMA with mother. Pt ambulated to car.

## 2018-12-15 NOTE — Discharge Summary (Signed)
Discharge Note. Pt signed out AMA for family and personal reasons. Risks to mother and baby reviewed detailed including but not limited to maternal and fetal demise. And possibility of if pt returned at a later date may would have to transfer to another faculty due to NICU currently closed due to high census. Pt verbalized understanding and signed [place] papers.

## 2018-12-15 NOTE — Progress Notes (Signed)
Pt requested AMA paperwork. Informed RN that she wanted to attend her baby shower. RN reminded patient that MD thought it was the best medical interest for mom and baby to stay. Rip Harbour, MD was notified. MD to bedside to discuss potential consequences of leaving AMA. Patient stated she understood and would accept those potential consequences.

## 2018-12-17 ENCOUNTER — Inpatient Hospital Stay (HOSPITAL_COMMUNITY)
Admission: AD | Admit: 2018-12-17 | Discharge: 2018-12-21 | DRG: 787 | Disposition: A | Discharge status: Home / Self Care | Payer: Medicaid Other | Attending: Obstetrics & Gynecology | Admitting: Obstetrics & Gynecology

## 2018-12-17 ENCOUNTER — Other Ambulatory Visit: Payer: Self-pay

## 2018-12-17 ENCOUNTER — Inpatient Hospital Stay (HOSPITAL_COMMUNITY): Payer: Medicaid Other

## 2018-12-17 ENCOUNTER — Encounter (HOSPITAL_COMMUNITY): Payer: Self-pay | Admitting: *Deleted

## 2018-12-17 DIAGNOSIS — O99213 Obesity complicating pregnancy, third trimester: Secondary | ICD-10-CM

## 2018-12-17 DIAGNOSIS — O365990 Maternal care for other known or suspected poor fetal growth, unspecified trimester, not applicable or unspecified: Secondary | ICD-10-CM | POA: Diagnosis present

## 2018-12-17 DIAGNOSIS — O368130 Decreased fetal movements, third trimester, not applicable or unspecified: Secondary | ICD-10-CM

## 2018-12-17 DIAGNOSIS — A6000 Herpesviral infection of urogenital system, unspecified: Secondary | ICD-10-CM | POA: Diagnosis present

## 2018-12-17 DIAGNOSIS — O99214 Obesity complicating childbirth: Secondary | ICD-10-CM | POA: Diagnosis present

## 2018-12-17 DIAGNOSIS — O365930 Maternal care for other known or suspected poor fetal growth, third trimester, not applicable or unspecified: Secondary | ICD-10-CM

## 2018-12-17 DIAGNOSIS — O134 Gestational [pregnancy-induced] hypertension without significant proteinuria, complicating childbirth: Secondary | ICD-10-CM | POA: Diagnosis present

## 2018-12-17 DIAGNOSIS — O1494 Unspecified pre-eclampsia, complicating childbirth: Secondary | ICD-10-CM | POA: Diagnosis present

## 2018-12-17 DIAGNOSIS — O9832 Other infections with a predominantly sexual mode of transmission complicating childbirth: Secondary | ICD-10-CM | POA: Diagnosis present

## 2018-12-17 DIAGNOSIS — O0990 Supervision of high risk pregnancy, unspecified, unspecified trimester: Secondary | ICD-10-CM

## 2018-12-17 DIAGNOSIS — O4103X0 Oligohydramnios, third trimester, not applicable or unspecified: Secondary | ICD-10-CM | POA: Diagnosis present

## 2018-12-17 DIAGNOSIS — O43193 Other malformation of placenta, third trimester: Secondary | ICD-10-CM

## 2018-12-17 DIAGNOSIS — Z3A33 33 weeks gestation of pregnancy: Secondary | ICD-10-CM

## 2018-12-17 DIAGNOSIS — O43123 Velamentous insertion of umbilical cord, third trimester: Secondary | ICD-10-CM | POA: Diagnosis present

## 2018-12-17 DIAGNOSIS — O99344 Other mental disorders complicating childbirth: Secondary | ICD-10-CM | POA: Diagnosis present

## 2018-12-17 DIAGNOSIS — O365931 Maternal care for other known or suspected poor fetal growth, third trimester, fetus 1: Secondary | ICD-10-CM

## 2018-12-17 DIAGNOSIS — F411 Generalized anxiety disorder: Secondary | ICD-10-CM | POA: Diagnosis present

## 2018-12-17 DIAGNOSIS — O289 Unspecified abnormal findings on antenatal screening of mother: Secondary | ICD-10-CM

## 2018-12-17 DIAGNOSIS — Z30430 Encounter for insertion of intrauterine contraceptive device: Secondary | ICD-10-CM | POA: Diagnosis not present

## 2018-12-17 DIAGNOSIS — O365911 Maternal care for other known or suspected poor fetal growth, first trimester, fetus 1: Secondary | ICD-10-CM | POA: Diagnosis present

## 2018-12-17 DIAGNOSIS — Z20828 Contact with and (suspected) exposure to other viral communicable diseases: Secondary | ICD-10-CM | POA: Diagnosis present

## 2018-12-17 DIAGNOSIS — O133 Gestational [pregnancy-induced] hypertension without significant proteinuria, third trimester: Secondary | ICD-10-CM

## 2018-12-17 LAB — CBC
HCT: 37.8 % (ref 36.0–46.0)
Hemoglobin: 12.3 g/dL (ref 12.0–15.0)
MCH: 30.2 pg (ref 26.0–34.0)
MCHC: 32.5 g/dL (ref 30.0–36.0)
MCV: 92.9 fL (ref 80.0–100.0)
Platelets: 182 10*3/uL (ref 150–400)
RBC: 4.07 MIL/uL (ref 3.87–5.11)
RDW: 12.8 % (ref 11.5–15.5)
WBC: 11.4 10*3/uL — ABNORMAL HIGH (ref 4.0–10.5)
nRBC: 0 % (ref 0.0–0.2)

## 2018-12-17 LAB — COMPREHENSIVE METABOLIC PANEL
ALT: 16 U/L (ref 0–44)
AST: 15 U/L (ref 15–41)
Albumin: 2.6 g/dL — ABNORMAL LOW (ref 3.5–5.0)
Alkaline Phosphatase: 102 U/L (ref 38–126)
Anion gap: 11 (ref 5–15)
BUN: 9 mg/dL (ref 6–20)
CO2: 20 mmol/L — ABNORMAL LOW (ref 22–32)
Calcium: 8.6 mg/dL — ABNORMAL LOW (ref 8.9–10.3)
Chloride: 103 mmol/L (ref 98–111)
Creatinine, Ser: 0.93 mg/dL (ref 0.44–1.00)
GFR calc Af Amer: >60 mL/min (ref >60)
GFR calc non Af Amer: >60 mL/min (ref >60)
Glucose, Bld: 157 mg/dL — ABNORMAL HIGH (ref 70–99)
Potassium: 4.6 mmol/L (ref 3.5–5.1)
Sodium: 134 mmol/L — ABNORMAL LOW (ref 135–145)
Total Bilirubin: 0.4 mg/dL (ref 0.3–1.2)
Total Protein: 5.8 g/dL — ABNORMAL LOW (ref 6.5–8.1)

## 2018-12-17 LAB — TYPE AND SCREEN
ABO/RH(D): A POS
Antibody Screen: NEGATIVE

## 2018-12-17 LAB — PROTEIN / CREATININE RATIO, URINE
Creatinine, Urine: 149.99 mg/dL
Protein Creatinine Ratio: 0.18 mg/mg{Cre} — ABNORMAL HIGH (ref 0.00–0.15)
Total Protein, Urine: 27 mg/dL

## 2018-12-17 MED ORDER — PRENATAL MULTIVITAMIN CH: 1.0000 | ORAL_TABLET | Freq: Every day | ORAL | Status: DC

## 2018-12-17 MED ORDER — ENOXAPARIN SODIUM 40 MG/0.4ML ~~LOC~~ SOLN: 40.0000 mg | SUBCUTANEOUS | Status: DC

## 2018-12-17 MED ORDER — ACETAMINOPHEN 325 MG PO TABS: 650.0000 mg | ORAL_TABLET | ORAL | Status: DC | PRN

## 2018-12-17 MED ORDER — HYDROXYZINE HCL 10 MG PO TABS
10.0000 mg | ORAL_TABLET | Freq: Three times a day (TID) | ORAL | Status: DC | PRN
  Filled 2018-12-17: qty 1

## 2018-12-17 MED ORDER — HYDROXYZINE HCL 25 MG PO TABS
25.0000 mg | ORAL_TABLET | Freq: Once | ORAL | Status: AC
  Administered 2018-12-17: 25 mg via ORAL
  Filled 2018-12-17: qty 1

## 2018-12-17 MED ORDER — CYCLOBENZAPRINE HCL 10 MG PO TABS
10.0000 mg | ORAL_TABLET | Freq: Three times a day (TID) | ORAL | Status: DC
  Administered 2018-12-17: 23:00:00 10 mg via ORAL
  Filled 2018-12-17: qty 1

## 2018-12-17 MED ORDER — ENOXAPARIN SODIUM 40 MG/0.4ML ~~LOC~~ SOLN
40.0000 mg | SUBCUTANEOUS | Status: DC
  Administered 2018-12-17: 23:00:00 40 mg via SUBCUTANEOUS

## 2018-12-17 MED ORDER — ENOXAPARIN SODIUM 100 MG/ML ~~LOC~~ SOLN
0.5000 mg/kg | SUBCUTANEOUS | Status: DC
  Filled 2018-12-17: qty 0.85

## 2018-12-17 MED ORDER — CALCIUM CARBONATE ANTACID 500 MG PO CHEW
2.0000 | CHEWABLE_TABLET | ORAL | Status: DC | PRN
  Administered 2018-12-17: 23:00:00 400 mg via ORAL
  Filled 2018-12-17: qty 2

## 2018-12-17 MED ORDER — LABETALOL HCL 200 MG PO TABS
200.0000 mg | ORAL_TABLET | Freq: Three times a day (TID) | ORAL | Status: DC
  Administered 2018-12-17 – 2018-12-18 (×2): 200 mg via ORAL
  Filled 2018-12-17 (×2): qty 1

## 2018-12-17 MED ORDER — DOCUSATE SODIUM 100 MG PO CAPS: 100.0000 mg | ORAL_CAPSULE | Freq: Every day | ORAL | Status: DC

## 2018-12-17 MED ORDER — ZOLPIDEM TARTRATE 5 MG PO TABS
5.0000 mg | ORAL_TABLET | Freq: Every evening | ORAL | Status: DC | PRN
  Administered 2018-12-17: 23:00:00 5 mg via ORAL
  Filled 2018-12-17: qty 1

## 2018-12-17 NOTE — MAU Note (Signed)
Janice Taylor is a 28 y.o. at [redacted]w[redacted]d here in MAU reporting: pt states she is here to readmitted. No pain, bleeding, or LOF. Reports some FM but less than normal.  Pain score: 0/10  Attempted to obtain FHT by doppler in triage, unable to obtain and pt directly to room 122.  At approximately Janice Taylor Dr Dione Plover and Ephraim Hamburger NP informed that this RN was unable to obtain Janice Taylor in triage, requesting in person evaluation. Providers to bedside with u/s machine.  1753 this RN informs Janice Taylor (u/s) of the need for stat bedside and order entered by Janice Taylor at bedside  Janice Taylor into room 122 to obtain vital signs but unable to due to bedside u/s and pt on the phone upset with family members.

## 2018-12-17 NOTE — MAU Provider Note (Addendum)
History     CSN: [id number]  Arrival date and time: 12/17/18 1733   First Provider Initiated Contact with Patient 12/17/18 1754      Chief Complaint  Patient presents with  . Decreased Fetal Movement   HPI Janice Taylor 28 y.o.   Left AMA on 12-15-18 for personal reasons.  Was admitted for severe range BPs, Gestational hypertension, marginal cord insertion, oligo, severe IUGR, and absent end diastolic flow on 0000000.  Had also been admitted 12-01-18 to 12-04-18.   Additionally client has generalized anxiety disorder.  Yesterday felt the baby move.  Today has not been feeling the baby move, so returned to MAU.  Ate one hour before coming to MAU.  RN unable to get FHT with doppler.  Fellow attempted to get [place] with MAU ultrasound, Unable to get good views of the heart.  Ultrasound called for bedside ultrasound for limited US and BPP today.  Able to get FHT at 140 by ultrasound tech.  Client crying and saying she has severe anxiety, but breathing at a normal rate.  Client reports she left the hospital earlier as she is a single mother with a child in school and she had to get her child started into the school year.  OB History    Gravida  3   Para  1   Term  1   Preterm      AB  1   Living  1     SAB  1   TAB      Ectopic      Multiple      Live Births  1           Past Medical History:  Diagnosis Date  . ADHD (attention deficit hyperactivity disorder)   . Anxiety   . Clotting disorder ([place])   . Cough   . Depression   . Genital HSV   . 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 Leg swelling   . Obesity 01/05/2013  . Thrombocytopenia ([place]) 11/25/2011   Consistent with ITP    Past Surgical History:  Procedure Laterality Date  . TONSILECTOMY, ADENOIDECTOMY, BILATERAL MYRINGOTOMY AND TUBES      Family History  Problem Relation Age of Onset  . Diabetes Maternal Uncle   . Kidney disease Maternal Uncle        renal  failure/dialysis  . Heart attack Maternal Grandmother     Social History   Tobacco Use  . Smoking status: Never Smoker  . Smokeless tobacco: Never Used  Substance Use Topics  . Alcohol use: No  . Drug use: No    Allergies:  Allergies  Allergen Reactions  . Codeine Nausea And Vomiting  . Ondansetron Hcl Other (See Comments)    Not effective`    Medications Prior to Admission  Medication Sig Dispense Refill Last Dose  . citalopram (CELEXA) 20 MG tablet Take 10mg  (1/2 tablet) daily x 1 week, then 20mg  (1 tablet) daily thereafter 30 tablet 2 12/17/2018 at Unknown time  . cyclobenzaprine (FLEXERIL) 10 MG tablet Take 1 tablet (10 mg total) by mouth 3 (three) times daily as needed for muscle spasms. 30 tablet 2 12/17/2018 at Unknown time  . docusate sodium (COLACE) 100 MG capsule Take 1 capsule (100 mg total) by mouth 2 (two) times daily as needed for mild constipation. 30 capsule 2 12/17/2018 at Unknown time  . famotidine (PEPCID) 20 MG tablet Take 1 tablet (20 mg total) by  mouth daily as needed for heartburn or indigestion. 30 tablet 2 12/17/2018 at Unknown time  . hydrOXYzine (ATARAX/VISTARIL) 10 MG tablet Take 1 tablet (10 mg total) by mouth 3 (three) times daily as needed for anxiety. 30 tablet 2 12/17/2018 at Unknown time  . Pediatric Multiple Vit-C-FA (FLINSTONES GUMMIES OMEGA-3 DHA) CHEW Chew 1 tablet by mouth daily.   12/17/2018 at Unknown time  . valACYclovir (VALTREX) 1000 MG tablet Take 1 tablet (1,000 mg total) by mouth daily. 30 tablet 2 12/17/2018 at Unknown time  . zolpidem (AMBIEN) 5 MG tablet Take 1 tablet (5 mg total) by mouth at bedtime as needed for sleep. 30 tablet 2 12/16/2018 at Unknown time  . Prenatal Vit-Fe Fumarate-FA (MULTIVITAMIN-PRENATAL) 27-0.8 MG TABS tablet Take 1 tablet by mouth daily at 12 noon.       Review of Systems  Constitutional: Negative for fever.  Eyes: Negative for visual disturbance.  Respiratory: Negative for cough and shortness of breath.    Gastrointestinal: Negative for abdominal pain.  Genitourinary: Negative for vaginal bleeding and vaginal discharge.       Decreased fetal movement No Right upper quadrant pain  Neurological: Negative for headaches.   Physical Exam   Blood pressure (!) 152/76, pulse (!) 102, temperature 98.1 F (36.7 C), temperature source Oral, resp. rate 17, height 5\' 4"  (1.626 m), weight (!) 172 kg, last menstrual period 04/29/2018, SpO2 100 %.  Physical Exam  Nursing note and vitals reviewed. Constitutional: She is oriented to person, place, and time. She appears well-developed and well-nourished.  HENT:  Head: Normocephalic.  Eyes: EOM are normal.  Neck: Neck supple.  GI: Soft. There is no abdominal tenderness. There is no rebound and no guarding.  FHT 140 by ultrasound  When able to run strip - Baseline 140 with moderate variability 10x10 accels noted No contractions  No decelerations Reassuring strip with only 10x10 accels   Musculoskeletal: Normal range of motion.     Comments: Edema is unchanged from the time of her last hospitalization  Neurological: She is alert and oriented to person, place, and time.  Skin: Skin is warm and dry.  Psychiatric: She has a normal mood and affect.    MAU Course  Procedures Labs Results for orders placed or performed during the hospital encounter of 12/17/18 (from the past 24 hour(s))  CBC     Status: Abnormal   Collection Time: 12/17/18  7:00 PM  Result Value Ref Range   WBC 11.4 (H) 4.0 - 10.5 K/uL   RBC 4.07 3.87 - 5.11 MIL/uL   Hemoglobin 12.3 12.0 - 15.0 g/dL   HCT 37.8 36.0 - 46.0 %   MCV 92.9 80.0 - 100.0 fL   MCH 30.2 26.0 - 34.0 pg   MCHC 32.5 30.0 - 36.0 g/dL   RDW 12.8 11.5 - 15.5 %   Platelets 182 150 - 400 K/uL   nRBC 0.0 0.0 - 0.2 %  Comprehensive metabolic panel     Status: Abnormal   Collection Time: 12/17/18  7:00 PM  Result Value Ref Range   Sodium 134 (L) 135 - 145 mmol/L   Potassium 4.6 3.5 - 5.1 mmol/L   Chloride 103  98 - 111 mmol/L   CO2 20 (L) 22 - 32 mmol/L   Glucose, Bld 157 (H) 70 - 99 mg/dL   BUN 9 6 - 20 mg/dL   Creatinine, Ser 0.93 0.44 - 1.00 mg/dL   Calcium 8.6 (L) 8.9 - 10.3 mg/dL   Total Protein  5.8 (L) 6.5 - 8.1 g/dL   Albumin 2.6 (L) 3.5 - 5.0 g/dL   AST 15 15 - 41 U/L   ALT 16 0 - 44 U/L   Alkaline Phosphatase 102 38 - 126 U/L   Total Bilirubin 0.4 0.3 - 1.2 mg/dL   GFR calc non Af Amer >60 >60 mL/min   GFR calc Af Amer >60 >60 mL/min   Anion gap 11 5 - 15  Protein / creatinine ratio, urine     Status: Abnormal   Collection Time: 12/17/18  7:33 PM  Result Value Ref Range   Creatinine, Urine 149.99 mg/dL   Total Protein, Urine 27 mg/dL   Protein Creatinine Ratio 0.18 (H) 0.00 - 0.15 mg/mg[Cre]    MDM Was able to feel the baby move after the ultrasound today and monitors applied.  Consult with Dr. Elonda Husky - has had 2 pressures in severe range but has had scare over [facility] and one severe range when blood was being drawn - recheck continued to be elevated.  Will continue to observe while labs are running.  Chronic hypertension is not on client's problem list but on chart review, elevated BPs noted in the this pregnancy before 20 weeks:  ER visit 07-24-18 BP 150/73 and 06-13-18 151/82.  Additionally BPs on 08-22-15 139/83 (not pregnant) and 02-20-17 158/81 (flank pain).  So possibly client has chronic hypertension.  One deceleration noted lasting 90 seconds down to 110 bpm.  Creatinine level is increasing.  PR ration is increasing but not yet elevated.  BPP today 8/8  2018 Consult with Dr. Elonda Husky - will admit to [facility]; BPs have continued to be elevated and sometimes are severe range. Vitals:   12/17/18 2001 12/17/18 2016  BP: (!) 165/78 (!) 161/90  Pulse: 98 97  Resp:    Temp:    SpO2:      Assessment and Plan  Admit to [facility] Client had Covid testing last on 12-01-18, will order today Betamethasone given 11-27-18  [place] 12/17/2018, 6:32 PM

## 2018-12-17 NOTE — H&P (Signed)
Janice Taylor is a 28 y.o. female G19P1011 Estimated Date of Delivery: 02/03/19 [redacted]w[redacted]d presenting for in house management of pre eclampsia with FGR and AEDF and dramatically elevate AFPA few BP in severe range in MAU now better with BPP 10/10, 1 90 second decel.  . OB History    Gravida  3   Para  1   Term  1   Preterm      AB  1   Living  1     SAB  1   TAB      Ectopic      Multiple      Live Births  1          Past Medical History:  Diagnosis Date  . ADHD (attention deficit hyperactivity disorder)   . Anxiety   . Clotting disorder ([place])   . Cough   . Depression   . Genital HSV   . 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 Leg swelling   . Obesity 01/05/2013  . Thrombocytopenia ([place]) 11/25/2011   Consistent with ITP   Past Surgical History:  Procedure Laterality Date  . TONSILECTOMY, ADENOIDECTOMY, BILATERAL MYRINGOTOMY AND TUBES     Family History: family history includes Diabetes in her maternal uncle; Heart attack in her maternal grandmother; Kidney disease in her maternal uncle. Social History:  reports that she has never smoked. She has never used smokeless tobacco. She reports that she does not drink alcohol or use drugs.     Maternal Diabetes: No Genetic Screening: Abnormal:  Results: Elevated AFP Maternal Ultrasounds/Referrals: IUGR and Other: Fetal Ultrasounds or other Referrals:  Referred to Materal Fetal Medicine  Maternal Substance Abuse:  No Significant Maternal Medications:  None Significant Maternal Lab Results:  Other: AFP Other Comments:    ROS   Review of Systems  Constitutional: Negative for fever, chills, weight loss, malaise/fatigue and diaphoresis.  HENT: Negative for hearing loss, ear pain, nosebleeds, congestion, sore throat, neck pain, tinnitus and ear discharge.   Eyes: Negative for blurred vision, double vision, photophobia, pain, discharge and redness.  Respiratory: Negative for cough,  hemoptysis, sputum production, shortness of breath, wheezing and stridor.   Cardiovascular: Negative for chest pain, palpitations, orthopnea, claudication, leg swelling and PND.  Gastrointestinal: negative for abdominal pain. Negative for heartburn, nausea, vomiting, diarrhea, constipation, blood in stool and melena.  Genitourinary: Negative for dysuria, urgency, frequency, hematuria and flank pain.  Musculoskeletal: Negative for myalgias, back pain, joint pain and falls.  Skin: Negative for itching and rash.  Neurological: Negative for dizziness, tingling, tremors, sensory change, speech change, focal weakness, seizures, loss of consciousness, weakness and headaches.  Endo/Heme/Allergies: Negative for environmental allergies and polydipsia. Does not bruise/bleed easily.  Psychiatric/Behavioral: Negative for depression, suicidal ideas, hallucinations, memory loss and substance abuse. The patient is not nervous/anxious and does not have insomnia.      History  Past Medical History:  Diagnosis Date  . ADHD (attention deficit hyperactivity disorder)   . Anxiety   . Clotting disorder ([place])   . Cough   . Depression   . Genital HSV   . 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 Leg swelling   . Obesity 01/05/2013  . Thrombocytopenia ([place]) 11/25/2011   Consistent with ITP    Past Surgical History:  Procedure Laterality Date  . TONSILECTOMY, ADENOIDECTOMY, BILATERAL MYRINGOTOMY AND TUBES      OB History    Gravida  3  Para  1   Term  1   Preterm      AB  1   Living  1     SAB  1   TAB      Ectopic      Multiple      Live Births  1           Allergies  Allergen Reactions  . Codeine Nausea And Vomiting  . Ondansetron Hcl Other (See Comments)    Not effective`    Social History   Socioeconomic History  . Marital status: Single    Spouse name: Not on file  . Number of children: 1  . Years of education: Not on file  .  Highest education level: Not on file  Occupational History  . Occupation: Chartered certified accountant    Comment: [employer]  . Financial resource strain: Somewhat hard  . Food insecurity    Worry: Never true    Inability: Never true  . Transportation needs    Medical: No    Non-medical: No  Tobacco Use  . Smoking status: Never Smoker  . Smokeless tobacco: Never Used  Substance and Sexual Activity  . Alcohol use: No  . Drug use: No  . Sexual activity: Yes    Birth control/protection: None  Lifestyle  . Physical activity    Days per week: 0 days    Minutes per session: 0 min  . Stress: Rather much  Relationships  . Social connections    Talks on phone: More than three times a week    Gets together: More than three times a week    Attends religious service: More than 4 times per year    Active member of club or organization: Yes    Attends meetings of clubs or organizations: 1 to 4 times per year    Relationship status: Never married  Other Topics Concern  . Not on file  Social History Narrative  . Not on file    Family History  Problem Relation Age of Onset  . Diabetes Maternal Uncle   . Kidney disease Maternal Uncle        renal failure/dialysis  . Heart attack Maternal Grandmother       Blood pressure (!) 161/90, pulse 97, temperature 98.1 F (36.7 C), temperature source Oral, resp. rate 17, height 5\' 4"  (1.626 m), weight (!) 172 kg, last menstrual period 04/29/2018, SpO2 100 %. Exam Physical Exam  Physical Exam  Vitals reviewed. Constitutional: She is oriented to person, place, and time. She appears well-developed and well-nourished.  HENT:  Head: Normocephalic and atraumatic.  Right Ear: External ear normal.  Left Ear: External ear normal.  Nose: Nose normal.  Mouth/Throat: Oropharynx is clear and moist.  Eyes: Conjunctivae and EOM are normal. Pupils are equal, round, and reactive to light. Right eye exhibits no discharge. Left eye exhibits no  discharge. No scleral icterus.  Neck: Normal range of motion. Neck supple. No tracheal deviation present. No thyromegaly present.  Cardiovascular: Normal rate, regular rhythm, normal heart sounds and intact distal pulses.  Exam reveals no gallop and no friction rub.   No murmur heard. Respiratory: Effort normal and breath sounds normal. No respiratory distress. She has no wheezes. She has no rales. She exhibits no tenderness.  GI: Soft. Bowel sounds are normal. She exhibits no distension and no mass. There is tenderness. There is no rebound and no guarding.  Genitourinary:  Vulva is normal without lesions Vagina is pink moist without discharge Cervix normal in appearance and pap is normal Uterus is size consistent with 32 weeks, obese Adnexa is negative with normal sized ovaries by sonogram  Musculoskeletal: Normal range of motion. She exhibits no edema and no tenderness.  Neurological: She is alert and oriented to person, place, and time. She has normal reflexes. She displays normal reflexes. No cranial nerve deficit. She exhibits normal muscle tone. Coordination normal.  Skin: Skin is warm and dry. No rash noted. No erythema. No pallor.  Psychiatric: She has a normal mood and affect. Her behavior is normal. Judgment and thought content normal.    Prenatal labs: ABO, Rh: --/--/A POS (08/20 0449) Antibody: NEG (08/20 0449) Rubella: <0.90 (03/11 1549) RPR: Non Reactive (07/14 0958)  HBsAg: Negative (03/11 1549)  HIV: Non Reactive (07/14 0958)  GBS: Negative (08/09 0000)   Assessment/Plan: [redacted]w[redacted]d Estimated Date of Delivery: 02/03/19  Pre eclampsia with FGR and AEDF Dramatically elevated MSAFP, normal genetics Significant BP elevation from her previous status  S/p betamethasone x 2, no rescue inidicated if delivers at 34 weeks Twice weekly Dopplers Plan delivery at 34 weeks or as clinically indicated  In house management  Florian Buff 12/17/2018, 9:41 PM

## 2018-12-17 NOTE — Progress Notes (Signed)
Patient not on fetal monitoring at the moment due to bedside BPP being performed.

## 2018-12-18 ENCOUNTER — Encounter (HOSPITAL_COMMUNITY): Payer: Self-pay | Admitting: General Practice

## 2018-12-18 ENCOUNTER — Encounter (HOSPITAL_COMMUNITY)
Admission: AD | Disposition: A | Discharge status: Home / Self Care | Payer: Self-pay | Source: Home / Self Care | Attending: Obstetrics & Gynecology

## 2018-12-18 ENCOUNTER — Inpatient Hospital Stay (HOSPITAL_COMMUNITY): Payer: Medicaid Other | Admitting: Anesthesiology

## 2018-12-18 ENCOUNTER — Inpatient Hospital Stay (HOSPITAL_COMMUNITY): Payer: Medicaid Other

## 2018-12-18 DIAGNOSIS — O1494 Unspecified pre-eclampsia, complicating childbirth: Secondary | ICD-10-CM

## 2018-12-18 DIAGNOSIS — Z3A33 33 weeks gestation of pregnancy: Secondary | ICD-10-CM

## 2018-12-18 DIAGNOSIS — Z30430 Encounter for insertion of intrauterine contraceptive device: Secondary | ICD-10-CM

## 2018-12-18 DIAGNOSIS — O365930 Maternal care for other known or suspected poor fetal growth, third trimester, not applicable or unspecified: Secondary | ICD-10-CM

## 2018-12-18 LAB — SARS CORONAVIRUS 2 (TAT 6-24 HRS): SARS Coronavirus 2: NEGATIVE

## 2018-12-18 SURGERY — Surgical Case
Anesthesia: General

## 2018-12-18 MED ORDER — ONDANSETRON HCL 4 MG/2ML IJ SOLN
INTRAMUSCULAR | Status: AC
  Filled 2018-12-18: qty 2

## 2018-12-18 MED ORDER — OXYCODONE-ACETAMINOPHEN 5-325 MG PO TABS
1.0000 | ORAL_TABLET | ORAL | Status: DC | PRN
  Administered 2018-12-19 – 2018-12-20 (×6): 2 via ORAL
  Administered 2018-12-20: 07:00:00 1 via ORAL
  Administered 2018-12-20 – 2018-12-21 (×4): 2 via ORAL
  Filled 2018-12-18 (×11): qty 2

## 2018-12-18 MED ORDER — SOD CITRATE-CITRIC ACID 500-334 MG/5ML PO SOLN
30.0000 mL | ORAL | Status: DC | PRN
  Administered 2018-12-18: 12:00:00 30 mL via ORAL
  Filled 2018-12-18: qty 30

## 2018-12-18 MED ORDER — ONDANSETRON HCL 4 MG/2ML IJ SOLN: 4.0000 mg | Freq: Four times a day (QID) | INTRAMUSCULAR | Status: DC | PRN

## 2018-12-18 MED ORDER — PHENYLEPHRINE 40 MCG/ML (10ML) SYRINGE FOR IV PUSH (FOR BLOOD PRESSURE SUPPORT)
PREFILLED_SYRINGE | INTRAVENOUS | Status: AC
  Filled 2018-12-18: qty 10

## 2018-12-18 MED ORDER — CYCLOBENZAPRINE HCL 10 MG PO TABS: 10.0000 mg | ORAL_TABLET | Freq: Three times a day (TID) | ORAL | Status: DC | PRN

## 2018-12-18 MED ORDER — EPHEDRINE 5 MG/ML INJ
INTRAVENOUS | Status: AC
  Filled 2018-12-18: qty 10

## 2018-12-18 MED ORDER — HYDROCODONE-ACETAMINOPHEN 7.5-325 MG PO TABS: 1.0000 | ORAL_TABLET | Freq: Once | ORAL | Status: DC | PRN

## 2018-12-18 MED ORDER — COCONUT OIL OIL
1.0000 "application " | TOPICAL_OIL | Status: DC | PRN
  Administered 2018-12-21: 1 via TOPICAL

## 2018-12-18 MED ORDER — LIDOCAINE HCL (CARDIAC) PF 100 MG/5ML IV SOSY
PREFILLED_SYRINGE | INTRAVENOUS | Status: DC | PRN
  Administered 2018-12-18: 100 mg via INTRAVENOUS

## 2018-12-18 MED ORDER — HYDROMORPHONE HCL 1 MG/ML IJ SOLN
INTRAMUSCULAR | Status: AC
  Filled 2018-12-18: qty 1

## 2018-12-18 MED ORDER — SIMETHICONE 80 MG PO CHEW
80.0000 mg | CHEWABLE_TABLET | Freq: Three times a day (TID) | ORAL | Status: DC
  Administered 2018-12-19 – 2018-12-21 (×6): 80 mg via ORAL
  Filled 2018-12-18 (×7): qty 1

## 2018-12-18 MED ORDER — DEXAMETHASONE SODIUM PHOSPHATE 4 MG/ML IJ SOLN
INTRAMUSCULAR | Status: AC
  Filled 2018-12-18: qty 1

## 2018-12-18 MED ORDER — PHENYLEPHRINE HCL (PRESSORS) 10 MG/ML IV SOLN
INTRAVENOUS | Status: DC | PRN
  Administered 2018-12-18 (×4): 80 ug via INTRAVENOUS

## 2018-12-18 MED ORDER — LACTATED RINGERS IV SOLN
INTRAVENOUS | Status: DC
  Administered 2018-12-18: 12:00:00 via INTRAVENOUS

## 2018-12-18 MED ORDER — ZOLPIDEM TARTRATE 5 MG PO TABS
5.0000 mg | ORAL_TABLET | Freq: Every evening | ORAL | Status: DC | PRN
  Administered 2018-12-18 – 2018-12-20 (×3): 5 mg via ORAL
  Filled 2018-12-18 (×3): qty 1

## 2018-12-18 MED ORDER — DIPHENHYDRAMINE HCL 25 MG PO CAPS: 25.0000 mg | ORAL_CAPSULE | Freq: Four times a day (QID) | ORAL | Status: DC | PRN

## 2018-12-18 MED ORDER — PROMETHAZINE HCL 25 MG/ML IJ SOLN: 6.2500 mg | INTRAMUSCULAR | Status: DC | PRN

## 2018-12-18 MED ORDER — WITCH HAZEL-GLYCERIN EX PADS: 1.0000 "application " | MEDICATED_PAD | CUTANEOUS | Status: DC | PRN

## 2018-12-18 MED ORDER — FENTANYL CITRATE (PF) 250 MCG/5ML IJ SOLN
INTRAMUSCULAR | Status: AC
  Filled 2018-12-18: qty 5

## 2018-12-18 MED ORDER — BUPIVACAINE HCL (PF) 0.5 % IJ SOLN
INTRAMUSCULAR | Status: DC | PRN
  Administered 2018-12-18: 20 mL

## 2018-12-18 MED ORDER — ENOXAPARIN SODIUM 100 MG/ML ~~LOC~~ SOLN: 0.5000 mg/kg | SUBCUTANEOUS | Status: DC

## 2018-12-18 MED ORDER — MENTHOL 3 MG MT LOZG
1.0000 | LOZENGE | OROMUCOSAL | Status: DC | PRN
  Administered 2018-12-19: 3 mg via ORAL
  Filled 2018-12-18: qty 9

## 2018-12-18 MED ORDER — TRANEXAMIC ACID-NACL 1000-0.7 MG/100ML-% IV SOLN
INTRAVENOUS | Status: AC
  Filled 2018-12-18: qty 100

## 2018-12-18 MED ORDER — SCOPOLAMINE 1 MG/3DAYS TD PT72
MEDICATED_PATCH | TRANSDERMAL | Status: AC
  Filled 2018-12-18: qty 1

## 2018-12-18 MED ORDER — STERILE WATER FOR IRRIGATION IR SOLN
Status: DC | PRN
  Administered 2018-12-18: 1000 mL

## 2018-12-18 MED ORDER — OXYTOCIN 40 UNITS IN NORMAL SALINE INFUSION - SIMPLE MED
INTRAVENOUS | Status: AC
  Filled 2018-12-18: qty 1000

## 2018-12-18 MED ORDER — SENNOSIDES-DOCUSATE SODIUM 8.6-50 MG PO TABS
2.0000 | ORAL_TABLET | ORAL | Status: DC
  Administered 2018-12-18 – 2018-12-20 (×3): 2 via ORAL
  Filled 2018-12-18 (×3): qty 2

## 2018-12-18 MED ORDER — KETOROLAC TROMETHAMINE 30 MG/ML IJ SOLN
INTRAMUSCULAR | Status: AC
  Filled 2018-12-18: qty 1

## 2018-12-18 MED ORDER — LEVONORGESTREL 19.5 MCG/DAY IU IUD
INTRAUTERINE_SYSTEM | Freq: Once | INTRAUTERINE | Status: AC
  Administered 2018-12-18: 13:00:00 via INTRAUTERINE

## 2018-12-18 MED ORDER — LACTATED RINGERS IV SOLN: INTRAVENOUS | Status: DC

## 2018-12-18 MED ORDER — OXYTOCIN 40 UNITS IN NORMAL SALINE INFUSION - SIMPLE MED: 2.5000 [IU]/h | INTRAVENOUS | Status: DC

## 2018-12-18 MED ORDER — HYDROMORPHONE HCL 1 MG/ML IJ SOLN
INTRAMUSCULAR | Status: AC
  Filled 2018-12-18: qty 0.5

## 2018-12-18 MED ORDER — ATROPINE SULFATE 0.4 MG/ML IJ SOLN
INTRAMUSCULAR | Status: DC | PRN
  Administered 2018-12-18: 0.4 mg via INTRAVENOUS

## 2018-12-18 MED ORDER — KETOROLAC TROMETHAMINE 30 MG/ML IJ SOLN
30.0000 mg | Freq: Once | INTRAMUSCULAR | Status: AC
  Administered 2018-12-18: 16:00:00 30 mg via INTRAVENOUS

## 2018-12-18 MED ORDER — LEVONORGESTREL 19.5 MCG/DAY IU IUD
INTRAUTERINE_SYSTEM | INTRAUTERINE | Status: DC | PRN
  Administered 2018-12-18: 1 via INTRAUTERINE

## 2018-12-18 MED ORDER — OXYTOCIN BOLUS FROM INFUSION: 500.0000 mL | Freq: Once | INTRAVENOUS | Status: DC

## 2018-12-18 MED ORDER — CYCLOBENZAPRINE HCL 10 MG PO TABS
10.0000 mg | ORAL_TABLET | Freq: Three times a day (TID) | ORAL | Status: DC | PRN
  Administered 2018-12-19 – 2018-12-21 (×4): 10 mg via ORAL
  Filled 2018-12-18 (×5): qty 1

## 2018-12-18 MED ORDER — IBUPROFEN 800 MG PO TABS
800.0000 mg | ORAL_TABLET | Freq: Four times a day (QID) | ORAL | Status: DC
  Administered 2018-12-19 – 2018-12-21 (×7): 800 mg via ORAL
  Filled 2018-12-18 (×7): qty 1

## 2018-12-18 MED ORDER — LACTATED RINGERS IV BOLUS
1000.0000 mL | Freq: Once | INTRAVENOUS | Status: AC
  Administered 2018-12-18: 400 mL via INTRAVENOUS
  Administered 2018-12-18: 1000 mL via INTRAVENOUS

## 2018-12-18 MED ORDER — TRANEXAMIC ACID-NACL 1000-0.7 MG/100ML-% IV SOLN
INTRAVENOUS | Status: DC | PRN
  Administered 2018-12-18: 1000 mg via INTRAVENOUS

## 2018-12-18 MED ORDER — ACETAMINOPHEN 10 MG/ML IV SOLN
INTRAVENOUS | Status: AC
  Filled 2018-12-18: qty 100

## 2018-12-18 MED ORDER — SUCCINYLCHOLINE CHLORIDE 20 MG/ML IJ SOLN
INTRAMUSCULAR | Status: DC | PRN
  Administered 2018-12-18: 160 mg via INTRAVENOUS

## 2018-12-18 MED ORDER — ACETAMINOPHEN 10 MG/ML IV SOLN
1000.0000 mg | Freq: Once | INTRAVENOUS | Status: DC | PRN
  Administered 2018-12-18: 14:00:00 1000 mg via INTRAVENOUS

## 2018-12-18 MED ORDER — DEXTROSE 5 % IV SOLN
INTRAVENOUS | Status: AC
  Filled 2018-12-18: qty 3000

## 2018-12-18 MED ORDER — SIMETHICONE 80 MG PO CHEW
80.0000 mg | CHEWABLE_TABLET | ORAL | Status: DC
  Administered 2018-12-18 – 2018-12-20 (×3): 80 mg via ORAL
  Filled 2018-12-18 (×3): qty 1

## 2018-12-18 MED ORDER — SODIUM CHLORIDE 0.9 % IV SOLN
INTRAVENOUS | Status: DC | PRN
  Administered 2018-12-18: 40 [IU] via INTRAVENOUS

## 2018-12-18 MED ORDER — PROPOFOL 10 MG/ML IV BOLUS
INTRAVENOUS | Status: AC
  Filled 2018-12-18: qty 20

## 2018-12-18 MED ORDER — KETOROLAC TROMETHAMINE 30 MG/ML IJ SOLN
30.0000 mg | Freq: Four times a day (QID) | INTRAMUSCULAR | Status: AC
  Administered 2018-12-18 – 2018-12-19 (×4): 30 mg via INTRAVENOUS
  Filled 2018-12-18 (×4): qty 1

## 2018-12-18 MED ORDER — HYDROMORPHONE HCL 1 MG/ML IJ SOLN
1.0000 mg | INTRAMUSCULAR | Status: DC | PRN
  Administered 2018-12-19 – 2018-12-20 (×2): 2 mg via INTRAVENOUS
  Filled 2018-12-18 (×3): qty 2

## 2018-12-18 MED ORDER — SCOPOLAMINE 1 MG/3DAYS TD PT72
MEDICATED_PATCH | TRANSDERMAL | Status: DC | PRN
  Administered 2018-12-18: 1 via TRANSDERMAL

## 2018-12-18 MED ORDER — PHENYLEPHRINE HCL-NACL 20-0.9 MG/250ML-% IV SOLN: INTRAVENOUS | Status: DC | PRN

## 2018-12-18 MED ORDER — SODIUM CHLORIDE 0.9 % IV SOLN
INTRAVENOUS | Status: DC | PRN
  Administered 2018-12-18: 12:00:00 via INTRAVENOUS

## 2018-12-18 MED ORDER — LACTATED RINGERS IV SOLN: 500.0000 mL | INTRAVENOUS | Status: DC | PRN

## 2018-12-18 MED ORDER — MIDAZOLAM HCL 2 MG/2ML IJ SOLN
INTRAMUSCULAR | Status: AC
  Filled 2018-12-18: qty 2

## 2018-12-18 MED ORDER — DEXAMETHASONE SODIUM PHOSPHATE 4 MG/ML IJ SOLN
INTRAMUSCULAR | Status: DC | PRN
  Administered 2018-12-18: 4 mg via INTRAVENOUS

## 2018-12-18 MED ORDER — HYDROXYZINE HCL 10 MG PO TABS
10.0000 mg | ORAL_TABLET | Freq: Three times a day (TID) | ORAL | Status: DC | PRN
  Administered 2018-12-18 – 2018-12-21 (×5): 10 mg via ORAL
  Filled 2018-12-18 (×7): qty 1

## 2018-12-18 MED ORDER — ENOXAPARIN SODIUM 40 MG/0.4ML ~~LOC~~ SOLN
40.0000 mg | Freq: Once | SUBCUTANEOUS | Status: AC
  Administered 2018-12-18: 06:00:00 40 mg via SUBCUTANEOUS
  Filled 2018-12-18: qty 0.4

## 2018-12-18 MED ORDER — SIMETHICONE 80 MG PO CHEW
80.0000 mg | CHEWABLE_TABLET | ORAL | Status: DC | PRN
  Administered 2018-12-18 – 2018-12-20 (×4): 80 mg via ORAL
  Filled 2018-12-18 (×3): qty 1

## 2018-12-18 MED ORDER — DEXTROSE 5 % IV SOLN
3.0000 g | INTRAVENOUS | Status: AC
  Administered 2018-12-18: 3 g via INTRAVENOUS
  Filled 2018-12-18: qty 3000

## 2018-12-18 MED ORDER — FENTANYL CITRATE (PF) 100 MCG/2ML IJ SOLN
INTRAMUSCULAR | Status: DC | PRN
  Administered 2018-12-18: 50 ug via INTRAVENOUS
  Administered 2018-12-18: 100 ug via INTRAVENOUS
  Administered 2018-12-18: 250 ug via INTRAVENOUS
  Administered 2018-12-18: 100 ug via INTRAVENOUS

## 2018-12-18 MED ORDER — PROPOFOL 10 MG/ML IV BOLUS
INTRAVENOUS | Status: DC | PRN
  Administered 2018-12-18: 300 mg via INTRAVENOUS
  Administered 2018-12-18: 30 mg via INTRAVENOUS

## 2018-12-18 MED ORDER — HYDROMORPHONE HCL 1 MG/ML IJ SOLN
INTRAMUSCULAR | Status: DC | PRN
  Administered 2018-12-18 (×2): 1 mg via INTRAVENOUS

## 2018-12-18 MED ORDER — DIBUCAINE (PERIANAL) 1 % EX OINT: 1.0000 "application " | TOPICAL_OINTMENT | CUTANEOUS | Status: DC | PRN

## 2018-12-18 MED ORDER — ENOXAPARIN SODIUM 100 MG/ML ~~LOC~~ SOLN
90.0000 mg | SUBCUTANEOUS | Status: DC
  Administered 2018-12-19 – 2018-12-21 (×3): 90 mg via SUBCUTANEOUS
  Filled 2018-12-18 (×4): qty 0.9

## 2018-12-18 MED ORDER — SODIUM CHLORIDE 0.9 % IR SOLN
Status: DC | PRN
  Administered 2018-12-18: 1000 mL

## 2018-12-18 MED ORDER — PRENATAL MULTIVITAMIN CH
1.0000 | ORAL_TABLET | Freq: Every day | ORAL | Status: DC
  Administered 2018-12-20 – 2018-12-21 (×2): 1 via ORAL
  Filled 2018-12-18 (×2): qty 1

## 2018-12-18 MED ORDER — OXYTOCIN 40 UNITS IN NORMAL SALINE INFUSION - SIMPLE MED
2.5000 [IU]/h | INTRAVENOUS | Status: AC
  Administered 2018-12-18: 2.5 [IU]/h via INTRAVENOUS
  Filled 2018-12-18: qty 1000

## 2018-12-18 MED ORDER — MEPERIDINE HCL 25 MG/ML IJ SOLN: 6.2500 mg | INTRAMUSCULAR | Status: DC | PRN

## 2018-12-18 MED ORDER — HYDROMORPHONE HCL 1 MG/ML IJ SOLN
0.2500 mg | INTRAMUSCULAR | Status: DC | PRN
  Administered 2018-12-18 (×2): 0.5 mg via INTRAVENOUS

## 2018-12-18 MED ORDER — LIDOCAINE HCL (PF) 1 % IJ SOLN: 30.0000 mL | INTRAMUSCULAR | Status: DC | PRN

## 2018-12-18 MED ORDER — BUPIVACAINE HCL (PF) 0.5 % IJ SOLN
INTRAMUSCULAR | Status: AC
  Filled 2018-12-18: qty 30

## 2018-12-18 MED ORDER — PHENYLEPHRINE HCL-NACL 20-0.9 MG/250ML-% IV SOLN
INTRAVENOUS | Status: DC | PRN
  Administered 2018-12-18: 60 ug/min via INTRAVENOUS

## 2018-12-18 MED ORDER — PHENYLEPHRINE HCL-NACL 20-0.9 MG/250ML-% IV SOLN
INTRAVENOUS | Status: AC
  Filled 2018-12-18: qty 250

## 2018-12-18 MED ORDER — EPHEDRINE SULFATE 50 MG/ML IJ SOLN
INTRAMUSCULAR | Status: DC | PRN
  Administered 2018-12-18 (×2): 10 mg via INTRAVENOUS

## 2018-12-18 MED ORDER — ACETAMINOPHEN 325 MG PO TABS: 650.0000 mg | ORAL_TABLET | ORAL | Status: DC | PRN

## 2018-12-18 MED ORDER — LEVONORGESTREL 19.5 MCG/DAY IU IUD
INTRAUTERINE_SYSTEM | INTRAUTERINE | Status: AC
  Filled 2018-12-18: qty 1

## 2018-12-18 SURGICAL SUPPLY — 38 items
BARRIER ADHS 3X4 INTERCEED (GAUZE/BANDAGES/DRESSINGS) IMPLANT
BENZOIN TINCTURE PRP APPL 2/3 (GAUZE/BANDAGES/DRESSINGS) ×1 IMPLANT
CHLORAPREP W/TINT 26ML (MISCELLANEOUS) ×2 IMPLANT
CLAMP CORD UMBIL (MISCELLANEOUS) IMPLANT
CLOTH BEACON ORANGE TIMEOUT ST (SAFETY) ×2 IMPLANT
DRSG OPSITE POSTOP 4X10 (GAUZE/BANDAGES/DRESSINGS) ×2 IMPLANT
ELECT REM PT RETURN 9FT ADLT (ELECTROSURGICAL) ×2
ELECTRODE REM PT RTRN 9FT ADLT (ELECTROSURGICAL) ×1 IMPLANT
EXTRACTOR VACUUM KIWI (MISCELLANEOUS) ×1 IMPLANT
GLOVE BIO SURGEON STRL SZ 6.5 (GLOVE) ×2 IMPLANT
GLOVE BIOGEL PI IND STRL 7.0 (GLOVE) ×2 IMPLANT
GLOVE BIOGEL PI INDICATOR 7.0 (GLOVE) ×2
GOWN STRL REUS W/TWL LRG LVL3 (GOWN DISPOSABLE) ×4 IMPLANT
HOVERMATT SINGLE USE (MISCELLANEOUS) ×1 IMPLANT
KIT ABG SYR 3ML LUER SLIP (SYRINGE) ×1 IMPLANT
NDL HYPO 25X5/8 SAFETYGLIDE (NEEDLE) IMPLANT
NEEDLE HYPO 22GX1.5 SAFETY (NEEDLE) ×1 IMPLANT
NEEDLE HYPO 25X5/8 SAFETYGLIDE (NEEDLE) ×2 IMPLANT
NS IRRIG 1000ML POUR BTL (IV SOLUTION) ×2 IMPLANT
PACK C SECTION WH (CUSTOM PROCEDURE TRAY) ×2 IMPLANT
PAD ABD 7.5X8 STRL (GAUZE/BANDAGES/DRESSINGS) ×2 IMPLANT
PAD OB MATERNITY 4.3X12.25 (PERSONAL CARE ITEMS) ×2 IMPLANT
PENCIL SMOKE EVAC W/HOLSTER (ELECTROSURGICAL) ×2 IMPLANT
RETAINER VISCERAL (MISCELLANEOUS) ×1 IMPLANT
RETRACTOR TRAXI PANNICULUS (MISCELLANEOUS) IMPLANT
RETRACTOR WND ALEXIS 25 LRG (MISCELLANEOUS) IMPLANT
RTRCTR WOUND ALEXIS 25CM LRG (MISCELLANEOUS)
STRIP CLOSURE SKIN 1/2X4 (GAUZE/BANDAGES/DRESSINGS) ×1 IMPLANT
SUT PLAIN 2 0 XLH (SUTURE) ×1 IMPLANT
SUT VIC AB 0 CT1 36 (SUTURE) ×12 IMPLANT
SUT VIC AB 2-0 CT1 27 (SUTURE) ×1
SUT VIC AB 2-0 CT1 TAPERPNT 27 (SUTURE) ×1 IMPLANT
SUT VIC AB 4-0 PS2 27 (SUTURE) ×2 IMPLANT
SYR CONTROL 10ML LL (SYRINGE) ×1 IMPLANT
TOWEL OR 17X24 6PK STRL BLUE (TOWEL DISPOSABLE) ×2 IMPLANT
TRAXI PANNICULUS RETRACTOR (MISCELLANEOUS) ×1
TRAY FOLEY W/BAG SLVR 14FR LF (SET/KITS/TRAYS/PACK) IMPLANT
WATER STERILE IRR 1000ML POUR (IV SOLUTION) ×2 IMPLANT

## 2018-12-18 NOTE — Anesthesia Procedure Notes (Signed)
Procedure Name: Intubation Date/Time: 12/18/2018 12:16 PM Performed by: Elenore Paddy, CRNA Pre-anesthesia Checklist: Patient identified, Emergency Drugs available, Suction available, Patient being monitored and Timeout performed Patient Re-evaluated:Patient Re-evaluated prior to induction Oxygen Delivery Method: Circle system utilized Preoxygenation: Pre-oxygenation with 100% oxygen Induction Type: IV induction, Rapid sequence and Cricoid Pressure applied Grade View: Grade I Tube type: Oral Number of attempts: 1 Airway Equipment and Method: Video-laryngoscopy Placement Confirmation: ETT inserted through vocal cords under direct vision,  positive ETCO2 and breath sounds checked- equal and bilateral Secured at: 22 cm Tube secured with: Tape Dental Injury: Teeth and Oropharynx as per pre-operative assessment

## 2018-12-18 NOTE — Op Note (Signed)
Janice Taylor PROCEDURE DATE: 12/18/2018  PREOPERATIVE DIAGNOSES: Intrauterine pregnancy at [redacted]w[redacted]d weeks gestation; non-reassuring fetal status, undesired fertility  POSTOPERATIVE DIAGNOSES: The same  PROCEDURE: Primary Low Transverse Cesarean Section, Post-placental IUD insertion  SURGEON:  Dr. Emeterio Reeve  ASSISTANT:  Dr. Dione Plover  INDICATIONS: Janice Taylor is a 28 y.o. [id number] at [redacted]w[redacted]d here for cesarean section secondary to the indications listed under preoperative diagnoses; please see preoperative note for further details.  The risks of surgery were discussed with the patient including but were not limited to: bleeding which may require transfusion or reoperation; infection which may require antibiotics; injury to bowel, bladder, ureters or other surrounding organs; injury to the fetus; need for additional procedures including hysterectomy in the event of a life-threatening hemorrhage; placental abnormalities wth subsequent pregnancies, incisional problems, thromboembolic phenomenon and other postoperative/anesthesia complications.  Patient also desires IUD placement.  Other reversible forms of contraception were discussed with patient; she declines all other modalities. Risks of spontaneous expulsion, irregular bleeding was also discussed with patient.  The patient concurred with the proposed plan, giving informed written consent for the procedures.    FINDINGS:  Viable female infant in cephalic presentation.  Apgars 1 and 6 and 8.  Clear amniotic fluid.  Intact placenta, three vessel cord.  Normal uterus, fallopian tubes and ovaries bilaterally. Liletta IUD placed post placentally.  ANESTHESIA: General anesthesia INTRAVENOUS FLUIDS: 1500 ml ESTIMATED BLOOD LOSS: 176 ml URINE OUTPUT:  10 ml SPECIMENS: Placenta sent to L&D COMPLICATIONS: None immediate  PROCEDURE IN DETAIL:  The patient preoperatively received intravenous antibiotics and had sequential compression devices applied to her  lower extremities.   She was then taken to the operating room where general anesthesia was administered due to patient having received therapeutic level Lovenox earlier in the day. She was then placed in a dorsal supine position with a leftward tilt, and prepped and draped in a sterile manner.  A foley catheter was placed into her bladder and attached to constant gravity.  After an adequate timeout was performed, a Pfannenstiel skin incision was made with scalpel and carried through to the underlying layer of fascia. The fascia was incised in the midline, and this incision was extended bilaterally using the Mayo scissors.  Kocher clamps were applied to the superior aspect of the fascial incision and the underlying rectus muscles were dissected off bluntly. A similar process was carried out on the inferior aspect of the fascial incision. The rectus muscles were separated in the midline bluntly and the peritoneum was entered bluntly. Attention was turned to the lower uterine segment where a low transverse hysterotomy was made with a scalpel and extended bilaterally bluntly. A vertical T incision was made using bandage scissors to facilitate delivery of the infant.  The infant was successfully delivered with use of Kiwi with pop off x1, the cord was clamped and cut immediately and the infant was handed over to awaiting neonatology team. Uterine massage was then administered, and the placenta delivered intact with a three-vessel cord. The uterus was then cleared of clot and debris. A Liletta IUD was then placed manually at the uterine fundus and the strings were passed through the cervical os. The hysterotomy was closed with 0 Vicryl in a running locked fashion, and an imbricating layer was also placed with 0 Vicryl.  The pelvis was cleared of all clot and debris. Hemostasis was confirmed on all surfaces.  Attempt was made to close the peritoneum unsuccessfully due to body habitus. The fascia was then closed using  0  Vicryl in a running fashion.  The subcutaneous layer was irrigated, then reapproximated with 2-0 plain gut interrupted stitches, and 30 ml of 0.5% Marcaine was injected subcutaneously around the incision.  The skin was closed with a 4-0 Vicryl subcuticular stitch. The patient tolerated the procedure well. Sponge, lap, instrument and needle counts were correct x 3.  She was taken to the recovery room in stable condition.   Clarnce Flock, MD 12/18/2018 1:46 PM

## 2018-12-18 NOTE — Progress Notes (Addendum)
Patient ID: Janice Taylor, female   DOB: February 25, 1991, 28 y.o.   MRN: [id number] [place]) NOTE  Haddie Laskin is a 28 y.o. G3P1011 with Estimated Date of Delivery: 02/03/19   By   [redacted]w[redacted]d  who is admitted for pre eclampsia with FGR AEDF intermittent Fetal variables/prolonged decels no bradycardia.    Fetal presentation is cephalic. By sonogram Length of Stay:  1  Days  Date of admission:12/17/2018  Subjective: Mild headache, no CNS symptoms Patient reports the fetal movement as active. Patient reports uterine contraction  activity as none. Patient reports  vaginal bleeding as none. Patient describes fluid per vagina as None.  Vitals:  Blood pressure 132/69, pulse 92, temperature 98.2 F (36.8 C), temperature source Oral, resp. rate 18, height 5\' 4"  (1.626 m), weight (!) 172 kg, last menstrual period 04/29/2018, SpO2 96 %. Vitals:   12/17/18 2016 12/17/18 2135 12/17/18 2320 12/18/18 0350  BP: (!) 161/90 (!) 153/91 (!) 141/81 132/69  Pulse: 97 90 86 92  Resp:  20 16 18   Temp:  98.4 F (36.9 C) 97.8 F (36.6 C) 98.2 F (36.8 C)  TempSrc:  Oral Oral Oral  SpO2:  97% 98% 96%  Weight:      Height:       Physical Examination:  General appearance - alert, well appearing, and in no distress Abdomen - soft, nontender, nondistended, no masses or organomegaly Fundal Height:  size equals dates Pelvic Exam:  examination not indicated Cervical Exam: Not evaluated.  Extremities: extremities normal, atraumatic, no cyanosis or edema with DTRs 2+ bilaterally Membranes:intact  Fetal Monitoring:  Baseline: 130-140 bpm, Variability: Good {> 6 bpm), Accelerations: Reactive and Decelerations: Variable: moderate   reactive  Labs:  Results for orders placed or performed during the hospital encounter of 12/17/18 (from the past 24 hour(s))  CBC   Collection Time: 12/17/18  7:00 PM  Result Value Ref Range   WBC 11.4 (H) 4.0 - 10.5 K/uL   RBC 4.07 3.87 - 5.11 MIL/uL   Hemoglobin 12.3 12.0 - 15.0 g/dL   HCT 37.8 36.0 - 46.0 %   MCV 92.9 80.0 - 100.0 fL   MCH 30.2 26.0 - 34.0 pg   MCHC 32.5 30.0 - 36.0 g/dL   RDW 12.8 11.5 - 15.5 %   Platelets 182 150 - 400 K/uL   nRBC 0.0 0.0 - 0.2 %  Comprehensive metabolic panel   Collection Time: 12/17/18  7:00 PM  Result Value Ref Range   Sodium 134 (L) 135 - 145 mmol/L   Potassium 4.6 3.5 - 5.1 mmol/L   Chloride 103 98 - 111 mmol/L   CO2 20 (L) 22 - 32 mmol/L   Glucose, Bld 157 (H) 70 - 99 mg/dL   BUN 9 6 - 20 mg/dL   Creatinine, Ser 0.93 0.44 - 1.00 mg/dL   Calcium 8.6 (L) 8.9 - 10.3 mg/dL   Total Protein 5.8 (L) 6.5 - 8.1 g/dL   Albumin 2.6 (L) 3.5 - 5.0 g/dL   AST 15 15 - 41 U/L   ALT 16 0 - 44 U/L   Alkaline Phosphatase 102 38 - 126 U/L   Total Bilirubin 0.4 0.3 - 1.2 mg/dL   GFR calc non Af Amer >60 >60 mL/min   GFR calc Af Amer >60 >60 mL/min   Anion gap 11 5 - 15  Protein / creatinine ratio, urine   Collection Time: 12/17/18  7:33 PM  Result Value Ref Range   Creatinine, Urine 149.99 mg/dL  Total Protein, Urine 27 mg/dL   Protein Creatinine Ratio 0.18 (H) 0.00 - 0.15 mg/mg[Cre]  SARS CORONAVIRUS 2 Nasal Swab Aptima Multi Swab   Collection Time: 12/17/18  8:28 PM   Specimen: Aptima Multi Swab; Nasal Swab  Result Value Ref Range   SARS Coronavirus 2 NEGATIVE NEGATIVE  Type and screen [place]   Collection Time: 12/17/18 10:18 PM  Result Value Ref Range   ABO/RH(D) A POS    Antibody Screen NEG    Sample Expiration      12/20/2018,2359 Performed at [facility], [place] [address] [place], [place] [address]     Imaging Studies:      Medications:  Scheduled . cyclobenzaprine  10 mg Oral TID  . docusate sodium  100 mg Oral Daily  . enoxaparin (LOVENOX) injection  0.5 mg/kg Subcutaneous Q24H  . labetalol  200 mg Oral TID  . prenatal multivitamin  1 tablet Oral Q1200   I have reviewed the patient's current medications.  ASSESSMENT: [id number] [redacted]w[redacted]d Estimated  Date of Delivery: 02/03/19  Patient Active Problem List   Diagnosis Date Noted  . IUGR (intrauterine growth restriction) affecting care of mother, first trimester, fetus 1 12/17/2018  . Pregnancy affected by fetal growth restriction 12/17/2018  . IUGR (intrauterine growth restriction) affecting care of mother, third trimester 12/08/2018  . Low amniotic fluid, third trimester, fetus 1 12/08/2018  . Generalized anxiety disorder 12/02/2018  . Marginal insertion of umbilical cord affecting management of mother 11/27/2018  . Anxiety 11/07/2018  . Gestational hypertension 11/06/2018  . Abnormal chromosomal and genetic finding on antenatal screening mother 09/26/2018  . Supervision of high risk pregnancy, antepartum 07/05/2018  . Maternal morbid obesity with BMI 50, antepartum ([place]) 01/05/2013  . History of ITP 11/25/2011    PLAN: >Dopplers today >continue continuous monitoring >Discuss IV access   Florian Buff 12/18/2018,7:52 AM   Dr Donalee Citrin reviewed the patient's fetal heart rate tracing and recommended delivery today rather than waiting. I offered cervical ripening and IOL with the caveat of possible fetal intolerance that may require cesarean section and she voiced understanding  Woodroe Mode, MD 12/18/2018 9:19 AM

## 2018-12-18 NOTE — Transfer of Care (Signed)
Immediate Anesthesia Transfer of Care Note  Patient: Janice Taylor  Procedure(s) Performed: CESAREAN SECTION (N/A )  Patient Location: PACU  Anesthesia Type:General  Level of Consciousness: awake, alert  and oriented  Airway & Oxygen Therapy: Patient Spontanous Breathing  Post-op Assessment: Report given to RN and Post -op Vital signs reviewed and stable  Post vital signs: Reviewed and stable  Last Vitals:  Vitals Value Taken Time  BP 147/79 12/18/18 1415  Temp    Pulse 100 12/18/18 1417  Resp 19 12/18/18 1417  SpO2 94 % 12/18/18 1417  Vitals shown include unvalidated device data.  Last Pain:  Vitals:   12/18/18 0840  TempSrc: Oral  PainSc:       Patients Stated Pain Goal: 1 ([id number])  Complications: No apparent anesthesia complications

## 2018-12-18 NOTE — Anesthesia Preprocedure Evaluation (Signed)
Anesthesia Evaluation  Patient identified by MRN, date of birth, ID band Patient awake    Reviewed: Allergy & Precautions, NPO status , Patient's Chart, lab work & pertinent test results  Airway Mallampati: III  TM Distance: >3 FB Neck ROM: Full    Dental no notable dental hx. (+) Teeth Intact   Pulmonary neg pulmonary ROS,    Pulmonary exam normal breath sounds clear to auscultation       Cardiovascular Exercise Tolerance: Good hypertension, Pt. on medications and Pt. on home beta blockers Normal cardiovascular exam Rhythm:Regular Rate:Normal     Neuro/Psych PSYCHIATRIC DISORDERS Anxiety Depression ADD   GI/Hepatic negative GI ROS, Neg liver ROS,   Endo/Other  negative endocrine ROS  Renal/GU negative Renal ROS     Musculoskeletal   Abdominal   Peds  Hematology  (+) Blood dyscrasia, ,   Anesthesia Other Findings   Reproductive/Obstetrics (+) Pregnancy                             Anesthesia Physical Anesthesia Plan  ASA: IV  Anesthesia Plan: General   Post-op Pain Management:    Induction: Intravenous, Rapid sequence and Cricoid pressure planned  PONV Risk Score and Plan: 3 and Treatment may vary due to age or medical condition, Ondansetron and Dexamethasone  Airway Management Planned: Oral ETT  Additional Equipment:   Intra-op Plan:   Post-operative Plan: Extubation in OR  Informed Consent: I have reviewed the patients History and Physical, chart, labs and discussed the procedure including the risks, benefits and alternatives for the proposed anesthesia with the patient or authorized representative who has indicated his/her understanding and acceptance.     Dental advisory given  Plan Discussed with:   Anesthesia Plan Comments:         Anesthesia Quick Evaluation

## 2018-12-18 NOTE — Progress Notes (Signed)
Patient ID: Janice Taylor, female   DOB: Apr 22, 1991, 28 y.o.   MRN: [id number] FHR tracing shows repetitive variables, non-reassuring for IOL. I recommend cesarean section. The risks of cesarean section discussed with the patient included but were not limited to: bleeding which may require transfusion or reoperation; infection which may require antibiotics; injury to bowel, bladder, ureters or other surrounding organs; injury to the fetus; need for additional procedures including hysterectomy in the event of a life-threatening hemorrhage; placental abnormalities wth subsequent pregnancies, incisional problems, thromboembolic phenomenon and other postoperative/anesthesia complications. The patient concurred with the proposed plan, giving informed written consent for the procedure.   Patient has been NPO since before midnight and she will remain NPO for procedure. Anesthesia and OR aware. Preoperative prophylactic antibiotics and SCDs ordered on call to the OR.  To OR when ready. General anesthesia due to recent Lovenox injection.  Woodroe Mode, MD 12/18/2018 11:39 AM

## 2018-12-18 NOTE — Discharge Summary (Signed)
Postpartum Discharge Summary     Patient Name: Janice Taylor DOB: 1990-06-10 MRN: [id number]  Date of admission: 12/17/2018 Delivering Provider: [place] Mode   Date of discharge: 12/21/2018  Admitting diagnosis: To be re-admitted Intrauterine pregnancy: [redacted]w[redacted]d     Secondary diagnosis:  Active Problems:   IUGR (intrauterine growth restriction) affecting care of mother, first trimester, fetus 1   Pregnancy affected by fetal growth restriction  Additional problems:  Anxiety Gestational hypertension History of ITP     Discharge diagnosis: Preterm Pregnancy Delivered and Gestational Hypertension                                                                                                Post partum procedures:Post-placental IUD placement [place])  Augmentation: n/a  Complications: None  Hospital course:  Induction of Labor With Cesarean Section  28 y.o. yo [phone] at [redacted]w[redacted]d was admitted to the hospital 12/17/2018 for fetal monitoring due to IUGR and gHTN with multiple severe range pressures but no evidence of end organ damage on CBC/CMP/UPCR in setting of having recently [facility] from [facility] specialty care. Plan had been to induce however on arrival to the floor noted to have multiple deep prolonged variables on FHT. The patient went for cesarean section due to Non-Reassuring FHR, and delivered a Viable infant,12/18/2018  Membrane Rupture Time/Date: 12:24 PM ,12/18/2018   She also had a post placental IUD placed. Details of operation can be found in separate operative Note.    Patient had an uncomplicated postpartum course. Her blood pressure was controlled with enalapril and HCTZ. She is ambulating, tolerating a regular diet, passing flatus, and urinating well.  Patient is discharged home in stable condition on 12/21/18. Discharge instructions and wound care reviewed with the patient                                 Magnesium Sulfate recieved: No BMZ received: Yes  Physical exam  Vitals:    12/20/18 1929 12/20/18 2318 12/21/18 0622 12/21/18 0824  BP: (!) 136/37 (!) 115/48 (!) 146/79 138/70  Pulse: (!) 113 (!) 105 100 97  Resp: 20 20 17 17   Temp: 98.3 F (36.8 C) 98.1 F (36.7 C) 98.3 F (36.8 C) 98.2 F (36.8 C)  TempSrc: Oral Oral Oral Oral  SpO2: 100% 100% 99% 98%  Weight:      Height:       General: alert, cooperative and no distress Lochia: appropriate Uterine Fundus: firm Incision: Dressing is clean, dry, and intact DVT Evaluation: No evidence of DVT seen on physical exam. Labs: Lab Results  Component Value Date   WBC 10.9 (H) 12/19/2018   HGB 10.7 (L) 12/19/2018   HCT 32.9 (L) 12/19/2018   MCV 92.4 12/19/2018   PLT 166 12/19/2018   CMP Latest Ref Rng & Units 12/17/2018  Glucose 70 - 99 mg/dL 157(H)  BUN 6 - 20 mg/dL 9  Creatinine 0.44 - 1.00 mg/dL 0.93  Sodium 135 - 145 mmol/L 134(L)  Potassium 3.5 - 5.1 mmol/L 4.6  Chloride 98 - 111  mmol/L 103  CO2 22 - 32 mmol/L 20(L)  Calcium 8.9 - 10.3 mg/dL 8.6(L)  Total Protein 6.5 - 8.1 g/dL 5.8(L)  Total Bilirubin 0.3 - 1.2 mg/dL 0.4  Alkaline Phos 38 - 126 U/L 102  AST 15 - 41 U/L 15  ALT 0 - 44 U/L 16    Discharge instruction: per After Visit Summary and "Baby and Me Booklet".  After visit meds:  Allergies as of 12/21/2018      Reactions   Codeine Nausea And Vomiting   Ondansetron Hcl Other (See Comments)   Not effective`      Medication List    STOP taking these medications   famotidine 20 MG tablet Commonly known as: PEPCID     TAKE these medications   citalopram 20 MG tablet Commonly known as: CELEXA Take 10mg  (1/2 tablet) daily x 1 week, then 20mg  (1 tablet) daily thereafter   cyclobenzaprine 10 MG tablet Commonly known as: FLEXERIL Take 1 tablet (10 mg total) by mouth 3 (three) times daily as needed for muscle spasms. What changed: Another medication with the same name was added. Make sure you understand how and when to take each.   cyclobenzaprine 10 MG tablet Commonly known  as: FLEXERIL Take 1 tablet (10 mg total) by mouth 3 (three) times daily as needed for muscle spasms. What changed: You were already taking a medication with the same name, and this prescription was added. Make sure you understand how and when to take each.   docusate sodium 100 MG capsule Commonly known as: COLACE Take 1 capsule (100 mg total) by mouth 2 (two) times daily as needed for mild constipation.   enalapril 10 MG tablet Commonly known as: VASOTEC Take 1 tablet (10 mg total) by mouth daily.   Flinstones Gummies Omega-3 DHA Chew Chew 1 tablet by mouth daily.   hydrochlorothiazide 25 MG tablet Commonly known as: HYDRODIURIL Take 1 tablet (25 mg total) by mouth daily.   hydrOXYzine 10 MG tablet Commonly known as: ATARAX/VISTARIL Take 1 tablet (10 mg total) by mouth 3 (three) times daily as needed for anxiety.   ibuprofen 800 MG tablet Commonly known as: ADVIL Take 1 tablet (800 mg total) by mouth every 6 (six) hours.   multivitamin-prenatal 27-0.8 MG Tabs tablet Take 1 tablet by mouth daily at 12 noon.   oxyCODONE-acetaminophen 5-325 MG tablet Commonly known as: PERCOCET/ROXICET Take 1 tablet by mouth every 4 (four) hours as needed for moderate pain or severe pain.   valACYclovir 1000 MG tablet Commonly known as: VALTREX Take 1 tablet (1,000 mg total) by mouth daily.   zolpidem 5 MG tablet Commonly known as: AMBIEN Take 1 tablet (5 mg total) by mouth at bedtime as needed for sleep. What changed: Another medication with the same name was added. Make sure you understand how and when to take each.   zolpidem 10 MG tablet Commonly known as: AMBIEN Take 0.5 tablets (5 mg total) by mouth at bedtime as needed for sleep. What changed: You were already taking a medication with the same name, and this prescription was added. Make sure you understand how and when to take each.       Diet: routine diet  Activity: Advance as tolerated. Pelvic rest for 6 weeks.    Outpatient follow up: on 8/31 for post op check Follow up Appt: Future Appointments  Date Time Provider [place]  12/25/2018  9:45 AM Florian Buff, MD [facility]   Follow up Visit: Follow-up  Information    [facility] Follow up.   Why: Follow up as scheduled on 8/31 Contact information: [place] [id number] [phone]           Please schedule this patient for Postpartum visit in: 1 week with the following provider: MD For C/S patients schedule nurse incision check in weeks 2 weeks: yes High risk pregnancy complicated by: HTN Delivery mode:  CS Anticipated Birth Control:  post placental IUD placed PP Procedures needed: BP check, incision check, IUD string check, BH  Schedule Integrated [place] visit: yes     Newborn Data: Live born female  Birth Weight: 2 lb 12.1 oz (1250 g) APGAR: 1, 6  Newborn Delivery   Birth date/time: 12/18/2018 12:27:00 Delivery type: C-Section, Vacuum Assisted Trial of labor: No C-section categorization: Primary      Baby Feeding: Bottle Disposition:NICU   12/21/2018 Mora Bellman, MD

## 2018-12-19 ENCOUNTER — Encounter: Payer: Medicaid Other | Admitting: Obstetrics & Gynecology

## 2018-12-19 ENCOUNTER — Other Ambulatory Visit: Payer: Medicaid Other

## 2018-12-19 LAB — CBC
HCT: 32.9 % — ABNORMAL LOW (ref 36.0–46.0)
Hemoglobin: 10.7 g/dL — ABNORMAL LOW (ref 12.0–15.0)
MCH: 30.1 pg (ref 26.0–34.0)
MCHC: 32.5 g/dL (ref 30.0–36.0)
MCV: 92.4 fL (ref 80.0–100.0)
Platelets: 166 10*3/uL (ref 150–400)
RBC: 3.56 MIL/uL — ABNORMAL LOW (ref 3.87–5.11)
RDW: 13.1 % (ref 11.5–15.5)
WBC: 10.9 10*3/uL — ABNORMAL HIGH (ref 4.0–10.5)
nRBC: 0 % (ref 0.0–0.2)

## 2018-12-19 MED ORDER — FUROSEMIDE 10 MG/ML IJ SOLN
20.0000 mg | Freq: Once | INTRAMUSCULAR | Status: AC
  Administered 2018-12-19: 16:00:00 20 mg via INTRAVENOUS
  Filled 2018-12-19: qty 2

## 2018-12-19 MED ORDER — ENALAPRIL MALEATE 5 MG PO TABS
10.0000 mg | ORAL_TABLET | Freq: Every day | ORAL | Status: DC
  Administered 2018-12-19 – 2018-12-21 (×3): 10 mg via ORAL
  Filled 2018-12-19 (×3): qty 2

## 2018-12-19 MED ORDER — PROMETHAZINE HCL 25 MG/ML IJ SOLN
25.0000 mg | Freq: Four times a day (QID) | INTRAMUSCULAR | Status: DC | PRN
  Administered 2018-12-19 – 2018-12-20 (×2): 25 mg via INTRAVENOUS
  Filled 2018-12-19 (×2): qty 1

## 2018-12-19 MED FILL — Levonorgestrel IUD 20.1 MCG/DAY (Initial) (52 MG Total): INTRAUTERINE | Qty: 1 | Status: AC

## 2018-12-19 NOTE — Progress Notes (Signed)
Subjective: Postpartum Day 1: Cesarean Delivery Patient reports incisional pain.  She is ambulating in the hall. She is voiding and tolerating a regular diet  Objective: Vital signs in last 24 hours: Temp:  [97.7 F (36.5 C)-98.4 F (36.9 C)] 97.8 F (36.6 C) (08/25 0800) Pulse Rate:  [78-118] 96 (08/25 0800) Resp:  [13-29] 18 (08/25 0800) BP: (124-156)/(54-90) 153/86 (08/25 0800) SpO2:  [90 %-99 %] 96 % (08/25 0800)  Physical Exam:  General: alert, cooperative and no distress Lochia: appropriate Uterine Fundus: firm Incision: dressing is clean, dry and intact DVT Evaluation: No evidence of DVT seen on physical exam.  Recent Labs    12/17/18 1900 12/19/18 0735  HGB 12.3 10.7*  HCT 37.8 32.9*    Assessment/Plan: Status post Cesarean section. Doing well postoperatively.  Will start vasotec 10 mg daily Continue monitoring BP closely Continue routine postpartum care.  Peggy Constant 12/19/2018, 10:05 AM

## 2018-12-19 NOTE — Anesthesia Postprocedure Evaluation (Signed)
Anesthesia Post Note  Patient: Janice Taylor  Procedure(s) Performed: CESAREAN SECTION (N/A )     Patient location during evaluation: Mother Baby Anesthesia Type: General Level of consciousness: oriented and awake and alert Pain management: pain level controlled Vital Signs Assessment: post-procedure vital signs reviewed and stable Respiratory status: spontaneous breathing and respiratory function stable Cardiovascular status: blood pressure returned to baseline and stable Postop Assessment: no headache, no backache, no apparent nausea or vomiting and able to ambulate Anesthetic complications: no    Last Vitals:  Vitals:   12/18/18 2231 12/19/18 0457  BP: (!) 146/58 (!) 155/68  Pulse: 92 (!) 114  Resp: 18 18  Temp: 36.5 C 36.7 C  SpO2: 98% 99%    Last Pain:  Vitals:   12/19/18 0457  TempSrc: Oral  PainSc:    Pain Goal: Patients Stated Pain Goal: 1 (12/17/18 2135)                 Barnet Glasgow

## 2018-12-19 NOTE — Plan of Care (Signed)
  Problem: Fluid Volume: Goal: Peripheral tissue perfusion will improve Outcome: Progressing   Problem: Clinical Measurements: Goal: Complications related to disease process, condition or treatment will be avoided or minimized Outcome: Progressing   Problem: Clinical Measurements: Goal: Cardiovascular complication will be avoided Outcome: Progressing   Problem: Education: Goal: Knowledge of disease or condition will improve Outcome: Completed/Met Goal: Knowledge of the prescribed therapeutic regimen will improve Outcome: Completed/Met   Problem: Education: Goal: Knowledge of General Education information will improve Description: Including pain rating scale, medication(s)/side effects and non-pharmacologic comfort measures Outcome: Completed/Met   Problem: Clinical Measurements: Goal: Ability to maintain clinical measurements within normal limits will improve Outcome: Completed/Met Goal: Diagnostic test results will improve Outcome: Completed/Met Goal: Respiratory complications will improve Outcome: Completed/Met   Problem: Activity: Goal: Risk for activity intolerance will decrease Outcome: Completed/Met   Problem: Nutrition: Goal: Adequate nutrition will be maintained Outcome: Completed/Met   Problem: Coping: Goal: Level of anxiety will decrease Outcome: Completed/Met   Problem: Elimination: Goal: Will not experience complications related to bowel motility Outcome: Completed/Met Goal: Will not experience complications related to urinary retention Outcome: Completed/Met   Problem: Pain Managment: Goal: General experience of comfort will improve Outcome: Completed/Met   Problem: Safety: Goal: Ability to remain free from injury will improve Outcome: Completed/Met   Problem: Education: Goal: Knowledge of condition will improve Outcome: Completed/Met   Problem: Activity: Goal: Will verbalize the importance of balancing activity with adequate rest  periods Outcome: Completed/Met Goal: Ability to tolerate increased activity will improve Outcome: Completed/Met   Problem: Coping: Goal: Ability to identify and utilize available resources and services will improve Outcome: Completed/Met   Problem: Life Cycle: Goal: Chance of risk for complications during the postpartum period will decrease Outcome: Completed/Met   Problem: Role Relationship: Goal: Ability to demonstrate positive interaction with newborn will improve Outcome: Completed/Met

## 2018-12-19 NOTE — Plan of Care (Signed)
  Problem: Fluid Volume: Goal: Peripheral tissue perfusion will improve Outcome: Progressing Note: 1+ generalized edema present. Continuing to monitor.    Problem: Clinical Measurements: Goal: Complications related to disease process, condition or treatment will be avoided or minimized Outcome: Progressing Note: Pt denies headache, blurred vision, RUQ pain.  1+ edema noted on assessment.  Blood pressures elevated- continuing to monitor.    Problem: Education: Goal: Knowledge of disease or condition will improve Outcome: Completed/Met Goal: Knowledge of the prescribed therapeutic regimen will improve Outcome: Completed/Met Goal: Individualized Educational Video(s) Outcome: Completed/Met   Problem: Activity: Goal: Will verbalize the importance of balancing activity with adequate rest periods Outcome: Progressing Goal: Ability to tolerate increased activity will improve Outcome: Progressing Note: Pt ambulating frequently in halls, in room and to NICU.    Problem: Life Cycle: Goal: Chance of risk for complications during the postpartum period will decrease Outcome: Progressing Note: Family bonding delayed due to infant status.  Patient visiting NICU.    Problem: Skin Integrity: Goal: Demonstration of wound healing without infection will improve Outcome: Progressing Note: No signs of impaired healing at incision site. Pressure dressing in place.

## 2018-12-19 NOTE — Lactation Note (Signed)
This note was copied from a baby's chart. Lactation Consultation Note  Patient Name: Janice Taylor [id number] Date: 12/19/2018 Reason for consult: Initial assessment;NICU baby;Preterm <34wks;Infant < 6lbs  Baby is 16 hours old  [place] visited mom in her room 104 . Mom resting in bed and mentioned she was set up with a DEBP in the middle of the night and has pumped once with no results.  LC reviewed supply / demand and stressed the importance of being consistent with pumping 8-10 times a day around the clock.  Per mom already has spoke with [place] .  LC will fax a referral today and mom will call [place].  LC reviewed the set up of the DEBP . Mom was using the #27 F and LC decreased to the #24 F due to the #27 F being to big. Mom comfortable with the #24 F . Mom pumped @ the [facility] consult and was comfortable , only few drops.  LC reassured mom its up and down and will get more consistent as her milk comes in.  Mom has excessive generalized edema in hands and feet. Per mom was on B/P meds and was taken off of them.  Storage of breast milk reviewed.  Mom aware LC will F/U with her.  Mom already had the NICU lactation booklet.    Maternal Data Has patient been taught Hand Expression?: Yes Does the patient have breastfeeding experience prior to this delivery?: Yes  Feeding    LATCH Score                   Interventions Interventions: Breast feeding basics reviewed;DEBP  Lactation Tools Discussed/Used Tools: Pump;Flanges Flange Size: 24 Breast pump type: Double-Electric Breast Pump WIC Program: Yes Pump Review: Setup, frequency, and cleaning;Milk Storage Initiated by:: MAI - reviewed - pump was already set up - LC checked flange and decreased to #24 F better fit   Consult Status Consult Status: Follow-up Date: 12/20/18 Follow-up type: [place] 12/19/2018, 10:02 AM

## 2018-12-19 NOTE — Progress Notes (Signed)
Discussed importance of early pumping with patient. Patient currently does not want RN to set up pump and requesting rest. Will continue to assess readiness.

## 2018-12-20 LAB — RPR: RPR Ser Ql: NONREACTIVE

## 2018-12-20 MED ORDER — HYDROCHLOROTHIAZIDE 25 MG PO TABS
25.0000 mg | ORAL_TABLET | Freq: Every day | ORAL | Status: DC
  Administered 2018-12-20 – 2018-12-21 (×2): 25 mg via ORAL
  Filled 2018-12-20 (×2): qty 1

## 2018-12-20 MED ORDER — CYCLOBENZAPRINE HCL 10 MG PO TABS
10.0000 mg | ORAL_TABLET | Freq: Three times a day (TID) | ORAL | Status: DC | PRN
  Administered 2018-12-20: 23:00:00 10 mg via ORAL

## 2018-12-20 NOTE — Progress Notes (Signed)
Honeycomb dressing came off in shower.  Another honeycomb dressing applied.  Incision site CDI with steri strips.

## 2018-12-20 NOTE — Plan of Care (Deleted)
  Problem: Fluid Volume: Goal: Peripheral tissue perfusion will improve Outcome: Completed/Met   Problem: Clinical Measurements: Goal: Complications related to disease process, condition or treatment will be avoided or minimized Outcome: Completed/Met   Problem: Clinical Measurements: Goal: Complications related to the disease process, condition or treatment will be avoided or minimized Outcome: Completed/Met   Problem: Health Behavior/Discharge Planning: Goal: Ability to manage health-related needs will improve Outcome: Completed/Met   Problem: Clinical Measurements: Goal: Will remain free from infection Outcome: Completed/Met Goal: Cardiovascular complication will be avoided Outcome: Completed/Met   Problem: Skin Integrity: Goal: Risk for impaired skin integrity will decrease Outcome: Completed/Met   Problem: Education: Goal: Individualized Educational Video(s) Outcome: Completed/Met Goal: Individualized Newborn Educational Video(s) Outcome: Completed/Met   Problem: Skin Integrity: Goal: Demonstration of wound healing without infection will improve Outcome: Completed/Met   Patient completed antepartum care plan
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
## 2018-12-20 NOTE — Lactation Note (Signed)
This note was copied from a baby's chart. Lactation Consultation Note  Patient Name: Janice Taylor [id number] Date: 12/20/2018  Visited mom in the NICU.  Mom is not feeling well today.  Rest encouraged.  She is pumping every 3 hours but not obtaining milk.  Reassured and discussed milk coming to volume.  Mom knows to continue pumping and hand expressing even with no or little yield.  Mom recently held baby skin to skin.  Encouraged to call with concerns/assist prn.   Maternal Data    Feeding Feeding Type: Donor Breast Milk  LATCH Score                   Interventions    Lactation Tools Discussed/Used     Consult Status      Ave Filter 12/20/2018, 11:45 AM

## 2018-12-20 NOTE — Progress Notes (Signed)
Subjective: Postpartum Day 2: Cesarean Delivery Patient reports incisional pain.  She is ambulating, voiding and tolerating a regular diet  Objective: Vital signs in last 24 hours: Temp:  [98 F (36.7 C)-98.4 F (36.9 C)] 98 F (36.7 C) (08/26 0832) Pulse Rate:  [100-114] 100 (08/26 0832) Resp:  [18-20] 18 (08/26 0832) BP: (130-148)/(54-86) 148/85 (08/26 0832) SpO2:  [95 %-99 %] 97 % (08/26 [id number])  Physical Exam:  General: alert, cooperative and no distress Lochia: appropriate Uterine Fundus: firm Incision: honeycomb dressing clean, dry and intact DVT Evaluation: No evidence of DVT seen on physical exam.  Recent Labs    12/17/18 1900 12/19/18 0735  HGB 12.3 10.7*  HCT 37.8 32.9*    Assessment/Plan: Status post Cesarean section. Doing well postoperatively.  BP better controlled on vasotec Discharge planning tomorrow.  Peggy Constant 12/20/2018, 9:51 AM

## 2018-12-21 MED ORDER — IBUPROFEN 800 MG PO TABS: 800.0000 mg | ORAL_TABLET | Freq: Four times a day (QID) | ORAL | 0 refills | Status: DC

## 2018-12-21 MED ORDER — ENALAPRIL MALEATE 10 MG PO TABS: 10.0000 mg | ORAL_TABLET | Freq: Every day | ORAL | 1 refills | Status: DC

## 2018-12-21 MED ORDER — OXYCODONE-ACETAMINOPHEN 5-325 MG PO TABS: 1.0000 | ORAL_TABLET | ORAL | 0 refills | Status: DC | PRN

## 2018-12-21 MED ORDER — CYCLOBENZAPRINE HCL 10 MG PO TABS: 10.0000 mg | ORAL_TABLET | Freq: Three times a day (TID) | ORAL | 3 refills | Status: DC | PRN

## 2018-12-21 MED ORDER — HYDROCHLOROTHIAZIDE 25 MG PO TABS: 25.0000 mg | ORAL_TABLET | Freq: Every day | ORAL | 1 refills | Status: DC

## 2018-12-21 MED ORDER — MEASLES, MUMPS & RUBELLA VAC IJ SOLR
0.5000 mL | Freq: Once | INTRAMUSCULAR | Status: AC
  Administered 2018-12-21: 16:00:00 0.5 mL via SUBCUTANEOUS
  Filled 2018-12-21: qty 0.5

## 2018-12-21 MED ORDER — ZOLPIDEM TARTRATE 10 MG PO TABS: 5.0000 mg | ORAL_TABLET | Freq: Every evening | ORAL | 1 refills | Status: DC | PRN

## 2018-12-21 NOTE — Discharge Instructions (Signed)

## 2018-12-21 NOTE — Progress Notes (Signed)
Discharge instructions given to patient.  Discussed follow up appointment with Dr Elonda Husky.  Reviewed medication changes, postpartum care and breast feeding.  Baby blues discussed and emotional support given.

## 2018-12-21 NOTE — Progress Notes (Signed)
CSW acknowledges consult and completed a full psychosocial assessment on 12/19/2018. CSW provided PMADs education and will continue to follow MOB to provide resources/supports while infant is admitted to the NICU.   Abundio Miu, [place] Worker [facility] Cell#: [phone]

## 2018-12-21 NOTE — Lactation Note (Signed)
This note was copied from a baby's chart. Lactation Consultation Note  Patient Name: Janice Taylor [id number] Date: 12/21/2018 Reason for consult: Follow-up assessment;Preterm <34wks;NICU baby;Infant < 6lbs  P3 mother whose infant is now 18 hours old.  This is a preterm baby at 33+2 weeks weighing <6 lbs and in the NICU.  Mother breast fed her first child for 1 1/2 months.  Mother has been attempting to pump every three hours.  Reminded her of the importance of pumping 8-12 times/24 hours.  She has not seen any colostrum drops yet.  Reviewed doing hand expression before/after pumping to help increase milk supply.  Mother has colostrum containers at bedside.  Mother is willing to pump now.  Observed her pumping while discussing general breast feeding and pumping concepts.  Changed the left flange to a #27 for better fit and comfort.  During pumping the nipple enlarged enough to rub in the flange.  Mother stated no pain.  She will continue to use the #24 flange on her right breast.  Also asked RN to provide coconut oil for patient.  Mother will pump at least every three hours today.  Suggested she bring her pump parts to the NICU when she visits and pump at bedside.  Mother may do this.  Provided a bag for her parts and encouraged her to do lots of STS with her baby.  She stated that he did STS yesterday for almost 2 hours.  [place] referral faxed yesterday to [facility] for mother.  She will call for any further questions/concerns.  RN updated.   Maternal Data Has patient been taught Hand Expression?: Yes Does the patient have breastfeeding experience prior to this delivery?: Yes  Feeding Feeding Type: Donor Breast Milk  LATCH Score                   Interventions    Lactation Tools Discussed/Used WIC Program: Yes Pump Review: Setup, frequency, and cleaning(Reviewed) Initiated by::   Date initiated:: 12/21/18   Consult Status Consult Status: Follow-up Date:  12/22/18 Follow-up type: In-patient     R  12/21/2018, 8:10 AM

## 2018-12-22 ENCOUNTER — Telehealth: Payer: Self-pay | Admitting: Obstetrics & Gynecology

## 2018-12-22 ENCOUNTER — Encounter: Payer: Medicaid Other | Admitting: Advanced Practice Midwife

## 2018-12-22 ENCOUNTER — Other Ambulatory Visit: Payer: Medicaid Other

## 2018-12-22 MED ORDER — HYDROMORPHONE HCL 2 MG PO TABS: 2.0000 mg | ORAL_TABLET | Freq: Four times a day (QID) | ORAL | 0 refills | Status: DC | PRN

## 2018-12-22 NOTE — Telephone Encounter (Signed)
Meds ordered this encounter  Medications  . HYDROmorphone (DILAUDID) 2 MG tablet    Sig: Take 1-2 tablets (2-4 mg total) by mouth every 6 (six) hours as needed for severe pain.    Dispense:  30 tablet    Refill:  0

## 2018-12-22 NOTE — Telephone Encounter (Signed)
Medication is not working, she stated it's making her sick and not helping the pain.  She is wanting to get it changed.  Janice Taylor's in [place]

## 2018-12-25 ENCOUNTER — Other Ambulatory Visit: Payer: Self-pay

## 2018-12-25 ENCOUNTER — Ambulatory Visit (INDEPENDENT_AMBULATORY_CARE_PROVIDER_SITE_OTHER): Payer: Medicaid Other | Admitting: Obstetrics & Gynecology

## 2018-12-25 ENCOUNTER — Encounter: Payer: Self-pay | Admitting: Obstetrics & Gynecology

## 2018-12-25 VITALS — BP 129/77 | HR 110 | Ht 64.0 in | Wt 378.0 lb

## 2018-12-25 DIAGNOSIS — Z98891 History of uterine scar from previous surgery: Secondary | ICD-10-CM

## 2018-12-25 NOTE — Progress Notes (Signed)
  HPI: Patient returns for routine postoperative follow-up having undergone primary low transverse on 12/18/2018.  The patient's immediate postoperative recovery has been unremarkable. Since hospital discharge the patient reports no problems.   Current Outpatient Medications: citalopram (CELEXA) 20 MG tablet, Take 10mg  (1/2 tablet) daily x 1 week, then 20mg  (1 tablet) daily thereafter, Disp: 30 tablet, Rfl: 2 cyclobenzaprine (FLEXERIL) 10 MG tablet, Take 1 tablet (10 mg total) by mouth 3 (three) times daily as needed for muscle spasms., Disp: 30 tablet, Rfl: 3 docusate sodium (COLACE) 100 MG capsule, Take 1 capsule (100 mg total) by mouth 2 (two) times daily as needed for mild constipation., Disp: 30 capsule, Rfl: 2 enalapril (VASOTEC) 10 MG tablet, Take 1 tablet (10 mg total) by mouth daily., Disp: 30 tablet, Rfl: 1 hydrochlorothiazide (HYDRODIURIL) 25 MG tablet, Take 1 tablet (25 mg total) by mouth daily., Disp: 30 tablet, Rfl: 1 HYDROmorphone (DILAUDID) 2 MG tablet, Take 1-2 tablets (2-4 mg total) by mouth every 6 (six) hours as needed for severe pain., Disp: 30 tablet, Rfl: 0 hydrOXYzine (ATARAX/VISTARIL) 10 MG tablet, Take 1 tablet (10 mg total) by mouth 3 (three) times daily as needed for anxiety., Disp: 30 tablet, Rfl: 2 ibuprofen (ADVIL) 800 MG tablet, Take 1 tablet (800 mg total) by mouth every 6 (six) hours., Disp: 30 tablet, Rfl: 0 Prenatal Vit-Fe Fumarate-FA (MULTIVITAMIN-PRENATAL) 27-0.8 MG TABS tablet, Take 1 tablet by mouth daily at 12 noon., Disp: , Rfl:  zolpidem (AMBIEN) 10 MG tablet, Take 0.5 tablets (5 mg total) by mouth at bedtime as needed for sleep., Disp: 30 tablet, Rfl: 1  No current facility-administered medications for this visit.     Blood pressure 129/77, pulse (!) 110, height 5\' 4"  (1.626 m), weight (!) 378 lb (171.5 kg), last menstrual period 04/29/2018, currently breastfeeding.  Physical Exam: Scattered bruising  Incision clean dry intact Normal post op  abdominal exam  Diagnostic Tests:   Pathology:   Impression: S/p Caesarean section  Plan: Routine post op care  Follow up: 4  weeks  Florian Buff, MD

## 2018-12-27 ENCOUNTER — Ambulatory Visit: Payer: Self-pay

## 2018-12-27 NOTE — Lactation Note (Signed)
This note was copied from a baby's chart. Lactation Consultation Note  Patient Name: Janice Taylor [id number] Date: 12/27/2018 Reason for consult: 1st time breastfeeding;Mother's request;Preterm <34wks;NICU baby;Infant < 6lbs  NICU RN called for mom with questions regarding milk supply. Baby is 66 days old, delivered via C/S @ 33.2wks, low birth weight. This is mom's third baby, but first time breastfeeding.  LC entered room and notified by RN that baby just now permitted to nuzzle at breast. Mom sitting in chair at bedside in NICU with infant in cross-cradle position. Infant cuing and assisted to latch by LC. Infant suckled a few times and swallow audible. Infant appeared to become tired and was latched again with ease. Reassured mom that this is normal feeding behavior for 33wk baby. Leretha Dykes at bedside to observe feeding. Reviewed latch techniques and allowing baby time at the breast with anticipation of becoming tired quickly, but not to induce stress in infant. Reviewed positioning at the breast with nose/chin touching breast during feeding. Mom with very large breasts and large nipple. Encouraged mom to ensure all of her nipple is in baby's mouth during feeding. Infant fed intermittently for about 10 minutes before going to sleep.  Mom with questions regarding volume she gets with pumping. Mom states she is only filling up the small containers. Mom admits to pumping every 4 hours and not over night. Mom's medication list includes Enalapril with hydrochlorothiazide 25mg , Motrin, Dilaudid 2mg , Celexa, and Ambien each night.  Per [employer], hydromorphone use should not be used long term in breastfeeding mother and large doses of hydrochlorothiazide could decrease milk production. Mom takes recommended dose of 50mg  daily. Concerns regarding long-term dilaudid use relayed to Margurite Auerbach, RN who states she will communicate this information with rounding NP.  Encouraged mom to pump regularly q3hrs,  setting an alarm at night if necessary. Also encouraged mom to hydrate more with water which she admits she does not drink. New membranes provided for mom to change out on her DEBP at bedside. Mom denies any pain with pumping.  Encouraged mom to call for [facility] assistance in future.   Maternal Data Has patient been taught Hand Expression?: Yes(reviewed)  Feeding Feeding Type: Breast Fed  LATCH Score Latch: Grasps breast easily, tongue down, lips flanged, rhythmical sucking.  Audible Swallowing: A few with stimulation  Type of Nipple: Everted at rest and after stimulation  Comfort (Breast/Nipple): Soft / non-tender  Hold (Positioning): Full assist, staff holds infant at breast  LATCH Score: 7  Interventions Interventions: Breast feeding basics reviewed;Assisted with latch;Skin to skin;Breast massage;Hand express;Adjust position  Lactation Tools Discussed/Used WIC Program: Yes Pump Review: Setup, frequency, and cleaning   Consult Status Consult Status: PRN Follow-up type: In-patient    Cranston Neighbor 12/27/2018, 5:42 PM

## 2018-12-29 ENCOUNTER — Other Ambulatory Visit: Payer: Self-pay

## 2018-12-29 ENCOUNTER — Telehealth: Payer: Self-pay | Admitting: *Deleted

## 2018-12-29 MED ORDER — HYDROMORPHONE HCL 2 MG PO TABS: 2.0000 mg | ORAL_TABLET | Freq: Four times a day (QID) | ORAL | 0 refills | Status: DC | PRN

## 2018-12-29 NOTE — Telephone Encounter (Signed)
Pt states her c section incision is very tender. It hurts to touch. No redness. No fever. Pt is requesting a refill on Motrin and some pain med. Going to NICU everyday and she is not resting like she should. I spoke with Dr. Elonda Husky and he advised pt don't need an antibiotic. He has refilled Dilaudid and pt can take OTC Ibuprofen. Pt voiced understanding. [place]

## 2018-12-29 NOTE — Telephone Encounter (Signed)
Pt left message requesting a call back ?

## 2019-01-02 ENCOUNTER — Encounter: Payer: Self-pay | Admitting: *Deleted

## 2019-01-02 ENCOUNTER — Other Ambulatory Visit: Payer: Self-pay

## 2019-01-02 ENCOUNTER — Inpatient Hospital Stay (HOSPITAL_COMMUNITY)
Admission: AD | Admit: 2019-01-02 | Discharge: 2019-01-02 | Disposition: A | Discharge status: Home / Self Care | Payer: Medicaid Other | Attending: Family Medicine | Admitting: Family Medicine

## 2019-01-02 DIAGNOSIS — F419 Anxiety disorder, unspecified: Secondary | ICD-10-CM | POA: Insufficient documentation

## 2019-01-02 DIAGNOSIS — Z79899 Other long term (current) drug therapy: Secondary | ICD-10-CM | POA: Insufficient documentation

## 2019-01-02 DIAGNOSIS — F909 Attention-deficit hyperactivity disorder, unspecified type: Secondary | ICD-10-CM | POA: Diagnosis not present

## 2019-01-02 DIAGNOSIS — F329 Major depressive disorder, single episode, unspecified: Secondary | ICD-10-CM | POA: Diagnosis not present

## 2019-01-02 DIAGNOSIS — Z885 Allergy status to narcotic agent status: Secondary | ICD-10-CM | POA: Diagnosis not present

## 2019-01-02 DIAGNOSIS — D693 Immune thrombocytopenic purpura: Secondary | ICD-10-CM | POA: Insufficient documentation

## 2019-01-02 DIAGNOSIS — L7682 Other postprocedural complications of skin and subcutaneous tissue: Secondary | ICD-10-CM | POA: Diagnosis not present

## 2019-01-02 MED ORDER — KETOROLAC TROMETHAMINE 60 MG/2ML IM SOLN
60.0000 mg | Freq: Once | INTRAMUSCULAR | Status: AC
  Administered 2019-01-02: 60 mg via INTRAMUSCULAR
  Filled 2019-01-02: qty 2

## 2019-01-02 NOTE — Discharge Instructions (Signed)
Postpartum Care After Cesarean Delivery This sheet gives you information about how to care for yourself from the time you deliver your baby to up to 6-12 weeks after delivery (postpartum period). Your health care provider may also give you more specific instructions. If you have problems or questions, contact your health care provider. Follow these instructions at home: Medicines  Take over-the-counter and prescription medicines only as told by your health care provider.  If you were prescribed an antibiotic medicine, take it as told by your health care provider. Do not stop taking the antibiotic even if you start to feel better.  Ask your health care provider if the medicine prescribed to you: ? Requires you to avoid driving or using heavy machinery. ? Can cause constipation. You may need to take actions to prevent or treat constipation, such as:  Drink enough fluid to keep your urine pale yellow.  Take over-the-counter or prescription medicines.  Eat foods that are high in fiber, such as beans, whole grains, and fresh fruits and vegetables.  Limit foods that are high in fat and processed sugars, such as fried or sweet foods. Activity  Gradually return to your normal activities as told by your health care provider.  Avoid activities that take a lot of effort and energy (are strenuous) until approved by your health care provider. Walking at a slow to moderate pace is usually safe. Ask your health care provider what activities are safe for you. ? Do not lift anything that is heavier than your baby or 10 lb (4.5 kg) as told by your health care provider. ? Do not vacuum, climb stairs, or drive a car for as long as told by your health care provider.  If possible, have someone help you at home until you are able to do your usual activities yourself.  Rest as much as possible. Try to rest or take naps while your baby is sleeping. Vaginal bleeding  It is normal to have vaginal bleeding  (lochia) after delivery. Wear a sanitary pad to absorb vaginal bleeding and discharge. ? During the first week after delivery, the amount and appearance of lochia is often similar to a menstrual period. ? Over the next few weeks, it will gradually decrease to a dry, yellow-brown discharge. ? For most women, lochia stops completely by 4-6 weeks after delivery. Vaginal bleeding can vary from woman to woman.  Change your sanitary pads frequently. Watch for any changes in your flow, such as: ? A sudden increase in volume. ? A change in color. ? Large blood clots.  If you pass a blood clot, save it and call your health care provider to discuss. Do not flush blood clots down the toilet before you get instructions from your health care provider.  Do not use tampons or douches until your health care provider says this is safe.  If you are not breastfeeding, your period should return 6-8 weeks after delivery. If you are breastfeeding, your period may return anytime between 8 weeks after delivery and the time that you stop breastfeeding. Perineal care   If your C-section (Cesarean section) was unplanned, and you were allowed to labor and push before delivery, you may have pain, swelling, and discomfort of the tissue between your vaginal opening and your anus (perineum). You may also have an incision in the tissue (episiotomy) or the tissue may have torn during delivery. Follow these instructions as told by your health care provider: ? Keep your perineum clean and dry as told by  your health care provider. Use medicated pads and pain-relieving sprays and creams as directed. ? If you have an episiotomy or vaginal tear, check the area every day for signs of infection. Check for:  Redness, swelling, or pain.  Fluid or blood.  Warmth.  Pus or a bad smell. ? You may be given a squirt bottle to use instead of wiping to clean the perineum area after you go to the bathroom. As you start healing, you may use  the squirt bottle before wiping yourself. Make sure to wipe gently. ? To relieve pain caused by an episiotomy, vaginal tear, or hemorrhoids, try taking a warm sitz bath 2-3 times a day. A sitz bath is a warm water bath that is taken while you are sitting down. The water should only come up to your hips and should cover your buttocks. Breast care  Within the first few days after delivery, your breasts may feel heavy, full, and uncomfortable (breast engorgement). You may also have milk leaking from your breasts. Your health care provider can suggest ways to help relieve breast discomfort. Breast engorgement should go away within a few days.  If you are breastfeeding: ? Wear a bra that supports your breasts and fits you well. ? Keep your nipples clean and dry. Apply creams and ointments as told by your health care provider. ? You may need to use breast pads to absorb milk leakage. ? You may have uterine contractions every time you breastfeed for several weeks after delivery. Uterine contractions help your uterus return to its normal size. ? If you have any problems with breastfeeding, work with your health care provider or a Science writer.  If you are not breastfeeding: ? Avoid touching your breasts as this can make your breasts produce more milk. ? Wear a well-fitting bra and use cold packs to help with swelling. ? Do not squeeze out (express) milk. This causes you to make more milk. Intimacy and sexuality  Ask your health care provider when you can engage in sexual activity. This may depend on your: ? Risk of infection. ? Healing rate. ? Comfort and desire to engage in sexual activity.  You are able to get pregnant after delivery, even if you have not had your period. If desired, talk with your health care provider about methods of family planning or birth control (contraception). Lifestyle  Do not use any products that contain nicotine or tobacco, such as cigarettes, e-cigarettes,  and chewing tobacco. If you need help quitting, ask your health care provider.  Do not drink alcohol, especially if you are breastfeeding. Eating and drinking   Drink enough fluid to keep your urine pale yellow.  Eat high-fiber foods every day. These may help prevent or relieve constipation. High-fiber foods include: ? Whole grain cereals and breads. ? Brown rice. ? Beans. ? Fresh fruits and vegetables.  Take your prenatal vitamins until your postpartum checkup or until your health care provider tells you it is okay to stop. General instructions  Keep all follow-up visits for you and your baby as told by your health care provider. Most women visit their health care provider for a postpartum checkup within the first 3-6 weeks after delivery. Contact a health care provider if you:  Feel unable to cope with the changes that a new baby brings to your life, and these feelings do not go away.  Feel unusually sad or worried.  Have breasts that are painful, hard, or turn red.  Have a fever.  Have trouble holding urine or keeping urine from leaking. °· Have little or no interest in activities you used to enjoy. °· Have not breastfed at all and you have not had a menstrual period for 12 weeks after delivery. °· Have stopped breastfeeding and you have not had a menstrual period for 12 weeks after you stopped breastfeeding. °· Have questions about caring for yourself or your baby. °· Pass a blood clot from your vagina. °Get help right away if you: °· Have chest pain. °· Have difficulty breathing. °· Have sudden, severe leg pain. °· Have severe pain or cramping in your abdomen. °· Bleed from your vagina so much that you fill more than one sanitary pad in one hour. Bleeding should not be heavier than your heaviest period. °· Develop a severe headache. °· Faint. °· Have blurred vision or spots in your vision. °· Have a bad-smelling vaginal discharge. °· Have thoughts about hurting yourself or your  baby. °If you ever feel like you may hurt yourself or others, or have thoughts about taking your own life, get help right away. You can go to your nearest emergency department or call: °· Your local emergency services (911 in the [place] °· A suicide crisis helpline, such as the National Suicide Prevention Lifeline at 1-800-273-8255. This is open 24 hours a day. °Summary °· The period of time from when you deliver your baby to up to 6-12 weeks after delivery is called the postpartum period. °· Gradually return to your normal activities as told by your health care provider. °· Keep all follow-up visits for you and your baby as told by your health care provider. °This information is not intended to replace advice given to you by your health care provider. Make sure you discuss any questions you have with your health care provider. °Document Released: 04/09/2000 Document Revised: 11/30/2017 Document Reviewed: 11/30/2017 °Elsevier Patient Education © 2020 Elsevier Inc. ° °

## 2019-01-02 NOTE — MAU Provider Note (Signed)
History     CSN: [id number]  Arrival date and time: 01/02/19 1754   First Provider Initiated Contact with Patient 01/02/19 1852      Chief Complaint  Patient presents with  . Post-op Problem   HPI Janice Taylor is a 28 y.o. [id number] postpartum from a primary c/s on 8/24 who presents with incision pain. She states she is concerned that her incision is infected. She sent a picture via MyChart today to the office and reassurance was given of normalcy. Patient reports worsening pain at the site and wanted to be seen in person. She states she last took ibuprofen at 11am and has not taken anything else since then. She is requesting a refill of dilaudid too.   OB History    Gravida  3   Para  2   Term  1   Preterm  1   AB  1   Living  2     SAB  1   TAB      Ectopic      Multiple  0   Live Births  2           Past Medical History:  Diagnosis Date  . ADHD (attention deficit hyperactivity disorder)   . Anxiety   . Clotting disorder ([place])   . Cough   . Depression   . Genital HSV   . 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 Leg swelling   . Obesity 01/05/2013  . Thrombocytopenia ([place]) 11/25/2011   Consistent with ITP    Past Surgical History:  Procedure Laterality Date  . CESAREAN SECTION N/A 12/18/2018   Procedure: CESAREAN SECTION;  Surgeon: Woodroe Mode, MD;  Location: [facility] LD ORS;  Service: Obstetrics;  Laterality: N/A;  . TONSILECTOMY, ADENOIDECTOMY, BILATERAL MYRINGOTOMY AND TUBES      Family History  Problem Relation Age of Onset  . Diabetes Maternal Uncle   . Kidney disease Maternal Uncle        renal failure/dialysis  . Heart attack Maternal Grandmother     Social History   Tobacco Use  . Smoking status: Never Smoker  . Smokeless tobacco: Never Used  Substance Use Topics  . Alcohol use: No  . Drug use: No    Allergies:  Allergies  Allergen Reactions  . Codeine Nausea And Vomiting  . Ondansetron Hcl  Other (See Comments)    Not effective`    Medications Prior to Admission  Medication Sig Dispense Refill Last Dose  . citalopram (CELEXA) 20 MG tablet Take 10mg  (1/2 tablet) daily x 1 week, then 20mg  (1 tablet) daily thereafter 30 tablet 2 01/02/2019 at Unknown time  . cyclobenzaprine (FLEXERIL) 10 MG tablet Take 1 tablet (10 mg total) by mouth 3 (three) times daily as needed for muscle spasms. 30 tablet 3 01/02/2019 at Unknown time  . docusate sodium (COLACE) 100 MG capsule Take 1 capsule (100 mg total) by mouth 2 (two) times daily as needed for mild constipation. 30 capsule 2 01/02/2019 at Unknown time  . enalapril (VASOTEC) 10 MG tablet Take 1 tablet (10 mg total) by mouth daily. 30 tablet 1 01/02/2019 at Unknown time  . hydrochlorothiazide (HYDRODIURIL) 25 MG tablet Take 1 tablet (25 mg total) by mouth daily. 30 tablet 1 01/02/2019 at Unknown time  . HYDROmorphone (DILAUDID) 2 MG tablet Take 1-2 tablets (2-4 mg total) by mouth every 6 (six) hours as needed for severe pain. 15 tablet 0 Past Week at  Unknown time  . hydrOXYzine (ATARAX/VISTARIL) 10 MG tablet Take 1 tablet (10 mg total) by mouth 3 (three) times daily as needed for anxiety. 30 tablet 2 01/02/2019 at Unknown time  . ibuprofen (ADVIL) 800 MG tablet Take 1 tablet (800 mg total) by mouth every 6 (six) hours. 30 tablet 0 01/02/2019 at Unknown time  . Prenatal Vit-Fe Fumarate-FA (MULTIVITAMIN-PRENATAL) 27-0.8 MG TABS tablet Take 1 tablet by mouth daily at 12 noon.   01/02/2019 at Unknown time  . zolpidem (AMBIEN) 10 MG tablet Take 0.5 tablets (5 mg total) by mouth at bedtime as needed for sleep. 30 tablet 1 01/01/2019 at Unknown time    Review of Systems  Constitutional: Negative.  Negative for fatigue and fever.  HENT: Negative.   Respiratory: Negative.  Negative for shortness of breath.   Cardiovascular: Negative.  Negative for chest pain.  Gastrointestinal: Positive for abdominal pain. Negative for constipation, diarrhea, nausea and vomiting.        Incision pain  Genitourinary: Negative.  Negative for dysuria.  Neurological: Negative.  Negative for dizziness and headaches.   Physical Exam   Blood pressure 137/76, pulse (!) 115, temperature 98.6 F (37 C), temperature source Oral, resp. rate 20, SpO2 96 %, currently breastfeeding.  Physical Exam  Nursing note and vitals reviewed. Constitutional: She is oriented to person, place, and time. She appears well-developed and well-nourished. No distress.  HENT:  Head: Normocephalic.  Eyes: Pupils are equal, round, and reactive to light.  Cardiovascular: Normal rate, regular rhythm and normal heart sounds.  Respiratory: Effort normal and breath sounds normal. No respiratory distress.  GI: Soft. Bowel sounds are normal. She exhibits no distension. There is no abdominal tenderness.  Incision clean, dry and intact. One 1cm area with edges of incision not completely intact but no bleeding or purulent discharge.   Neurological: She is alert and oriented to person, place, and time.  Skin: Skin is warm and dry.  Psychiatric: She has a normal mood and affect. Her behavior is normal. Judgment and thought content normal.    MAU Course  Procedures  MDM Toradol IM  Discussed with patient importance of taking ibuprofen every 6hrs while incision is healing and using narcotic as needed when pain is worse.   Patient had dilaudid filled on 9/4. Instructed patient to call office for refill of narcotic if needed.  Lengthy discussion with patient of self care while healing from c/s and taking care of NICU infant.   Assessment and Plan   1. Incisional pain   2. Postpartum state    -Discharge home in stable condition -Encouraged patient to taken ibuprofen and narcotics as prescribed -Infection precautions discussed -Patient advised to follow-up with [facility] as scheduled for PP appointment  -Patient may return to MAU as needed or if her condition were to change or worsen   Wende Mott  CNM 01/02/2019, 6:52 PM

## 2019-01-02 NOTE — MAU Note (Signed)
Having a lot of incisional pain. Feels and looks like it is coming apart. Has been cleaning with it every day.  Left side is healing well, rt side is not. Very sore, not really draining.

## 2019-01-04 ENCOUNTER — Other Ambulatory Visit: Payer: Self-pay | Admitting: Obstetrics & Gynecology

## 2019-01-04 NOTE — Telephone Encounter (Signed)
This isi her last refill of dilaudid

## 2019-01-06 ENCOUNTER — Ambulatory Visit: Payer: Self-pay

## 2019-01-06 NOTE — Lactation Note (Signed)
This note was copied from a baby's chart. Lactation Consultation Note  Patient Name: Janice Taylor [id number] Date: 01/06/2019 Reason for consult: Follow-up assessment;NICU baby;Preterm <34wks;Infant < 6lbs;Mother's request  P2 mother whose infant is now 36 weeks old.  This is a preterm baby born at 33+2 weeks with a CGA of 36+0 weeks, weighing < 6 lbs and in the NICU.  RN requested latch assistance for the 1700 feeding.  When I arrived baby had just finished getting a bath.  Offered to assist with latching and mother agreeable.  Mother desired to dress him until I educated her about the benefits of feeding STS.    Mother's breasts are large, soft and non tender and nipples are everted and intact.  Discussed the preterm baby and expectations for breast feeding.  Educated mother on the importance of feeding STS and to expect breast feeding to be a process of learning for both mother and baby.  Asked mother to hand express EBM and I finger fed a couple drops of EBM while assessing his suck.  Infant was not very interested in sucking on my gloved finger.  Allowed time for him to practice sucking and soothing.  Attempted to latch to the right breast in the cross cradle hold without success.  Baby was not interested in opening mouth to latch.  He was very sleepy.  Explained to mother that this is typical behavior at this age and for the first day of breast feeding.  Praised her efforts for attempting to latch and stressed the importance of STS.  Mother dressed baby in a sleeper and I found a small hat for his head.  RN assisted with making a clean bed and baby was swaddled and fell asleep in the isolette.    Encouraged mother to call when latch assistance is needed.  RN in room and aware of feeding attempt.   Maternal Data Formula Feeding for Exclusion: No Has patient been taught Hand Expression?: Yes Does the patient have breastfeeding experience prior to this delivery?: Yes  Feeding Feeding Type:  Breast Fed  LATCH Score Latch: Too sleepy or reluctant, no latch achieved, no sucking elicited.  Audible Swallowing: None  Type of Nipple: Everted at rest and after stimulation  Comfort (Breast/Nipple): Soft / non-tender  Hold (Positioning): Assistance needed to correctly position infant at breast and maintain latch.  LATCH Score: 5  Interventions Interventions: Breast feeding basics reviewed;Assisted with latch;Skin to skin;Breast massage;Hand express;Support pillows;Adjust position;DEBP  Lactation Tools Discussed/Used Breast pump type: Double-Electric Breast Pump WIC Program: Yes   Consult Status Consult Status: PRN Date: 01/06/19 Follow-up type: Call as needed    Beth R DelFava 01/06/2019, 5:25 PM

## 2019-01-10 ENCOUNTER — Ambulatory Visit: Payer: Self-pay

## 2019-01-10 NOTE — Lactation Note (Signed)
This note was copied from a baby's chart. Lactation Consultation Note  Patient Name: Janice Taylor [id number] Date: 01/10/2019 Reason for consult: Follow-up assessment;NICU baby   Baby 52 weeks old and mother has been pumping approx 5-6 times per day and is worried about her supply.  Suggest a minimum of 8 times per day. Mother pumped approx 40 ml an hour ago. Encouraged nuzzling at breast prior to pumping.  Encouraged her to pump q 2.5-3 hours during the day and q 4 hours at night. Discussed hands on pumping to increase her supply.     Maternal Data    Feeding Feeding Type: Breast Milk Nipple Type: Nfant Extra Slow Flow (gold)  LATCH Score                   Interventions Interventions: Breast massage;DEBP  Lactation Tools Discussed/Used     Consult Status Consult Status: PRN    Carlye Grippe 01/10/2019, 3:57 PM

## 2019-01-14 ENCOUNTER — Other Ambulatory Visit: Payer: Self-pay

## 2019-01-17 ENCOUNTER — Ambulatory Visit: Payer: Self-pay

## 2019-01-17 NOTE — Lactation Note (Signed)
This note was copied from a baby's chart. Lactation Consultation Note  Patient Name: Janice Taylor [id number] Date: 01/17/2019 Reason for consult: Follow-up assessment;NICU baby  NICU RN requested [facility] consult with Mom.  Mom pumping in baby's room, concerned about her milk supply and nipple pain with pumping. Flange size changed to 30 mm and recommended organic coconut oil to lubricate nipple and areola prior to pumping.  Talked about increasing frequency of pumping to >8 times per 24 hrs.  Talked about power pumping to stimulate her milk supply. Talked about possible galactagogues and encouraged to her to research.  WIC recommended Fenugreek which she has been taking 1 capsule twice a day.  Explained that usual dose is 2-3 caps 3 times per day.   Mom to call prn.  Encouraged STS with baby and pumping at bedside as much as she can.  Interventions Interventions: Breast feeding basics reviewed;Skin to skin;Breast massage;Hand express;DEBP  Lactation Tools Discussed/Used Tools: Pump;Flanges Flange Size: 30 Breast pump type: Double-Electric Breast Pump   Consult Status Consult Status: Follow-up Date: 01/17/19 Follow-up type: Call as needed    Broadus John 01/17/2019, 6:28 PM

## 2019-01-18 ENCOUNTER — Ambulatory Visit (INDEPENDENT_AMBULATORY_CARE_PROVIDER_SITE_OTHER): Payer: Medicaid Other | Admitting: Obstetrics and Gynecology

## 2019-01-18 ENCOUNTER — Encounter: Payer: Self-pay | Admitting: Obstetrics and Gynecology

## 2019-01-18 ENCOUNTER — Other Ambulatory Visit: Payer: Self-pay

## 2019-01-18 VITALS — BP 143/85 | HR 118 | Ht 64.0 in | Wt 361.4 lb

## 2019-01-18 DIAGNOSIS — O909 Complication of the puerperium, unspecified: Secondary | ICD-10-CM

## 2019-01-18 MED ORDER — TRAMADOL HCL 50 MG PO TABS: 100.0000 mg | ORAL_TABLET | Freq: Four times a day (QID) | ORAL | 0 refills | Status: DC | PRN

## 2019-01-18 MED ORDER — ZOLPIDEM TARTRATE 10 MG PO TABS: 5.0000 mg | ORAL_TABLET | Freq: Every evening | ORAL | 1 refills | Status: DC | PRN

## 2019-01-18 NOTE — Progress Notes (Signed)
Patient ID: Yavonda Stariha, female   DOB: 1991/04/04, 28 y.o.   MRN: [id number]    [facility] Visit  @DATE @            Patient name: Janice Taylor MRN [id number]  Date of birth: Jul 22, 1990  CC & HPI:  Janice Taylor is a 28 y.o. female [place] presenting today for an evaluation of her c section incision. The pt notes that her incision is draining fluid with an odor. Her c section was completed on 12/18/2018. She notes that she has been keeping the incision clean but still feels some tenderness on the right side.  The pt reports that this pregnancy was her last. The patient denies fever, chills or any other symptoms or complaints at this time.   ROS:  ROS  + incision drainage + incision tenderness - fever - chills All systems are negative except as noted in the HPI and PMH.   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   Consistent with ITP    Leg swelling    Obesity 01/05/2013   Thrombocytopenia ([place]) 11/25/2011   Consistent with ITP                              Surgical Hx:    Past Surgical History:  Procedure Laterality Date   CESAREAN SECTION N/A 12/18/2018   Procedure: CESAREAN SECTION;  Surgeon: Woodroe Mode, MD;  Location: [facility] LD ORS;  Service: Obstetrics;  Laterality: N/A;   TONSILECTOMY, ADENOIDECTOMY, BILATERAL MYRINGOTOMY AND TUBES     Medications: Reviewed & Updated - see associated section                       Current Outpatient Medications:    citalopram (CELEXA) 20 MG tablet, Take 10mg  (1/2 tablet) daily x 1 week, then 20mg  (1 tablet) daily thereafter, Disp: 30 tablet, Rfl: 2   cyclobenzaprine (FLEXERIL) 10 MG tablet, Take 1 tablet (10 mg total) by mouth 3 (three) times daily as needed for muscle  spasms., Disp: 30 tablet, Rfl: 3   hydrOXYzine (ATARAX/VISTARIL) 10 MG tablet, Take 1 tablet (10 mg total) by mouth 3 (three) times daily as needed for anxiety., Disp: 30 tablet, Rfl: 2   ibuprofen (ADVIL) 800 MG tablet, Take 1 tablet (800 mg total) by mouth every 6 (six) hours., Disp: 30 tablet, Rfl: 0   Prenatal Vit-Fe Fumarate-FA (MULTIVITAMIN-PRENATAL) 27-0.8 MG TABS tablet, Take 1 tablet by mouth daily at 12 noon., Disp: , Rfl:    docusate sodium (COLACE) 100 MG capsule, Take 1 capsule (100 mg total) by mouth 2 (two) times daily as needed for mild constipation. (Patient not taking: Reported on 01/18/2019), Disp: 30 capsule, Rfl: 2   enalapril (VASOTEC) 10 MG tablet, Take 1 tablet (10 mg total) by mouth daily. (Patient not taking: Reported on 01/18/2019), Disp: 30 tablet, Rfl: 1   hydrochlorothiazide (HYDRODIURIL) 25 MG tablet, Take 1 tablet (25 mg total) by mouth daily. (Patient not taking: Reported on 01/18/2019), Disp: 30 tablet, Rfl: 1   HYDROmorphone (DILAUDID) 2 MG tablet, TAKE 1 TO 2 TABLETS BY MOUTH  EVERY 6 HOURS AS NEEDED FOR SEVERE PAIN. (Patient not taking: Reported on 01/18/2019), Disp: 15 tablet, Rfl: 0   zolpidem (AMBIEN) 10 MG tablet, Take 0.5 tablets (5 mg total) by mouth at bedtime as needed for sleep. (Patient not taking: Reported on 01/18/2019), Disp: 30 tablet, Rfl: 1   Social History: Reviewed -  reports that she has never smoked. She has never used smokeless tobacco.  Objective Findings:  Vitals: Blood pressure (!) 143/85, pulse (!) 118, height 5\' 4"  (1.626 m), weight (!) 361 lb 6.4 oz (163.9 kg), not currently breastfeeding.  PHYSICAL EXAMINATION General appearance - alert, well appearing, and in no distress, oriented to person, place, and time and overweight Mental status - alert, oriented to person, place, and time, normal mood, behavior, speech, dress, motor activity, and thought processes, affect appropriate to mood  PELVIC Incision: granulation tissue on right  side of incision  Assessment & Plan:   A:  1. Incision check 4 wks s/p c section 2. Area of granulation tissue removed along with 1 stitch. Hurricaine spray used. Silver Nitrate applied to incision site.  P:  1. F/U tomorrow (01/19/2019) 2. Rx Tramadol (pt denied Vicodin)  3. Refill Rx Ambien  By signing my name below, I, De Burrs, attest that this documentation has been prepared under the direction and in the presence of Jonnie Kind, MD. Electronically Signed: De Burrs, Medical Scribe. 01/18/19. 11:04 AM.  I personally performed the services described in this documentation, which was SCRIBED in my presence. The recorded information has been reviewed and considered accurate. It has been edited as necessary during review. Jonnie Kind, MD

## 2019-01-19 ENCOUNTER — Ambulatory Visit: Payer: Self-pay

## 2019-01-19 ENCOUNTER — Encounter: Payer: Self-pay | Admitting: Obstetrics and Gynecology

## 2019-01-19 ENCOUNTER — Ambulatory Visit (INDEPENDENT_AMBULATORY_CARE_PROVIDER_SITE_OTHER): Payer: Medicaid Other | Admitting: Obstetrics and Gynecology

## 2019-01-19 ENCOUNTER — Other Ambulatory Visit: Payer: Self-pay

## 2019-01-19 VITALS — BP 147/86 | HR 106 | Ht 64.0 in | Wt 366.2 lb

## 2019-01-19 DIAGNOSIS — O99210 Obesity complicating pregnancy, unspecified trimester: Secondary | ICD-10-CM

## 2019-01-19 DIAGNOSIS — Z9889 Other specified postprocedural states: Secondary | ICD-10-CM

## 2019-01-19 DIAGNOSIS — O909 Complication of the puerperium, unspecified: Secondary | ICD-10-CM | POA: Insufficient documentation

## 2019-01-19 NOTE — Progress Notes (Signed)
Patient ID: Janice Taylor, female   DOB: [birth year], 28 y.o.   MRN: [id number]    [place] [facility] Visit  @DATE @            Patient name: Janice Taylor MRN [id number]  Date of birth: [birth year]  CC & HPI:  Janice Taylor is a 28 y.o. female presenting today for incision check. S/p c-section 4 weeks, pt seen yesterday, area of right sided grandulation tissue which was removed, silver nitrate d. Has small area on left side of incision as well where suture irritation.  ROS:  ROS +right sided grandular tissue + 10 cm deep suture irritation -fever -chills  Pertinent History Reviewed:   Reviewed: Significant for c-section Medical         Past Medical History:  Diagnosis Date  . ADHD (attention deficit hyperactivity disorder)   . Anxiety   . Clotting disorder ([place])   . Cough   . Depression   . Genital HSV   . 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 Leg swelling   . Obesity 01/05/2013  . Thrombocytopenia ([place]) 11/25/2011   Consistent with ITP                              Surgical Hx:    Past Surgical History:  Procedure Laterality Date  . CESAREAN SECTION N/A 12/18/2018   Procedure: CESAREAN SECTION;  Surgeon: Woodroe Mode, MD;  Location: [facility] LD ORS;  Service: Obstetrics;  Laterality: N/A;  . TONSILECTOMY, ADENOIDECTOMY, BILATERAL MYRINGOTOMY AND TUBES     Medications: Reviewed & Updated - see associated section                       Current Outpatient Medications:  .  citalopram (CELEXA) 20 MG tablet, Take 10mg  (1/2 tablet) daily x 1 week, then 20mg  (1 tablet) daily thereafter, Disp: 30 tablet, Rfl: 2 .  cyclobenzaprine (FLEXERIL) 10 MG tablet, Take 1 tablet (10 mg total) by mouth 3 (three) times daily as needed for muscle spasms., Disp: 30 tablet, Rfl: 3 .  docusate sodium (COLACE) 100 MG capsule, Take 1 capsule (100 mg total) by mouth 2 (two) times daily as needed for mild constipation. (Patient not taking: Reported on 01/18/2019), Disp: 30  capsule, Rfl: 2 .  enalapril (VASOTEC) 10 MG tablet, Take 1 tablet (10 mg total) by mouth daily. (Patient not taking: Reported on 01/18/2019), Disp: 30 tablet, Rfl: 1 .  hydrochlorothiazide (HYDRODIURIL) 25 MG tablet, Take 1 tablet (25 mg total) by mouth daily. (Patient not taking: Reported on 01/18/2019), Disp: 30 tablet, Rfl: 1 .  HYDROmorphone (DILAUDID) 2 MG tablet, TAKE 1 TO 2 TABLETS BY MOUTH EVERY 6 HOURS AS NEEDED FOR SEVERE PAIN. (Patient not taking: Reported on 01/18/2019), Disp: 15 tablet, Rfl: 0 .  hydrOXYzine (ATARAX/VISTARIL) 10 MG tablet, Take 1 tablet (10 mg total) by mouth 3 (three) times daily as needed for anxiety., Disp: 30 tablet, Rfl: 2 .  ibuprofen (ADVIL) 800 MG tablet, Take 1 tablet (800 mg total) by mouth every 6 (six) hours., Disp: 30 tablet, Rfl: 0 .  Prenatal Vit-Fe Fumarate-FA (MULTIVITAMIN-PRENATAL) 27-0.8 MG TABS tablet, Take 1 tablet by mouth daily at 12 noon., Disp: , Rfl:  .  traMADol (ULTRAM) 50 MG tablet, Take 2 tablets (100 mg total) by mouth every 6 (six) hours as needed for moderate pain or severe pain., Disp: 30 tablet, Rfl:  0 .  zolpidem (AMBIEN) 10 MG tablet, Take 0.5 tablets (5 mg total) by mouth at bedtime as needed for sleep., Disp: 30 tablet, Rfl: 1   Social History: Reviewed -  reports that she has never smoked. She has never used smokeless tobacco.  Objective Findings:  Vitals: not currently breastfeeding.  PHYSICAL EXAMINATION General appearance - alert, well appearing, and in no distress and overweight Mental status - alert, oriented to person, place, and time, normal mood, behavior, speech, dress, motor activity, and thought processes  PELVIC Right sided grandular tissue excised 01/18/2019. Small area of grandular tissue on left side excised today. 10 cm deep suture removed from larger defer on and 2 cm removed from left.   Assessment & Plan:   A:  1.  S/p 4 week c-section 2. 10 cm deep suture removed on from right side incision defect, 2 cm  suture removed on right from sub q closure  P:  1.  2 weeks recheck 2. Pt to stabilize incision line with 4x4 in pannus crease and tape sanitary napkin transversely over crease to improve stability of wound . Daily dressing change.    By signing my name below, I, Samul Dada, attest that this documentation has been prepared under the direction and in the presence of Jonnie Kind, MD. Electronically Signed: [place]. 01/19/19. 9:25 AM.  I personally performed the services described in this documentation, which was SCRIBED in my presence. The recorded information has been reviewed and considered accurate. It has been edited as necessary during review. Jonnie Kind, MD

## 2019-01-19 NOTE — Lactation Note (Signed)
This note was copied from a baby's chart. Lactation Consultation Note  Patient Name: Janice Taylor [id number] Date: 01/19/2019 Reason for consult: Follow-up assessment  LC Follow Up Visit:  Mother called and requested another set of #30 flanges.  She stated it was getting too hard to keep bringing one set back and forth between the hospital and home.  I provided the flanges for mother.  Reviewed her pumping schedule.  Mother with no further questions/concerns at this time.   Consult Status Consult Status: PRN Date: 01/19/19 Follow-up type: Call as needed    Beth R DelFava 01/19/2019, 2:12 PM

## 2019-01-22 ENCOUNTER — Other Ambulatory Visit: Payer: Self-pay

## 2019-01-22 ENCOUNTER — Ambulatory Visit (INDEPENDENT_AMBULATORY_CARE_PROVIDER_SITE_OTHER): Payer: Medicaid Other | Admitting: Women's Health

## 2019-01-22 ENCOUNTER — Encounter: Payer: Self-pay | Admitting: Women's Health

## 2019-01-22 DIAGNOSIS — Z30430 Encounter for insertion of intrauterine contraceptive device: Secondary | ICD-10-CM | POA: Insufficient documentation

## 2019-01-22 DIAGNOSIS — Z98891 History of uterine scar from previous surgery: Secondary | ICD-10-CM

## 2019-01-22 DIAGNOSIS — Z8759 Personal history of other complications of pregnancy, childbirth and the puerperium: Secondary | ICD-10-CM

## 2019-01-22 NOTE — Patient Instructions (Signed)
Tips To Increase Milk Supply  Lots of water! Enough so that your urine is clear  Plenty of calories, if you're not getting enough calories, your milk supply can decrease  Breastfeed/pump often, every 2-3 hours x 20-30mins  Fenugreek 3 pills 3 times a day, this may make your urine smell like maple syrup  Mother's Milk Tea  Lactation cookies, google for the recipe  Real oatmeal   

## 2019-01-22 NOTE — Progress Notes (Signed)
POSTPARTUM VISIT Patient name: Janice Taylor MRN [id number]  Date of birth: 1991-03-09 Chief Complaint:   Postpartum Care  History of Present Illness:   Janice Taylor is a 28 y.o. [phone] Caucasian female being seen today for a postpartum visit. She is 5 weeks postpartum following a primary cesarean section, low transverse incision at 33.2 gestational weeks d/t NRFHR while inpatient for [facility] w/ FGR and absent EDF. Anesthesia: general. Laceration: n/a. I have fully reviewed the prenatal and intrapartum course. D/c'd on vasotec and hctz, stopped 3d ago.  Pregnancy complicated by [facility] dx @ 27wks, FGR w/ AEDF, admitted for inpatient monitoring. H/O ITP. Postpartum course has been complicated by c/s incision delayed healing. Bleeding no bleeding. Bowel function is normal. Bladder function is normal.  Patient is not sexually active. Last sexual activity: prior to birth of baby.  Contraception method is IUD, Liletta IUD inserted 12/18/18 at time of C/S   Last pap Jan 2020.  Results were normal .  No LMP recorded. (Menstrual status: IUD).  Baby's course has been complicated by prematurity, currently still in NICU, 4lbs now, still having trouble feeding. Baby is feeding by breast & bottle    Flavia Shipper Postpartum Depression Screening: negative, on celexa and vistaril, has her good days and bad days. Denies SI/HI/II. Doesn't want to adjust meds at this time.  Edinburgh Postnatal Depression Scale - 01/22/19 1213      Edinburgh Postnatal Depression Scale:  In the Past 7 Days   I have been able to laugh and see the funny side of things.  0    I have looked forward with enjoyment to things.  0    I have blamed myself unnecessarily when things went wrong.  1    I have been anxious or worried for no good reason.  3    I have felt scared or panicky for no good reason.  2    Things have been getting on top of me.  0    I have been so unhappy that I have had difficulty sleeping.  1    I have felt sad or  miserable.  1    I have been so unhappy that I have been crying.  1    The thought of harming myself has occurred to me.  0    Edinburgh Postnatal Depression Scale Total  9      Review of Systems:   Pertinent items are noted in HPI Denies Abnormal vaginal discharge w/ itching/odor/irritation, headaches, visual changes, shortness of breath, chest pain, abdominal pain, severe nausea/vomiting, or problems with urination or bowel movements. Pertinent History Reviewed:  Reviewed past medical,surgical, obstetrical and family history.  Reviewed problem list, medications and allergies. OB History  Gravida Para Term Preterm AB Living  3 2 1 1 1 2   SAB TAB Ectopic Multiple Live Births  1     0 2    # Outcome Date GA Lbr Len/2nd Weight Sex Delivery Anes PTL Lv  3 Preterm 12/18/18 [redacted]w[redacted]d  2 lb 12.1 oz (1.25 kg) M CS-Vac Gen  LIV  2 SAB 2019          1 Term 08/03/07 [redacted]w[redacted]d  6 lb 14 oz (3.118 kg) F Vag-Spont EPI  LIV   Physical Assessment:   Vitals:   01/22/19 1209  BP: 129/80  Pulse: (!) 114  Weight: (!) 366 lb 6.4 oz (166.2 kg)  Height: 5\' 4"  (1.626 m)  Body mass index is 62.89 kg/m.  Physical Examination:   General appearance: alert, well appearing, and in no distress  Mental status: alert, oriented to person, place, and time  Skin: warm & dry   Cardiovascular: normal heart rate noted   Respiratory: normal respiratory effort, no distress   Breasts: deferred, no complaints   Abdomen: soft, non-tender, c/s incision healing, ~1cm open area Rt side of incision  Pelvic: examination not indicated  Rectal: no hemorrhoids  Extremities: no edema       No results found for this or any previous visit (from the past 24 hour(s)).  Assessment & Plan:  1) Postpartum exam 2) 5 wks s/p PLTCS for NRFHR @ 33.2wks during hospitalization for GHTN/FGR/AEDF 3) Breast & bottlefeeding 4) Depression screening 5) S/P postplacental Liletta IUD placement 8/24 6) Dep/anx> currently doing well on  celexa 20mg  and prn vistaril, will let us know if she feels meds need to be adjusted 7) Delayed c/s healing> looking good today, ~1cm open area Rt side of incision. Keep clean/dry, keep f/u on 10/12 as scheduled  Meds: No orders of the defined types were placed in this encounter.   Follow-up: Return for As scheduled 10/12 for incision check.   No orders of the defined types were placed in this encounter.   [place], [facility] 01/22/2019 1:00 PM

## 2019-01-29 ENCOUNTER — Other Ambulatory Visit: Payer: Self-pay | Admitting: Obstetrics and Gynecology

## 2019-01-29 ENCOUNTER — Telehealth: Payer: Self-pay | Admitting: *Deleted

## 2019-01-29 NOTE — Telephone Encounter (Signed)
Patient has breast feeding questions.

## 2019-01-29 NOTE — Telephone Encounter (Addendum)
Pt is trying to breastfeed. She is pumping and is only getting 1 oz total from both breasts. Pt is taking Fenugreek at least BID, 3 tabs. Pt is drinking plenty of fluids and eating well balanced meals as best as she can. Pt is also eating lactation cookies. Baby is still in NICU. Baby was born at 63 weeks. Has also tried body armor drinks. Pt states her pharmacist mentioned something could be called in to help. Please advise. Thanks!! Janice Taylor

## 2019-01-30 NOTE — Telephone Encounter (Signed)
Pt aware of Kim's recommendations and voiced understanding. JSY 

## 2019-01-31 NOTE — Telephone Encounter (Signed)
Patient is having incisonal retraction as it heals , and that leads to tugging and pulling, She has a deep crease at the site of the incision which is deep in the pannus crease.  Drainage has stopped.  Pt still travelling to [facility] NICU for daily visits as baby is still in NICU. Will fill tramadol once more. Incision eval next week.

## 2019-02-05 ENCOUNTER — Ambulatory Visit (INDEPENDENT_AMBULATORY_CARE_PROVIDER_SITE_OTHER): Payer: Medicaid Other | Admitting: Obstetrics & Gynecology

## 2019-02-05 ENCOUNTER — Other Ambulatory Visit: Payer: Self-pay

## 2019-02-05 ENCOUNTER — Encounter: Payer: Self-pay | Admitting: Obstetrics & Gynecology

## 2019-02-05 ENCOUNTER — Encounter: Payer: Self-pay | Admitting: *Deleted

## 2019-02-05 VITALS — BP 144/88 | HR 97 | Ht 64.0 in | Wt 367.0 lb

## 2019-02-05 DIAGNOSIS — Z98891 History of uterine scar from previous surgery: Secondary | ICD-10-CM

## 2019-02-05 MED ORDER — ESCITALOPRAM OXALATE 20 MG PO TABS: 20.0000 mg | ORAL_TABLET | Freq: Every day | ORAL | 3 refills | Status: DC

## 2019-02-05 NOTE — Progress Notes (Signed)
  HPI: Patient returns for routine postoperative follow-up having undergone repeat C section 12/18/2018 on .  The patient's immediate postoperative recovery has been unremarkable. Since hospital discharge the patient reports no puckering.   Current Outpatient Medications: citalopram (CELEXA) 20 MG tablet, Take 10mg  (1/2 tablet) daily x 1 week, then 20mg  (1 tablet) daily thereafter, Disp: 30 tablet, Rfl: 2 cyclobenzaprine (FLEXERIL) 10 MG tablet, Take 1 tablet (10 mg total) by mouth 3 (three) times daily as needed for muscle spasms., Disp: 30 tablet, Rfl: 3 hydrOXYzine (ATARAX/VISTARIL) 10 MG tablet, Take 1 tablet (10 mg total) by mouth 3 (three) times daily as needed for anxiety., Disp: 30 tablet, Rfl: 2 Prenatal Vit-Fe Fumarate-FA (MULTIVITAMIN-PRENATAL) 27-0.8 MG TABS tablet, Take 1 tablet by mouth daily at 12 noon., Disp: , Rfl:  traMADol (ULTRAM) 50 MG tablet, TAKE 2 TABLETS BY MOUTH EVERY 6 HOURS ASNEEDED FOR MODERATE PAIN OR SEVERE PAIN., Disp: 30 tablet, Rfl: 0 zolpidem (AMBIEN) 10 MG tablet, Take 0.5 tablets (5 mg total) by mouth at bedtime as needed for sleep. (Patient taking differently: Take 5 mg by mouth at bedtime. ), Disp: 30 tablet, Rfl: 1  No current facility-administered medications for this visit.     Blood pressure (!) 144/88, pulse 97, height 5\' 4"  (1.626 m), weight (!) 367 lb (166.5 kg), currently breastfeeding.  Physical Exam: Incision clean dry some right lateral puckering but no infection and healing well  Diagnostic Tests:   Pathology:   Impression: S/p repeat  C section  Plan:   Follow up: prn    Florian Buff, MD

## 2019-02-13 ENCOUNTER — Telehealth: Payer: Self-pay | Admitting: *Deleted

## 2019-02-13 ENCOUNTER — Other Ambulatory Visit: Payer: Self-pay | Admitting: Women's Health

## 2019-02-13 NOTE — Telephone Encounter (Signed)
Pt requesting for Tish to call her about her short term disability.

## 2019-02-14 ENCOUNTER — Telehealth: Payer: Self-pay | Admitting: *Deleted

## 2019-02-14 NOTE — Telephone Encounter (Signed)
Returned patient's call. Unable to leave VM  

## 2019-02-14 NOTE — Telephone Encounter (Signed)
Patient states [employer] is needing more information from our office in regards to Dr Elonda Husky keeping her out of work un 03/11/19.  Postpartum notes faxed to [employer] per pt request.

## 2019-02-23 ENCOUNTER — Other Ambulatory Visit: Payer: Self-pay

## 2019-02-23 ENCOUNTER — Other Ambulatory Visit: Payer: Self-pay | Admitting: Obstetrics and Gynecology

## 2019-03-12 ENCOUNTER — Ambulatory Visit (INDEPENDENT_AMBULATORY_CARE_PROVIDER_SITE_OTHER): Payer: Medicaid Other | Admitting: Obstetrics & Gynecology

## 2019-03-12 ENCOUNTER — Encounter: Payer: Self-pay | Admitting: Obstetrics & Gynecology

## 2019-03-12 ENCOUNTER — Telehealth: Payer: Self-pay | Admitting: *Deleted

## 2019-03-12 ENCOUNTER — Other Ambulatory Visit: Payer: Self-pay

## 2019-03-12 VITALS — BP 168/95 | HR 94 | Ht 64.0 in | Wt 372.4 lb

## 2019-03-12 DIAGNOSIS — Z98891 History of uterine scar from previous surgery: Secondary | ICD-10-CM | POA: Diagnosis not present

## 2019-03-12 MED ORDER — CITALOPRAM HYDROBROMIDE 20 MG PO TABS: 20.0000 mg | ORAL_TABLET | Freq: Every day | ORAL | 2 refills | Status: DC

## 2019-03-12 MED ORDER — ZOLPIDEM TARTRATE 10 MG PO TABS: 10.0000 mg | ORAL_TABLET | Freq: Every evening | ORAL | 2 refills | Status: DC | PRN

## 2019-03-12 MED ORDER — CYCLOBENZAPRINE HCL 10 MG PO TABS: 10.0000 mg | ORAL_TABLET | Freq: Three times a day (TID) | ORAL | 1 refills | Status: DC | PRN

## 2019-03-12 NOTE — Addendum Note (Signed)
Addended by: Florian Buff on: 03/12/2019 12:50 PM   Modules accepted: Orders

## 2019-03-12 NOTE — Telephone Encounter (Signed)
Pt left message that she needs short term disability paperwork sent in.

## 2019-03-12 NOTE — Progress Notes (Signed)
  HPI: Patient returns for routine postoperative follow-up having undergone repeat C section  on 11/2018.  The patient's immediate postoperative recovery has been unremarkable. Since hospital discharge the patient reports firm area left lateral corner, dimple area.   Current Outpatient Medications: citalopram (CELEXA) 20 MG tablet, TAKE 1/2 TABLET DAILY FOR 1 WEEK THEN 1 TABLET DAILY THEREAFTER., Disp: 30 tablet, Rfl: 11 cyclobenzaprine (FLEXERIL) 10 MG tablet, Take 1 tablet (10 mg total) by mouth 3 (three) times daily as needed for muscle spasms., Disp: 30 tablet, Rfl: 3 hydrOXYzine (ATARAX/VISTARIL) 10 MG tablet, Take 1 tablet (10 mg total) by mouth 3 (three) times daily as needed for anxiety., Disp: 30 tablet, Rfl: 2 traMADol (ULTRAM) 50 MG tablet, TAKE 2 TABLETS BY MOUTH EVERY 6 HOURS AS NEEDED FOR MODERATE OR SEVERE PAIN., Disp: 30 tablet, Rfl: 0 zolpidem (AMBIEN) 10 MG tablet, Take 0.5 tablets (5 mg total) by mouth at bedtime as needed for sleep. (Patient taking differently: Take 5 mg by mouth at bedtime. ), Disp: 30 tablet, Rfl: 1 escitalopram (LEXAPRO) 20 MG tablet, Take 1 tablet (20 mg total) by mouth daily. (Patient not taking: Reported on 03/12/2019), Disp: 30 tablet, Rfl: 3 Prenatal Vit-Fe Fumarate-FA (MULTIVITAMIN-PRENATAL) 27-0.8 MG TABS tablet, Take 1 tablet by mouth daily at 12 noon., Disp: , Rfl:   No current facility-administered medications for this visit.     Blood pressure (!) 168/95, pulse 94, height 5\' 4"  (1.626 m), weight (!) 372 lb 6.4 oz (168.9 kg), not currently breastfeeding.  Physical Exam: Deep retraction to the right of center of the incision but well healed not infected Left lateral corner with  Suture know palpated  Diagnostic Tests:   Pathology:   Impression: S/p repeat C section with some moisture changes and superficial healing issues  Plan: Well healed  Keep dry  Follow up: prn    Florian Buff, MD

## 2019-03-13 ENCOUNTER — Encounter: Payer: Self-pay | Admitting: *Deleted

## 2019-03-16 ENCOUNTER — Emergency Department (HOSPITAL_COMMUNITY): Payer: Medicaid Other

## 2019-03-16 ENCOUNTER — Emergency Department (HOSPITAL_COMMUNITY)
Admission: EM | Admit: 2019-03-16 | Discharge: 2019-03-16 | Disposition: A | Discharge status: Home / Self Care | Payer: Medicaid Other | Attending: Emergency Medicine | Admitting: Emergency Medicine

## 2019-03-16 ENCOUNTER — Ambulatory Visit: Payer: Medicaid Other | Admitting: Obstetrics & Gynecology

## 2019-03-16 ENCOUNTER — Other Ambulatory Visit: Payer: Self-pay

## 2019-03-16 DIAGNOSIS — Z79899 Other long term (current) drug therapy: Secondary | ICD-10-CM | POA: Diagnosis not present

## 2019-03-16 DIAGNOSIS — R05 Cough: Secondary | ICD-10-CM | POA: Diagnosis present

## 2019-03-16 DIAGNOSIS — Z20828 Contact with and (suspected) exposure to other viral communicable diseases: Secondary | ICD-10-CM | POA: Diagnosis not present

## 2019-03-16 DIAGNOSIS — Z975 Presence of (intrauterine) contraceptive device: Secondary | ICD-10-CM | POA: Diagnosis not present

## 2019-03-16 DIAGNOSIS — B349 Viral infection, unspecified: Secondary | ICD-10-CM | POA: Insufficient documentation

## 2019-03-16 MED ORDER — BENZONATATE 200 MG PO CAPS: 200.0000 mg | ORAL_CAPSULE | Freq: Three times a day (TID) | ORAL | 0 refills | Status: DC | PRN

## 2019-03-16 NOTE — Discharge Instructions (Addendum)
You have been tested for Covid.  You may follow your results on MyChart.  It is important that you and your family members wear a mask.  You will need to isolate at home for at least 7 days without symptoms including fever without taking fever reducers such as Tylenol or ibuprofen.  Return to the ER if you develop worsening shortness of breath

## 2019-03-16 NOTE — ED Triage Notes (Signed)
Pt reports both parents have tested positive for covid, Mom positive 2 days ago and dad 3 weeks ago, pt started having SOB, fatigue, chills, and cough today. No fever in triage. Pt did not take temp at home.

## 2019-03-18 LAB — NOVEL CORONAVIRUS, NAA (HOSP ORDER, SEND-OUT TO REF LAB; TAT 18-24 HRS): SARS-CoV-2, NAA: NOT DETECTED

## 2019-03-18 NOTE — ED Provider Notes (Signed)
[facility] EMERGENCY DEPARTMENT Provider Note   CSN: [id number] Arrival date & time: 03/16/19  1859     History   Chief Complaint No chief complaint on file.   HPI Janice Taylor is a 28 y.o. female.     HPI   Janice Taylor is a 28 y.o. female who presents to the Emergency Department requesting a COVID test.  She complains of cough, body aches, fatigue and shortness of breath associated with cough.  She states that both her parents have recently tested positive for COVID.  She denies known fever, diarrhea, vomiting, loss of taste or smell.      Past Medical History:  Diagnosis Date  . ADHD (attention deficit hyperactivity disorder)   . Anxiety   . Clotting disorder ([place])   . Cough   . Depression   . Genital HSV   . 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 Leg swelling   . Obesity 01/05/2013  . Thrombocytopenia ([place]) 11/25/2011   Consistent with ITP    Patient Active Problem List   Diagnosis Date Noted  . Encounter for IUD insertion 01/22/2019  . Previous cesarean section 01/22/2019  . Cesarean section wound complication [id number]  . History of prior pregnancy with IUGR newborn 12/08/2018  . Anxiety 11/07/2018  . History of gestational hypertension 11/06/2018  . History of ITP 11/25/2011    Past Surgical History:  Procedure Laterality Date  . CESAREAN SECTION N/A 12/18/2018   Procedure: CESAREAN SECTION;  Surgeon: Woodroe Mode, MD;  Location: [facility] LD ORS;  Service: Obstetrics;  Laterality: N/A;  . TONSILECTOMY, ADENOIDECTOMY, BILATERAL MYRINGOTOMY AND TUBES       OB History    Gravida  3   Para  2   Term  1   Preterm  1   AB  1   Living  2     SAB  1   TAB      Ectopic      Multiple  0   Live Births  2            Home Medications    Prior to Admission medications   Medication Sig Start Date End Date Taking? Authorizing Provider  benzonatate (TESSALON) 200 MG capsule Take 1 capsule (200 mg  total) by mouth 3 (three) times daily as needed for cough. Swallow whole, do not chew 03/16/19   Triplett, Tammy, PA-C  citalopram (CELEXA) 20 MG tablet TAKE 1/2 TABLET DAILY FOR 1 WEEK THEN 1 TABLET DAILY THEREAFTER. 02/13/19   Roma Schanz, CNM  citalopram (CELEXA) 20 MG tablet Take 1 tablet (20 mg total) by mouth daily. 03/12/19   Florian Buff, MD  cyclobenzaprine (FLEXERIL) 10 MG tablet Take 1 tablet (10 mg total) by mouth 3 (three) times daily as needed for muscle spasms. 12/21/18   Constant, Peggy, MD  cyclobenzaprine (FLEXERIL) 10 MG tablet Take 1 tablet (10 mg total) by mouth every 8 (eight) hours as needed for muscle spasms. 03/12/19   Florian Buff, MD  escitalopram (LEXAPRO) 20 MG tablet Take 1 tablet (20 mg total) by mouth daily. Patient not taking: Reported on 03/12/2019 02/05/19   Florian Buff, MD  hydrOXYzine (ATARAX/VISTARIL) 10 MG tablet Take 1 tablet (10 mg total) by mouth 3 (three) times daily as needed for anxiety. 12/04/18   Anyanwu, Sallyanne Havers, MD  Prenatal Vit-Fe Fumarate-FA (MULTIVITAMIN-PRENATAL) 27-0.8 MG TABS tablet Take 1 tablet by mouth daily at 12 noon.  [provider]  traMADol (ULTRAM) 50 MG tablet TAKE 2 TABLETS BY MOUTH EVERY 6 HOURS AS NEEDED FOR MODERATE OR SEVERE PAIN. 02/27/19   Jonnie Kind, MD  zolpidem (AMBIEN) 10 MG tablet Take 0.5 tablets (5 mg total) by mouth at bedtime as needed for sleep. Patient taking differently: Take 5 mg by mouth at bedtime.  01/18/19   Jonnie Kind, MD  zolpidem (AMBIEN) 10 MG tablet Take 1 tablet (10 mg total) by mouth at bedtime as needed for sleep. 03/12/19   Florian Buff, MD    Family History Family History  Problem Relation Age of Onset  . Diabetes Maternal Uncle   . Kidney disease Maternal Uncle        renal failure/dialysis  . Heart attack Maternal Grandmother     Social History Social History   Tobacco Use  . Smoking status: Never Smoker  . Smokeless tobacco: Never Used  Substance Use  Topics  . Alcohol use: No  . Drug use: No     Allergies   Codeine and Ondansetron hcl   Review of Systems Review of Systems  Constitutional: Positive for fatigue. Negative for appetite change, chills and fever.  HENT: Positive for congestion. Negative for sore throat and trouble swallowing.   Respiratory: Positive for cough, chest tightness and shortness of breath. Negative for wheezing.   Cardiovascular: Negative for chest pain.  Gastrointestinal: Negative for abdominal pain, nausea and vomiting.  Genitourinary: Negative for dysuria.  Musculoskeletal: Positive for myalgias. Negative for arthralgias.  Skin: Negative for rash.  Neurological: Negative for dizziness, weakness and numbness.  Hematological: Negative for adenopathy.     Physical Exam Updated Vital Signs BP (!) 152/73 (BP Location: Right Arm)   Pulse 93   Temp 98.8 F (37.1 C) (Oral)   Resp 17   Ht 5\' 4"  (1.626 m)   Wt (!) 158.8 kg   SpO2 99%   BMI 60.08 kg/m   Physical Exam Vitals signs and nursing note reviewed.  Constitutional:      General: She is not in acute distress.    Appearance: She is well-developed. She is not ill-appearing.  HENT:     Head: Normocephalic.     Jaw: No trismus.     Nose: Mucosal edema present. No rhinorrhea.     Mouth/Throat:     Mouth: Mucous membranes are moist.     Pharynx: Uvula midline. No oropharyngeal exudate, posterior oropharyngeal erythema or uvula swelling.     Tonsils: No tonsillar abscesses.  Neck:     Musculoskeletal: Normal range of motion.     Trachea: Phonation normal.     Meningeal: Brudzinski's sign and Kernig's sign absent.  Cardiovascular:     Rate and Rhythm: Normal rate and regular rhythm.  Pulmonary:     Effort: Pulmonary effort is normal. No respiratory distress.     Breath sounds: Normal breath sounds. No wheezing, rhonchi or rales.  Abdominal:     General: There is no distension.     Palpations: Abdomen is soft.     Tenderness: There is no  abdominal tenderness. There is no guarding or rebound.  Musculoskeletal:     Right lower leg: No edema.     Left lower leg: No edema.  Lymphadenopathy:     Cervical: No cervical adenopathy.  Skin:    General: Skin is warm.     Capillary Refill: Capillary refill takes less than 2 seconds.  Neurological:     Mental Status: She  is alert. Mental status is at baseline.     Sensory: No sensory deficit.     Motor: No weakness or abnormal muscle tone.      ED Treatments / Results  Labs (all labs ordered are listed, but only abnormal results are displayed) Labs Reviewed  NOVEL CORONAVIRUS, NAA (HOSP ORDER, SEND-OUT TO REF LAB; TAT 18-24 HRS)    EKG None  Radiology Dg Chest Port 1 View  Result Date: 03/16/2019 CLINICAL DATA:  Shortness of breath EXAM: PORTABLE CHEST 1 VIEW COMPARISON:  09/13/2015 FINDINGS: The heart size and mediastinal contours are within normal limits. Both lungs are clear. The visualized skeletal structures are unremarkable. IMPRESSION: No active disease. Electronically Signed   By: Donavan Foil M.D.   On: 03/16/2019 22:22    Procedures Procedures (including critical care time)  Medications Ordered in ED Medications - No data to display   Initial Impression / Assessment and Plan / ED Course  I have reviewed the triage vital signs and the nursing notes.  Pertinent labs & imaging results that were available during my care of the patient were reviewed by me and considered in my medical decision making (see chart for details).        Pt with high clinical suspicion for COVID.  Test pending.  Vitals reviewed, no tachycardia, tachypnea or hypoxia.  Afebrile here.  Pt appears stable for d/c, agrees to isolate at home until results are back.  Return precautions discussed   Final Clinical Impressions(s) / ED Diagnoses   Final diagnoses:  Viral illness    ED Discharge Orders         Ordered    benzonatate (TESSALON) 200 MG capsule  3 times daily PRN      03/16/19 2235           Kem Parkinson, PA-C 03/18/19 1441    Daleen Bo, MD 03/19/19 [phone]

## 2019-04-25 ENCOUNTER — Other Ambulatory Visit: Payer: Self-pay

## 2019-05-31 ENCOUNTER — Other Ambulatory Visit: Payer: Self-pay | Admitting: Obstetrics & Gynecology

## 2019-06-25 HISTORY — PX: DIAGNOSTIC LAPAROSCOPY: SUR761

## 2019-06-28 ENCOUNTER — Other Ambulatory Visit: Payer: Self-pay | Admitting: Obstetrics & Gynecology

## 2019-07-21 ENCOUNTER — Other Ambulatory Visit: Payer: Self-pay | Admitting: Obstetrics & Gynecology

## 2019-08-20 ENCOUNTER — Other Ambulatory Visit: Payer: Self-pay | Admitting: Obstetrics & Gynecology

## 2019-08-22 ENCOUNTER — Telehealth: Payer: Self-pay | Admitting: Advanced Practice Midwife

## 2019-08-22 NOTE — Telephone Encounter (Signed)
Patient called, stated that she is having gastric bypass surgery at the end of June.  She is stating that they are requesting that she have an updated p/p before her surgery and to make sure that the bc she is on is strong enough to keep her from getting pregnant within that first year after surgery because it's dangerous. I couldn't tell by previous appointments if she has had a recent p/p.  [phone]

## 2019-08-22 NOTE — Telephone Encounter (Signed)
Pt is having gastric bypass the end of June. Has not had a physical & pap. Also, pt wanted to make sure her birth control was strong enough to keep her from getting pregnant that first year. Pt states she has the [place] IUD. Manus Rudd., CNM advised it's effective, but as with any birth control, it's not 100%. Pt voiced understanding and call was transferred to [facility] to schedule appt. [place]

## 2019-09-14 ENCOUNTER — Other Ambulatory Visit: Payer: Self-pay | Admitting: Obstetrics & Gynecology

## 2019-09-20 ENCOUNTER — Other Ambulatory Visit (HOSPITAL_COMMUNITY)
Admission: RE | Admit: 2019-09-20 | Discharge: 2019-09-20 | Disposition: A | Discharge status: Home / Self Care | Payer: Medicaid Other | Source: Ambulatory Visit | Attending: Advanced Practice Midwife | Admitting: Advanced Practice Midwife

## 2019-09-20 ENCOUNTER — Telehealth: Payer: Self-pay | Admitting: Advanced Practice Midwife

## 2019-09-20 ENCOUNTER — Encounter: Payer: Self-pay | Admitting: Advanced Practice Midwife

## 2019-09-20 ENCOUNTER — Ambulatory Visit: Payer: Medicaid Other | Admitting: Advanced Practice Midwife

## 2019-09-20 ENCOUNTER — Other Ambulatory Visit (HOSPITAL_COMMUNITY): Payer: Self-pay | Admitting: Advanced Practice Midwife

## 2019-09-20 VITALS — BP 133/87 | HR 100 | Ht 63.0 in | Wt 370.2 lb

## 2019-09-20 DIAGNOSIS — Z975 Presence of (intrauterine) contraceptive device: Secondary | ICD-10-CM

## 2019-09-20 DIAGNOSIS — L918 Other hypertrophic disorders of the skin: Secondary | ICD-10-CM | POA: Diagnosis not present

## 2019-09-20 DIAGNOSIS — Z01419 Encounter for gynecological examination (general) (routine) without abnormal findings: Secondary | ICD-10-CM | POA: Diagnosis present

## 2019-09-20 MED ORDER — CITRANATAL HARMONY 27-1-260 MG PO CAPS: ORAL_CAPSULE | ORAL | 11 refills | Status: DC

## 2019-09-20 MED ORDER — HYDROXYZINE PAMOATE 25 MG PO CAPS
25.0000 mg | ORAL_CAPSULE | Freq: Three times a day (TID) | ORAL | 0 refills | Status: DC | PRN
Start: 2019-09-20 — End: 2020-03-31

## 2019-09-20 NOTE — Telephone Encounter (Signed)
Pt states her pharmacy didn't receive xanax

## 2019-09-20 NOTE — Progress Notes (Signed)
Janice Taylor 29 y.o.  Vitals:   09/20/19 1150  BP: 133/87  Pulse: 100     Filed Weights   09/20/19 1150  Weight: (!) 370 lb 3.2 oz (167.9 kg)    Past Medical History: Past Medical History:  Diagnosis Date  . ADHD (attention deficit hyperactivity disorder)   . Anxiety   . Clotting disorder ([place])   . Cough   . Depression   . Genital HSV   . 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 Leg swelling   . Obesity 01/05/2013  . Thrombocytopenia ([place]) 11/25/2011   Consistent with ITP    Past Surgical History: Past Surgical History:  Procedure Laterality Date  . CESAREAN SECTION N/A 12/18/2018   Procedure: CESAREAN SECTION;  Surgeon: Woodroe Mode, MD;  Location: [facility] LD ORS;  Service: Obstetrics;  Laterality: N/A;  . TONSILECTOMY, ADENOIDECTOMY, BILATERAL MYRINGOTOMY AND TUBES      Family History: Family History  Problem Relation Age of Onset  . Diabetes Maternal Uncle   . Kidney disease Maternal Uncle        renal failure/dialysis  . Heart attack Maternal Grandmother     Social History: Social History   Tobacco Use  . Smoking status: Never Smoker  . Smokeless tobacco: Never Used  Substance Use Topics  . Alcohol use: No  . Drug use: No    Allergies:  Allergies  Allergen Reactions  . Codeine Nausea And Vomiting  . Ondansetron Hcl Other (See Comments)    Not effective`      Current Outpatient Medications:  .  ALPRAZolam (XANAX) 0.5 MG tablet, Take by mouth., Disp: , Rfl:  .  cyclobenzaprine (FLEXERIL) 10 MG tablet, Take 10 mg by mouth 3 (three) times daily as needed for muscle spasms., Disp: , Rfl:  .  ergocalciferol (VITAMIN D2) 1.25 MG (50000 UT) capsule, Take by mouth., Disp: , Rfl:  .  phentermine 37.5 MG capsule, Take by mouth., Disp: , Rfl:  .  Prenat-FeFmCb-DSS-FA-DHA w/o A (CITRANATAL HARMONY) 27-1-260 MG CAPS, 1 PO daily, Disp: 30 capsule, Rfl: 11 .  topiramate (TOPAMAX) 50 MG tablet, Take 1 tablet po daily, Disp: ,  Rfl:  .  zolpidem (AMBIEN) 10 MG tablet, Take 1 tablet (10 mg total) by mouth at bedtime as needed for sleep., Disp: 15 tablet, Rfl: 2 .  hydrOXYzine (VISTARIL) 25 MG capsule, Take 1 capsule (25 mg total) by mouth 3 (three) times daily as needed., Disp: 30 capsule, Rfl: 0  History of Present Illness: Here for pap and physical. Getting gastric bypass in a month, MD wants it (last one 2 years ago, normal).  Also needs to confirm the IUD is still in ([place] IUD postplacentally 9 months ago).  Has irritating skin tag that grew during pregnancy on inner right thigh, wants it removed.  Has anxiety at times, used to take xanax (rx'd by PCP).  Discussed not appropriate for OB to rx for long term use, will try vistaril instead.     Review of Systems   Patient denies any headaches, blurred vision, shortness of breath, chest pain, abdominal pain, problems with bowel movements, urination, or intercourse.   Physical Exam: General:  Well developed, well nourished, morbidly obese; no acute distress Skin:  Warm and dry Lungs; Clear to auscultation bilaterally Breast:  No dominant palpable mass, retraction, or nipple discharge Cardiovascular: Regular rate and rhythm Abdomen:  Soft, non tender Pelvic:  External genitalia is normal in appearance.  The vagina is normal in appearance.  The cervix is bulbous.  Uterus is felt to be normal size, shape, and contour.  No adnexal masses or tenderness noted. Exam limited by habitus. IUD strings palpable Extremities:  No swelling or varicosities noted.  1/2cm skin tag on inner right thigh:  Base injected w/2cc 1% lidocaine, tag removed w/#11blade, 2 stitches w/2-0 silk.  Marland Kitchen Psych:  Mild anxiety.     Impression: Normal GYN exam Morbid obesity IUD in place Skin tag removal Anxiety  Plan: if pap normal, repeat in 3 years Pt to remove stitches herself in a week or come see me for removal

## 2019-09-27 ENCOUNTER — Ambulatory Visit (INDEPENDENT_AMBULATORY_CARE_PROVIDER_SITE_OTHER): Payer: Medicaid Other | Admitting: *Deleted

## 2019-09-27 DIAGNOSIS — Z4802 Encounter for removal of sutures: Secondary | ICD-10-CM

## 2019-09-27 LAB — CYTOLOGY - PAP
Adequacy: ABSENT
Chlamydia: NEGATIVE
Comment: NEGATIVE
Comment: NEGATIVE
Comment: NORMAL
Diagnosis: NEGATIVE
High risk HPV: NEGATIVE
Neisseria Gonorrhea: NEGATIVE

## 2019-09-27 NOTE — Progress Notes (Signed)
Pt in for suture removal. Removed two stitches from inner right thigh. Incision clean and dry. Neosporin applied. Patient has no questions or concerns.

## 2019-10-26 ENCOUNTER — Other Ambulatory Visit: Payer: Self-pay | Admitting: Obstetrics & Gynecology

## 2020-01-02 ENCOUNTER — Telehealth: Payer: Self-pay | Admitting: Adult Health

## 2020-01-02 NOTE — Telephone Encounter (Signed)
Telephoned patient at home number. Patient states had one positive pregnancy test and having clear stringy discharge. Patient is concerned. Advised patient would need to schedule appointment for discharge and we can do pregnancy test at visit. Patient voiced understanding.

## 2020-01-02 NOTE — Telephone Encounter (Signed)
Patient states that she is having snotlike discharge and would like a nurse to follow up regarding her symptoms.

## 2020-01-21 ENCOUNTER — Other Ambulatory Visit: Payer: Self-pay | Admitting: Obstetrics & Gynecology

## 2020-03-07 ENCOUNTER — Other Ambulatory Visit: Payer: Self-pay | Admitting: Internal Medicine

## 2020-03-07 DIAGNOSIS — R945 Abnormal results of liver function studies: Secondary | ICD-10-CM

## 2020-03-12 ENCOUNTER — Other Ambulatory Visit: Payer: Self-pay

## 2020-03-12 ENCOUNTER — Ambulatory Visit (HOSPITAL_COMMUNITY)
Admission: RE | Admit: 2020-03-12 | Discharge: 2020-03-12 | Disposition: A | Discharge status: Home / Self Care | Payer: Medicaid Other | Source: Ambulatory Visit | Attending: Internal Medicine | Admitting: Internal Medicine

## 2020-03-12 DIAGNOSIS — R945 Abnormal results of liver function studies: Secondary | ICD-10-CM

## 2020-03-31 ENCOUNTER — Encounter: Payer: Self-pay | Admitting: Adult Health

## 2020-03-31 ENCOUNTER — Ambulatory Visit (INDEPENDENT_AMBULATORY_CARE_PROVIDER_SITE_OTHER): Payer: Medicaid Other | Admitting: Adult Health

## 2020-03-31 ENCOUNTER — Other Ambulatory Visit: Payer: Self-pay

## 2020-03-31 ENCOUNTER — Other Ambulatory Visit: Payer: Self-pay | Admitting: Obstetrics & Gynecology

## 2020-03-31 VITALS — BP 154/92 | HR 92 | Ht 64.0 in | Wt 351.0 lb

## 2020-03-31 DIAGNOSIS — L02224 Furuncle of groin: Secondary | ICD-10-CM | POA: Diagnosis not present

## 2020-03-31 DIAGNOSIS — K649 Unspecified hemorrhoids: Secondary | ICD-10-CM | POA: Diagnosis not present

## 2020-03-31 DIAGNOSIS — I10 Essential (primary) hypertension: Secondary | ICD-10-CM | POA: Diagnosis not present

## 2020-03-31 MED ORDER — SULFAMETHOXAZOLE-TRIMETHOPRIM 800-160 MG PO TABS: 1.0000 | ORAL_TABLET | Freq: Two times a day (BID) | ORAL | 0 refills | Status: DC

## 2020-03-31 MED ORDER — HYDROCORTISONE ACE-PRAMOXINE 1-1 % EX CREA: 1.0000 "application " | TOPICAL_CREAM | Freq: Two times a day (BID) | CUTANEOUS | 0 refills | Status: DC

## 2020-03-31 MED ORDER — SILVER SULFADIAZINE 1 % EX CREA: 1.0000 "application " | TOPICAL_CREAM | Freq: Every day | CUTANEOUS | 0 refills | Status: DC

## 2020-03-31 NOTE — Patient Instructions (Addendum)
DASH Eating Plan DASH stands for "Dietary Approaches to Stop Hypertension." The DASH eating plan is a healthy eating plan that has been shown to reduce high blood pressure (hypertension). It may also reduce your risk for type 2 diabetes, heart disease, and stroke. The DASH eating plan may also help with weight loss. What are tips for following this plan?  General guidelines  Avoid eating more than 2,300 mg (milligrams) of salt (sodium) a day. If you have hypertension, you may need to reduce your sodium intake to 1,500 mg a day.  Limit alcohol intake to no more than 1 drink a day for nonpregnant women and 2 drinks a day for men. One drink equals 12 oz of beer, 5 oz of wine, or 1 oz of hard liquor.  Work with your health care provider to maintain a healthy body weight or to lose weight. Ask what an ideal weight is for you.  Get at least 30 minutes of exercise that causes your heart to beat faster (aerobic exercise) most days of the week. Activities may include walking, swimming, or biking.  Work with your health care provider or diet and nutrition specialist (dietitian) to adjust your eating plan to your individual calorie needs. Reading food labels   Check food labels for the amount of sodium per serving. Choose foods with less than 5 percent of the Daily Value of sodium. Generally, foods with less than 300 mg of sodium per serving fit into this eating plan.  To find whole grains, look for the word "whole" as the first word in the ingredient list. Shopping  Buy products labeled as "low-sodium" or "no salt added."  Buy fresh foods. Avoid canned foods and premade or frozen meals. Cooking  Avoid adding salt when cooking. Use salt-free seasonings or herbs instead of table salt or sea salt. Check with your health care provider or pharmacist before using salt substitutes.  Do not fry foods. Cook foods using healthy methods such as baking, boiling, grilling, and broiling instead.  Cook with  heart-healthy oils, such as olive, canola, soybean, or sunflower oil. Meal planning  Eat a balanced diet that includes: ? 5 or more servings of fruits and vegetables each day. At each meal, try to fill half of your plate with fruits and vegetables. ? Up to 6-8 servings of whole grains each day. ? Less than 6 oz of lean meat, poultry, or fish each day. A 3-oz serving of meat is about the same size as a deck of cards. One egg equals 1 oz. ? 2 servings of low-fat dairy each day. ? A serving of nuts, seeds, or beans 5 times each week. ? Heart-healthy fats. Healthy fats called Omega-3 fatty acids are found in foods such as flaxseeds and coldwater fish, like sardines, salmon, and mackerel.  Limit how much you eat of the following: ? Canned or prepackaged foods. ? Food that is high in trans fat, such as fried foods. ? Food that is high in saturated fat, such as fatty meat. ? Sweets, desserts, sugary drinks, and other foods with added sugar. ? Full-fat dairy products.  Do not salt foods before eating.  Try to eat at least 2 vegetarian meals each week.  Eat more home-cooked food and less restaurant, buffet, and fast food.  When eating at a restaurant, ask that your food be prepared with less salt or no salt, if possible. What foods are recommended? The items listed may not be a complete list. Talk with your dietitian about   what dietary choices are best for you. Grains Whole-grain or whole-wheat bread. Whole-grain or whole-wheat pasta. Brown rice. Oatmeal. Quinoa. Bulgur. Whole-grain and low-sodium cereals. Pita bread. Low-fat, low-sodium crackers. Whole-wheat flour tortillas. Vegetables Fresh or frozen vegetables (raw, steamed, roasted, or grilled). Low-sodium or reduced-sodium tomato and vegetable juice. Low-sodium or reduced-sodium tomato sauce and tomato paste. Low-sodium or reduced-sodium canned vegetables. Fruits All fresh, dried, or frozen fruit. Canned fruit in natural juice (without  added sugar). Meat and other protein foods Skinless chicken or turkey. Ground chicken or turkey. Pork with fat trimmed off. Fish and seafood. Egg whites. Dried beans, peas, or lentils. Unsalted nuts, nut butters, and seeds. Unsalted canned beans. Lean cuts of beef with fat trimmed off. Low-sodium, lean deli meat. Dairy Low-fat (1%) or fat-free (skim) milk. Fat-free, low-fat, or reduced-fat cheeses. Nonfat, low-sodium ricotta or cottage cheese. Low-fat or nonfat yogurt. Low-fat, low-sodium cheese. Fats and oils Soft margarine without trans fats. Vegetable oil. Low-fat, reduced-fat, or light mayonnaise and salad dressings (reduced-sodium). Canola, safflower, olive, soybean, and sunflower oils. Avocado. Seasoning and other foods Herbs. Spices. Seasoning mixes without salt. Unsalted popcorn and pretzels. Fat-free sweets. What foods are not recommended? The items listed may not be a complete list. Talk with your dietitian about what dietary choices are best for you. Grains Baked goods made with fat, such as croissants, muffins, or some breads. Dry pasta or rice meal packs. Vegetables Creamed or fried vegetables. Vegetables in a cheese sauce. Regular canned vegetables (not low-sodium or reduced-sodium). Regular canned tomato sauce and paste (not low-sodium or reduced-sodium). Regular tomato and vegetable juice (not low-sodium or reduced-sodium). Pickles. Olives. Fruits Canned fruit in a light or heavy syrup. Fried fruit. Fruit in cream or butter sauce. Meat and other protein foods Fatty cuts of meat. Ribs. Fried meat. Bacon. Sausage. Bologna and other processed lunch meats. Salami. Fatback. Hotdogs. Bratwurst. Salted nuts and seeds. Canned beans with added salt. Canned or smoked fish. Whole eggs or egg yolks. Chicken or turkey with skin. Dairy Whole or 2% milk, cream, and half-and-half. Whole or full-fat cream cheese. Whole-fat or sweetened yogurt. Full-fat cheese. Nondairy creamers. Whipped toppings.  Processed cheese and cheese spreads. Fats and oils Butter. Stick margarine. Lard. Shortening. Ghee. Bacon fat. Tropical oils, such as coconut, palm kernel, or palm oil. Seasoning and other foods Salted popcorn and pretzels. Onion salt, garlic salt, seasoned salt, table salt, and sea salt. Worcestershire sauce. Tartar sauce. Barbecue sauce. Teriyaki sauce. Soy sauce, including reduced-sodium. Steak sauce. Canned and packaged gravies. Fish sauce. Oyster sauce. Cocktail sauce. Horseradish that you find on the shelf. Ketchup. Mustard. Meat flavorings and tenderizers. Bouillon cubes. Hot sauce and Tabasco sauce. Premade or packaged marinades. Premade or packaged taco seasonings. Relishes. Regular salad dressings. Where to find more information:  National Heart, Lung, and Blood Institute: www.nhlbi.nih.gov  American Heart Association: www.heart.org Summary  The DASH eating plan is a healthy eating plan that has been shown to reduce high blood pressure (hypertension). It may also reduce your risk for type 2 diabetes, heart disease, and stroke.  With the DASH eating plan, you should limit salt (sodium) intake to 2,300 mg a day. If you have hypertension, you may need to reduce your sodium intake to 1,500 mg a day.  When on the DASH eating plan, aim to eat more fresh fruits and vegetables, whole grains, lean proteins, low-fat dairy, and heart-healthy fats.  Work with your health care provider or diet and nutrition specialist (dietitian) to adjust your eating plan to your   individual calorie needs. This information is not intended to replace advice given to you by your health care provider. Make sure you discuss any questions you have with your health care provider. Document Revised: 03/25/2017 Document Reviewed: 04/05/2016 Elsevier Patient Education  2020 [employer]. Hemorrhoids Hemorrhoids are swollen veins in and around the rectum or anus. There are two types of hemorrhoids:  Internal hemorrhoids.  These occur in the veins that are just inside the rectum. They may poke through to the outside and become irritated and painful.  External hemorrhoids. These occur in the veins that are outside the anus and can be felt as a painful swelling or hard lump near the anus. Most hemorrhoids do not cause serious problems, and they can be managed with home treatments such as diet and lifestyle changes. If home treatments do not help the symptoms, procedures can be done to shrink or remove the hemorrhoids. What are the causes? This condition is caused by increased pressure in the anal area. This pressure may result from various things, including:  Constipation.  Straining to have a bowel movement.  Diarrhea.  Pregnancy.  Obesity.  Sitting for long periods of time.  Heavy lifting or other activity that causes you to strain.  Anal sex.  Riding a bike for a long period of time. What are the signs or symptoms? Symptoms of this condition include:  Pain.  Anal itching or irritation.  Rectal bleeding.  Leakage of stool (feces).  Anal swelling.  One or more lumps around the anus. How is this diagnosed? This condition can often be diagnosed through a visual exam. Other exams or tests may also be done, such as:  An exam that involves feeling the rectal area with a gloved hand (digital rectal exam).  An exam of the anal canal that is done using a small tube (anoscope).  A blood test, if you have lost a significant amount of blood.  A test to look inside the colon using a flexible tube with a camera on the end (sigmoidoscopy or colonoscopy). How is this treated? This condition can usually be treated at home. However, various procedures may be done if dietary changes, lifestyle changes, and other home treatments do not help your symptoms. These procedures can help make the hemorrhoids smaller or remove them completely. Some of these procedures involve surgery, and others do not. Common  procedures include:  Rubber band ligation. Rubber bands are placed at the base of the hemorrhoids to cut off their blood supply.  Sclerotherapy. Medicine is injected into the hemorrhoids to shrink them.  Infrared coagulation. A type of light energy is used to get rid of the hemorrhoids.  Hemorrhoidectomy surgery. The hemorrhoids are surgically removed, and the veins that supply them are tied off.  Stapled hemorrhoidopexy surgery. The surgeon staples the base of the hemorrhoid to the rectal wall. Follow these instructions at home: Eating and drinking   Eat foods that have a lot of fiber in them, such as whole grains, beans, nuts, fruits, and vegetables.  Ask your health care provider about taking products that have added fiber (fiber supplements).  Reduce the amount of fat in your diet. You can do this by eating low-fat dairy products, eating less red meat, and avoiding processed foods.  Drink enough fluid to keep your urine pale yellow. Managing pain and swelling   Take warm sitz baths for 20 minutes, 3-4 times a day to ease pain and discomfort. You may do this in a bathtub or using  a portable sitz bath that fits over the toilet.  If directed, apply ice to the affected area. Using ice packs between sitz baths may be helpful. ? Put ice in a plastic bag. ? Place a towel between your skin and the bag. ? Leave the ice on for 20 minutes, 2-3 times a day. General instructions  Take over-the-counter and prescription medicines only as told by your health care provider.  Use medicated creams or suppositories as told.  Get regular exercise. Ask your health care provider how much and what kind of exercise is best for you. In general, you should do moderate exercise for at least 30 minutes on most days of the week (150 minutes each week). This can include activities such as walking, biking, or yoga.  Go to the bathroom when you have the urge to have a bowel movement. Do not wait.  Avoid  straining to have bowel movements.  Keep the anal area dry and clean. Use wet toilet paper or moist towelettes after a bowel movement.  Do not sit on the toilet for long periods of time. This increases blood pooling and pain.  Keep all follow-up visits as told by your health care provider. This is important. Contact a health care provider if you have:  Increasing pain and swelling that are not controlled by treatment or medicine.  Difficulty having a bowel movement, or you are unable to have a bowel movement.  Pain or inflammation outside the area of the hemorrhoids. Get help right away if you have:  Uncontrolled bleeding from your rectum. Summary  Hemorrhoids are swollen veins in and around the rectum or anus.  Most hemorrhoids can be managed with home treatments such as diet and lifestyle changes.  Taking warm sitz baths can help ease pain and discomfort.  In severe cases, procedures or surgery can be done to shrink or remove the hemorrhoids. This information is not intended to replace advice given to you by your health care provider. Make sure you discuss any questions you have with your health care provider. Document Revised: 09/08/2018 Document Reviewed: 09/01/2017 Elsevier Patient Education  [place]. Skin Abscess  A skin abscess is an infected area on or under your skin that contains a collection of pus and other material. An abscess may also be called a furuncle, carbuncle, or boil. An abscess can occur in or on almost any part of your body. Some abscesses break open (rupture) on their own. Most continue to get worse unless they are treated. The infection can spread deeper into the body and eventually into your blood, which can make you feel ill. Treatment usually involves draining the abscess. What are the causes? An abscess occurs when germs, like bacteria, pass through your skin and cause an infection. This may be caused by:  A scrape or cut on your skin.  A  puncture wound through your skin, including a needle injection or insect bite.  Blocked oil or sweat glands.  Blocked and infected hair follicles.  A cyst that forms beneath your skin (sebaceous cyst) and becomes infected. What increases the risk? This condition is more likely to develop in people who:  Have a weak body defense system (immune system).  Have diabetes.  Have dry and irritated skin.  Get frequent injections or use illegal IV drugs.  Have a foreign body in a wound, such as a splinter.  Have problems with their lymph system or veins. What are the signs or symptoms? Symptoms of this condition  include:  A painful, firm bump under the skin.  A bump with pus at the top. This may break through the skin and drain. Other symptoms include:  Redness surrounding the abscess site.  Warmth.  Swelling of the lymph nodes (glands) near the abscess.  Tenderness.  A sore on the skin. How is this diagnosed? This condition may be diagnosed based on:  A physical exam.  Your medical history.  A sample of pus. This may be used to find out what is causing the infection.  Blood tests.  Imaging tests, such as an ultrasound, CT scan, or MRI. How is this treated? A small abscess that drains on its own may not need treatment. Treatment for larger abscesses may include:  Moist heat or heat pack applied to the area several times a day.  A procedure to drain the abscess (incision and drainage).  Antibiotic medicines. For a severe abscess, you may first get antibiotics through an IV and then change to antibiotics by mouth. Follow these instructions at home: Medicines   Take over-the-counter and prescription medicines only as told by your health care provider.  If you were prescribed an antibiotic medicine, take it as told by your health care provider. Do not stop taking the antibiotic even if you start to feel better. Abscess care   If you have an abscess that has not  drained, apply heat to the affected area. Use the heat source that your health care provider recommends, such as a moist heat pack or a heating pad. ? Place a towel between your skin and the heat source. ? Leave the heat on for 20-30 minutes. ? Remove the heat if your skin turns bright red. This is especially important if you are unable to feel pain, heat, or cold. You may have a greater risk of getting burned.  Follow instructions from your health care provider about how to take care of your abscess. Make sure you: ? Cover the abscess with a bandage (dressing). ? Change your dressing or gauze as told by your health care provider. ? Wash your hands with soap and water before you change the dressing or gauze. If soap and water are not available, use hand sanitizer.  Check your abscess every day for signs of a worsening infection. Check for: ? More redness, swelling, or pain. ? More fluid or blood. ? Warmth. ? More pus or a bad smell. General instructions  To avoid spreading the infection: ? Do not share personal care items, towels, or hot tubs with others. ? Avoid making skin contact with other people.  Keep all follow-up visits as told by your health care provider. This is important. Contact a health care provider if you have:  More redness, swelling, or pain around your abscess.  More fluid or blood coming from your abscess.  Warm skin around your abscess.  More pus or a bad smell coming from your abscess.  A fever.  Muscle aches.  Chills or a general ill feeling. Get help right away if you:  Have severe pain.  See red streaks on your skin spreading away from the abscess. Summary  A skin abscess is an infected area on or under your skin that contains a collection of pus and other material.  A small abscess that drains on its own may not need treatment.  Treatment for larger abscesses may include having a procedure to drain the abscess and taking an antibiotic. This  information is not intended to replace advice given  to you by your health care provider. Make sure you discuss any questions you have with your health care provider. Document Revised: 08/03/2018 Document Reviewed: 05/26/2017 Elsevier Patient Education  2020 [employer].

## 2020-03-31 NOTE — Progress Notes (Signed)
  Subjective:     Patient ID: Janice Taylor, female   DOB: 1990/05/22, 29 y.o.   MRN: [id number]  HPI Shaneca is a 29 year old white female,singel, [id number], in complaining of rectal pain, like hemorrhoid and has seen some blood and has a boil. She has appt at [facility] for gastric by pass consult. PCP is Dr Hilma Favors.  Review of Systems Rectal pain ?hemorrhoid Boil Reviewed past medical,surgical, social and family history. Reviewed medications and allergies.     Objective:   Physical Exam BP (!) 154/92 (BP Location: Right Arm, Patient Position: Sitting, Cuff Size: Large)   Pulse 92   Ht 5\' 4"  (1.626 m)   Wt (!) 351 lb (159.2 kg)   Breastfeeding No   BMI 60.25 kg/m    Skin is warm and dry, has 2 cm red boil right top of thigh at groin,  On rectal exam has small firm internal hemorrhoid, no bleeding today PHQ 2 score is 0   Upstream - 03/31/20 1207      Pregnancy Intention Screening   Does the patient want to become pregnant in the next year? No    Does the patient's partner want to become pregnant in the next year? No    Would the patient like to discuss contraceptive options today? No      Contraception Wrap Up   Current Method IUD or IUS    End Method IUD or IUS    Contraception Counseling Provided No         Examination chaperoned by Tish RN Assessment:     1. Hemorrhoids, unspecified hemorrhoid type Will rx analpram HC  Meds ordered this encounter  Medications  . sulfamethoxazole-trimethoprim (BACTRIM DS) 800-160 MG tablet    Sig: Take 1 tablet by mouth 2 (two) times daily. Take 1 bid    Dispense:  28 tablet    Refill:  0    Order Specific Question:   Supervising Provider    Answer:   EURE, LUTHER H [[id number]]  . pramoxine-hydrocortisone (PROCTOCREAM-HC) 1-1 % rectal cream    Sig: Place 1 application rectally 2 (two) times daily.    Dispense:  30 g    Refill:  0    Order Specific Question:   Supervising Provider    Answer:   Elonda Husky, LUTHER H [[id number]]  . silver  sulfADIAZINE (SILVADENE) 1 % cream    Sig: Apply 1 application topically daily.    Dispense:  50 g    Refill:  0    Order Specific Question:   Supervising Provider    Answer:   Elonda Husky, LUTHER H [[id number]]    2. Boil of groin Use warm compresses Will rx septra ds and silvadene Do not squeeze  3. Hypertension, unspecified type,is on meds  Follow up with PCP on BP     Plan:     Review handouts on skin abscess, hemorrhoids and DASH diet

## 2020-04-13 ENCOUNTER — Emergency Department (HOSPITAL_COMMUNITY)
Admission: EM | Admit: 2020-04-13 | Discharge: 2020-04-13 | Disposition: A | Discharge status: Home / Self Care | Payer: Medicaid Other | Attending: Emergency Medicine | Admitting: Emergency Medicine

## 2020-04-13 ENCOUNTER — Emergency Department (HOSPITAL_COMMUNITY): Payer: Medicaid Other

## 2020-04-13 ENCOUNTER — Other Ambulatory Visit: Payer: Self-pay

## 2020-04-13 ENCOUNTER — Encounter (HOSPITAL_COMMUNITY): Payer: Self-pay | Admitting: Emergency Medicine

## 2020-04-13 DIAGNOSIS — R0602 Shortness of breath: Secondary | ICD-10-CM | POA: Diagnosis not present

## 2020-04-13 DIAGNOSIS — R519 Headache, unspecified: Secondary | ICD-10-CM | POA: Insufficient documentation

## 2020-04-13 DIAGNOSIS — F419 Anxiety disorder, unspecified: Secondary | ICD-10-CM | POA: Diagnosis not present

## 2020-04-13 DIAGNOSIS — I10 Essential (primary) hypertension: Secondary | ICD-10-CM | POA: Diagnosis not present

## 2020-04-13 DIAGNOSIS — Z79899 Other long term (current) drug therapy: Secondary | ICD-10-CM | POA: Insufficient documentation

## 2020-04-13 DIAGNOSIS — R0789 Other chest pain: Secondary | ICD-10-CM | POA: Diagnosis not present

## 2020-04-13 LAB — CBC
HCT: 39.2 % (ref 36.0–46.0)
Hemoglobin: 12.7 g/dL (ref 12.0–15.0)
MCH: 27.9 pg (ref 26.0–34.0)
MCHC: 32.4 g/dL (ref 30.0–36.0)
MCV: 86.2 fL (ref 80.0–100.0)
Platelets: 216 10*3/uL (ref 150–400)
RBC: 4.55 MIL/uL (ref 3.87–5.11)
RDW: 12.5 % (ref 11.5–15.5)
WBC: 9.9 10*3/uL (ref 4.0–10.5)
nRBC: 0 % (ref 0.0–0.2)

## 2020-04-13 LAB — BASIC METABOLIC PANEL
Anion gap: 12 (ref 5–15)
BUN: 12 mg/dL (ref 6–20)
CO2: 23 mmol/L (ref 22–32)
Calcium: 9 mg/dL (ref 8.9–10.3)
Chloride: 100 mmol/L (ref 98–111)
Creatinine, Ser: 0.72 mg/dL (ref 0.44–1.00)
GFR, Estimated: >60 mL/min (ref >60)
Glucose, Bld: 126 mg/dL — ABNORMAL HIGH (ref 70–99)
Potassium: 3.5 mmol/L (ref 3.5–5.1)
Sodium: 135 mmol/L (ref 135–145)

## 2020-04-13 LAB — D-DIMER, QUANTITATIVE: D-Dimer, Quant: 0.54 ug/mL-FEU — ABNORMAL HIGH (ref 0.00–0.50)

## 2020-04-13 LAB — TROPONIN I (HIGH SENSITIVITY)
Troponin I (High Sensitivity): 3 ng/L (ref <18)
Troponin I (High Sensitivity): 4 ng/L (ref <18)

## 2020-04-13 MED ORDER — OXYCODONE HCL 5 MG PO TABS
5.0000 mg | ORAL_TABLET | Freq: Once | ORAL | Status: AC
Start: 2020-04-13 — End: 2020-04-13
  Administered 2020-04-13: 5 mg via ORAL
  Filled 2020-04-13: qty 1

## 2020-04-13 MED ORDER — BUTALBITAL-APAP-CAFFEINE 50-325-40 MG PO TABS: 1.0000 | ORAL_TABLET | Freq: Four times a day (QID) | ORAL | 0 refills | Status: AC | PRN

## 2020-04-13 MED ORDER — BUTALBITAL-APAP-CAFFEINE 50-325-40 MG PO TABS: 1.0000 | ORAL_TABLET | ORAL | 0 refills | Status: DC | PRN

## 2020-04-13 MED ORDER — IOHEXOL 350 MG/ML SOLN
80.0000 mL | Freq: Once | INTRAVENOUS | Status: AC | PRN
  Administered 2020-04-13: 80 mL via INTRAVENOUS

## 2020-04-13 MED ORDER — ACETAMINOPHEN 325 MG PO TABS
650.0000 mg | ORAL_TABLET | Freq: Once | ORAL | Status: AC
  Administered 2020-04-13: 650 mg via ORAL
  Filled 2020-04-13: qty 2

## 2020-04-13 NOTE — ED Notes (Signed)
Pt reports pain to her lab stick sites   Small bruising noted where multiple sticks for lab Pt request tegaderm for sites, then wrapped pressure dressings- no site is more than 3/4 inch in total  She is given a warm blanket for warmth to sites   She also reports no pain relief from percocet

## 2020-04-13 NOTE — ED Notes (Signed)
Pt report no chest pain   Request pain meds for headache

## 2020-04-13 NOTE — ED Notes (Signed)
Pt reports recent dx of HTN  She is morbidly obese  Muffled HS, Diminished LS   Unsuccessful IV attempt  Apolonio Schneiders, RN, CN in to attempt

## 2020-04-13 NOTE — ED Notes (Signed)
Mother to beside as pt reports "anxious"

## 2020-04-13 NOTE — ED Triage Notes (Signed)
Pt c/o chest pain with left arm pain that began yesterday.

## 2020-04-13 NOTE — ED Notes (Signed)
From Rad 

## 2020-04-13 NOTE — ED Provider Notes (Signed)
[facility] EMERGENCY DEPARTMENT Provider Note   CSN: [id number] Arrival date & time: 04/13/20  1049     History Chief Complaint  Patient presents with  . Chest Pain    Janice Taylor is a 29 y.o. female.  HPI      Janice Taylor is a 29 y.o. female past medical history of anxiety, hypertension, and history of ITP who presents to the Emergency Department complaining of upper left chest pain and upper left arm pain that began yesterday.  She describes a dull intermittent pain to her left chest that radiates to the level of her deltoid.  Pain was persistent this morning which prompted her ER visit.  Some shortness of breath and reports having a headache since onset of chest pain.  No thunder clap headache, vomiting or visual changes. She denies cough, fever, recent illness, known injury and hx of heart disease or previous blood clots.  She does have IUD.  Non smoker.  Past Medical History:  Diagnosis Date  . ADHD (attention deficit hyperactivity disorder)   . Anxiety   . Clotting disorder ([place])   . Cough   . Depression   . Genital HSV   . Hypertension   . 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 Leg swelling   . Obesity 01/05/2013  . Thrombocytopenia ([place]) 11/25/2011   Consistent with ITP    Patient Active Problem List   Diagnosis Date Noted  . Boil of groin 03/31/2020  . Hemorrhoids 03/31/2020  . Hypertension 03/31/2020  . Encounter for IUD insertion 01/22/2019  . Anxiety 11/07/2018  . History of ITP 11/25/2011    Past Surgical History:  Procedure Laterality Date  . CESAREAN SECTION N/A 12/18/2018   Procedure: CESAREAN SECTION;  Surgeon: Woodroe Mode, MD;  Location: [facility] LD ORS;  Service: Obstetrics;  Laterality: N/A;  . TONSILECTOMY, ADENOIDECTOMY, BILATERAL MYRINGOTOMY AND TUBES       OB History    Gravida  3   Para  2   Term  1   Preterm  1   AB  1   Living  2     SAB  1   IAB      Ectopic      Multiple  0    Live Births  2           Family History  Problem Relation Age of Onset  . Diabetes Maternal Uncle   . Kidney disease Maternal Uncle        renal failure/dialysis  . Heart attack Maternal Grandmother     Social History   Tobacco Use  . Smoking status: Never Smoker  . Smokeless tobacco: Never Used  Vaping Use  . Vaping Use: Never used  Substance Use Topics  . Alcohol use: No  . Drug use: No    Home Medications Prior to Admission medications   Medication Sig Start Date End Date Taking? Authorizing Provider  amLODipine (NORVASC) 10 MG tablet  02/14/20   [provider]  butalbital-acetaminophen-caffeine (FIORICET) 50-325-40 MG tablet SMARTSIG:1 Tablet(s) By Mouth 4-5 Times Daily Patient not taking: Reported on 03/31/2020 01/25/20   [provider]  cyclobenzaprine (FLEXERIL) 10 MG tablet TAKE 1 TABLET EVERY 8 HOURS AS NEEDED FOR MUSCLE SPASMS. 03/31/20   Florian Buff, MD  hydrochlorothiazide (HYDRODIURIL) 25 MG tablet Take 25 mg by mouth daily. 01/10/20   [provider]  labetalol (NORMODYNE) 100 MG tablet Take by mouth. Patient not taking:  Reported on 03/31/2020 02/18/20   [provider]  pramoxine-hydrocortisone (PROCTOCREAM-HC) 1-1 % rectal cream Place 1 application rectally 2 (two) times daily. 03/31/20   Estill Dooms, NP  PROAIR HFA 108 [id number] Base) MCG/ACT inhaler Inhale into the lungs. Patient not taking: Reported on 03/31/2020 01/10/20   [provider]  silver sulfADIAZINE (SILVADENE) 1 % cream Apply 1 application topically daily. 03/31/20   Estill Dooms, NP  sulfamethoxazole-trimethoprim (BACTRIM DS) 800-160 MG tablet Take 1 tablet by mouth 2 (two) times daily. Take 1 bid 03/31/20   Estill Dooms, NP  zolpidem (AMBIEN) 10 MG tablet Take 1 tablet (10 mg total) by mouth at bedtime as needed for sleep. 03/12/19   Florian Buff, MD    Allergies    Codeine and Ondansetron hcl  Review of Systems   Review of  Systems  Constitutional: Negative for chills, fatigue and fever.  HENT: Negative for congestion, sore throat and trouble swallowing.   Eyes: Negative for visual disturbance.  Respiratory: Positive for shortness of breath. Negative for cough and wheezing.   Cardiovascular: Positive for chest pain. Negative for palpitations.  Gastrointestinal: Negative for abdominal pain, nausea and vomiting.  Genitourinary: Negative for dysuria and flank pain.  Musculoskeletal: Negative for arthralgias, myalgias, neck pain and neck stiffness.  Skin: Negative for rash.  Neurological: Positive for headaches. Negative for dizziness, speech difficulty, weakness and numbness.  Hematological: Does not bruise/bleed easily.  Psychiatric/Behavioral: The patient is nervous/anxious.     Physical Exam Updated Vital Signs BP (!) 165/112   Pulse (!) 106   Temp 99.1 F (37.3 C) (Oral)   Resp 12   Ht 5\' 4"  (1.626 m)   Wt (!) 158.8 kg   SpO2 100%   BMI 60.09 kg/m   Physical Exam Vitals and nursing note reviewed.  Constitutional:      Appearance: Normal appearance. She is obese. She is not toxic-appearing or diaphoretic.  HENT:     Head: Normocephalic.  Eyes:     Conjunctiva/sclera: Conjunctivae normal.     Pupils: Pupils are equal, round, and reactive to light.  Neck:     Thyroid: No thyromegaly.     Meningeal: Kernig's sign absent.  Cardiovascular:     Rate and Rhythm: Regular rhythm. Tachycardia present.     Pulses: Normal pulses.  Pulmonary:     Effort: Pulmonary effort is normal. No respiratory distress.     Breath sounds: Normal breath sounds. No wheezing.  Chest:     Chest wall: No tenderness.  Abdominal:     Palpations: Abdomen is soft.     Tenderness: There is no abdominal tenderness.  Musculoskeletal:        General: Normal range of motion.     Cervical back: Normal range of motion and neck supple.     Right lower leg: No edema.     Left lower leg: No edema.  Skin:    General: Skin is  warm.     Findings: No rash.  Neurological:     General: No focal deficit present.     Mental Status: She is alert.     Sensory: Sensation is intact. No sensory deficit.     Motor: Motor function is intact. No weakness.     ED Results / Procedures / Treatments   Labs (all labs ordered are listed, but only abnormal results are displayed) Labs Reviewed  BASIC METABOLIC PANEL - Abnormal; Notable for the following components:  Result Value   Glucose, Bld 126 (*)    All other components within normal limits  D-DIMER, QUANTITATIVE (NOT AT [facility]) - Abnormal; Notable for the following components:   D-Dimer, Quant 0.54 (*)    All other components within normal limits  CBC  TROPONIN I (HIGH SENSITIVITY)  TROPONIN I (HIGH SENSITIVITY)    EKG EKG Interpretation  Date/Time:  Sunday April 13 2020 10:59:54 EST Ventricular Rate:  118 PR Interval:    QRS Duration: 99 QT Interval:  277 QTC Calculation: 388 R Axis:   91 Text Interpretation: Sinus tachycardia Rightward axis Nonspecific T wave abnormality Confirmed by Lajean Saver [phone]) on 04/13/2020 11:11:01 AM   Radiology CT Angio Chest PE W and/or Wo Contrast  Result Date: 04/13/2020 CLINICAL DATA:  Chest pain yesterday which has since resolved. EXAM: CT ANGIOGRAPHY CHEST WITH CONTRAST TECHNIQUE: Multidetector CT imaging of the chest was performed using the standard protocol during bolus administration of intravenous contrast. Multiplanar CT image reconstructions and MIPs were obtained to evaluate the vascular anatomy. CONTRAST:  80 mL OMNIPAQUE IOHEXOL 350 MG/ML SOLN COMPARISON:  Single-view of the chest today. FINDINGS: Cardiovascular: Satisfactory opacification of the pulmonary arteries to the segmental level. No evidence of pulmonary embolism. Normal heart size. No pericardial effusion. Mediastinum/Nodes: No enlarged mediastinal, hilar, or axillary lymph nodes. Thyroid gland, trachea, and esophagus demonstrate no significant  findings. Lungs/Pleura: Lungs are clear. No pleural effusion or pneumothorax. Upper Abdomen: Fatty infiltration of the liver noted. Musculoskeletal: Negative. Review of the MIP images confirms the above findings. IMPRESSION: Negative for pulmonary embolus or acute disease. Fatty infiltration of the liver. Electronically Signed   By: Inge Rise M.D.   On: 04/13/2020 14:58   DG Chest Portable 1 View  Result Date: 04/13/2020 CLINICAL DATA:  Chest and left arm pain since yesterday. EXAM: PORTABLE CHEST 1 VIEW COMPARISON:  PA and lateral chest 11/03/2019 and 09/13/2015. FINDINGS: The lungs are clear. Heart size is normal. No pneumothorax or pleural fluid. No acute or focal bony abnormality. IMPRESSION: No acute disease. Electronically Signed   By: Inge Rise M.D.   On: 04/13/2020 12:28    Procedures Procedures (including critical care time)  Medications Ordered in ED Medications - No data to display  ED Course  I have reviewed the triage vital signs and the nursing notes.  Pertinent labs & imaging results that were available during my care of the patient were reviewed by me and considered in my medical decision making (see chart for details).    MDM Rules/Calculators/A&P                          Pt here with left chest pain, onset on day prior to arrival, worse this morning.  On exam, she is anxious appearing.  Tachycardic.  No hx of PE/DVT, but has risk factors.  EKG w/o acute ischemic changes, trop reassuring , no leukocytosis or electrolyte abnml.  D dimer slighlty elevated.  Given risk factors along with CP and tachycardia, I feel need to proceed with CT angio of chest.     On recheck, pt now resting comfortably.  BP has improved still mildly tachycardic but overall improved.  CT angio chest is neg for PE.  Pt admits to increased anxiety recently due to home and job stressors and feels as though her anxolytic is no longer working.  I feel that she is appropriate for d/c home.   Doubt emergent process.  I have advised  a brief increase of her lorazepam dose from 0.5 mg BID to 1 mg BID and also advised close PCP f/u next week.  Pt also given out pt resource guide for mental health at her request.     Final Clinical Impression(s) / ED Diagnoses Final diagnoses:  Atypical chest pain  Anxiety    Rx / DC Orders ED Discharge Orders    None       Kem Parkinson, PA-C 04/14/20 1338    Lajean Saver, MD 04/14/20 1642

## 2020-04-13 NOTE — Discharge Instructions (Addendum)
Your work-up today was reassuring.  Your symptoms may be related to anxiety.  As discussed, increase your Ativan to 1 mg twice daily as needed until you can see your primary care provider.  I have listed a outpatient resource guide for you if you would like to see a therapist regarding your anxiety.

## 2020-04-13 NOTE — ED Notes (Signed)
To Rad 

## 2020-04-13 NOTE — ED Notes (Signed)
Apolonio Schneiders.RN, CN continues at try to get labs thru [place]  Difficulty due to pt obesity

## 2020-04-30 ENCOUNTER — Other Ambulatory Visit: Payer: Self-pay | Admitting: Obstetrics & Gynecology

## 2020-05-04 ENCOUNTER — Other Ambulatory Visit: Payer: Self-pay

## 2020-05-04 ENCOUNTER — Ambulatory Visit
Admission: EM | Admit: 2020-05-04 | Discharge: 2020-05-04 | Disposition: A | Discharge status: Home / Self Care | Payer: Medicaid Other | Attending: Family Medicine | Admitting: Family Medicine

## 2020-05-04 DIAGNOSIS — Z9189 Other specified personal risk factors, not elsewhere classified: Secondary | ICD-10-CM

## 2020-05-04 NOTE — ED Triage Notes (Signed)
Nurse visit only.  Needs covid test

## 2020-05-08 LAB — NOVEL CORONAVIRUS, NAA: SARS-CoV-2, NAA: NOT DETECTED

## 2020-05-15 ENCOUNTER — Other Ambulatory Visit: Payer: Self-pay | Admitting: Obstetrics & Gynecology

## 2020-05-18 ENCOUNTER — Other Ambulatory Visit: Payer: Self-pay

## 2020-05-18 ENCOUNTER — Encounter: Payer: Self-pay | Admitting: Emergency Medicine

## 2020-05-18 ENCOUNTER — Ambulatory Visit
Admission: EM | Admit: 2020-05-18 | Discharge: 2020-05-18 | Disposition: A | Discharge status: Home / Self Care | Payer: Medicaid Other | Attending: Family Medicine | Admitting: Family Medicine

## 2020-05-18 DIAGNOSIS — Z20822 Contact with and (suspected) exposure to covid-19: Secondary | ICD-10-CM

## 2020-05-18 DIAGNOSIS — J069 Acute upper respiratory infection, unspecified: Secondary | ICD-10-CM

## 2020-05-18 NOTE — ED Provider Notes (Signed)
[facility]   [id number] 05/18/20 Arrival Time: 7829   CC: COVID symptoms  SUBJECTIVE: History from: patient.  Janice Taylor is a 30 y.o. female who presents with positive Covid exposure at home from her daughter, sore throat, fatigue, nasal congestion for the last 3 days.  Denies recent travel. Has negative history of Covid. Has not completed Covid vaccines. Has not taken OTC medications for this. There are no aggravating or alleviating factors.  Reports that she tested herself and her child this morning.  Reports that she cannot remember which test was which, but that one of them came back positive the other was negative.  Denies previous symptoms in the past. Denies sinus pain, rhinorrhea, SOB, wheezing, chest pain, nausea, changes in bowel or bladder habits.    ROS: As per HPI.  All other pertinent ROS negative.     Past Medical History:  Diagnosis Date  . ADHD (attention deficit hyperactivity disorder)   . Anxiety   . Clotting disorder ([place])   . Cough   . Depression   . Genital HSV   . Hypertension   . 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 Leg swelling   . Obesity 01/05/2013  . Thrombocytopenia ([place]) 11/25/2011   Consistent with ITP   Past Surgical History:  Procedure Laterality Date  . CESAREAN SECTION N/A 12/18/2018   Procedure: CESAREAN SECTION;  Surgeon: Woodroe Mode, MD;  Location: [facility] LD ORS;  Service: Obstetrics;  Laterality: N/A;  . TONSILECTOMY, ADENOIDECTOMY, BILATERAL MYRINGOTOMY AND TUBES     Allergies  Allergen Reactions  . Codeine Nausea And Vomiting  . Ondansetron Hcl Other (See Comments)    Not effective`   No current facility-administered medications on file prior to encounter.   Current Outpatient Medications on File Prior to Encounter  Medication Sig Dispense Refill  . amLODipine (NORVASC) 10 MG tablet Take 10 mg by mouth daily.    . butalbital-acetaminophen-caffeine (FIORICET) 50-325-40 MG tablet  Take 1-2 tablets by mouth every 6 (six) hours as needed for headache. 20 tablet 0  . cyclobenzaprine (FLEXERIL) 10 MG tablet TAKE 1 TABLET EVERY 8 HOURS AS NEEDED FOR MUSCLE SPASMS. 30 tablet 0  . hydrochlorothiazide (HYDRODIURIL) 25 MG tablet Take 50 mg by mouth daily.    Marland Kitchen LORazepam (ATIVAN) 0.5 MG tablet Take 0.5 mg by mouth every 8 (eight) hours as needed for anxiety.    . pramoxine-hydrocortisone (PROCTOCREAM-HC) 1-1 % rectal cream Place 1 application rectally 2 (two) times daily. 30 g 0  . silver sulfADIAZINE (SILVADENE) 1 % cream Apply 1 application topically daily. 50 g 0  . zolpidem (AMBIEN) 10 MG tablet Take 1 tablet (10 mg total) by mouth at bedtime as needed for sleep. 15 tablet 2   Social History   Socioeconomic History  . Marital status: Single    Spouse name: Not on file  . Number of children: 2  . Years of education: Not on file  . Highest education level: Not on file  Occupational History  . Occupation: Chartered certified accountant    Comment: [employer]  Tobacco Use  . Smoking status: Never Smoker  . Smokeless tobacco: Never Used  Vaping Use  . Vaping Use: Never used  Substance and Sexual Activity  . Alcohol use: No  . Drug use: No  . Sexual activity: Not Currently    Birth control/protection: I.U.D.  Other Topics Concern  . Not on file  Social History Narrative  . Not  on file   Social Determinants of Health   Financial Resource Strain: Low Risk   . Difficulty of Paying Living Expenses: Not very hard  Food Insecurity: No Food Insecurity  . Worried About Charity fundraiser in the Last Year: Never true  . Ran Out of Food in the Last Year: Never true  Transportation Needs: No Transportation Needs  . Lack of Transportation (Medical): No  . Lack of Transportation (Non-Medical): No  Physical Activity: Sufficiently Active  . Days of Exercise per Week: 4 days  . Minutes of Exercise per Session: 40 min  Stress: Stress Concern Present  . Feeling of Stress : Very much   Social Connections: Moderately Integrated  . Frequency of Communication with Friends and Family: More than three times a week  . Frequency of Social Gatherings with Friends and Family: Twice a week  . Attends Religious Services: More than 4 times per year  . Active Member of Clubs or Organizations: No  . Attends Archivist Meetings: Never  . Marital Status: Living with partner  Intimate Partner Violence: Not At Risk  . Fear of Current or Ex-Partner: No  . Emotionally Abused: No  . Physically Abused: No  . Sexually Abused: No   Family History  Problem Relation Age of Onset  . Diabetes Maternal Uncle   . Kidney disease Maternal Uncle        renal failure/dialysis  . Heart attack Maternal Grandmother     OBJECTIVE:  Vitals:   05/18/20 1544  BP: 136/87  Pulse: (!) 108  Resp: 18  Temp: 98.7 F (37.1 C)  TempSrc: Oral  SpO2: 96%     General appearance: alert; appears fatigued, but nontoxic; speaking in full sentences and tolerating own secretions HEENT: NCAT; Ears: EACs clear, TMs pearly gray; Eyes: PERRL.  EOM grossly intact. Sinuses: nontender; Nose: nares patent with clear rhinorrhea, Throat: oropharynx erythematous, cobblestoning present, tonsils non erythematous or enlarged, uvula midline  Neck: supple without LAD Lungs: unlabored respirations, symmetrical air entry; cough: mild; no respiratory distress; CTAB Heart: regular rate and rhythm.  Radial pulses 2+ symmetrical bilaterally Skin: warm and dry Psychological: alert and cooperative; normal mood and affect  LABS:  No results found for this or any previous visit (from the past 24 hour(s)).   ASSESSMENT & PLAN:  1. Exposure to COVID-19 virus   2. Viral URI     Continue supportive care at home COVID and flu testing ordered.  It will take between 2-3 days for test results. Someone will contact you regarding abnormal results.   Work note provided Patient should remain in quarantine until they have  received Covid results.  If negative you may resume normal activities (go back to work/school) while practicing hand hygiene, social distance, and mask wearing.  If positive, patient should remain in quarantine for at least 5 days from symptom onset AND greater than 72 hours after symptoms resolution (absence of fever without the use of fever-reducing medication and improvement in respiratory symptoms), whichever is longer Get plenty of rest and push fluids Use OTC zyrtec for nasal congestion, runny nose, and/or sore throat Use OTC flonase for nasal congestion and runny nose Use medications daily for symptom relief Use OTC medications like ibuprofen or tylenol as needed fever or pain Call or go to the ED if you have any new or worsening symptoms such as fever, worsening cough, shortness of breath, chest tightness, chest pain, turning blue, changes in mental status.  Reviewed expectations re:  course of current medical issues. Questions answered. Outlined signs and symptoms indicating need for more acute intervention. Patient verbalized understanding. After Visit Summary given.         Faustino Congress, NP 05/19/20 1754

## 2020-05-18 NOTE — Discharge Instructions (Signed)
Your COVID test is pending.  You should self quarantine until the test result is back.    Take Tylenol or ibuprofen as needed for fever or discomfort.  Rest and keep yourself hydrated.    Follow-up with your primary care provider if your symptoms are not improving.     

## 2020-05-18 NOTE — ED Triage Notes (Signed)
Sore throat, fatigue, nasal congestion x 3 days

## 2020-05-20 LAB — COVID-19, FLU A+B NAA
Influenza A, NAA: NOT DETECTED
Influenza B, NAA: NOT DETECTED
SARS-CoV-2, NAA: NOT DETECTED

## 2020-07-16 ENCOUNTER — Other Ambulatory Visit: Payer: Self-pay | Admitting: Obstetrics & Gynecology

## 2020-07-25 ENCOUNTER — Telehealth: Payer: Self-pay | Admitting: *Deleted

## 2020-07-25 ENCOUNTER — Telehealth: Payer: Self-pay | Admitting: Obstetrics & Gynecology

## 2020-07-25 NOTE — Telephone Encounter (Signed)
Patient called stating that she would like to speak with a nurse, Patient did not state the reason but it was personal. Please contact pt

## 2020-07-25 NOTE — Telephone Encounter (Signed)
Spoke with patient, she stated that her bladder has been leaking since she had her baby. Requesting to be seen. Scheduled her with Dr. Nelda Marseille. No other questions at this time.

## 2020-08-01 ENCOUNTER — Other Ambulatory Visit (HOSPITAL_COMMUNITY): Payer: Self-pay

## 2020-08-01 MED ORDER — ZOLPIDEM TARTRATE 10 MG PO TABS
10.0000 mg | ORAL_TABLET | Freq: Every evening | ORAL | 0 refills | Status: DC
  Filled 2020-08-01: qty 75, 75d supply, fill #0

## 2020-08-04 ENCOUNTER — Other Ambulatory Visit (HOSPITAL_COMMUNITY): Payer: Self-pay

## 2020-08-04 ENCOUNTER — Ambulatory Visit: Payer: Medicaid Other | Admitting: Obstetrics & Gynecology

## 2020-08-15 ENCOUNTER — Telehealth: Payer: Self-pay | Admitting: Obstetrics & Gynecology

## 2020-08-15 ENCOUNTER — Other Ambulatory Visit: Payer: Self-pay | Admitting: Obstetrics & Gynecology

## 2020-08-15 MED ORDER — VALACYCLOVIR HCL 1 G PO TABS: 1000.0000 mg | ORAL_TABLET | Freq: Every day | ORAL | 11 refills | Status: DC

## 2020-08-15 NOTE — Telephone Encounter (Signed)
Patient wants to discuss Valtrex prescription and wants a call back from a nurse.

## 2020-08-15 NOTE — Telephone Encounter (Signed)
Pt has a herpes outbreak. Outbreak started yesterday. Please send in Valtrex. Thanks!! [place]

## 2020-08-17 ENCOUNTER — Other Ambulatory Visit: Payer: Self-pay

## 2020-08-17 ENCOUNTER — Ambulatory Visit
Admission: EM | Admit: 2020-08-17 | Discharge: 2020-08-17 | Disposition: A | Discharge status: Home / Self Care | Payer: Medicaid Other | Attending: Emergency Medicine | Admitting: Emergency Medicine

## 2020-08-17 ENCOUNTER — Encounter: Payer: Self-pay | Admitting: Emergency Medicine

## 2020-08-17 DIAGNOSIS — R059 Cough, unspecified: Secondary | ICD-10-CM | POA: Diagnosis not present

## 2020-08-17 MED ORDER — BENZONATATE 100 MG PO CAPS: 100.0000 mg | ORAL_CAPSULE | Freq: Three times a day (TID) | ORAL | 0 refills | Status: DC

## 2020-08-17 MED ORDER — GUAIFENESIN 100 MG/5ML PO SYRP: 100.0000 mg | ORAL_SOLUTION | ORAL | 0 refills | Status: DC | PRN

## 2020-08-17 MED ORDER — DEXTROMETHORPHAN HBR 15 MG PO CAPS: 15.0000 mg | ORAL_CAPSULE | Freq: Once | ORAL | 0 refills | Status: DC

## 2020-08-17 NOTE — ED Triage Notes (Signed)
Cough, nasal congestion, did have some nausea and diarrhea that is better now started last Saturday. Seen by pcp, given steroid shot, z pak and tussinex.  Pt stil does not feel any better.

## 2020-08-17 NOTE — ED Provider Notes (Signed)
[facility]    CSN: [id number] Arrival date & time: 08/17/20  1445      History   Chief Complaint No chief complaint on file.   HPI Janice Taylor is a 30 y.o. female.   Pt has been tx with z pack, inhaler, steriods, and cough meds a few days ago. States that she still has a cough and wants more cough meds. Denies any fever. No sob, . Asking for more cough medication.      Past Medical History:  Diagnosis Date  . ADHD (attention deficit hyperactivity disorder)   . Anxiety   . Clotting disorder ([place])   . Cough   . Depression   . Genital HSV   . Hypertension   . 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 Leg swelling   . Obesity 01/05/2013  . Thrombocytopenia ([place]) 11/25/2011   Consistent with ITP    Patient Active Problem List   Diagnosis Date Noted  . Boil of groin 03/31/2020  . Hemorrhoids 03/31/2020  . Hypertension 03/31/2020  . Encounter for IUD insertion 01/22/2019  . Anxiety 11/07/2018  . History of ITP 11/25/2011    Past Surgical History:  Procedure Laterality Date  . CESAREAN SECTION N/A 12/18/2018   Procedure: CESAREAN SECTION;  Surgeon: Woodroe Mode, MD;  Location: [facility] LD ORS;  Service: Obstetrics;  Laterality: N/A;  . TONSILECTOMY, ADENOIDECTOMY, BILATERAL MYRINGOTOMY AND TUBES      OB History    Gravida  3   Para  2   Term  1   Preterm  1   AB  1   Living  2     SAB  1   IAB      Ectopic      Multiple  0   Live Births  2            Home Medications    Prior to Admission medications   Medication Sig Start Date End Date Taking? Authorizing Provider  Dextromethorphan HBr (ROBITUSSIN LINGERING COUGHGELS) 15 MG CAPS Take 1 capsule (15 mg total) by mouth once for 1 dose. 08/17/20 08/17/20 Yes Marney Setting, NP  guaifenesin (ROBITUSSIN) 100 MG/5ML syrup Take 5-10 mLs (100-200 mg total) by mouth every 4 (four) hours as needed for cough. 08/17/20  Yes Morley Kos L, NP   amLODipine (NORVASC) 10 MG tablet Take 10 mg by mouth daily. 02/14/20   [provider]  butalbital-acetaminophen-caffeine (FIORICET) 50-325-40 MG tablet Take 1-2 tablets by mouth every 6 (six) hours as needed for headache. 04/13/20 04/13/21  Triplett, Tammy, PA-C  cyclobenzaprine (FLEXERIL) 10 MG tablet TAKE 1 TABLET EVERY 8 HOURS AS NEEDED FOR MUSCLE SPASMS. 07/17/20   Florian Buff, MD  hydrochlorothiazide (HYDRODIURIL) 25 MG tablet Take 50 mg by mouth daily. 01/10/20   [provider]  LORazepam (ATIVAN) 0.5 MG tablet Take 0.5 mg by mouth every 8 (eight) hours as needed for anxiety.    [provider]  pramoxine-hydrocortisone (PROCTOCREAM-HC) 1-1 % rectal cream Place 1 application rectally 2 (two) times daily. 03/31/20   Estill Dooms, NP  silver sulfADIAZINE (SILVADENE) 1 % cream Apply 1 application topically daily. 03/31/20   Estill Dooms, NP  valACYclovir (VALTREX) 1000 MG tablet Take 1 tablet (1,000 mg total) by mouth daily. 08/15/20   Florian Buff, MD  zolpidem (AMBIEN) 10 MG tablet Take 1 tablet (10 mg total) by mouth at bedtime as needed for sleep. 03/12/19  Florian Buff, MD  zolpidem (AMBIEN) 10 MG tablet Take 1 tablet (10 mg total) by mouth at bedtime. 07/21/20   Cory Munch, PA-C    Family History Family History  Problem Relation Age of Onset  . Diabetes Maternal Uncle   . Kidney disease Maternal Uncle        renal failure/dialysis  . Heart attack Maternal Grandmother     Social History Social History   Tobacco Use  . Smoking status: Never Smoker  . Smokeless tobacco: Never Used  Vaping Use  . Vaping Use: Never used  Substance Use Topics  . Alcohol use: No  . Drug use: No     Allergies   Patient has no known allergies.   Review of Systems Review of Systems  Constitutional: Negative.   HENT: Positive for postnasal drip.   Respiratory: Positive for cough.   Gastrointestinal: Negative.   Genitourinary: Negative.    Neurological: Negative.      Physical Exam Triage Vital Signs ED Triage Vitals  Enc Vitals Group     BP --      Pulse Rate 08/17/20 1532 92     Resp 08/17/20 1532 20     Temp 08/17/20 1532 98.7 F (37.1 C)     Temp Source 08/17/20 1532 Oral     SpO2 08/17/20 1532 97 %     Weight --      Height --      Head Circumference --      Peak Flow --      Pain Score 08/17/20 1530 0     Pain Loc --      Pain Edu? --      Excl. in [place]? --    No data found.  Updated Vital Signs BP (!) 175/89   Pulse 92   Temp 98.7 F (37.1 C) (Oral)   Resp 20   SpO2 97%   Visual Acuity  Physical Exam Constitutional:      Appearance: Normal appearance.  HENT:     Nose: Nose normal.     Mouth/Throat:     Mouth: Mucous membranes are moist.  Eyes:     Pupils: Pupils are equal, round, and reactive to light.  Cardiovascular:     Rate and Rhythm: Normal rate.     Pulses: Normal pulses.  Pulmonary:     Effort: Pulmonary effort is normal.  Skin:    General: Skin is warm.  Neurological:     Mental Status: She is alert.      UC Treatments / Results  Labs (all labs ordered are listed, but only abnormal results are displayed) Labs Reviewed - No data to display  EKG   Radiology No results found.  Procedures Procedures (including critical care time)  Medications Ordered in UC Medications - No data to display  Initial Impression / Assessment and Plan / UC Course  I have reviewed the triage vital signs and the nursing notes.  Pertinent labs & imaging results that were available during my care of the patient were reviewed by me and considered in my medical decision making (see chart for details).    Will need to take cough meds as needed Expressed that she may have a cough that lingers for upto 2 weeks  Take allergy meds daily Avoid driving while taking meds if it makes her sleepy   Final Clinical Impressions(s) / UC Diagnoses   Final diagnoses:  Cough   Discharge  Instructions   None  ED Prescriptions    Medication Sig Dispense Auth. Provider   guaifenesin (ROBITUSSIN) 100 MG/5ML syrup Take 5-10 mLs (100-200 mg total) by mouth every 4 (four) hours as needed for cough. 60 mL Marney Setting, NP   Dextromethorphan HBr (ROBITUSSIN LINGERING COUGHGELS) 15 MG CAPS Take 1 capsule (15 mg total) by mouth once for 1 dose. 28 capsule Marney Setting, NP     PDMP not reviewed this encounter.   Marney Setting, NP 08/17/20 1549

## 2020-08-20 ENCOUNTER — Other Ambulatory Visit: Payer: Self-pay | Admitting: Obstetrics & Gynecology

## 2020-09-17 ENCOUNTER — Other Ambulatory Visit: Payer: Self-pay | Admitting: Obstetrics & Gynecology

## 2020-09-30 ENCOUNTER — Ambulatory Visit: Payer: Medicaid Other | Admitting: Adult Health

## 2020-10-08 ENCOUNTER — Other Ambulatory Visit: Payer: Medicaid Other | Admitting: Advanced Practice Midwife

## 2020-10-10 ENCOUNTER — Other Ambulatory Visit: Payer: Self-pay

## 2020-10-10 ENCOUNTER — Other Ambulatory Visit: Payer: Medicaid Other | Admitting: Obstetrics and Gynecology

## 2020-10-17 ENCOUNTER — Other Ambulatory Visit: Payer: Self-pay | Admitting: Obstetrics & Gynecology

## 2020-10-23 ENCOUNTER — Other Ambulatory Visit: Payer: Self-pay | Admitting: *Deleted

## 2020-10-23 MED ORDER — CYCLOBENZAPRINE HCL 10 MG PO TABS: ORAL_TABLET | ORAL | 0 refills | Status: DC

## 2020-11-05 IMAGING — US US MFM FETAL BPP W/NONSTRESS
1 series · 15 of 22 positions shown · non-contrast
Comparison: none

[Series 1: us mfm fetal bpp w/nonstress · 22 acquisitions, 15 frames shown]
[im 1/22]
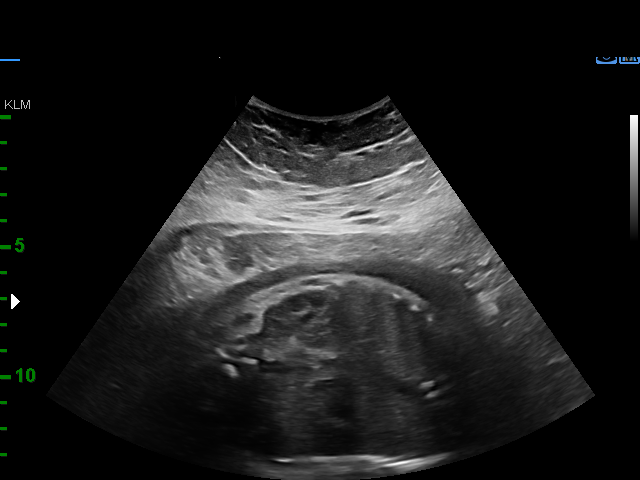
[im 3/22]
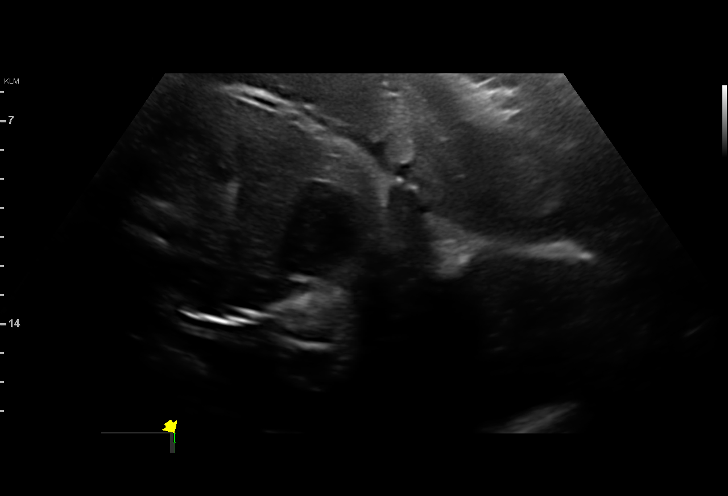
[im 4/22]
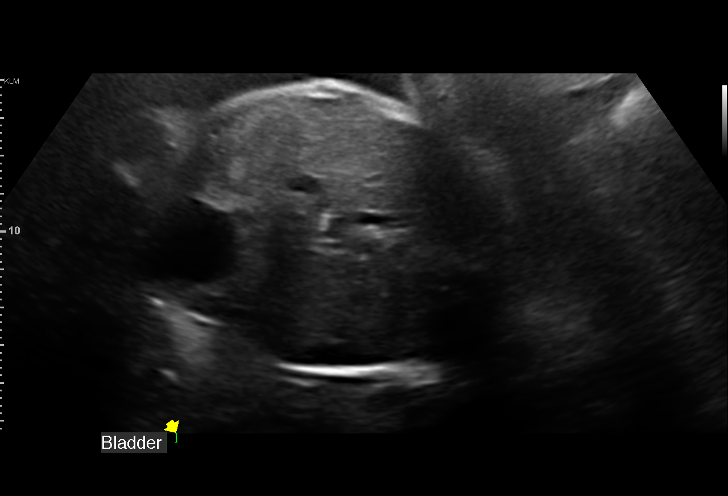
[im 6/22]
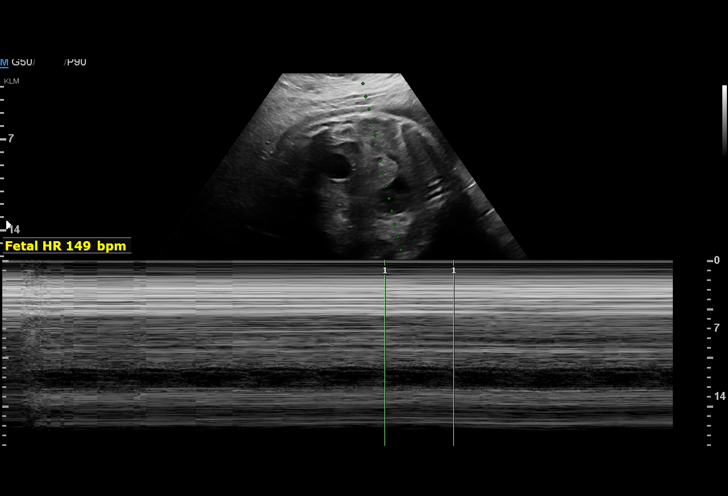
[im 7/22]
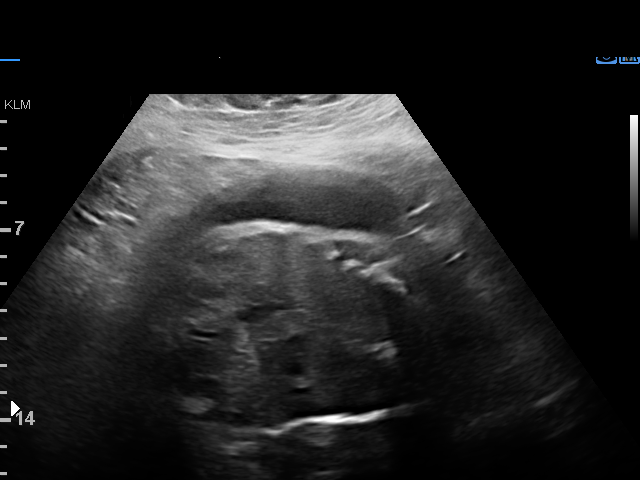
[im 9/22]
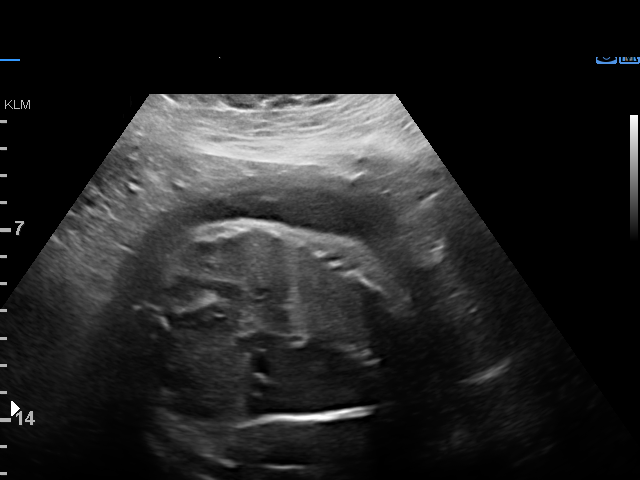
[im 10/22]
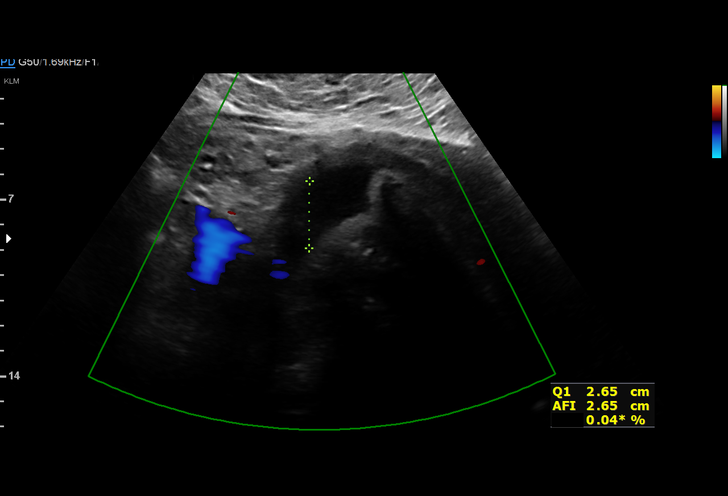
[im 12/22]
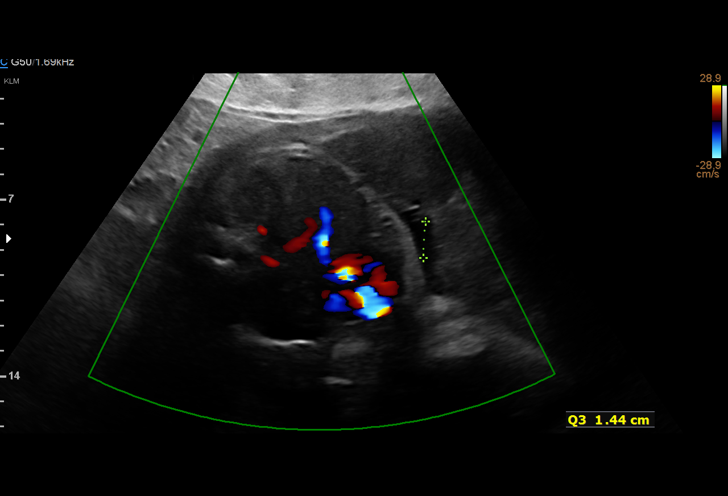
[im 13/22]
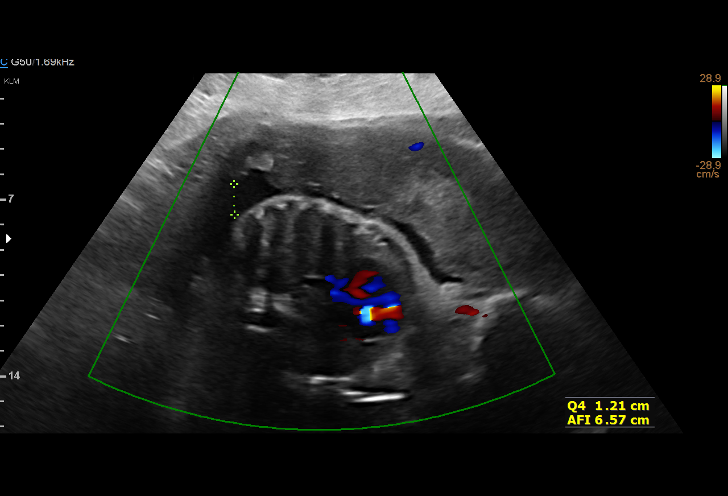
[im 14/22]
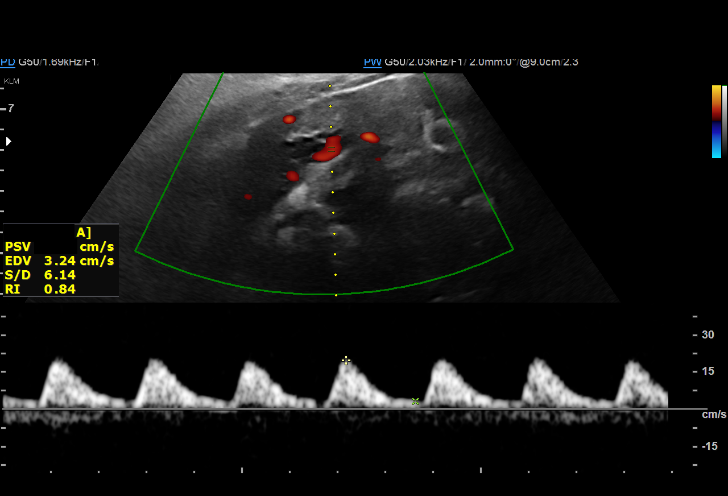
[im 16/22]
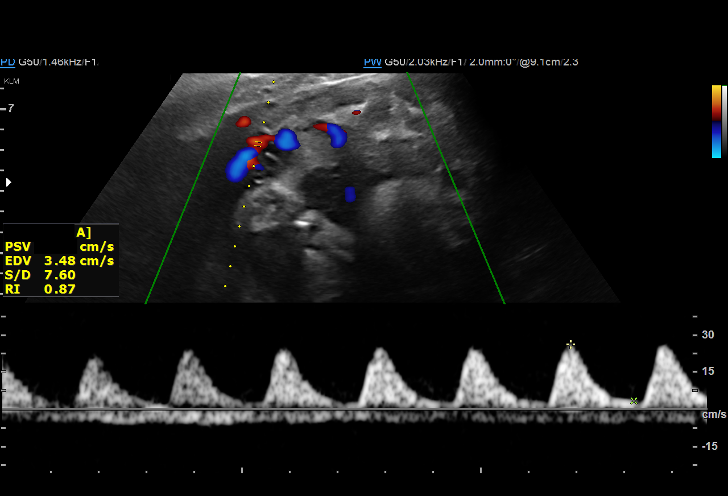
[im 17/22]
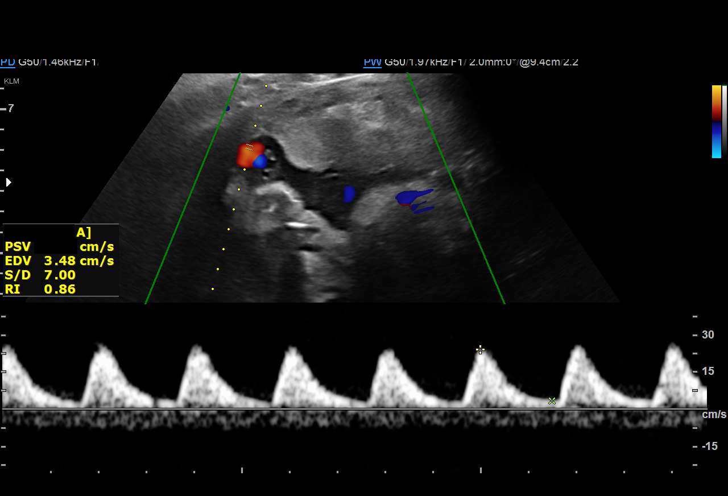
[im 19/22]
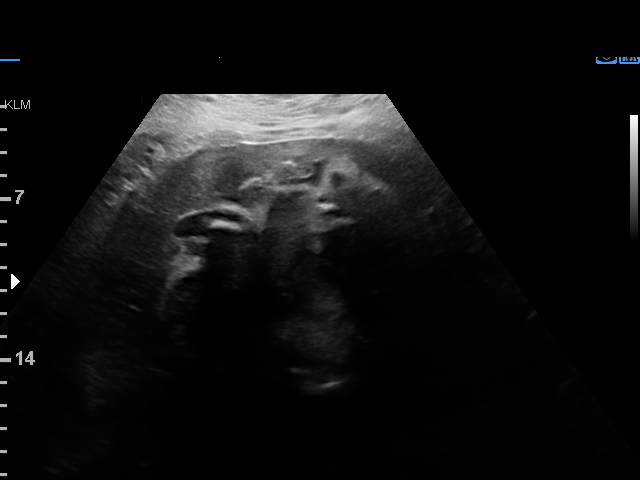
[im 20/22]
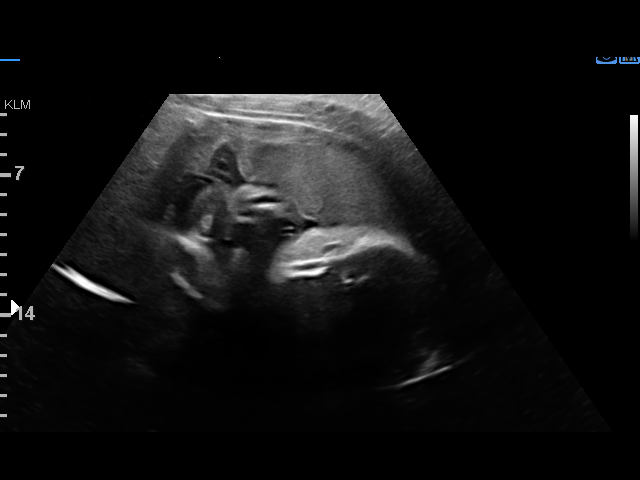
[im 22/22]
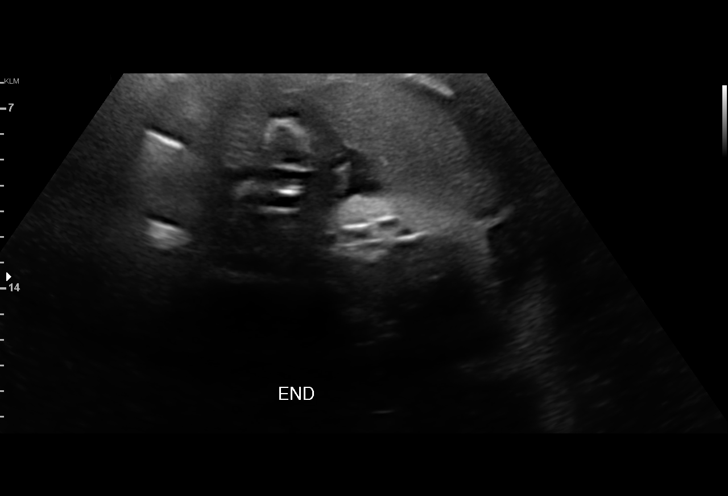

[15 of 22 positions shown; findings below may reference images not displayed]

OB/GYN
                   CEEJAY CNM

     W/NONSTRESS
 ----------------------------------------------------------------------

 ----------------------------------------------------------------------
Indications

  Small for gestational age fetus affecting
  management of mother
  31 weeks gestation of pregnancy
  Maternal morbid obesity
  Abnormal biochemical screen for spina bifida
  Gestational hypertension without significant
  proteinuria, third trimester
  Marginal insertion of umbilical cord affecting
  management of mother in third trimester
 ----------------------------------------------------------------------
Vital Signs

                                                Height:        5'4"
Fetal Evaluation

 Num Of Fetuses:         1
 Fetal Heart Rate(bpm):  148
 Cardiac Activity:       Observed
 Presentation:           Cephalic

 Amniotic Fluid
 AFI FV:      Within normal limits

 AFI Sum(cm)     %Tile       Largest Pocket(cm)
 6.56            < 3
 RUQ(cm)       RLQ(cm)       LUQ(cm)        LLQ(cm)

Biophysical Evaluation

 Amniotic F.V:   Pocket => 2 cm two         F. Tone:        Observed
                 planes
 F. Movement:    Not Observed               Score:          [DATE]
 F. Breathing:   Observed
OB History

 Gravidity:    3         Term:   1        Prem:   0        SAB:   1
 TOP:          0       Ectopic:  0        Living: 1
Gestational Age

 LMP:           31w 5d        Date:  04/29/18                 EDD:   02/03/19
 Best:          31w 5d     Det. By:  LMP  (04/29/18)          EDD:   02/03/19
Anatomy

 Stomach:               Appears normal, left   Bladder:                Appears normal
                        sided
Doppler - Fetal Vessels

 Umbilical Artery
  S/D     %tile                                            ADFV    RDFV
  8.2    > 97.5                                              Yes      No

Impression

 Gestational HTN
 Known IUGR with EFW 5th%.
 UA Dopplers- AEDF
 BPP [DATE] (-2  Tone) The NST was attempted but unreadable.

 Prior study reported amniotic fluid 16 cm however after
 reevaluation the fluid was some what less ^11 cm. Ms.
 [place] AFV today measures ^ 6 cm which is decreased but
 objectively does not meet the criteria for oligohydramnios.
 S/P BMZ
Recommendations

 TO [facility] For further monitoring.
 AFI is scheduled to be repeated in 24 hours if abnormal
 consider admission to L&D for hydration and or possible
 delivery if oligohydramnios persist.
 If resolved delivery by 34 weeks.

## 2020-11-06 IMAGING — US US MFM FETAL BPP WO NON STRESS
1 series · 15 of 28 positions shown · non-contrast
Comparison: none

[Series 1: us mfm fetal bpp wo non stress · 36 acquisitions, 15 frames shown]
[im 1/36]
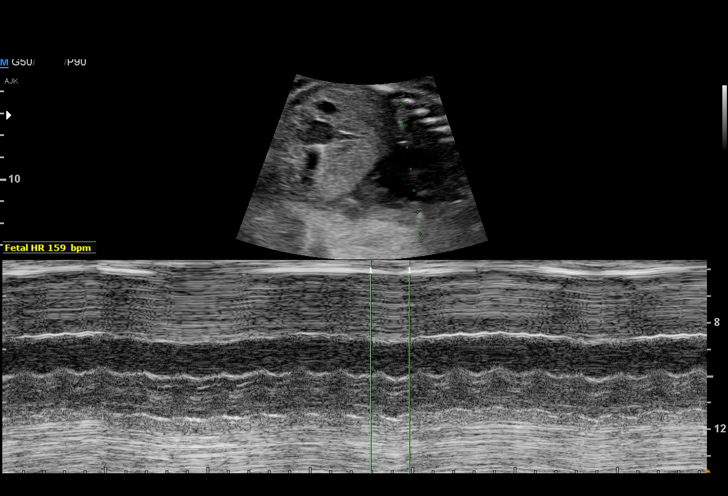
[im 3/36]
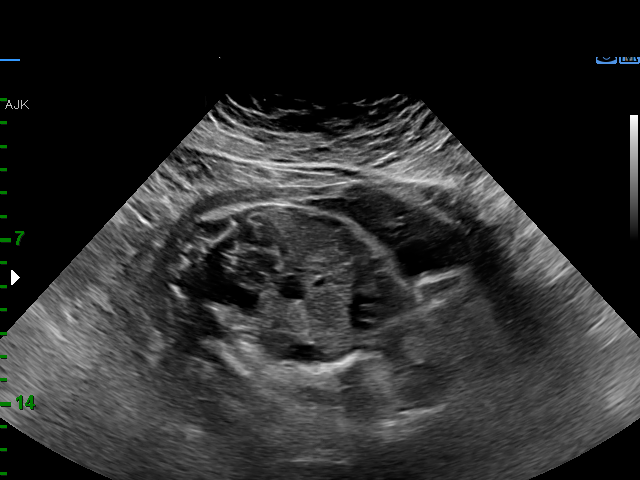
[im 6/36]
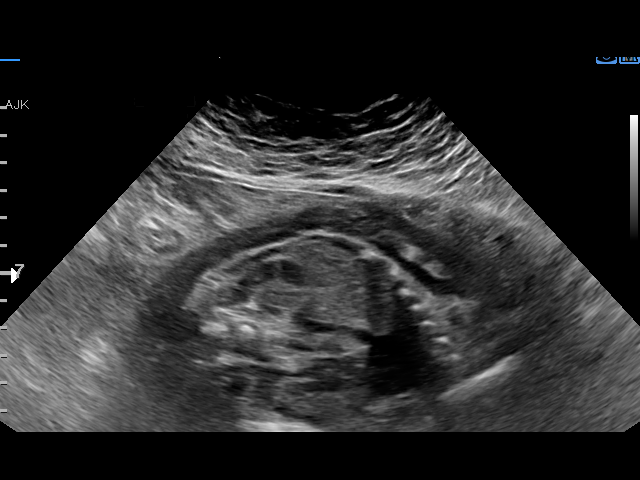
[im 8/36]
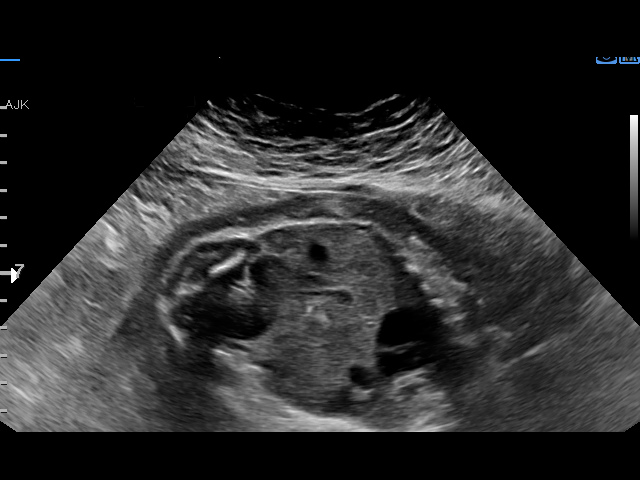
[im 11/36]
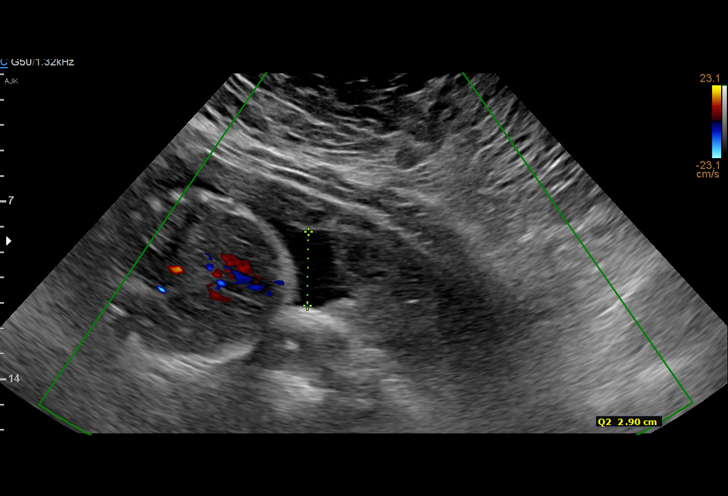
[im 13/36]
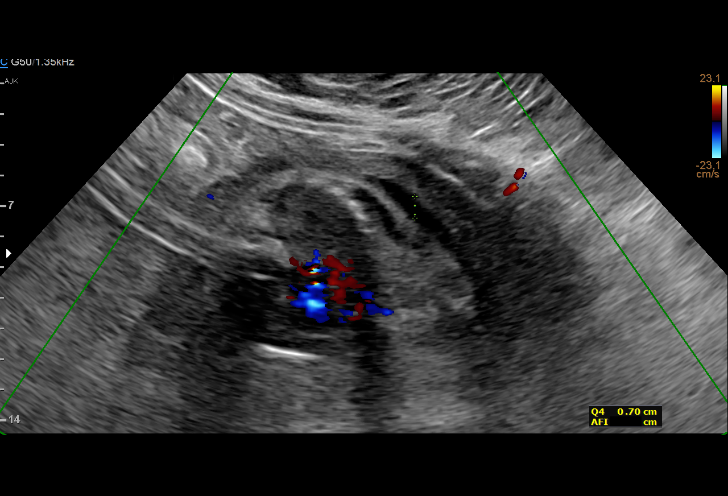
[im 16/36]
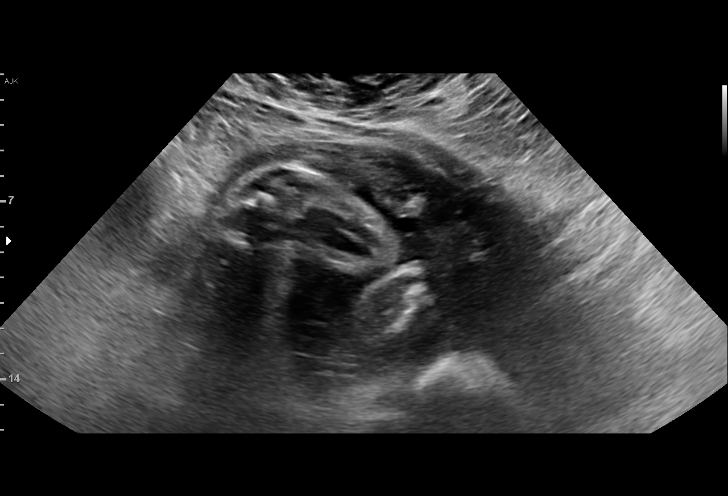
[im 19/36]
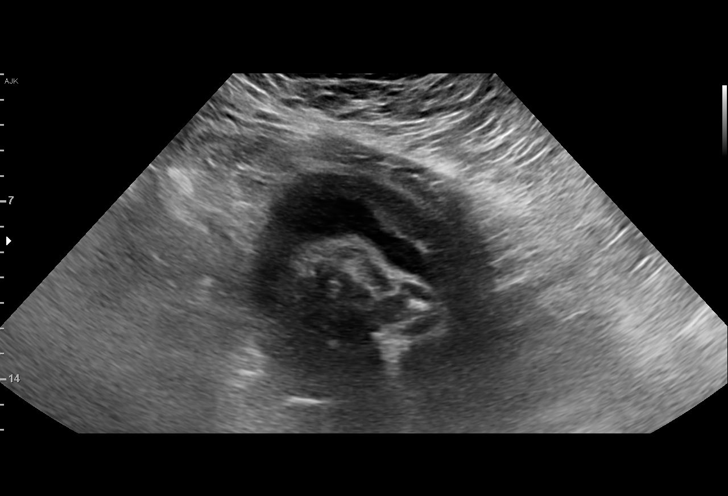
[im 20/36]
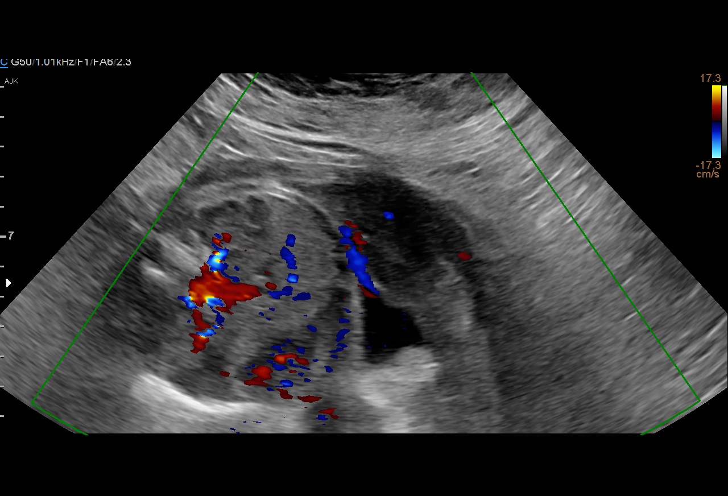
[im 23/36]
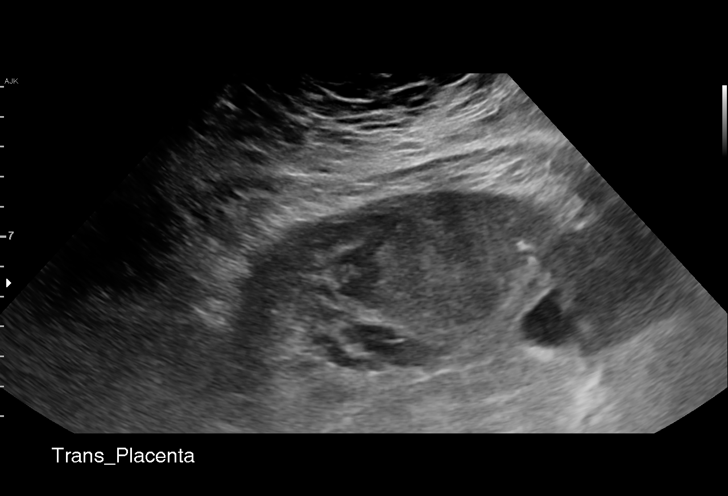
[im 25/36]
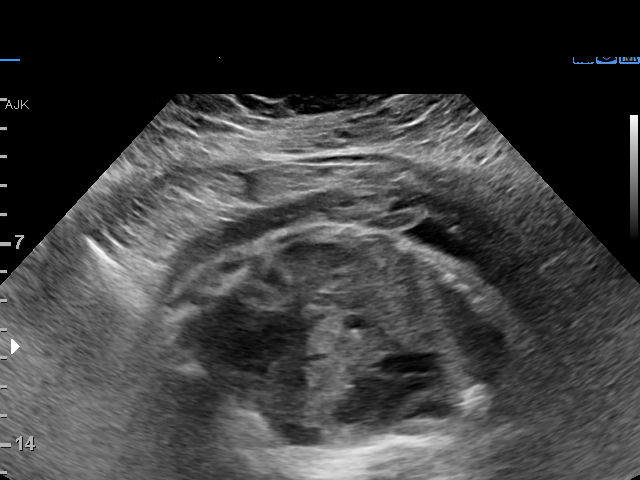
[im 28/36]
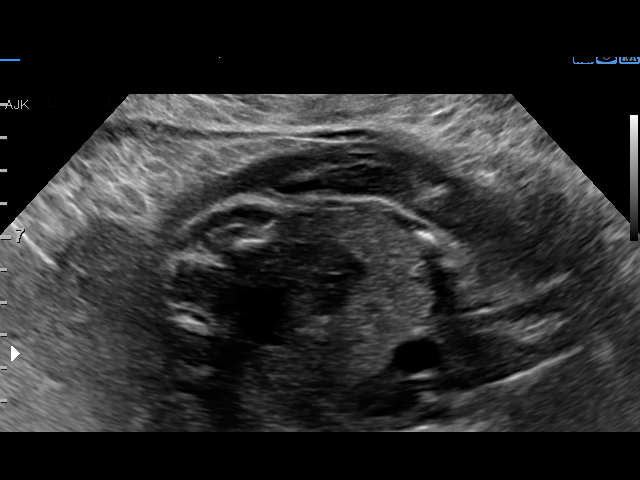
[im 30/36]
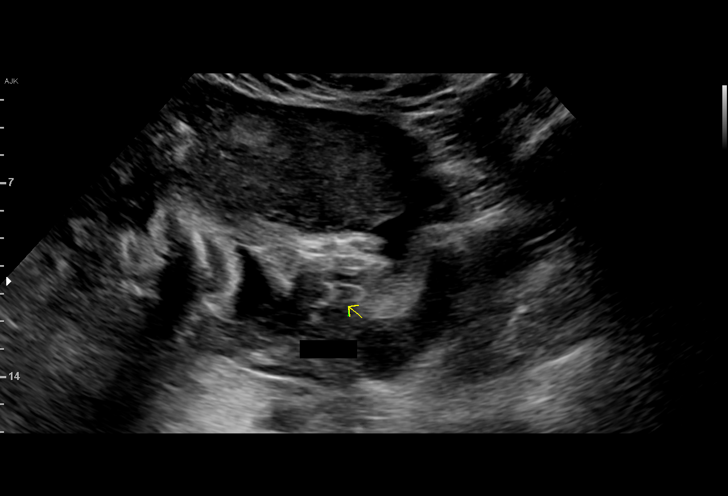
[im 33/36]
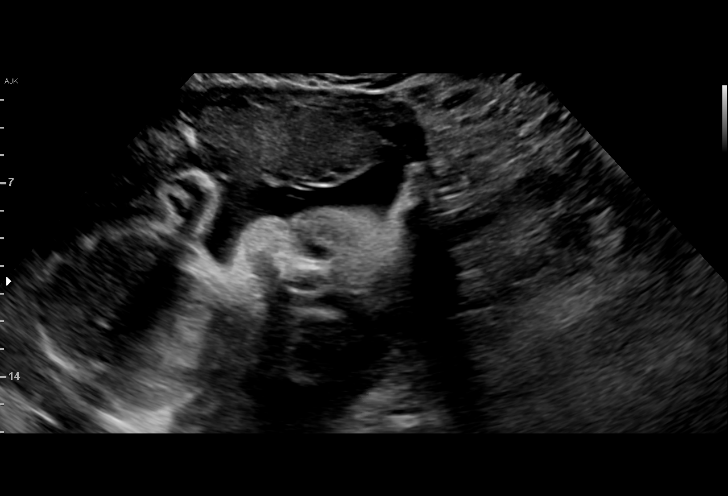
[im 36/36]
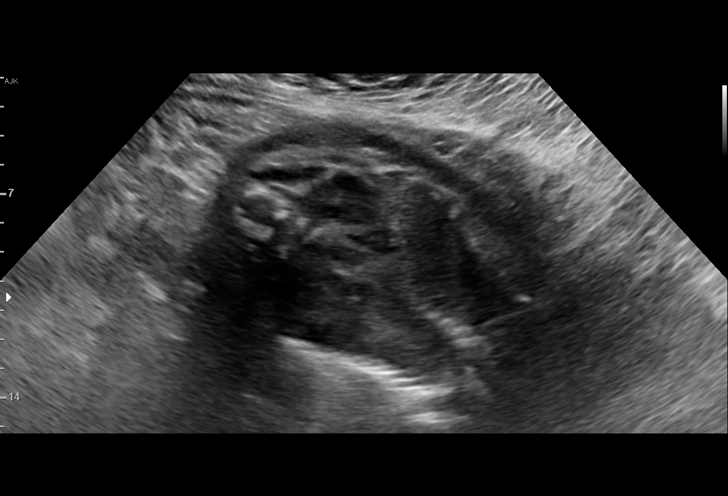

[15 of 28 positions shown; findings below may reference images not displayed]

OB/GYN
                   AMNON CNM

 ----------------------------------------------------------------------

 ----------------------------------------------------------------------
Indications

  Small for gestational age fetus affecting
  management of mother
  Maternal morbid obesity
  Abnormal biochemical screen for spina bifida
  Gestational hypertension without significant
  proteinuria, third trimester
  Marginal insertion of umbilical cord affecting
  management of mother in third trimester
  31 weeks gestation of pregnancy
 ----------------------------------------------------------------------
Vital Signs

                                                Height:        5'4"
Fetal Evaluation

 Num Of Fetuses:          1
 Fetal Heart Rate(bpm):   159
 Cardiac Activity:        Observed
 Presentation:            Cephalic
 Placenta:                Anterior

 Amniotic Fluid
 AFI FV:      Within normal limits

 AFI Sum(cm)     %Tile       Largest Pocket(cm)
 10.77           22

 RUQ(cm)       RLQ(cm)       LUQ(cm)        LLQ(cm)

 Comment:    Stomach and bladder noted.
Biophysical Evaluation

 Amniotic F.V:   Within normal limits       F. Tone:         Observed
 F. Movement:    Observed                   Score:           [DATE]
 F. Breathing:   Observed
OB History

 Gravidity:    3         Term:   1        Prem:   0        SAB:   1
 TOP:          0       Ectopic:  0        Living: 1
Gestational Age

 LMP:           31w 6d        Date:  04/29/18                 EDD:   02/03/19
 Best:          31w 6d     Det. By:  LMP  (04/29/18)          EDD:   02/03/19
Impression

 Biophysical profile [DATE]
 UA Dopplers previously AEDF
 Known IUGR
Recommendations

 Management per inpatient team

## 2020-11-15 ENCOUNTER — Other Ambulatory Visit: Payer: Self-pay | Admitting: Obstetrics & Gynecology

## 2020-11-20 ENCOUNTER — Other Ambulatory Visit: Payer: Self-pay

## 2020-11-20 ENCOUNTER — Other Ambulatory Visit (HOSPITAL_COMMUNITY)
Admission: RE | Admit: 2020-11-20 | Discharge: 2020-11-20 | Disposition: A | Discharge status: Home / Self Care | Payer: Medicaid Other | Source: Ambulatory Visit | Attending: Internal Medicine | Admitting: Internal Medicine

## 2020-11-20 DIAGNOSIS — Z1389 Encounter for screening for other disorder: Secondary | ICD-10-CM | POA: Insufficient documentation

## 2020-11-20 LAB — VITAMIN B12: Vitamin B-12: 269 pg/mL (ref 180–914)

## 2020-11-20 LAB — CBC WITH DIFFERENTIAL/PLATELET
Abs Immature Granulocytes: 0.03 10*3/uL (ref 0.00–0.07)
Basophils Absolute: 0 10*3/uL (ref 0.0–0.1)
Basophils Relative: 0 %
Eosinophils Absolute: 0.1 10*3/uL (ref 0.0–0.5)
Eosinophils Relative: 1 %
HCT: 41.6 % (ref 36.0–46.0)
Hemoglobin: 13.3 g/dL (ref 12.0–15.0)
Immature Granulocytes: 0 %
Lymphocytes Relative: 25 %
Lymphs Abs: 1.9 10*3/uL (ref 0.7–4.0)
MCH: 28.6 pg (ref 26.0–34.0)
MCHC: 32 g/dL (ref 30.0–36.0)
MCV: 89.5 fL (ref 80.0–100.0)
Monocytes Absolute: 0.3 10*3/uL (ref 0.1–1.0)
Monocytes Relative: 4 %
Neutro Abs: 5.2 10*3/uL (ref 1.7–7.7)
Neutrophils Relative %: 70 %
Platelets: 227 10*3/uL (ref 150–400)
RBC: 4.65 MIL/uL (ref 3.87–5.11)
RDW: 13.5 % (ref 11.5–15.5)
WBC: 7.6 10*3/uL (ref 4.0–10.5)
nRBC: 0 % (ref 0.0–0.2)

## 2020-11-20 LAB — COMPREHENSIVE METABOLIC PANEL
ALT: 24 U/L (ref 0–44)
AST: 19 U/L (ref 15–41)
Albumin: 3.7 g/dL (ref 3.5–5.0)
Alkaline Phosphatase: 97 U/L (ref 38–126)
Anion gap: 8 (ref 5–15)
BUN: 12 mg/dL (ref 6–20)
CO2: 29 mmol/L (ref 22–32)
Calcium: 8.5 mg/dL — ABNORMAL LOW (ref 8.9–10.3)
Chloride: 97 mmol/L — ABNORMAL LOW (ref 98–111)
Creatinine, Ser: 0.7 mg/dL (ref 0.44–1.00)
GFR, Estimated: >60 mL/min (ref >60)
Glucose, Bld: 232 mg/dL — ABNORMAL HIGH (ref 70–99)
Potassium: 4.1 mmol/L (ref 3.5–5.1)
Sodium: 134 mmol/L — ABNORMAL LOW (ref 135–145)
Total Bilirubin: 0.4 mg/dL (ref 0.3–1.2)
Total Protein: 7 g/dL (ref 6.5–8.1)

## 2020-11-20 LAB — LIPID PANEL
Cholesterol: 194 mg/dL (ref 0–200)
HDL: 52 mg/dL (ref >40)
LDL Cholesterol: 124 mg/dL — ABNORMAL HIGH (ref 0–99)
Total CHOL/HDL Ratio: 3.7 RATIO
Triglycerides: 92 mg/dL (ref <150)
VLDL: 18 mg/dL (ref 0–40)

## 2020-11-20 LAB — VITAMIN D 25 HYDROXY (VIT D DEFICIENCY, FRACTURES): Vit D, 25-Hydroxy: 24.67 ng/mL — ABNORMAL LOW (ref 30–100)

## 2020-11-20 LAB — TSH: TSH: 1.84 u[IU]/mL (ref 0.350–4.500)

## 2020-12-03 ENCOUNTER — Ambulatory Visit (INDEPENDENT_AMBULATORY_CARE_PROVIDER_SITE_OTHER): Payer: Medicaid Other | Admitting: Adult Health

## 2020-12-03 ENCOUNTER — Other Ambulatory Visit: Payer: Self-pay

## 2020-12-03 ENCOUNTER — Encounter: Payer: Self-pay | Admitting: Adult Health

## 2020-12-03 VITALS — BP 156/108 | HR 102 | Ht 63.0 in | Wt 375.4 lb

## 2020-12-03 DIAGNOSIS — K649 Unspecified hemorrhoids: Secondary | ICD-10-CM

## 2020-12-03 DIAGNOSIS — R195 Other fecal abnormalities: Secondary | ICD-10-CM | POA: Diagnosis not present

## 2020-12-03 LAB — HEMOCCULT GUIAC POC 1CARD (OFFICE): Fecal Occult Blood, POC: POSITIVE — AB

## 2020-12-03 MED ORDER — HYDROCORTISONE ACE-PRAMOXINE 1-1 % EX CREA: 1.0000 "application " | TOPICAL_CREAM | Freq: Two times a day (BID) | CUTANEOUS | 1 refills | Status: DC

## 2020-12-03 MED ORDER — HYDROCORTISONE ACETATE 25 MG RE SUPP: 25.0000 mg | Freq: Two times a day (BID) | RECTAL | 0 refills | Status: DC

## 2020-12-03 MED ORDER — LIDOCAINE 5 % EX OINT: TOPICAL_OINTMENT | Freq: Four times a day (QID) | CUTANEOUS | 1 refills | Status: DC | PRN

## 2020-12-03 NOTE — Progress Notes (Signed)
  Subjective:     Patient ID: Janice Taylor, female   DOB: 12/30/1990, 30 y.o.   MRN: [id number]  HPI Janice Taylor is a 30 year old white female,single [phone], worked in for complaints of pain with hemorrhoids for last 3 days, denies any bleeding. She says it hurts to sit. She said could not get in to PCP and [facility] can't see til December. PCP is Dr Janice Taylor.  Review of Systems Has rectal pain, has hemorrhoids for 3 days,has tried sitting in tub without relief  Has IBS Denies any rectal bleeding Reviewed past medical,surgical, social and family history. Reviewed medications and allergies.     Objective:   Physical Exam BP (!) 156/108 (BP Location: Left Arm, Patient Position: Sitting, Cuff Size: Normal)   Pulse (!) 102   Ht '5\' 3"'$  (1.6 m)   Wt (!) 375 lb 6.4 oz (170.3 kg)   Breastfeeding No   BMI 66.50 kg/m  Skin warm and dry. On rectal exam, no external hemorrhoid, has good tone and has internal hemorrhoids, largest ar 1-2 o'clock, hemoccult +.   Fal risk is low  Upstream - 12/03/20 1519       Pregnancy Intention Screening   Does the patient want to become pregnant in the next year? Ok Either Way    Does the patient's partner want to become pregnant in the next year? Ok Either Way    Would the patient like to discuss contraceptive options today? No      Contraception Wrap Up   Current Method IUD or IUS    End Method IUD or IUS    Contraception Counseling Provided No            Examination chaperoned by Levy Pupa LPN. Assessment:     1. Hemorrhoids, unspecified hemorrhoid type Meds ordered this encounter  Medications   pramoxine-hydrocortisone (PROCTOCREAM-HC) 1-1 % rectal cream    Sig: Place 1 application rectally 2 (two) times daily.    Dispense:  30 g    Refill:  1    Order Specific Question:   Supervising Provider    Answer:   Elonda Husky, LUTHER H [[id number]]   lidocaine (XYLOCAINE) 5 % ointment    Sig: Apply topically 4 (four) times daily as needed.    Dispense:  35.44 g     Refill:  1    Order Specific Question:   Supervising Provider    Answer:   Tania Ade H [[id number]]   hydrocortisone (ANUSOL-HC) 25 MG suppository    Sig: Place 1 suppository (25 mg total) rectally 2 (two) times daily.    Dispense:  12 suppository    Refill:  0    Order Specific Question:   Supervising Provider    Answer:   Florian Buff [[id number]]    Referred Dr Arnoldo Morale appointment made for 12/09/20 at 12N, but they will call if cancellation  2. Positive fecal occult blood test     Plan:     If pain increases go to ER Follow up prn

## 2020-12-05 ENCOUNTER — Telehealth: Payer: Self-pay | Admitting: Adult Health

## 2020-12-05 NOTE — Telephone Encounter (Signed)
Janice Taylor called about pain from hemorrhoids. She didn't get the topical cream,stated it was over $100. Lidocaine works but it burns and puts blisters if it gets anywhere else. Has appt with Arnoldo Morale on Tuesday at 8:30am. Wanting to see if she can get something for pain til then.

## 2020-12-05 NOTE — Telephone Encounter (Signed)
Janice Taylor says she could not afford analpram or anusol HC, over $100,lidocaine helps but burns. Will cal [employer] about compounded cream with HC,lidocaine,pramoxine and phenylephrine 30 gm is $50. Has Appt with Dr Constance Haw Tuesday at 8:30 am

## 2020-12-09 ENCOUNTER — Ambulatory Visit: Payer: Self-pay | Admitting: General Surgery

## 2020-12-09 ENCOUNTER — Other Ambulatory Visit: Payer: Self-pay

## 2020-12-09 ENCOUNTER — Ambulatory Visit (INDEPENDENT_AMBULATORY_CARE_PROVIDER_SITE_OTHER): Payer: Medicaid Other | Admitting: General Surgery

## 2020-12-09 ENCOUNTER — Telehealth: Payer: Self-pay | Admitting: Adult Health

## 2020-12-09 ENCOUNTER — Encounter: Payer: Self-pay | Admitting: General Surgery

## 2020-12-09 VITALS — BP 147/87 | HR 94 | Temp 98.3°F | Resp 18 | Ht 63.0 in | Wt 382.0 lb

## 2020-12-09 DIAGNOSIS — K602 Anal fissure, unspecified: Secondary | ICD-10-CM | POA: Diagnosis not present

## 2020-12-09 NOTE — Patient Instructions (Addendum)
Keep your stools regular and soft. You may need to start taking fiber.   You are being prescribed Diltiazem 2%/ Lidocaine 5% ointment for Fissure to be placed on your anal opening 4 times a day.  This will help your sphincter muscle relax. You can take tylenol and ibuprofen for pain.   You need to see GI regarding your IBS symptoms.

## 2020-12-09 NOTE — Progress Notes (Signed)
[facility] History and Physical  Reason for Referral: Hemorrhoids  Referring Physician:  Derrek Monaco, NPO  Chief Complaint   New Patient (Initial Visit)     Janice Taylor is a 30 y.o. female.  HPI:  Janice Taylor is a 30 yo with anxiety, IBS on bentyl per her report prescribed by Dr. Hilma Favors, who was seen by Derrek Monaco, NP for rectal pain and bleeding. The patient says that in the past few weeks she has been having significant diarrhea mulitple times a day and was just put on bentyl. She can have some hard stools and be constipated on occasion. She was noted to have no external hemorrhoids on exam but presumed internal hemorrhoids.  She describes to me a severe, sharp cutting pain / tearing pain with Bms.  She says that she has never really noticed any lumps or bumps but is not sure she would know what to look for.  She has had 2 children 13 and 2. The blood is bright red and on the toilet paper. She has not had blood to fill the toilet and maybe has noticed some mucus.  She has no diagnosis of IBD, UC / Chrohn's. She has not seen a GI physician but has been referred and was told it would be December before she would be seen.   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   Consistent with ITP    Leg swelling    Obesity 01/05/2013   Thrombocytopenia ([place]) 11/25/2011   Consistent with ITP    Past Surgical History:  Procedure Laterality Date   CESAREAN SECTION N/A 12/18/2018   Procedure: CESAREAN SECTION;  Surgeon: Woodroe Mode, MD;  Location: [facility] LD ORS;  Service: Obstetrics;  Laterality: N/A;   TONSILECTOMY, ADENOIDECTOMY, BILATERAL MYRINGOTOMY AND TUBES      Family History  Problem Relation Age of Onset   Diabetes Maternal Uncle    Kidney disease Maternal Uncle         renal failure/dialysis   Heart attack Maternal Grandmother     Social History   Tobacco Use   Smoking status: Never   Smokeless tobacco: Never  Vaping Use   Vaping Use: Never used  Substance Use Topics   Alcohol use: No   Drug use: No    Medications: I have reviewed the patient's current medications. Allergies as of 12/09/2020       Reactions   Codeine Nausea And Vomiting   Ondansetron Hcl Hives   Not effective` Not effective` Not effective` Not effective` Not effective` Not effective`        Medication List        Accurate as of December 09, 2020  9:22 AM. If you have any questions, ask your nurse or doctor.          ALPRAZolam 1 MG tablet Commonly known as: XANAX Take 1 mg by mouth 3 (three) times daily as needed.   amLODipine 10 MG tablet Commonly known as: NORVASC Take 10 mg by mouth daily.   butalbital-acetaminophen-caffeine 50-325-40 MG tablet Commonly known as: FIORICET Take 1-2 tablets by mouth every 6 (six) hours as needed for headache.   cyclobenzaprine 10 MG tablet Commonly known as: FLEXERIL TAKE 1 TABLET EVERY 8 HOURS AS NEEDED FOR  MUSCLE SPASMS.   dicyclomine 20 MG tablet Commonly known as: BENTYL Take 20 mg by mouth 4 (four) times daily.   escitalopram 10 MG tablet Commonly known as: LEXAPRO   hydrochlorothiazide 25 MG tablet Commonly known as: HYDRODIURIL Take 25 mg by mouth daily.   hydrocortisone 25 MG suppository Commonly known as: ANUSOL-HC Place 1 suppository (25 mg total) rectally 2 (two) times daily.   KYLEENA IU by Intrauterine route.   lidocaine 5 % ointment Commonly known as: XYLOCAINE Apply topically 4 (four) times daily as needed.   metFORMIN 500 MG tablet Commonly known as: GLUCOPHAGE Take 500 mg by mouth daily.   pramoxine-hydrocortisone 1-1 % rectal cream Commonly known as: PROCTOCREAM-HC Place 1 application rectally 2 (two) times daily.   valACYclovir 1000 MG tablet Commonly known as:  Valtrex Take 1 tablet (1,000 mg total) by mouth daily.   zolpidem 10 MG tablet Commonly known as: AMBIEN Take 1 tablet (10 mg total) by mouth at bedtime.         ROS:  A comprehensive review of systems was negative except for: Gastrointestinal: positive for rectal bleeding, sharp tearing anal pain Genitourinary: positive for frequency Hematologic/lymphatic: positive for clotting issues  Blood pressure (!) 147/87, pulse 94, temperature 98.3 F (36.8 C), temperature source Other (Comment), resp. rate 18, height '5\' 3"'$  (1.6 m), weight (!) 382 lb (173.3 kg), SpO2 94 %, not currently breastfeeding. Physical Exam Vitals reviewed.  Constitutional:      Appearance: She is obese.  HENT:     Head: Normocephalic.     Nose: Nose normal.  Eyes:     Extraocular Movements: Extraocular movements intact.  Cardiovascular:     Rate and Rhythm: Normal rate and regular rhythm.  Pulmonary:     Effort: Pulmonary effort is normal.     Breath sounds: Normal breath sounds.  Abdominal:     General: There is no distension.     Palpations: Abdomen is soft.     Tenderness: There is no abdominal tenderness.  Genitourinary:    Comments: No external hemorrhoids noted, anterior skin tag and suspected anterior healing/ chronic fissure, tender on internal exam, spasm / tight anal tone  Musculoskeletal:        General: Normal range of motion.     Cervical back: Normal range of motion.  Skin:    General: Skin is warm.  Neurological:     General: No focal deficit present.     Mental Status: She is alert.  Psychiatric:        Mood and Affect: Mood normal.        Thought Content: Thought content normal.        Judgment: Judgment normal.    Results: Lab Results  Component Value Date   WBC 7.6 11/20/2020   HGB 13.3 11/20/2020   HCT 41.6 11/20/2020   MCV 89.5 11/20/2020   PLT 227 11/20/2020     Assessment & Plan:  Janice Taylor is a 30 y.o. female with fissure on exam in the anterior midline with  a small tag. No external hemorrhoids. Her symptoms of tearing and sharp pain with BM go along with fissure.   Discussed the pathology behind fissures and the causes. Discussed the need to relax the sphincter, role of surgery if medical therapies do not work. Discussed that any surgery for fissures carries risk of incontinence. Discussed that I see no external hemorrhoids and the bleeding she describes is minimal, H&H and platelets normal on recent labs.  Discussed Diltiazem 2%/ Lidocaine 5% ointment QID and ordered it from [place]. Discussed the cost and that the Diltiazem is the medication that helps relax the sphincter to allow for healing. Discussed Sitz baths.   Would still recommend GI see her given her IBS symptoms and rectal bleeding, although likely from fissure could be from something else. At this time there is no reason to suspect any IBD as the fissure is anterior midline and not off line but anterior is still more rare than posterior.   Will see back in 1 month. She will remain out of work until 8/22 to allow for her ointment to start working.   Future Appointments  Date Time Provider [place]  01/06/2021  9:15 AM Virl Cagey, MD [facility] None    All questions were answered to the satisfaction of the patient.   Virl Cagey 12/09/2020, 9:22 AM

## 2020-12-09 NOTE — Telephone Encounter (Signed)
Patient calling stating that the person she seen for the referral that was sent states that she didn't see anything wrong with pt . Pt is calling confused and wants to know if you would call her

## 2020-12-09 NOTE — Telephone Encounter (Signed)
Janice Taylor is feeling better and she saw  Dr Constance Haw this am and ?fissure or tear, no hemorrhoid now. New cream sent in she said

## 2020-12-10 ENCOUNTER — Telehealth: Payer: Self-pay | Admitting: Adult Health

## 2020-12-10 NOTE — Telephone Encounter (Signed)
No answer

## 2020-12-15 ENCOUNTER — Other Ambulatory Visit: Payer: Self-pay | Admitting: Obstetrics & Gynecology

## 2020-12-16 ENCOUNTER — Ambulatory Visit: Payer: Self-pay | Admitting: General Surgery

## 2021-01-06 ENCOUNTER — Ambulatory Visit (INDEPENDENT_AMBULATORY_CARE_PROVIDER_SITE_OTHER): Payer: Medicaid Other | Admitting: General Surgery

## 2021-01-06 ENCOUNTER — Other Ambulatory Visit (HOSPITAL_COMMUNITY)
Admission: RE | Admit: 2021-01-06 | Discharge: 2021-01-06 | Disposition: A | Discharge status: Home / Self Care | Payer: Medicaid Other | Source: Ambulatory Visit | Attending: Internal Medicine | Admitting: Internal Medicine

## 2021-01-06 ENCOUNTER — Other Ambulatory Visit: Payer: Self-pay

## 2021-01-06 ENCOUNTER — Encounter: Payer: Self-pay | Admitting: General Surgery

## 2021-01-06 VITALS — BP 144/79 | HR 96 | Temp 98.0°F | Resp 16 | Ht 63.0 in | Wt 369.0 lb

## 2021-01-06 DIAGNOSIS — R7301 Impaired fasting glucose: Secondary | ICD-10-CM | POA: Insufficient documentation

## 2021-01-06 DIAGNOSIS — K602 Anal fissure, unspecified: Secondary | ICD-10-CM | POA: Diagnosis not present

## 2021-01-06 LAB — HEMOGLOBIN A1C
Hgb A1c MFr Bld: 6.4 % — ABNORMAL HIGH (ref 4.8–5.6)
Mean Plasma Glucose: 136.98 mg/dL

## 2021-01-06 NOTE — Progress Notes (Signed)
[facility] Note   HPI:  30 y.o. Female presents to clinic for  follow up evaluation of her anterior anal fissure. Patient reports the ointment is helping and her rectal pain is significantly improved. She is having diarrhea still and does not think the bentyl prescribed by PCP is helping her IBS symptoms/ abdominal pain. She is otherwise doing fair.   Review of Systems:  No rectal bleeding Resolved rectal pain All other review of systems: otherwise negative   Vital Signs:  BP (!) 144/79   Pulse 96   Temp 98 F (36.7 C) (Other (Comment))   Resp 16   Ht '5\' 3"'$  (1.6 m)   Wt (!) 369 lb (167.4 kg)   SpO2 94%   BMI 65.37 kg/m    Physical Exam:  Physical Exam Constitutional:      Appearance: She is obese.  Cardiovascular:     Rate and Rhythm: Normal rate.  Pulmonary:     Effort: Pulmonary effort is normal.  Genitourinary:    Comments: Healing anterior fissure with tag, minimal tenderness Neurological:     Mental Status: She is alert.     Assessment:  30 y.o. yo Female with fissure that is healing and overall improvement in the symptoms. She still having diarrhea and abdominal pain. She had a GI referral for hemorrhoids and IBS symptoms but this has been canceled for November.   Plan:  - Referral to GI for diarrhea, IBS   - Fissure healed , Diltiazem 2%/ Lidocaine 5% QID as needed  - PRN follow up    Curlene Labrum, MD [facility] [place], [place] [id number] [phone] (office)

## 2021-01-06 NOTE — Patient Instructions (Addendum)
Will make sure Referral for IBS symptoms in for GI. You had an appt in November for hemorrhoids but this has been canceled probably by that office since you saw me  Use your ointment as needed. Keep stools regular and soft. Keep clean after BM.

## 2021-01-09 ENCOUNTER — Other Ambulatory Visit: Payer: Medicaid Other

## 2021-01-15 ENCOUNTER — Ambulatory Visit: Payer: Medicaid Other | Admitting: Advanced Practice Midwife

## 2021-01-22 ENCOUNTER — Other Ambulatory Visit: Payer: Self-pay

## 2021-01-22 ENCOUNTER — Other Ambulatory Visit (HOSPITAL_COMMUNITY)
Admission: RE | Admit: 2021-01-22 | Discharge: 2021-01-22 | Disposition: A | Discharge status: Home / Self Care | Payer: Medicaid Other | Source: Ambulatory Visit | Attending: Internal Medicine | Admitting: Internal Medicine

## 2021-01-22 DIAGNOSIS — D696 Thrombocytopenia, unspecified: Secondary | ICD-10-CM | POA: Diagnosis not present

## 2021-01-22 DIAGNOSIS — I10 Essential (primary) hypertension: Secondary | ICD-10-CM | POA: Insufficient documentation

## 2021-01-22 LAB — COMPREHENSIVE METABOLIC PANEL
ALT: 20 U/L (ref 0–44)
AST: 19 U/L (ref 15–41)
Albumin: 3.8 g/dL (ref 3.5–5.0)
Alkaline Phosphatase: 93 U/L (ref 38–126)
Anion gap: 9 (ref 5–15)
BUN: 13 mg/dL (ref 6–20)
CO2: 28 mmol/L (ref 22–32)
Calcium: 8.9 mg/dL (ref 8.9–10.3)
Chloride: 100 mmol/L (ref 98–111)
Creatinine, Ser: 0.77 mg/dL (ref 0.44–1.00)
GFR, Estimated: >60 mL/min (ref >60)
Glucose, Bld: 103 mg/dL — ABNORMAL HIGH (ref 70–99)
Potassium: 4 mmol/L (ref 3.5–5.1)
Sodium: 137 mmol/L (ref 135–145)
Total Bilirubin: 0.5 mg/dL (ref 0.3–1.2)
Total Protein: 7.6 g/dL (ref 6.5–8.1)

## 2021-01-22 LAB — CBC WITH DIFFERENTIAL/PLATELET
Abs Immature Granulocytes: 0.04 10*3/uL (ref 0.00–0.07)
Basophils Absolute: 0 10*3/uL (ref 0.0–0.1)
Basophils Relative: 0 %
Eosinophils Absolute: 0.1 10*3/uL (ref 0.0–0.5)
Eosinophils Relative: 1 %
HCT: 39.8 % (ref 36.0–46.0)
Hemoglobin: 12.9 g/dL (ref 12.0–15.0)
Immature Granulocytes: 0 %
Lymphocytes Relative: 21 %
Lymphs Abs: 2.2 10*3/uL (ref 0.7–4.0)
MCH: 28.5 pg (ref 26.0–34.0)
MCHC: 32.4 g/dL (ref 30.0–36.0)
MCV: 87.9 fL (ref 80.0–100.0)
Monocytes Absolute: 0.5 10*3/uL (ref 0.1–1.0)
Monocytes Relative: 5 %
Neutro Abs: 7.7 10*3/uL (ref 1.7–7.7)
Neutrophils Relative %: 73 %
Platelets: 241 10*3/uL (ref 150–400)
RBC: 4.53 MIL/uL (ref 3.87–5.11)
RDW: 14 % (ref 11.5–15.5)
WBC: 10.6 10*3/uL — ABNORMAL HIGH (ref 4.0–10.5)
nRBC: 0 % (ref 0.0–0.2)

## 2021-01-24 ENCOUNTER — Encounter (HOSPITAL_COMMUNITY): Payer: Self-pay | Admitting: Emergency Medicine

## 2021-01-24 ENCOUNTER — Emergency Department (HOSPITAL_COMMUNITY)
Admission: EM | Admit: 2021-01-24 | Discharge: 2021-01-25 | Disposition: A | Discharge status: Home / Self Care | Payer: Medicaid Other | Attending: Emergency Medicine | Admitting: Emergency Medicine

## 2021-01-24 ENCOUNTER — Other Ambulatory Visit: Payer: Self-pay

## 2021-01-24 DIAGNOSIS — R1011 Right upper quadrant pain: Secondary | ICD-10-CM | POA: Insufficient documentation

## 2021-01-24 DIAGNOSIS — R52 Pain, unspecified: Secondary | ICD-10-CM

## 2021-01-24 DIAGNOSIS — I10 Essential (primary) hypertension: Secondary | ICD-10-CM | POA: Diagnosis not present

## 2021-01-24 DIAGNOSIS — R110 Nausea: Secondary | ICD-10-CM | POA: Diagnosis not present

## 2021-01-24 LAB — URINALYSIS, ROUTINE W REFLEX MICROSCOPIC
Bacteria, UA: NONE SEEN
Bilirubin Urine: NEGATIVE
Glucose, UA: NEGATIVE mg/dL
Ketones, ur: NEGATIVE mg/dL
Nitrite: NEGATIVE
Protein, ur: 100 mg/dL — AB
Specific Gravity, Urine: 1.021 (ref 1.005–1.030)
pH: 5 (ref 5.0–8.0)

## 2021-01-24 LAB — CBC
HCT: 39.5 % (ref 36.0–46.0)
Hemoglobin: 12.2 g/dL (ref 12.0–15.0)
MCH: 27.7 pg (ref 26.0–34.0)
MCHC: 30.9 g/dL (ref 30.0–36.0)
MCV: 89.8 fL (ref 80.0–100.0)
Platelets: 219 10*3/uL (ref 150–400)
RBC: 4.4 MIL/uL (ref 3.87–5.11)
RDW: 13.6 % (ref 11.5–15.5)
WBC: 7.5 10*3/uL (ref 4.0–10.5)
nRBC: 0 % (ref 0.0–0.2)

## 2021-01-24 LAB — COMPREHENSIVE METABOLIC PANEL
ALT: 21 U/L (ref 0–44)
AST: 19 U/L (ref 15–41)
Albumin: 3.6 g/dL (ref 3.5–5.0)
Alkaline Phosphatase: 84 U/L (ref 38–126)
Anion gap: 8 (ref 5–15)
BUN: 15 mg/dL (ref 6–20)
CO2: 26 mmol/L (ref 22–32)
Calcium: 8.2 mg/dL — ABNORMAL LOW (ref 8.9–10.3)
Chloride: 100 mmol/L (ref 98–111)
Creatinine, Ser: 0.84 mg/dL (ref 0.44–1.00)
GFR, Estimated: >60 mL/min (ref >60)
Glucose, Bld: 126 mg/dL — ABNORMAL HIGH (ref 70–99)
Potassium: 3.8 mmol/L (ref 3.5–5.1)
Sodium: 134 mmol/L — ABNORMAL LOW (ref 135–145)
Total Bilirubin: 0.5 mg/dL (ref 0.3–1.2)
Total Protein: 7.2 g/dL (ref 6.5–8.1)

## 2021-01-24 LAB — LIPASE, BLOOD: Lipase: 24 U/L (ref 11–51)

## 2021-01-24 LAB — PREGNANCY, URINE: Preg Test, Ur: NEGATIVE

## 2021-01-24 MED ORDER — PROCHLORPERAZINE EDISYLATE 10 MG/2ML IJ SOLN
10.0000 mg | Freq: Once | INTRAMUSCULAR | Status: AC
  Administered 2021-01-25: 10 mg via INTRAVENOUS
  Filled 2021-01-24: qty 2

## 2021-01-24 MED ORDER — FENTANYL CITRATE PF 50 MCG/ML IJ SOSY
50.0000 ug | PREFILLED_SYRINGE | Freq: Once | INTRAMUSCULAR | Status: AC
Start: 2021-01-25 — End: 2021-01-25
  Administered 2021-01-25: 50 ug via INTRAVENOUS
  Filled 2021-01-24: qty 1

## 2021-01-24 MED ORDER — KETOROLAC TROMETHAMINE 15 MG/ML IJ SOLN
15.0000 mg | Freq: Once | INTRAMUSCULAR | Status: AC
  Administered 2021-01-25: 15 mg via INTRAVENOUS
  Filled 2021-01-24: qty 1

## 2021-01-24 NOTE — ED Triage Notes (Addendum)
Patient c/o right upper abd pain that started yesterday with nausea and vomiting. Patient also states some swelling to right side of abd. Denies any diarrhea. Unsure of any fevers. Per patient decrease in urination. Patient currently taking antibiotic and oxycodone for abscessed wisdom tooth. Per patient oxycodone not helping with pain. Last BM today-normal-no blood noted.

## 2021-01-24 NOTE — ED Notes (Signed)
Pt requested an Ice Pack to place on her forehead for minor headache. Pt provided Ice Pack.

## 2021-01-25 ENCOUNTER — Emergency Department (HOSPITAL_COMMUNITY): Payer: Medicaid Other

## 2021-01-25 MED ORDER — OXYCODONE HCL 5 MG PO TABS: 5.0000 mg | ORAL_TABLET | ORAL | 0 refills | Status: DC | PRN

## 2021-01-25 MED ORDER — IOHEXOL 300 MG/ML  SOLN
100.0000 mL | Freq: Once | INTRAMUSCULAR | Status: AC | PRN
  Administered 2021-01-25: 100 mL via INTRAVENOUS

## 2021-01-25 NOTE — ED Notes (Signed)
Patient transported to CT 

## 2021-01-25 NOTE — ED Provider Notes (Signed)
[facility] Emergency Department Provider Note MRN:  [id number]  Arrival date & time: 01/25/21     Chief Complaint   Abdominal Pain   History of Present Illness   Janice Taylor is a 30 y.o. year-old female with a history of ITP, hypertension presenting to the ED with chief complaint of abdominal pain.  Location: Right upper quadrant Duration: 2 or 3 days Onset: Sudden Timing: Intermittent Description: Sharp Severity: Severe Exacerbating/Alleviating Factors: Worse with meal Associated Symptoms: Nausea Pertinent Negatives: Denies fever, no chest pain or shortness of breath, no lower abdominal pain  Additional History: On antibiotics for tooth abscess currently.  Review of Systems  A complete 10 system review of systems was obtained and all systems are negative except as noted in the HPI and PMH.   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   Consistent with ITP    Leg swelling    Obesity 01/05/2013   Thrombocytopenia ([place]) 11/25/2011   Consistent with ITP    Past Surgical History:  Procedure Laterality Date   CESAREAN SECTION N/A 12/18/2018   Procedure: CESAREAN SECTION;  Surgeon: Woodroe Mode, MD;  Location: [facility] LD ORS;  Service: Obstetrics;  Laterality: N/A;   TONSILECTOMY, ADENOIDECTOMY, BILATERAL MYRINGOTOMY AND TUBES      Family History  Problem Relation Age of Onset   Diabetes Maternal Uncle    Kidney disease Maternal Uncle        renal failure/dialysis   Heart attack Maternal Grandmother     Social History   Socioeconomic History   Marital status: Single    Spouse name: Not on file   Number of children: 2   Years of education: Not on file   Highest education level: Not on file  Occupational History    Occupation: Nurse tech    Comment: [employer]  Tobacco Use   Smoking status: Never   Smokeless tobacco: Never  Vaping Use   Vaping Use: Never used  Substance and Sexual Activity   Alcohol use: No   Drug use: No   Sexual activity: Yes    Birth control/protection: I.U.D.  Other Topics Concern   Not on file  Social History Narrative   Not on file   Social Determinants of Health   Financial Resource Strain: Not on file  Food Insecurity: Not on file  Transportation Needs: Not on file  Physical Activity: Not on file  Stress: Not on file  Social Connections: Not on file  Intimate Partner Violence: Not on file     Physical Exam   Vitals:   01/25/21 0044 01/25/21 0100  BP: 139/68 (!) 142/97  Pulse: 84 90  Resp: 18   Temp:    SpO2: 93% 90%    CONSTITUTIONAL: Well-appearing, NAD NEURO:  Alert and oriented x 3, no focal deficits EYES:  eyes equal and reactive ENT/NECK:  no LAD, no JVD CARDIO: Regular rate, well-perfused, normal S1 and S2 PULM:  CTAB no wheezing or rhonchi GI/GU:  normal bowel sounds, non-distended, mild right upper quadrant tenderness to palpation MSK/SPINE:  No gross deformities, no edema SKIN:  no rash, atraumatic PSYCH:  Appropriate speech and behavior  *Additional and/or pertinent findings included in MDM below  Diagnostic and Interventional Summary    EKG Interpretation  Date/Time:    Ventricular Rate:    PR Interval:    QRS Duration:   QT Interval:    QTC Calculation:   R Axis:     Text Interpretation:         Labs Reviewed  COMPREHENSIVE METABOLIC PANEL - Abnormal; Notable for the following components:      Result Value   Sodium 134 (*)    Glucose, Bld 126 (*)    Calcium 8.2 (*)    All other components within normal limits  URINALYSIS, ROUTINE W REFLEX MICROSCOPIC - Abnormal; Notable for the following components:   APPearance HAZY (*)    Hgb urine dipstick MODERATE (*)    Protein, ur 100 (*)    Leukocytes,Ua TRACE (*)     All other components within normal limits  LIPASE, BLOOD  CBC  PREGNANCY, URINE  POC URINE PREG, ED    CT ABDOMEN PELVIS W CONTRAST  Final Result    US Abdomen Limited RUQ/Gall Gladder    (Results Pending)    Medications  ketorolac (TORADOL) 15 MG/ML injection 15 mg (15 mg Intravenous Given 01/25/21 0014)  prochlorperazine (COMPAZINE) injection 10 mg (10 mg Intravenous Given 01/25/21 0014)  fentaNYL (SUBLIMAZE) injection 50 mcg (50 mcg Intravenous Given 01/25/21 0015)  iohexol (OMNIPAQUE) 300 MG/ML solution 100 mL (100 mLs Intravenous Contrast Given 01/25/21 0037)     Procedures  /  Critical Care Procedures  ED Course and Medical Decision Making  I have reviewed the triage vital signs, the nursing notes, and pertinent available records from the EMR.  Listed above are laboratory and imaging tests that I personally ordered, reviewed, and interpreted and then considered in my medical decision making (see below for details).  History suspicious for biliary colic versus less likely cholecystitis.  Also considering gastritis, peptic ulcer disease, pancreatitis.  Labs reassuring, awaiting CT.     CT scan is reassuring.  Still favoring biliary colic.  Patient feeling much better and requesting discharge.  Will order ultrasound, patient requests that it be ordered for Monday rather than tomorrow.  Strict return precautions, appropriate for discharge.  Barth Kirks. [place] Small, [place] [email or web address]  Final Clinical Impressions(s) / ED Diagnoses     ICD-10-CM   1. Right upper quadrant abdominal pain  R10.11       ED Discharge Orders          Ordered    US Abdomen Limited RUQ/Gall Gladder        01/25/21 0151    oxyCODONE (ROXICODONE) 5 MG immediate release tablet  Every 4 hours PRN        01/25/21 0152             Discharge Instructions Discussed with and Provided to Patient:     Discharge Instructions      You  were evaluated in the Emergency Department and after careful evaluation, we did not find any emergent condition requiring admission or further testing in the hospital.  Your exam/testing today was overall reassuring.  CT scan was reassuring.  Your pain may be due to a stone in your gallbladder.  Please return for your ultrasound on Monday.  Recommend Tylenol 1000 mg every 4-6 hours and/or Motrin 600 mg every 4-6 hours for pain.  You can use the oxycodone medication for more significant pain.  Please return to the Emergency Department if you experience any worsening of your  condition such as worsening pain or fever.  Thank you for allowing [place] to be a part of your care.         Maudie Flakes, MD 01/25/21 [phone]

## 2021-01-25 NOTE — Discharge Instructions (Addendum)
You were evaluated in the Emergency Department and after careful evaluation, we did not find any emergent condition requiring admission or further testing in the hospital.  Your exam/testing today was overall reassuring.  CT scan was reassuring.  Your pain may be due to a stone in your gallbladder.  Please return for your ultrasound on Monday.  Recommend Tylenol 1000 mg every 4-6 hours and/or Motrin 600 mg every 4-6 hours for pain.  You can use the oxycodone medication for more significant pain.  Please return to the Emergency Department if you experience any worsening of your condition such as worsening pain or fever.  Thank you for allowing [place] to be a part of your care.

## 2021-01-26 ENCOUNTER — Other Ambulatory Visit: Payer: Self-pay

## 2021-01-26 ENCOUNTER — Ambulatory Visit (HOSPITAL_COMMUNITY)
Admission: RE | Admit: 2021-01-26 | Discharge: 2021-01-26 | Disposition: A | Discharge status: Home / Self Care | Payer: Medicaid Other | Source: Ambulatory Visit | Attending: Emergency Medicine | Admitting: Emergency Medicine

## 2021-01-26 DIAGNOSIS — R52 Pain, unspecified: Secondary | ICD-10-CM | POA: Diagnosis present

## 2021-02-22 ENCOUNTER — Encounter: Payer: Self-pay | Admitting: Emergency Medicine

## 2021-02-22 ENCOUNTER — Other Ambulatory Visit: Payer: Self-pay

## 2021-02-22 ENCOUNTER — Ambulatory Visit
Admission: EM | Admit: 2021-02-22 | Discharge: 2021-02-22 | Disposition: A | Discharge status: Home / Self Care | Payer: Medicaid Other

## 2021-02-22 DIAGNOSIS — K0889 Other specified disorders of teeth and supporting structures: Secondary | ICD-10-CM

## 2021-02-22 DIAGNOSIS — K08409 Partial loss of teeth, unspecified cause, unspecified class: Secondary | ICD-10-CM

## 2021-02-22 DIAGNOSIS — R519 Headache, unspecified: Secondary | ICD-10-CM | POA: Diagnosis not present

## 2021-02-22 MED ORDER — HYDROCODONE-ACETAMINOPHEN 5-325 MG PO TABS: 1.0000 | ORAL_TABLET | Freq: Four times a day (QID) | ORAL | 0 refills | Status: AC | PRN

## 2021-02-22 MED ORDER — KETOROLAC TROMETHAMINE 60 MG/2ML IM SOLN
60.0000 mg | Freq: Once | INTRAMUSCULAR | Status: AC
Start: 2021-02-22 — End: 2021-02-22
  Administered 2021-02-22: 60 mg via INTRAMUSCULAR

## 2021-02-22 NOTE — ED Triage Notes (Signed)
Had wisdom teeth taken out on Wednesday.  Left side of face is swollen.  Pain to that left side of face.  Has been applying ice to area.

## 2021-02-22 NOTE — Discharge Instructions (Addendum)
Please make sure you follow-up with your dental surgeon as soon as possible.  In the meantime I provided you with a very short-term supply of hydrocodone to help you with your severe pain.  At this time I do not see a sign of a medical emergency warranting an ER visit from your exam.  However.  If you develop spontaneous drainage of pus or bleeding, high fevers not controlled by the use of Tylenol and ibuprofen then please report to the emergency room and do not wait to be seen by your oral surgeon.

## 2021-02-22 NOTE — ED Provider Notes (Signed)
[place]   MRN: [id number] DOB: 04-16-1991  Subjective:   Janice Taylor is a 30 y.o. female presenting for ongoing severe dental and facial pain with swelling.  Patient had wisdom tooth extraction 4 days ago.  She has used the hydrocodone that was prescribed to her for severe pain.  She is also on amoxicillin.  She plans on seeing her dental specialist as soon as possible.  No current facility-administered medications for this encounter.  Current Outpatient Medications:    amoxicillin (AMOXIL) 500 MG tablet, Take 500 mg by mouth 2 (two) times daily., Disp: , Rfl:    ibuprofen (ADVIL) 800 MG tablet, Take 800 mg by mouth every 8 (eight) hours as needed., Disp: , Rfl:    ALPRAZolam (XANAX) 1 MG tablet, Take 1 mg by mouth 3 (three) times daily as needed., Disp: , Rfl:    amLODipine (NORVASC) 10 MG tablet, Take 10 mg by mouth daily., Disp: , Rfl:    butalbital-acetaminophen-caffeine (FIORICET) 50-325-40 MG tablet, Take 1-2 tablets by mouth every 6 (six) hours as needed for headache., Disp: 20 tablet, Rfl: 0   cyclobenzaprine (FLEXERIL) 10 MG tablet, TAKE 1 TABLET EVERY 8 HOURS AS NEEDED FOR MUSCLE SPASMS., Disp: 30 tablet, Rfl: 0   dicyclomine (BENTYL) 20 MG tablet, Take 20 mg by mouth 4 (four) times daily., Disp: , Rfl:    escitalopram (LEXAPRO) 10 MG tablet, , Disp: , Rfl:    hydrochlorothiazide (HYDRODIURIL) 25 MG tablet, Take 25 mg by mouth daily., Disp: , Rfl:    hydrocortisone (ANUSOL-HC) 25 MG suppository, Place 1 suppository (25 mg total) rectally 2 (two) times daily., Disp: 12 suppository, Rfl: 0   Levonorgestrel (KYLEENA IU), by Intrauterine route., Disp: , Rfl:    lidocaine (XYLOCAINE) 5 % ointment, Apply topically 4 (four) times daily as needed., Disp: 35.44 g, Rfl: 1   metFORMIN (GLUCOPHAGE) 500 MG tablet, Take 500 mg by mouth daily., Disp: , Rfl:    oxyCODONE (ROXICODONE) 5 MG immediate release tablet, Take 1 tablet (5 mg total) by mouth every 4 (four) hours as  needed for severe pain., Disp: 5 tablet, Rfl: 0   pramoxine-hydrocortisone (PROCTOCREAM-HC) 1-1 % rectal cream, Place 1 application rectally 2 (two) times daily., Disp: 30 g, Rfl: 1   valACYclovir (VALTREX) 1000 MG tablet, Take 1 tablet (1,000 mg total) by mouth daily., Disp: 10 tablet, Rfl: 11   zolpidem (AMBIEN) 10 MG tablet, Take 1 tablet (10 mg total) by mouth at bedtime., Disp: 75 tablet, Rfl: 0   Allergies  Allergen Reactions   Ondansetron Hcl Hives    Not effective` Not effective` Not effective` Not effective` Not effective` Not effective`    Fentanyl     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   Consistent with ITP    Leg swelling    Obesity 01/05/2013   Thrombocytopenia ([place]) 11/25/2011   Consistent with ITP     Past Surgical History:  Procedure Laterality Date   CESAREAN SECTION N/A 12/18/2018   Procedure: CESAREAN SECTION;  Surgeon: Woodroe Mode, MD;  Location: [facility] LD ORS;  Service: Obstetrics;  Laterality: N/A;   TONSILECTOMY, ADENOIDECTOMY, BILATERAL MYRINGOTOMY AND TUBES      Family History  Problem Relation Age of Onset   Diabetes  Maternal Uncle    Kidney disease Maternal Uncle        renal failure/dialysis   Heart attack Maternal Grandmother     Social History   Tobacco Use   Smoking status: Never   Smokeless tobacco: Never  Vaping Use   Vaping Use: Never used  Substance Use Topics   Alcohol use: No   Drug use: No    ROS   Objective:   Vitals: BP (!) 167/106 (BP Location: Right Arm)   Pulse (!) 110   Temp 98.2 F (36.8 C) (Oral)   Resp 18   SpO2 94%   Physical Exam Constitutional:      General: She is not in acute distress.    Appearance: Normal appearance. She is well-developed. She is obese. She is not ill-appearing, toxic-appearing  or diaphoretic.  HENT:     Head: Normocephalic and atraumatic.      Right Ear: External ear normal.     Left Ear: External ear normal.     Nose: Nose normal.     Mouth/Throat:     Mouth: Mucous membranes are moist.     Pharynx: No oropharyngeal exudate or posterior oropharyngeal erythema.   Eyes:     General: No scleral icterus.       Right eye: No discharge.        Left eye: No discharge.     Extraocular Movements: Extraocular movements intact.     Conjunctiva/sclera: Conjunctivae normal.     Pupils: Pupils are equal, round, and reactive to light.  Cardiovascular:     Rate and Rhythm: Normal rate.  Pulmonary:     Effort: Pulmonary effort is normal.  Skin:    General: Skin is warm and dry.  Neurological:     General: No focal deficit present.     Mental Status: She is alert and oriented to person, place, and time.  Psychiatric:        Mood and Affect: Mood normal.        Behavior: Behavior normal.    Assessment and Plan :   I have reviewed the PDMP during this encounter.  1. Facial pain   2. Pain, dental   3. History of third molar tooth extraction, unspecified edentulism class    IM Toradol in clinic.  Provide her with a short-term supply for hydrocodone for her severe pain.  Emphasized need to follow-up with her dental surgeon as soon as possible.  Maintain strict ER precautions. Counseled patient on potential for adverse effects with medications prescribed today, patient verbalized understanding.    Jaynee Eagles, [place] 02/22/21 [id number]

## 2021-03-18 ENCOUNTER — Ambulatory Visit: Payer: Medicaid Other | Admitting: Gastroenterology

## 2021-03-18 ENCOUNTER — Ambulatory Visit: Payer: Medicaid Other | Admitting: Adult Health

## 2021-03-31 ENCOUNTER — Other Ambulatory Visit: Payer: Self-pay | Admitting: Obstetrics & Gynecology

## 2021-04-01 ENCOUNTER — Ambulatory Visit: Payer: Medicaid Other | Admitting: Advanced Practice Midwife

## 2021-04-07 ENCOUNTER — Ambulatory Visit: Payer: Medicaid Other | Admitting: Adult Health

## 2021-04-17 ENCOUNTER — Emergency Department (HOSPITAL_COMMUNITY): Payer: Medicaid Other

## 2021-04-17 ENCOUNTER — Other Ambulatory Visit: Payer: Self-pay

## 2021-04-17 ENCOUNTER — Emergency Department (HOSPITAL_COMMUNITY)
Admission: EM | Admit: 2021-04-17 | Discharge: 2021-04-17 | Disposition: A | Discharge status: Home / Self Care | Payer: Medicaid Other | Attending: Emergency Medicine | Admitting: Emergency Medicine

## 2021-04-17 ENCOUNTER — Encounter (HOSPITAL_COMMUNITY): Payer: Self-pay

## 2021-04-17 DIAGNOSIS — R109 Unspecified abdominal pain: Secondary | ICD-10-CM

## 2021-04-17 DIAGNOSIS — R739 Hyperglycemia, unspecified: Secondary | ICD-10-CM | POA: Diagnosis not present

## 2021-04-17 DIAGNOSIS — R000 Tachycardia, unspecified: Secondary | ICD-10-CM | POA: Diagnosis not present

## 2021-04-17 DIAGNOSIS — R1011 Right upper quadrant pain: Secondary | ICD-10-CM | POA: Insufficient documentation

## 2021-04-17 DIAGNOSIS — E860 Dehydration: Secondary | ICD-10-CM | POA: Diagnosis not present

## 2021-04-17 DIAGNOSIS — R112 Nausea with vomiting, unspecified: Secondary | ICD-10-CM | POA: Diagnosis not present

## 2021-04-17 DIAGNOSIS — I10 Essential (primary) hypertension: Secondary | ICD-10-CM | POA: Diagnosis not present

## 2021-04-17 DIAGNOSIS — Z79899 Other long term (current) drug therapy: Secondary | ICD-10-CM | POA: Insufficient documentation

## 2021-04-17 LAB — CBC WITH DIFFERENTIAL/PLATELET
Abs Immature Granulocytes: 0.05 10*3/uL (ref 0.00–0.07)
Basophils Absolute: 0.1 10*3/uL (ref 0.0–0.1)
Basophils Relative: 1 %
Eosinophils Absolute: 0.1 10*3/uL (ref 0.0–0.5)
Eosinophils Relative: 1 %
HCT: 37.8 % (ref 36.0–46.0)
Hemoglobin: 12.1 g/dL (ref 12.0–15.0)
Immature Granulocytes: 1 %
Lymphocytes Relative: 25 %
Lymphs Abs: 2.4 10*3/uL (ref 0.7–4.0)
MCH: 27.7 pg (ref 26.0–34.0)
MCHC: 32 g/dL (ref 30.0–36.0)
MCV: 86.5 fL (ref 80.0–100.0)
Monocytes Absolute: 0.5 10*3/uL (ref 0.1–1.0)
Monocytes Relative: 6 %
Neutro Abs: 6.4 10*3/uL (ref 1.7–7.7)
Neutrophils Relative %: 66 %
Platelets: 247 10*3/uL (ref 150–400)
RBC: 4.37 MIL/uL (ref 3.87–5.11)
RDW: 13.1 % (ref 11.5–15.5)
WBC: 9.5 10*3/uL (ref 4.0–10.5)
nRBC: 0 % (ref 0.0–0.2)

## 2021-04-17 LAB — COMPREHENSIVE METABOLIC PANEL
ALT: 19 U/L (ref 0–44)
AST: 16 U/L (ref 15–41)
Albumin: 3.7 g/dL (ref 3.5–5.0)
Alkaline Phosphatase: 96 U/L (ref 38–126)
Anion gap: 11 (ref 5–15)
BUN: 17 mg/dL (ref 6–20)
CO2: 25 mmol/L (ref 22–32)
Calcium: 8.8 mg/dL — ABNORMAL LOW (ref 8.9–10.3)
Chloride: 96 mmol/L — ABNORMAL LOW (ref 98–111)
Creatinine, Ser: 0.77 mg/dL (ref 0.44–1.00)
GFR, Estimated: >60 mL/min (ref >60)
Glucose, Bld: 330 mg/dL — ABNORMAL HIGH (ref 70–99)
Potassium: 3.3 mmol/L — ABNORMAL LOW (ref 3.5–5.1)
Sodium: 132 mmol/L — ABNORMAL LOW (ref 135–145)
Total Bilirubin: 0.2 mg/dL — ABNORMAL LOW (ref 0.3–1.2)
Total Protein: 7 g/dL (ref 6.5–8.1)

## 2021-04-17 LAB — URINALYSIS, ROUTINE W REFLEX MICROSCOPIC
Bilirubin Urine: NEGATIVE
Glucose, UA: >=500 mg/dL — AB
Ketones, ur: NEGATIVE mg/dL
Leukocytes,Ua: NEGATIVE
Nitrite: NEGATIVE
Specific Gravity, Urine: 1.025 (ref 1.005–1.030)
pH: 6 (ref 5.0–8.0)

## 2021-04-17 LAB — URINALYSIS, MICROSCOPIC (REFLEX)

## 2021-04-17 LAB — PREGNANCY, URINE: Preg Test, Ur: NEGATIVE

## 2021-04-17 LAB — HEMOGLOBIN A1C
Hgb A1c MFr Bld: 7 % — ABNORMAL HIGH (ref 4.8–5.6)
Mean Plasma Glucose: 154.2 mg/dL

## 2021-04-17 LAB — LIPASE, BLOOD: Lipase: 25 U/L (ref 11–51)

## 2021-04-17 MED ORDER — SODIUM CHLORIDE 0.9 % IV SOLN
12.5000 mg | Freq: Once | INTRAVENOUS | Status: AC
  Administered 2021-04-17: 08:00:00 12.5 mg via INTRAVENOUS
  Filled 2021-04-17: qty 0.5

## 2021-04-17 MED ORDER — SODIUM CHLORIDE 0.9 % IV BOLUS
1000.0000 mL | Freq: Once | INTRAVENOUS | Status: AC
  Administered 2021-04-17: 08:00:00 1000 mL via INTRAVENOUS

## 2021-04-17 MED ORDER — PROMETHAZINE HCL 25 MG PO TABS: 25.0000 mg | ORAL_TABLET | Freq: Four times a day (QID) | ORAL | 0 refills | Status: DC | PRN

## 2021-04-17 MED ORDER — HYDROCODONE-ACETAMINOPHEN 5-325 MG PO TABS
1.0000 | ORAL_TABLET | Freq: Once | ORAL | Status: AC
  Administered 2021-04-17: 11:00:00 1 via ORAL
  Filled 2021-04-17: qty 1

## 2021-04-17 MED ORDER — PROMETHAZINE HCL 12.5 MG PO TABS
25.0000 mg | ORAL_TABLET | Freq: Once | ORAL | Status: AC
  Administered 2021-04-17: 12:00:00 25 mg via ORAL
  Filled 2021-04-17: qty 2

## 2021-04-17 MED ORDER — HYDROCODONE-ACETAMINOPHEN 5-325 MG PO TABS: 1.0000 | ORAL_TABLET | ORAL | 0 refills | Status: DC | PRN

## 2021-04-17 MED ORDER — METFORMIN HCL 500 MG PO TABS: 500.0000 mg | ORAL_TABLET | Freq: Two times a day (BID) | ORAL | 0 refills | Status: DC

## 2021-04-17 MED ORDER — KETOROLAC TROMETHAMINE 30 MG/ML IJ SOLN
30.0000 mg | Freq: Once | INTRAMUSCULAR | Status: AC
  Administered 2021-04-17: 11:00:00 30 mg via INTRAVENOUS
  Filled 2021-04-17: qty 1

## 2021-04-17 MED ORDER — MORPHINE SULFATE (PF) 4 MG/ML IV SOLN
4.0000 mg | Freq: Once | INTRAVENOUS | Status: AC
  Administered 2021-04-17: 08:00:00 4 mg via INTRAVENOUS
  Filled 2021-04-17: qty 1

## 2021-04-17 NOTE — ED Triage Notes (Signed)
Pt states that she has vomited all day yesterday accompanied with RUQ pain 10 /10. Pt states that she is having to strain to get any urine out.

## 2021-04-17 NOTE — ED Triage Notes (Signed)
Pt states that she was COVID positive Friday from home test and PCP started her on plaxlovid

## 2021-04-17 NOTE — Discharge Instructions (Signed)
Increase metformin to 500 mg twice a day.  Stop Paxlovid and Azithromycin.

## 2021-04-17 NOTE — ED Provider Notes (Signed)
[facility] EMERGENCY DEPARTMENT Provider Note   CSN: [id number] Arrival date & time: 04/17/21  1660     History Chief Complaint  Patient presents with   Abdominal Pain    Janice Taylor is a 30 y.o. female.  Pt presents to the ED today with RUQ abd pain and n/v.  The pt said she was diagnosed with Covid a week ago.  Her pcp put her on Paxlovid and zithromax which were started yesterday.  Covid sx are gone, but she started vomiting yesterday and also had RUQ pain.  She has not had any fevers.  She feels like she has to urinate, but nothing comes out.         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   Consistent with ITP    Leg swelling    Obesity 01/05/2013   Thrombocytopenia ([place]) 11/25/2011   Consistent with ITP    Patient Active Problem List   Diagnosis Date Noted   Anterior anal fissure 12/09/2020   Positive fecal occult blood test 12/03/2020   Boil of groin 03/31/2020   Hemorrhoids 03/31/2020   Hypertension 03/31/2020   Encounter for IUD insertion 01/22/2019   Anxiety 11/07/2018   History of ITP 11/25/2011    Past Surgical History:  Procedure Laterality Date   CESAREAN SECTION N/A 12/18/2018   Procedure: CESAREAN SECTION;  Surgeon: Woodroe Mode, MD;  Location: [facility] LD ORS;  Service: Obstetrics;  Laterality: N/A;   TONSILECTOMY, ADENOIDECTOMY, BILATERAL MYRINGOTOMY AND TUBES       OB History     Gravida  3   Para  2   Term  1   Preterm  1   AB  1   Living  2      SAB  1   IAB      Ectopic      Multiple  0   Live Births  2           Family History  Problem Relation Age of Onset   Diabetes Maternal Uncle    Kidney disease Maternal Uncle        renal failure/dialysis   Heart attack Maternal Grandmother     Social History   Tobacco  Use   Smoking status: Never   Smokeless tobacco: Never  Vaping Use   Vaping Use: Never used  Substance Use Topics   Alcohol use: No   Drug use: No    Home Medications Prior to Admission medications   Medication Sig Start Date End Date Taking? Authorizing Provider  HYDROcodone-acetaminophen (NORCO/VICODIN) 5-325 MG tablet Take 1 tablet by mouth every 4 (four) hours as needed. 04/17/21  Yes Isla Pence, MD  promethazine (PHENERGAN) 25 MG tablet Take 1 tablet (25 mg total) by mouth every 6 (six) hours as needed for nausea or vomiting. 04/17/21  Yes Isla Pence, MD  ALPRAZolam Duanne Moron) 1 MG tablet Take 1 mg by mouth 3 (three) times daily as needed. 11/21/20   [provider]  amLODipine (NORVASC) 10 MG tablet Take 10 mg by mouth daily. 02/14/20   [provider]  amoxicillin (AMOXIL) 500 MG tablet Take 500 mg by mouth 2 (two) times daily.    [provider]  cyclobenzaprine (FLEXERIL) 10 MG tablet TAKE  1 TABLET EVERY 8 HOURS AS NEEDED FOR MUSCLE SPASMS. 04/01/21   Florian Buff, MD  dicyclomine (BENTYL) 20 MG tablet Take 20 mg by mouth 4 (four) times daily. 11/20/20   [provider]  escitalopram (LEXAPRO) 10 MG tablet  09/17/20   [provider]  hydrochlorothiazide (HYDRODIURIL) 25 MG tablet Take 25 mg by mouth daily.    [provider]  hydrocortisone (ANUSOL-HC) 25 MG suppository Place 1 suppository (25 mg total) rectally 2 (two) times daily. 12/03/20   Estill Dooms, NP  ibuprofen (ADVIL) 800 MG tablet Take 800 mg by mouth every 8 (eight) hours as needed.    [provider]  Levonorgestrel (KYLEENA IU) by Intrauterine route.    [provider]  lidocaine (XYLOCAINE) 5 % ointment Apply topically 4 (four) times daily as needed. 12/03/20   Estill Dooms, NP  metFORMIN (GLUCOPHAGE) 500 MG tablet Take 1 tablet (500 mg total) by mouth 2 (two) times daily with a meal. 04/17/21   Isla Pence, MD  oxyCODONE  (ROXICODONE) 5 MG immediate release tablet Take 1 tablet (5 mg total) by mouth every 4 (four) hours as needed for severe pain. 01/25/21   Maudie Flakes, MD  pramoxine-hydrocortisone (PROCTOCREAM-HC) 1-1 % rectal cream Place 1 application rectally 2 (two) times daily. 12/03/20   Estill Dooms, NP  valACYclovir (VALTREX) 1000 MG tablet Take 1 tablet (1,000 mg total) by mouth daily. 08/15/20   Florian Buff, MD  zolpidem (AMBIEN) 10 MG tablet Take 1 tablet (10 mg total) by mouth at bedtime. 07/21/20   Cory Munch, PA-C    Allergies    Ondansetron hcl and Fentanyl  Review of Systems   Review of Systems  Gastrointestinal:  Positive for abdominal pain, nausea and vomiting.  All other systems reviewed and are negative.  Physical Exam Updated Vital Signs BP (!) 169/98    Pulse (!) 103    Temp 98 F (36.7 C) (Oral)    Resp 20    LMP  (LMP Unknown)    SpO2 94%   Physical Exam Vitals and nursing note reviewed.  Constitutional:      Appearance: She is well-developed. She is obese.  HENT:     Head: Normocephalic and atraumatic.     Mouth/Throat:     Mouth: Mucous membranes are dry.  Eyes:     Extraocular Movements: Extraocular movements intact.     Pupils: Pupils are equal, round, and reactive to light.  Cardiovascular:     Rate and Rhythm: Regular rhythm. Tachycardia present.  Pulmonary:     Effort: Pulmonary effort is normal.     Breath sounds: Normal breath sounds.  Abdominal:     General: Abdomen is flat. Bowel sounds are normal.     Palpations: Abdomen is soft.     Tenderness: There is abdominal tenderness in the right upper quadrant.  Skin:    General: Skin is warm.     Capillary Refill: Capillary refill takes less than 2 seconds.  Neurological:     General: No focal deficit present.     Mental Status: She is alert and oriented to person, place, and time.  Psychiatric:        Mood and Affect: Mood normal.        Behavior: Behavior normal.    ED Results /  Procedures / Treatments   Labs (all labs ordered are listed, but only abnormal results are displayed) Labs Reviewed  COMPREHENSIVE METABOLIC PANEL -  Abnormal; Notable for the following components:      Result Value   Sodium 132 (*)    Potassium 3.3 (*)    Chloride 96 (*)    Glucose, Bld 330 (*)    Calcium 8.8 (*)    Total Bilirubin 0.2 (*)    All other components within normal limits  URINALYSIS, ROUTINE W REFLEX MICROSCOPIC - Abnormal; Notable for the following components:   Glucose, UA >=500 (*)    Hgb urine dipstick SMALL (*)    Protein, ur TRACE (*)    All other components within normal limits  URINALYSIS, MICROSCOPIC (REFLEX) - Abnormal; Notable for the following components:   Bacteria, UA MANY (*)    All other components within normal limits  CBC WITH DIFFERENTIAL/PLATELET  LIPASE, BLOOD  PREGNANCY, URINE  HEMOGLOBIN A1C    EKG None  Radiology DG Chest Portable 1 View  Result Date: 04/17/2021 CLINICAL DATA:  covid. Abdominal pain with vomiting and right upper quadrant pain EXAM: PORTABLE CHEST 1 VIEW COMPARISON:  04/13/2020 FINDINGS: Heart and mediastinal contours are within normal limits. No focal opacities or effusions. No acute bony abnormality. IMPRESSION: No active disease. Electronically Signed   By: Rolm Baptise M.D.   On: 04/17/2021 09:19   CT Renal Stone Study  Result Date: 04/17/2021 CLINICAL DATA:  Flank pain, kidney stone suspected EXAM: CT ABDOMEN AND PELVIS WITHOUT CONTRAST TECHNIQUE: Multidetector CT imaging of the abdomen and pelvis was performed following the standard protocol without IV contrast. COMPARISON:  01/25/2021 FINDINGS: Lower chest: No acute abnormality. Hepatobiliary: Hepatic steatosis. Gallbladder is unremarkable. No biliary dilatation. Pancreas: Unremarkable. Spleen: Unremarkable. Adrenals/Urinary Tract: Adrenals are unremarkable. Small bilateral nonobstructing renal calculi again identified measuring up to 2 mm. No definite ureteral  calculi. Partially distended bladder is unremarkable. Stomach/Bowel: Stomach is within normal limits. Bowel is normal in caliber. Normal appendix. Vascular/Lymphatic: No significant vascular abnormality on this noncontrast study. No enlarged nodes. Reproductive: Uterus and bilateral adnexa are unremarkable. Other: No free fluid.  No acute abnormality of the abdominal wall. Musculoskeletal: No acute osseous abnormality. IMPRESSION: No acute abnormality.  Small bilateral nonobstructing renal calculi. Hepatic steatosis. Electronically Signed   By: Macy Mis M.D.   On: 04/17/2021 10:28   US Abdomen Limited RUQ (LIVER/GB)  Result Date: 04/17/2021 CLINICAL DATA:  Abdominal and right shoulder pain. EXAM: ULTRASOUND ABDOMEN LIMITED RIGHT UPPER QUADRANT COMPARISON:  01/26/2021 and CT abdomen pelvis 01/25/2021. FINDINGS: Gallbladder: No gallstones or wall thickening visualized. No sonographic Murphy sign noted by sonographer. Common bile duct: Diameter: 4 mm, within normal limits. Liver: Diffusely increased in echogenicity. Assessment of the portal vein is limited due to body habitus. Other: None. IMPRESSION: 1. Assessment of the portal vein is limited due to body habitus. 2. Otherwise, no acute findings. 3. Hepatic steatosis. Electronically Signed   By: Lorin Picket M.D.   On: 04/17/2021 08:35    Procedures Procedures   Medications Ordered in ED Medications  sodium chloride 0.9 % bolus 1,000 mL (1,000 mLs Intravenous New Bag/Given 04/17/21 0805)  morphine 4 MG/ML injection 4 mg (4 mg Intravenous Given 04/17/21 0802)  promethazine (PHENERGAN) 12.5 mg in sodium chloride 0.9 % 50 mL IVPB (12.5 mg Intravenous New Bag/Given 04/17/21 0808)  ketorolac (TORADOL) 30 MG/ML injection 30 mg (30 mg Intravenous Given 04/17/21 1045)  HYDROcodone-acetaminophen (NORCO/VICODIN) 5-325 MG per tablet 1 tablet (1 tablet Oral Given 04/17/21 1047)    ED Course  I have reviewed the triage vital signs and the nursing  notes.  Pertinent labs & imaging results that were available during my care of the patient were reviewed by me and considered in my medical decision making (see chart for details).    MDM Rules/Calculators/A&P                         BS is elevated at 330.  She has a hx of being "pre-diabetic" and has been on Metformin 500 mg qday.  She did not take it today due to vomiting.  She has not been on steroids for the Covid.  Pt is told to increase metformin to bid.  Hgb a1c is pending.  RUQ [place] and CT renal ok. CXR not showing pna. Pt's RUQ pain is likely due to coughing.  N/v could be due to paxlovid and zithromax.  Paxlovid was started over 5 days from the start of sx, so will likely be of little help.  She is told to stop it.  She also has no reason to be on zithromax as she has covid, not pna.    Pt is to return if worse.  F/u with pcp.  Amyriah Buras was evaluated in Emergency Department on 04/17/2021 for the symptoms described in the history of present illness. She was evaluated in the context of the global COVID-19 pandemic, which necessitated consideration that the patient might be at risk for infection with the SARS-CoV-2 virus that causes COVID-19. Institutional protocols and algorithms that pertain to the evaluation of patients at risk for COVID-19 are in a state of rapid change based on information released by regulatory bodies including the CDC and federal and state organizations. These policies and algorithms were followed during the patient's care in the ED.    Final Clinical Impression(s) / ED Diagnoses Final diagnoses:  Abdominal pain  Hyperglycemia  Nausea and vomiting, unspecified vomiting type  Dehydration    Rx / DC Orders ED Discharge Orders          Ordered    HYDROcodone-acetaminophen (NORCO/VICODIN) 5-325 MG tablet  Every 4 hours PRN        04/17/21 1043    metFORMIN (GLUCOPHAGE) 500 MG tablet  2 times daily with meals        04/17/21 1044    promethazine (PHENERGAN)  25 MG tablet  Every 6 hours PRN        04/17/21 1044             Isla Pence, MD 04/17/21 1051

## 2021-05-07 ENCOUNTER — Other Ambulatory Visit: Payer: Self-pay | Admitting: Obstetrics & Gynecology

## 2021-09-09 ENCOUNTER — Ambulatory Visit (INDEPENDENT_AMBULATORY_CARE_PROVIDER_SITE_OTHER): Payer: Medicaid Other

## 2021-09-09 ENCOUNTER — Other Ambulatory Visit: Payer: Self-pay

## 2021-09-09 ENCOUNTER — Encounter (HOSPITAL_COMMUNITY): Payer: Self-pay

## 2021-09-09 ENCOUNTER — Encounter: Payer: Self-pay | Admitting: Emergency Medicine

## 2021-09-09 ENCOUNTER — Emergency Department (HOSPITAL_COMMUNITY): Payer: Medicaid Other

## 2021-09-09 ENCOUNTER — Emergency Department (HOSPITAL_COMMUNITY)
Admission: EM | Admit: 2021-09-09 | Discharge: 2021-09-09 | Disposition: A | Discharge status: Home / Self Care | Payer: Medicaid Other | Attending: Emergency Medicine | Admitting: Emergency Medicine

## 2021-09-09 ENCOUNTER — Ambulatory Visit
Admission: EM | Admit: 2021-09-09 | Discharge: 2021-09-09 | Disposition: A | Discharge status: Home / Self Care | Payer: Medicaid Other | Attending: Nurse Practitioner | Admitting: Nurse Practitioner

## 2021-09-09 DIAGNOSIS — R0602 Shortness of breath: Secondary | ICD-10-CM | POA: Diagnosis not present

## 2021-09-09 DIAGNOSIS — R079 Chest pain, unspecified: Secondary | ICD-10-CM | POA: Diagnosis not present

## 2021-09-09 DIAGNOSIS — Z7984 Long term (current) use of oral hypoglycemic drugs: Secondary | ICD-10-CM | POA: Insufficient documentation

## 2021-09-09 DIAGNOSIS — E876 Hypokalemia: Secondary | ICD-10-CM | POA: Diagnosis not present

## 2021-09-09 DIAGNOSIS — E1165 Type 2 diabetes mellitus with hyperglycemia: Secondary | ICD-10-CM | POA: Diagnosis not present

## 2021-09-09 DIAGNOSIS — Z79899 Other long term (current) drug therapy: Secondary | ICD-10-CM | POA: Diagnosis not present

## 2021-09-09 DIAGNOSIS — R519 Headache, unspecified: Secondary | ICD-10-CM | POA: Insufficient documentation

## 2021-09-09 DIAGNOSIS — I10 Essential (primary) hypertension: Secondary | ICD-10-CM | POA: Insufficient documentation

## 2021-09-09 LAB — POC URINE PREG, ED: Preg Test, Ur: NEGATIVE

## 2021-09-09 LAB — BASIC METABOLIC PANEL
Anion gap: 12 (ref 5–15)
BUN: 12 mg/dL (ref 6–20)
CO2: 24 mmol/L (ref 22–32)
Calcium: 8.1 mg/dL — ABNORMAL LOW (ref 8.9–10.3)
Chloride: 102 mmol/L (ref 98–111)
Creatinine, Ser: 0.85 mg/dL (ref 0.44–1.00)
GFR, Estimated: >60 mL/min (ref >60)
Glucose, Bld: 117 mg/dL — ABNORMAL HIGH (ref 70–99)
Potassium: 3.2 mmol/L — ABNORMAL LOW (ref 3.5–5.1)
Sodium: 138 mmol/L (ref 135–145)

## 2021-09-09 LAB — CBC
HCT: 44 % (ref 36.0–46.0)
Hemoglobin: 13.9 g/dL (ref 12.0–15.0)
MCH: 26.5 pg (ref 26.0–34.0)
MCHC: 31.6 g/dL (ref 30.0–36.0)
MCV: 83.8 fL (ref 80.0–100.0)
Platelets: 228 10*3/uL (ref 150–400)
RBC: 5.25 MIL/uL — ABNORMAL HIGH (ref 3.87–5.11)
RDW: 13.5 % (ref 11.5–15.5)
WBC: 6.1 10*3/uL (ref 4.0–10.5)
nRBC: 0 % (ref 0.0–0.2)

## 2021-09-09 LAB — TROPONIN I (HIGH SENSITIVITY)
Troponin I (High Sensitivity): 3 ng/L (ref <18)
Troponin I (High Sensitivity): 3 ng/L (ref <18)

## 2021-09-09 LAB — D-DIMER, QUANTITATIVE: D-Dimer, Quant: 1.2 ug/mL-FEU — ABNORMAL HIGH (ref 0.00–0.50)

## 2021-09-09 MED ORDER — PROCHLORPERAZINE EDISYLATE 10 MG/2ML IJ SOLN
10.0000 mg | Freq: Once | INTRAMUSCULAR | Status: AC
Start: 2021-09-09 — End: 2021-09-09
  Administered 2021-09-09: 10 mg via INTRAVENOUS
  Filled 2021-09-09: qty 2

## 2021-09-09 MED ORDER — IOHEXOL 350 MG/ML SOLN
100.0000 mL | Freq: Once | INTRAVENOUS | Status: AC | PRN
  Administered 2021-09-09: 75 mL via INTRAVENOUS

## 2021-09-09 MED ORDER — DIPHENHYDRAMINE HCL 50 MG/ML IJ SOLN
12.5000 mg | Freq: Once | INTRAMUSCULAR | Status: AC
  Administered 2021-09-09: 12.5 mg via INTRAVENOUS
  Filled 2021-09-09: qty 1

## 2021-09-09 MED ORDER — KETOROLAC TROMETHAMINE 15 MG/ML IJ SOLN
15.0000 mg | Freq: Once | INTRAMUSCULAR | Status: AC
  Administered 2021-09-09: 15 mg via INTRAVENOUS
  Filled 2021-09-09: qty 1

## 2021-09-09 MED ORDER — SODIUM CHLORIDE 0.9 % IV BOLUS
1000.0000 mL | Freq: Once | INTRAVENOUS | Status: AC
  Administered 2021-09-09: 1000 mL via INTRAVENOUS

## 2021-09-09 MED ORDER — POTASSIUM CHLORIDE CRYS ER 20 MEQ PO TBCR
40.0000 meq | EXTENDED_RELEASE_TABLET | Freq: Once | ORAL | Status: AC
  Administered 2021-09-09: 40 meq via ORAL
  Filled 2021-09-09: qty 2

## 2021-09-09 NOTE — ED Notes (Signed)
Patient transported to CT 

## 2021-09-09 NOTE — ED Triage Notes (Signed)
Pain on left side last night.  C/o right arm tingling today.  States she feels SOB.  States chest doesn't feel right.  States she thought it was anxiety.  States she took her anxiety medication and it hasn't helped. ?

## 2021-09-09 NOTE — ED Notes (Signed)
Patient back from CT.

## 2021-09-09 NOTE — Discharge Instructions (Signed)
As we discussed, your work-up in the ER today was reassuring for acute abnormalities.  No emergent findings were found on your laboratory evaluation or imaging that was obtained.  I highly recommend that you call your primary care doctor and schedule an appointment for continued evaluation and management of your symptoms in the next few days. ? ?Return if development of any new or worsening symptoms. ?

## 2021-09-09 NOTE — Discharge Instructions (Addendum)
Patient would like to go to the ER for further evaluation and work-up. ?

## 2021-09-09 NOTE — ED Provider Notes (Signed)
?[place] ? ? ? ?CSN: [id number] ?Arrival date & time: 09/09/21  1525 ? ? ?  ? ?History   ?Chief Complaint ?No chief complaint on file. ? ? ?HPI ?Janice Taylor is a 31 y.o. female.  ? ?The patient is a 31 year old female who presents with complaints of chest pain.  Patient states symptoms started this morning upon awakening.  Pain is located in the left chest per her report.  She states "it is difficult to describe, but something does not feel right".  She also complains of nausea.  Patient states that she does take Mounjaro for weight loss.  Patient states that she does have a history of anxiety.  She takes Lexapro and venlafaxine.  States that she took her medication this morning and thought that it would relieve her symptoms as she thought her chest pain was related to anxiety.  She states that the medication did not help.  She denies shortness of breath, difficulty breathing, or diaphoresis.  Patient does have a history of diabetes and hypertension.  Patient states that she takes amlodipine and hydrochlorothiazide for her blood pressure. ? ?The history is provided by the patient.  ? ?Past Medical History:  ?Diagnosis Date  ? ADHD (attention deficit hyperactivity disorder)   ? Anxiety   ? Clotting disorder ([place])   ? Cough   ? Depression   ? Genital HSV   ? Hypertension   ? 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 Consistent with ITP   ? Leg swelling   ? Obesity 01/05/2013  ? Thrombocytopenia ([place]) 11/25/2011  ? Consistent with ITP  ? ? ?Patient Active Problem List  ? Diagnosis Date Noted  ? Anterior anal fissure 12/09/2020  ? Positive fecal occult blood test 12/03/2020  ? Boil of groin 03/31/2020  ? Hemorrhoids 03/31/2020  ? Hypertension 03/31/2020  ? Encounter for IUD insertion 01/22/2019  ? Anxiety 11/07/2018  ? History of ITP 11/25/2011  ? ? ?Past Surgical History:  ?Procedure Laterality Date  ? CESAREAN SECTION N/A 12/18/2018  ? Procedure: CESAREAN SECTION;  Surgeon: Woodroe Mode, MD;   Location: [facility] LD ORS;  Service: Obstetrics;  Laterality: N/A;  ? TONSILECTOMY, ADENOIDECTOMY, BILATERAL MYRINGOTOMY AND TUBES    ? ? ?OB History   ? ? Gravida  ?3  ? Para  ?2  ? Term  ?1  ? Preterm  ?1  ? AB  ?1  ? Living  ?2  ?  ? ? SAB  ?1  ? IAB  ?   ? Ectopic  ?   ? Multiple  ?0  ? Live Births  ?2  ?   ?  ?  ? ? ? ?Home Medications   ? ?Prior to Admission medications   ?Medication Sig Start Date End Date Taking? Authorizing Provider  ?ALPRAZolam (XANAX) 1 MG tablet Take 1 mg by mouth 3 (three) times daily as needed. 11/21/20   [provider]  ?amLODipine (NORVASC) 10 MG tablet Take 10 mg by mouth daily. 02/14/20   [provider]  ?amoxicillin (AMOXIL) 500 MG tablet Take 500 mg by mouth 2 (two) times daily.    [provider]  ?cyclobenzaprine (FLEXERIL) 10 MG tablet TAKE 1 TABLET EVERY 8 HOURS AS NEEDED FOR MUSCLE SPASMS. 05/07/21   Florian Buff, MD  ?dicyclomine (BENTYL) 20 MG tablet Take 20 mg by mouth 4 (four) times daily. 11/20/20   [provider]  ?escitalopram (LEXAPRO) 10 MG tablet  09/17/20   [provider]  ?  hydrochlorothiazide (HYDRODIURIL) 25 MG tablet Take 25 mg by mouth daily.    [provider]  ?HYDROcodone-acetaminophen (NORCO/VICODIN) 5-325 MG tablet Take 1 tablet by mouth every 4 (four) hours as needed. 04/17/21   Isla Pence, MD  ?hydrocortisone (ANUSOL-HC) 25 MG suppository Place 1 suppository (25 mg total) rectally 2 (two) times daily. 12/03/20   Estill Dooms, NP  ?ibuprofen (ADVIL) 800 MG tablet Take 800 mg by mouth every 8 (eight) hours as needed.    [provider]  ?Levonorgestrel (KYLEENA IU) by Intrauterine route.    [provider]  ?lidocaine (XYLOCAINE) 5 % ointment Apply topically 4 (four) times daily as needed. 12/03/20   Estill Dooms, NP  ?metFORMIN (GLUCOPHAGE) 500 MG tablet Take 1 tablet (500 mg total) by mouth 2 (two) times daily with a meal. 04/17/21   Isla Pence, MD  ?oxyCODONE  (ROXICODONE) 5 MG immediate release tablet Take 1 tablet (5 mg total) by mouth every 4 (four) hours as needed for severe pain. 01/25/21   Maudie Flakes, MD  ?pramoxine-hydrocortisone (PROCTOCREAM-HC) 1-1 % rectal cream Place 1 application rectally 2 (two) times daily. 12/03/20   Estill Dooms, NP  ?promethazine (PHENERGAN) 25 MG tablet Take 1 tablet (25 mg total) by mouth every 6 (six) hours as needed for nausea or vomiting. 04/17/21   Isla Pence, MD  ?valACYclovir (VALTREX) 1000 MG tablet Take 1 tablet (1,000 mg total) by mouth daily. 08/15/20   Florian Buff, MD  ?zolpidem (AMBIEN) 10 MG tablet Take 1 tablet (10 mg total) by mouth at bedtime. 07/21/20   Cory Munch, PA-C  ? ? ?Family History ?Family History  ?Problem Relation Age of Onset  ? Diabetes Maternal Uncle   ? Kidney disease Maternal Uncle   ?     renal failure/dialysis  ? Heart attack Maternal Grandmother   ? ? ?Social History ?Social History  ? ?Tobacco Use  ? Smoking status: Never  ? Smokeless tobacco: Never  ?Vaping Use  ? Vaping Use: Never used  ?Substance Use Topics  ? Alcohol use: No  ? Drug use: No  ? ? ? ?Allergies   ?Ondansetron hcl and Fentanyl ? ? ?Review of Systems ?Review of Systems  ?Constitutional:  Positive for fatigue.  ?Respiratory: Negative.    ?Cardiovascular:  Positive for chest pain.  ?Gastrointestinal:  Positive for nausea.  ?Skin: Negative.   ?Psychiatric/Behavioral: Negative.    ? ? ?Physical Exam ?Triage Vital Signs ?ED Triage Vitals  ?Enc Vitals Group  ?   BP 09/09/21 1531 (!) 170/95  ?   Pulse Rate 09/09/21 1531 (!) 128  ?   Resp 09/09/21 1531 18  ?   Temp 09/09/21 1531 98.2 ?F (36.8 ?C)  ?   Temp Source 09/09/21 1531 Oral  ?   SpO2 09/09/21 1531 97 %  ?   Weight --   ?   Height --   ?   Head Circumference --   ?   Peak Flow --   ?   Pain Score 09/09/21 1532 0  ?   Pain Loc --   ?   Pain Edu? --   ?   Excl. in [place]? --   ? ?No data found. ? ?Updated Vital Signs ?BP (!) 170/95 (BP Location: Right Arm)   Pulse (!)  128   Temp 98.2 ?F (36.8 ?C) (Oral)   Resp 18   SpO2 97%  ? ?Visual Acuity ?Right Eye Distance:   ?Left Eye Distance:   ?  Bilateral Distance:   ? ?Right Eye Near:   ?Left Eye Near:    ?Bilateral Near:    ? ?Physical Exam ?Vitals and nursing note reviewed.  ?Constitutional:   ?   Appearance: Normal appearance. She is well-developed.  ?HENT:  ?   Head: Normocephalic and atraumatic.  ?   Right Ear: Tympanic membrane, ear canal and external ear normal.  ?   Left Ear: Tympanic membrane, ear canal and external ear normal.  ?   Nose: Nose normal.  ?Eyes:  ?   Extraocular Movements: Extraocular movements intact.  ?   Conjunctiva/sclera: Conjunctivae normal.  ?   Pupils: Pupils are equal, round, and reactive to light.  ?Cardiovascular:  ?   Rate and Rhythm: Regular rhythm. Tachycardia present.  ?   Heart sounds: No murmur heard. ?Pulmonary:  ?   Effort: Pulmonary effort is normal. No respiratory distress.  ?   Breath sounds: Normal breath sounds.  ?Abdominal:  ?   Palpations: Abdomen is soft.  ?   Tenderness: There is no abdominal tenderness.  ?Musculoskeletal:     ?   General: No swelling.  ?   Cervical back: Neck supple.  ?Skin: ?   General: Skin is warm and dry.  ?   Capillary Refill: Capillary refill takes less than 2 seconds.  ?Neurological:  ?   General: No focal deficit present.  ?   Mental Status: She is alert and oriented to person, place, and time.  ?Psychiatric:     ?   Mood and Affect: Mood normal.     ?   Behavior: Behavior normal.  ? ? ? ?UC Treatments / Results  ?Labs ?(all labs ordered are listed, but only abnormal results are displayed) ?Labs Reviewed - No data to display ? ?EKG Rightward axis; T-wave abnormality, consider inferolateral ischemia ? ? ?Radiology ?DG Chest 2 View ? ?Result Date: 09/09/2021 ?CLINICAL DATA:  Chest pain. EXAM: CHEST - 2 VIEW COMPARISON:  Chest x-ray 04/17/2021 FINDINGS: The heart size and mediastinal contours are within normal limits. Both lungs are clear. The visualized skeletal  structures are unremarkable. IMPRESSION: No active cardiopulmonary disease. Electronically Signed   By: Ronney Asters M.D.   On: 09/09/2021 16:02   ? ?Procedures ?Procedures (including critical care time) ?

## 2021-09-09 NOTE — ED Triage Notes (Addendum)
Pt c/o L chest pain, headache, and nausea since last night and R arm tingling, dizziness, and SOB starting this afternoon.  Pain score 6/10.  Pt reports she thought it was her anxiety but symptoms were not relieved by her medications. ? ?Pt was seen at UC earlier for same and had a chest xray which can be seen in Results.     ?

## 2021-09-09 NOTE — ED Provider Notes (Signed)
[facility] Provider Note   CSN: [id number] Arrival date & time: 09/09/21  1633     History  Chief Complaint  Patient presents with   Chest Pain   Headache   Nausea    Janice Taylor is a 31 y.o. female.  Patient with history of diabetes, hypertension, morbid obesity, and anxiety presents today with complaints of chest pain and shortness of breath. She states that same began earlier today.  States that when she woke up this morning she was nauseous and throughout the day developed left-sided chest pain that radiated down her right arm.  She states that she has also been somewhat short of breath as well.  She initially thought that the symptoms could be related to her anxiety, however she took her prescribed Xanax and did not get any better.  She also endorses a frontal headache with associated photophobia.  She endorsed concern that her symptoms may be related to hypoglycemia given that she does have a history of diabetes and does not check her blood sugars at home.  Denies fevers, chills, vomiting, diarrhea, abdominal pain.  No leg pain or swelling.  No history of DVT/PE. Of note, patient states that she does not eat or drink much since she started her new diabetes medication, expresses concern that she is dehydrated. Patient does not smoke, drink alcohol, and denies recreational drug use or IVDU.   The history is provided by the patient. No language interpreter was used.  Chest Pain Associated symptoms: headache, nausea and shortness of breath   Associated symptoms: no abdominal pain, no cough, no dizziness, no fever, no numbness, no palpitations, no vomiting and no weakness   Headache Associated symptoms: nausea and photophobia   Associated symptoms: no abdominal pain, no congestion, no cough, no diarrhea, no dizziness, no fever, no numbness, no seizures, no vomiting and no weakness       Home Medications Prior to Admission medications   Medication Sig Start  Date End Date Taking? Authorizing Provider  ALPRAZolam Duanne Moron) 1 MG tablet Take 1 mg by mouth 3 (three) times daily as needed. 11/21/20   [provider]  amLODipine (NORVASC) 10 MG tablet Take 10 mg by mouth daily. 02/14/20   [provider]  amoxicillin (AMOXIL) 500 MG tablet Take 500 mg by mouth 2 (two) times daily.    [provider]  cyclobenzaprine (FLEXERIL) 10 MG tablet TAKE 1 TABLET EVERY 8 HOURS AS NEEDED FOR MUSCLE SPASMS. 05/07/21   Florian Buff, MD  dicyclomine (BENTYL) 20 MG tablet Take 20 mg by mouth 4 (four) times daily. 11/20/20   [provider]  escitalopram (LEXAPRO) 10 MG tablet  09/17/20   [provider]  hydrochlorothiazide (HYDRODIURIL) 25 MG tablet Take 25 mg by mouth daily.    [provider]  HYDROcodone-acetaminophen (NORCO/VICODIN) 5-325 MG tablet Take 1 tablet by mouth every 4 (four) hours as needed. 04/17/21   Isla Pence, MD  hydrocortisone (ANUSOL-HC) 25 MG suppository Place 1 suppository (25 mg total) rectally 2 (two) times daily. 12/03/20   Estill Dooms, NP  ibuprofen (ADVIL) 800 MG tablet Take 800 mg by mouth every 8 (eight) hours as needed.    [provider]  Levonorgestrel (KYLEENA IU) by Intrauterine route.    [provider]  lidocaine (XYLOCAINE) 5 % ointment Apply topically 4 (four) times daily as needed. 12/03/20   Estill Dooms, NP  metFORMIN (GLUCOPHAGE) 500 MG tablet Take 1 tablet (500 mg total) by  mouth 2 (two) times daily with a meal. 04/17/21   Isla Pence, MD  oxyCODONE (ROXICODONE) 5 MG immediate release tablet Take 1 tablet (5 mg total) by mouth every 4 (four) hours as needed for severe pain. 01/25/21   Maudie Flakes, MD  pramoxine-hydrocortisone (PROCTOCREAM-HC) 1-1 % rectal cream Place 1 application rectally 2 (two) times daily. 12/03/20   Estill Dooms, NP  promethazine (PHENERGAN) 25 MG tablet Take 1 tablet (25 mg total) by mouth every 6 (six) hours  as needed for nausea or vomiting. 04/17/21   Isla Pence, MD  valACYclovir (VALTREX) 1000 MG tablet Take 1 tablet (1,000 mg total) by mouth daily. 08/15/20   Florian Buff, MD  zolpidem (AMBIEN) 10 MG tablet Take 1 tablet (10 mg total) by mouth at bedtime. 07/21/20   Cory Munch, PA-C      Allergies    Ondansetron hcl and Fentanyl    Review of Systems   Review of Systems  Constitutional:  Negative for chills and fever.  HENT:  Negative for congestion.   Eyes:  Positive for photophobia. Negative for visual disturbance.  Respiratory:  Positive for shortness of breath. Negative for cough, wheezing and stridor.   Cardiovascular:  Positive for chest pain. Negative for palpitations and leg swelling.  Gastrointestinal:  Positive for nausea. Negative for abdominal pain, diarrhea and vomiting.  Neurological:  Positive for headaches. Negative for dizziness, tremors, seizures, syncope, facial asymmetry, speech difficulty, weakness, light-headedness and numbness.  Psychiatric/Behavioral:  Negative for confusion and decreased concentration.   All other systems reviewed and are negative.  Physical Exam Updated Vital Signs BP (!) 148/109 (BP Location: Right Arm)   Pulse (!) 123   Temp 99.6 F (37.6 C) (Oral)   Resp 20   Ht '5\' 4"'$  (1.626 m)   SpO2 98%   BMI 65.90 kg/m  Physical Exam Vitals and nursing note reviewed.  Constitutional:      General: She is not in acute distress.    Appearance: Normal appearance. She is well-developed. She is obese. She is not ill-appearing, toxic-appearing or diaphoretic.     Comments: Patient laying flat in bed resting comfortably in bed in no acute distress   HENT:     Head: Normocephalic and atraumatic.  Cardiovascular:     Rate and Rhythm: Normal rate and regular rhythm.     Pulses:          Radial pulses are 2+ on the right side and 2+ on the left side.     Heart sounds: Normal heart sounds.  Pulmonary:     Effort: Pulmonary effort is normal.  No respiratory distress.     Breath sounds: Normal breath sounds.  Chest:     Chest wall: No tenderness.  Abdominal:     Palpations: Abdomen is soft.  Musculoskeletal:        General: Normal range of motion.     Cervical back: Normal range of motion.     Right lower leg: No tenderness. No edema.     Left lower leg: No tenderness. No edema.  Skin:    General: Skin is warm and dry.  Neurological:     General: No focal deficit present.     Mental Status: She is alert and oriented to person, place, and time.     Cranial Nerves: No cranial nerve deficit.     Motor: No weakness.  Psychiatric:        Mood and Affect: Mood normal.  Behavior: Behavior normal.    ED Results / Procedures / Treatments   Labs (all labs ordered are listed, but only abnormal results are displayed) Labs Reviewed  BASIC METABOLIC PANEL - Abnormal; Notable for the following components:      Result Value   Potassium 3.2 (*)    Glucose, Bld 117 (*)    Calcium 8.1 (*)    All other components within normal limits  CBC - Abnormal; Notable for the following components:   RBC 5.25 (*)    All other components within normal limits  D-DIMER, QUANTITATIVE - Abnormal; Notable for the following components:   D-Dimer, Quant 1.20 (*)    All other components within normal limits  POC URINE PREG, ED  TROPONIN I (HIGH SENSITIVITY)  TROPONIN I (HIGH SENSITIVITY)    EKG None  Radiology DG Chest 2 View  Result Date: 09/09/2021 CLINICAL DATA:  Chest pain. EXAM: CHEST - 2 VIEW COMPARISON:  Chest x-ray 04/17/2021 FINDINGS: The heart size and mediastinal contours are within normal limits. Both lungs are clear. The visualized skeletal structures are unremarkable. IMPRESSION: No active cardiopulmonary disease. Electronically Signed   By: Ronney Asters M.D.   On: 09/09/2021 16:02   CT Angio Chest PE W and/or Wo Contrast  Result Date: 09/09/2021 CLINICAL DATA:  Pulmonary embolism (PE) suspected, positive D-dimer EXAM:  CT ANGIOGRAPHY CHEST WITH CONTRAST TECHNIQUE: Multidetector CT imaging of the chest was performed using the standard protocol during bolus administration of intravenous contrast. Multiplanar CT image reconstructions and MIPs were obtained to evaluate the vascular anatomy. RADIATION DOSE REDUCTION: This exam was performed according to the departmental dose-optimization program which includes automated exposure control, adjustment of the mA and/or kV according to patient size and/or use of iterative reconstruction technique. CONTRAST:  60m OMNIPAQUE IOHEXOL 350 MG/ML SOLN COMPARISON:  April 13, 2020 FINDINGS: Cardiovascular: Evaluation is limited by respiratory motion, particularly at the bases. Within these limitations, no acute pulmonary embolism is visualized through the proximal subsegmental pulmonary arteries. Heart is normal in size. No pericardial effusion. Thoracic aorta is normal in course and caliber. Mediastinum/Nodes: Streak artifact limits evaluation of the thyroid; no discrete nodules are definitively visualized. No axillary or mediastinal adenopathy. Lungs/Pleura: No pleural effusion or pneumothorax. No focal consolidation or pulmonary mass. Upper Abdomen: Hepatic steatosis. Musculoskeletal: No chest wall abnormality. No acute or significant osseous findings. Review of the MIP images confirms the above findings. IMPRESSION: 1. No acute pulmonary embolism. 2. No suspicious CT etiology for LEFT-sided chest pain identified. 3. Hepatic steatosis. Electronically Signed   By: SValentino SaxonM.D.   On: 09/09/2021 19:49    Procedures Procedures    Medications Ordered in ED Medications  potassium chloride SA (KLOR-CON M) CR tablet 40 mEq (40 mEq Oral Given 09/09/21 1835)  sodium chloride 0.9 % bolus 1,000 mL (1,000 mLs Intravenous New Bag/Given 09/09/21 1844)  prochlorperazine (COMPAZINE) injection 10 mg (10 mg Intravenous Given 09/09/21 1949)  diphenhydrAMINE (BENADRYL) injection 12.5 mg (12.5  mg Intravenous Given 09/09/21 1947)  ketorolac (TORADOL) 15 MG/ML injection 15 mg (15 mg Intravenous Given 09/09/21 1947)  iohexol (OMNIPAQUE) 350 MG/ML injection 100 mL (75 mLs Intravenous Contrast Given 09/09/21 1934)    ED Course/ Medical Decision Making/ A&P                           Medical Decision Making Amount and/or Complexity of Data Reviewed Labs: ordered. Radiology: ordered.  Risk Prescription drug management.   This  patient presents to the ED for concern of chest pain, shortness of breath, this involves an extensive number of treatment options, and is a complaint that carries with it a high risk of complications and morbidity.    Co morbidities that complicate the patient evaluation  Hx diabetes, hypertension, morbid obesity   Additional history obtained:  Additional history obtained from previous ER notes and imaging  Lab Tests:  I Ordered, and personally interpreted labs.  The pertinent results include:  no leukocytosis. K 3.2 likely due to HCTZ use.  Troponin negative.  D-dimer 1.20   Imaging Studies ordered:  I ordered imaging studies including CXR, CTPE  I independently visualized and interpreted imaging which showed  CXR: NAD CTPE:  1. No acute pulmonary embolism. 2. No suspicious CT etiology for LEFT-sided chest pain identified. 3. Hepatic steatosis. I agree with the radiologist interpretation   Cardiac Monitoring: / EKG:  The patient was maintained on a cardiac monitor.  I personally viewed and interpreted the cardiac monitored which showed an underlying rhythm of: sinus tachycardia, no STEMI   Problem List / ED Course / Critical interventions / Medication management  I ordered medication including oral potassium for hypokalemia,  fluids for dehydration, and compazine and benadryl for nausea/headache Reevaluation of the patient after these medicines showed that the patient improved I have reviewed the patients home medicines and have made  adjustments as needed  Patient presents today with chest pain and shortness of breath.  She is afebrile, nontoxic-appearing, and in no acute distress with reassuring vital signs.  Some tachycardia noted on initial presentation improved with fluids.  CT PE negative after headache cocktail, patient states that she is feeling much better.   Given the large differential diagnosis for [facility], the decision making in this case is of high complexity.  After evaluating all of the data points in this case, the presentation of Kaylianna Detert is NOT consistent with Acute Coronary Syndrome (ACS) and/or myocardial ischemia, pulmonary embolism, aortic dissection; Borhaave's, significant arrythmia, pneumothorax, cardiac tamponade, or other emergent cardiopulmonary condition.  Further, the presentation of Ann Bohne is NOT consistent with pericarditis, myocarditis, cholecystitis, pancreatitis, mediastinitis, endocarditis, new valvular disease.  Additionally, the presentation of Cambelle Haskinsis NOT consistent with flail chest, cardiac contusion, ARDS, or significant intra-thoracic or intra-abdominal bleeding.  Moreover, this presentation is NOT consistent with pneumonia or sepsis.  Patient does have an extensive history of anxiety, in the absence of any other acute findings, suspect that this is playing a role in the patient's symptoms.  She does state that she has been under more stress recently.  Given improvement with headache cocktail and fluids, no further emergent concerns at this time.  She is stable for discharge.  Educated on red flag symptoms of prompt immediate return.  Also educated on the importance of close PCP follow-up for further evaluation and management of her symptoms.  Discharged in stable condition.  Strict return and follow-up precautions have been given by me personally or by detailed written instruction given verbally by nursing staff using the teach back method to the  patient/family/caregiver(s).  Data Reviewed/Counseling: I have reviewed the patient's vital signs, nursing notes, and other relevant tests/information. I had a detailed discussion regarding the historical points, exam findings, and any diagnostic results supporting the discharge diagnosis. I also discussed the need for outpatient follow-up and the need to return to the ED if symptoms worsen or if there are any questions or concerns that arise at home.  Final Clinical Impression(s) /  ED Diagnoses Final diagnoses:  Chest pain, unspecified type  Shortness of breath    Rx / DC Orders ED Discharge Orders     None     An After Visit Summary was printed and given to the patient.     Nestor Lewandowsky 09/09/21 2039    Noemi Chapel, MD 09/10/21 1052

## 2021-09-22 ENCOUNTER — Other Ambulatory Visit: Payer: Self-pay

## 2021-09-22 ENCOUNTER — Encounter (HOSPITAL_COMMUNITY): Payer: Self-pay | Admitting: Emergency Medicine

## 2021-09-22 ENCOUNTER — Emergency Department (HOSPITAL_COMMUNITY)
Admission: EM | Admit: 2021-09-22 | Discharge: 2021-09-22 | Discharge status: Against Medical Advice | Payer: Medicaid Other | Attending: Emergency Medicine | Admitting: Emergency Medicine

## 2021-09-22 ENCOUNTER — Emergency Department (HOSPITAL_COMMUNITY): Payer: Medicaid Other

## 2021-09-22 DIAGNOSIS — R519 Headache, unspecified: Secondary | ICD-10-CM | POA: Diagnosis not present

## 2021-09-22 DIAGNOSIS — R0789 Other chest pain: Secondary | ICD-10-CM | POA: Insufficient documentation

## 2021-09-22 DIAGNOSIS — I10 Essential (primary) hypertension: Secondary | ICD-10-CM | POA: Insufficient documentation

## 2021-09-22 DIAGNOSIS — Z79899 Other long term (current) drug therapy: Secondary | ICD-10-CM | POA: Diagnosis not present

## 2021-09-22 LAB — BASIC METABOLIC PANEL
Anion gap: 8 (ref 5–15)
BUN: 11 mg/dL (ref 6–20)
CO2: 28 mmol/L (ref 22–32)
Calcium: 9.2 mg/dL (ref 8.9–10.3)
Chloride: 102 mmol/L (ref 98–111)
Creatinine, Ser: 0.77 mg/dL (ref 0.44–1.00)
GFR, Estimated: >60 mL/min (ref >60)
Glucose, Bld: 94 mg/dL (ref 70–99)
Potassium: 3 mmol/L — ABNORMAL LOW (ref 3.5–5.1)
Sodium: 138 mmol/L (ref 135–145)

## 2021-09-22 LAB — CBC
HCT: 40.8 % (ref 36.0–46.0)
Hemoglobin: 13 g/dL (ref 12.0–15.0)
MCH: 26.5 pg (ref 26.0–34.0)
MCHC: 31.9 g/dL (ref 30.0–36.0)
MCV: 83.1 fL (ref 80.0–100.0)
Platelets: 226 10*3/uL (ref 150–400)
RBC: 4.91 MIL/uL (ref 3.87–5.11)
RDW: 13.7 % (ref 11.5–15.5)
WBC: 7.7 10*3/uL (ref 4.0–10.5)
nRBC: 0 % (ref 0.0–0.2)

## 2021-09-22 LAB — TROPONIN I (HIGH SENSITIVITY)
Troponin I (High Sensitivity): 2 ng/L (ref <18)
Troponin I (High Sensitivity): 3 ng/L (ref <18)

## 2021-09-22 LAB — POC URINE PREG, ED: Preg Test, Ur: NEGATIVE

## 2021-09-22 MED ORDER — HYDROCODONE-ACETAMINOPHEN 5-325 MG PO TABS
2.0000 | ORAL_TABLET | Freq: Once | ORAL | Status: DC
  Filled 2021-09-22: qty 2

## 2021-09-22 MED ORDER — POTASSIUM CHLORIDE CRYS ER 20 MEQ PO TBCR
40.0000 meq | EXTENDED_RELEASE_TABLET | Freq: Once | ORAL | Status: AC
  Administered 2021-09-22: 40 meq via ORAL
  Filled 2021-09-22: qty 2

## 2021-09-22 MED ORDER — DIPHENHYDRAMINE HCL 50 MG/ML IJ SOLN
25.0000 mg | Freq: Once | INTRAMUSCULAR | Status: AC
  Administered 2021-09-22: 25 mg via INTRAVENOUS
  Filled 2021-09-22: qty 1

## 2021-09-22 MED ORDER — POTASSIUM CHLORIDE CRYS ER 20 MEQ PO TBCR
20.0000 meq | EXTENDED_RELEASE_TABLET | Freq: Two times a day (BID) | ORAL | 0 refills | Status: DC
Start: 2021-09-22 — End: 2023-01-05

## 2021-09-22 MED ORDER — SODIUM CHLORIDE 0.9 % IV BOLUS
1000.0000 mL | Freq: Once | INTRAVENOUS | Status: AC
  Administered 2021-09-22: 1000 mL via INTRAVENOUS

## 2021-09-22 MED ORDER — METOCLOPRAMIDE HCL 5 MG/ML IJ SOLN
10.0000 mg | Freq: Once | INTRAMUSCULAR | Status: AC
  Administered 2021-09-22: 10 mg via INTRAVENOUS
  Filled 2021-09-22: qty 2

## 2021-09-22 MED ORDER — KETOROLAC TROMETHAMINE 15 MG/ML IJ SOLN
15.0000 mg | Freq: Once | INTRAMUSCULAR | Status: AC
Start: 2021-09-22 — End: 2021-09-22
  Administered 2021-09-22: 15 mg via INTRAVENOUS
  Filled 2021-09-22: qty 1

## 2021-09-22 NOTE — ED Provider Notes (Signed)
[facility] Provider Note   CSN: [id number] Arrival date & time: 09/22/21  1230     History  Chief Complaint  Patient presents with   Chest Pain    Janice Taylor is a 31 y.o. female.  Pt complains of a headache and chest pain.  Pt reports she called her Physician and was told to come back to the ED.  Pt reports she has pain mid chest down her left arm.  She reports she also has a bad headache.  Pt concerned because her ddimer was elevated on her last visit   The history is provided by the patient. No language interpreter was used.  Chest Pain Pain location:  L chest Pain quality: aching   Pain radiates to:  Does not radiate Pain severity:  Moderate Onset quality:  Gradual Duration:  3 weeks Timing:  Constant Progression:  Worsening Chronicity:  New Relieved by:  Nothing Worsened by:  Nothing Ineffective treatments:  Antacids Associated symptoms: headache   Associated symptoms: no abdominal pain   Risk factors: hypertension       Home Medications Prior to Admission medications   Medication Sig Start Date End Date Taking? Authorizing Provider  potassium chloride SA (KLOR-CON M) 20 MEQ tablet Take 1 tablet (20 mEq total) by mouth 2 (two) times daily. 09/22/21  Yes Caryl Ada K, PA-C  ALPRAZolam Duanne Moron) 1 MG tablet Take 1 mg by mouth 3 (three) times daily as needed. 11/21/20   [provider]  amLODipine (NORVASC) 10 MG tablet Take 10 mg by mouth daily. 02/14/20   [provider]  amoxicillin (AMOXIL) 500 MG tablet Take 500 mg by mouth 2 (two) times daily.    [provider]  cyclobenzaprine (FLEXERIL) 10 MG tablet TAKE 1 TABLET EVERY 8 HOURS AS NEEDED FOR MUSCLE SPASMS. 05/07/21   Florian Buff, MD  dicyclomine (BENTYL) 20 MG tablet Take 20 mg by mouth 4 (four) times daily. 11/20/20   [provider]  escitalopram (LEXAPRO) 10 MG tablet  09/17/20   [provider]  hydrochlorothiazide (HYDRODIURIL) 25 MG  tablet Take 25 mg by mouth daily.    [provider]  HYDROcodone-acetaminophen (NORCO/VICODIN) 5-325 MG tablet Take 1 tablet by mouth every 4 (four) hours as needed. 04/17/21   Isla Pence, MD  hydrocortisone (ANUSOL-HC) 25 MG suppository Place 1 suppository (25 mg total) rectally 2 (two) times daily. 12/03/20   Estill Dooms, NP  ibuprofen (ADVIL) 800 MG tablet Take 800 mg by mouth every 8 (eight) hours as needed.    [provider]  Levonorgestrel (KYLEENA IU) by Intrauterine route.    [provider]  lidocaine (XYLOCAINE) 5 % ointment Apply topically 4 (four) times daily as needed. 12/03/20   Estill Dooms, NP  metFORMIN (GLUCOPHAGE) 500 MG tablet Take 1 tablet (500 mg total) by mouth 2 (two) times daily with a meal. 04/17/21   Isla Pence, MD  oxyCODONE (ROXICODONE) 5 MG immediate release tablet Take 1 tablet (5 mg total) by mouth every 4 (four) hours as needed for severe pain. 01/25/21   Maudie Flakes, MD  pramoxine-hydrocortisone (PROCTOCREAM-HC) 1-1 % rectal cream Place 1 application rectally 2 (two) times daily. 12/03/20   Estill Dooms, NP  promethazine (PHENERGAN) 25 MG tablet Take 1 tablet (25 mg total) by mouth every 6 (six) hours as needed for nausea or vomiting. 04/17/21   Isla Pence, MD  valACYclovir (VALTREX) 1000 MG tablet Take 1 tablet (1,000 mg total)  by mouth daily. 08/15/20   Florian Buff, MD  zolpidem (AMBIEN) 10 MG tablet Take 1 tablet (10 mg total) by mouth at bedtime. 07/21/20   Cory Munch, PA-C      Allergies    Ondansetron hcl and Fentanyl    Review of Systems   Review of Systems  Cardiovascular:  Positive for chest pain.  Gastrointestinal:  Negative for abdominal pain.  Neurological:  Positive for headaches.  All other systems reviewed and are negative.  Physical Exam Updated Vital Signs BP 134/77   Pulse 88   Temp 98 F (36.7 C) (Oral)   Resp 20   SpO2 99%  Physical Exam Vitals and nursing  note reviewed.  Constitutional:      Appearance: She is well-developed.  HENT:     Head: Normocephalic.  Cardiovascular:     Rate and Rhythm: Normal rate and regular rhythm.     Heart sounds: Normal heart sounds.  Pulmonary:     Effort: Pulmonary effort is normal.     Breath sounds: Normal breath sounds.  Chest:     Chest wall: Deformity present. No mass.  Abdominal:     General: Bowel sounds are normal. There is no distension.     Palpations: Abdomen is soft.  Musculoskeletal:        General: Normal range of motion.     Cervical back: Normal range of motion and neck supple.  Skin:    General: Skin is warm.  Neurological:     General: No focal deficit present.     Mental Status: She is alert and oriented to person, place, and time.  Psychiatric:        Mood and Affect: Mood normal.    ED Results / Procedures / Treatments   Labs (all labs ordered are listed, but only abnormal results are displayed) Labs Reviewed  BASIC METABOLIC PANEL - Abnormal; Notable for the following components:      Result Value   Potassium 3.0 (*)    All other components within normal limits  CBC  POC URINE PREG, ED  TROPONIN I (HIGH SENSITIVITY)  TROPONIN I (HIGH SENSITIVITY)    EKG EKG Interpretation  Date/Time:  Tuesday Sep 22 2021 12:43:50 EDT Ventricular Rate:  111 PR Interval:  130 QRS Duration: 96 QT Interval:  346 QTC Calculation: 470 R Axis:   110 Text Interpretation: Sinus tachycardia Left posterior fascicular block ST & T wave abnormality, consider inferior ischemia ST & T wave abnormality, consider anterolateral ischemia Abnormal ECG When compared with ECG of 09-Sep-2021 16:43, Inverted T waves have replaced nonspecific T wave abnormality in Anterior leads unchanged for multiple EKG's going back to 12/21 Confirmed by Noemi Chapel [phone]) on 09/22/2021 1:38:43 PM  Radiology DG Chest 2 View  Result Date: 09/22/2021 CLINICAL DATA:  Chest pain EXAM: CHEST - 2 VIEW COMPARISON:   09/09/2021 FINDINGS: The heart size and mediastinal contours are within normal limits. Both lungs are clear. The visualized skeletal structures are unremarkable. IMPRESSION: No active cardiopulmonary disease. Electronically Signed   By: Franchot Gallo M.D.   On: 09/22/2021 13:02    Procedures Procedures    Medications Ordered in ED Medications  HYDROcodone-acetaminophen (NORCO/VICODIN) 5-325 MG per tablet 2 tablet (2 tablets Oral Not Given 09/22/21 1638)  sodium chloride 0.9 % bolus 1,000 mL (0 mLs Intravenous Stopped 09/22/21 1642)  metoCLOPramide (REGLAN) injection 10 mg (10 mg Intravenous Given 09/22/21 1621)  diphenhydrAMINE (BENADRYL) injection 25 mg (25 mg Intravenous Given 09/22/21  1622)  ketorolac (TORADOL) 15 MG/ML injection 15 mg (15 mg Intravenous Given 09/22/21 1621)  potassium chloride SA (KLOR-CON M) CR tablet 40 mEq (40 mEq Oral Given 09/22/21 1638)    ED Course/ Medical Decision Making/ A&P                           Medical Decision Making Pt complains of a headache and pain in her chest   Amount and/or Complexity of Data Reviewed External Data Reviewed: notes.    Details: Ct angio chest  no pe from 5/17. Labs: ordered. Decision-making details documented in ED Course.    Details: Labs ordered reviewed and interpreted patient has a potassium of 3.0 troponin is negative x2 Radiology: ordered. ECG/medicine tests: ordered and independent interpretation performed. Decision-making details documented in ED Course.    Details: EKG is reviewed by Dr. Sabra Heck and shows no acute changes  Risk Prescription drug management. Risk Details: Patient is given potassium 40 mEq p.o.  Patient plaint of persistent head pain and chest pain I ordered hydrocodone 2 tablets however patient left AMA before receiving.  Patient's nurse reports patient's child was involved in a car accident and she had to leave emergently.  Did not have the opportunity to take with patient before she left patient's  troponins being negative or reassuring.  I suspect patient has a migraine headache.  Patient would probably benefit from referral to neurology for further evaluation           Final Clinical Impression(s) / ED Diagnoses Final diagnoses:  Nonintractable headache, unspecified chronicity pattern, unspecified headache type  Atypical chest pain    Rx / DC Orders ED Discharge Orders          Ordered    potassium chloride SA (KLOR-CON M) 20 MEQ tablet  2 times daily        09/22/21 1634           AMA   Fransico Meadow, PA-C 75/[id number]    Lianne Cure, DO 59/93/57 [id number]

## 2021-09-22 NOTE — ED Triage Notes (Signed)
Pt having chest pain, migraine, left arm tingling, seen her for same on 09/09/21, d-dimer evaluated at that time, CTA negative for PE.

## 2021-09-28 ENCOUNTER — Emergency Department (HOSPITAL_BASED_OUTPATIENT_CLINIC_OR_DEPARTMENT_OTHER)
Admission: EM | Admit: 2021-09-28 | Discharge: 2021-09-29 | Disposition: A | Discharge status: Home / Self Care | Payer: Medicaid Other | Attending: Emergency Medicine | Admitting: Emergency Medicine

## 2021-09-28 ENCOUNTER — Encounter (HOSPITAL_BASED_OUTPATIENT_CLINIC_OR_DEPARTMENT_OTHER): Payer: Self-pay

## 2021-09-28 ENCOUNTER — Emergency Department (HOSPITAL_BASED_OUTPATIENT_CLINIC_OR_DEPARTMENT_OTHER): Payer: Medicaid Other | Admitting: Radiology

## 2021-09-28 ENCOUNTER — Other Ambulatory Visit: Payer: Self-pay

## 2021-09-28 DIAGNOSIS — G8929 Other chronic pain: Secondary | ICD-10-CM | POA: Insufficient documentation

## 2021-09-28 DIAGNOSIS — S0990XA Unspecified injury of head, initial encounter: Secondary | ICD-10-CM | POA: Diagnosis present

## 2021-09-28 DIAGNOSIS — R079 Chest pain, unspecified: Secondary | ICD-10-CM | POA: Insufficient documentation

## 2021-09-28 DIAGNOSIS — W01198A Fall on same level from slipping, tripping and stumbling with subsequent striking against other object, initial encounter: Secondary | ICD-10-CM | POA: Diagnosis not present

## 2021-09-28 LAB — BASIC METABOLIC PANEL
Anion gap: 10 (ref 5–15)
BUN: 12 mg/dL (ref 6–20)
CO2: 28 mmol/L (ref 22–32)
Calcium: 9.5 mg/dL (ref 8.9–10.3)
Chloride: 100 mmol/L (ref 98–111)
Creatinine, Ser: 0.88 mg/dL (ref 0.44–1.00)
GFR, Estimated: >60 mL/min (ref >60)
Glucose, Bld: 131 mg/dL — ABNORMAL HIGH (ref 70–99)
Potassium: 3.5 mmol/L (ref 3.5–5.1)
Sodium: 138 mmol/L (ref 135–145)

## 2021-09-28 LAB — CBC
HCT: 41.1 % (ref 36.0–46.0)
Hemoglobin: 12.8 g/dL (ref 12.0–15.0)
MCH: 26.3 pg (ref 26.0–34.0)
MCHC: 31.1 g/dL (ref 30.0–36.0)
MCV: 84.4 fL (ref 80.0–100.0)
Platelets: 243 10*3/uL (ref 150–400)
RBC: 4.87 MIL/uL (ref 3.87–5.11)
RDW: 14 % (ref 11.5–15.5)
WBC: 9.1 10*3/uL (ref 4.0–10.5)
nRBC: 0 % (ref 0.0–0.2)

## 2021-09-28 LAB — TROPONIN I (HIGH SENSITIVITY): Troponin I (High Sensitivity): 4 ng/L (ref <18)

## 2021-09-28 NOTE — ED Triage Notes (Signed)
L sided CP radiating down arm with tingling in fingers x2 weeks. Has been seen multiple times for same. Pt also complaining of a fall yesterday where she hit the back of her neck and now is having headaches.

## 2021-09-29 ENCOUNTER — Emergency Department (HOSPITAL_BASED_OUTPATIENT_CLINIC_OR_DEPARTMENT_OTHER): Payer: Medicaid Other

## 2021-09-29 LAB — PREGNANCY, URINE: Preg Test, Ur: NEGATIVE

## 2021-09-29 MED ORDER — ACETAMINOPHEN 325 MG PO TABS
650.0000 mg | ORAL_TABLET | Freq: Once | ORAL | Status: AC
  Administered 2021-09-29: 650 mg via ORAL
  Filled 2021-09-29: qty 2

## 2021-09-29 MED ORDER — PROMETHAZINE HCL 25 MG PO TABS: 25.0000 mg | ORAL_TABLET | Freq: Four times a day (QID) | ORAL | 0 refills | Status: DC | PRN

## 2021-09-29 NOTE — ED Notes (Signed)
Pt now oob ambulating with slow but steady gait to bathroom -- pt has urine cup for U-preg

## 2021-09-29 NOTE — ED Notes (Signed)
Pt called this nurse to room as nurse was preparing to enter for d/c instructions-- upon entering room pt expressed fustration that she had waited a long time to be seen and treated and pt felt as if her pain and nausea were not properly addressed-- educated pt that per Dr Karle Starch, she was not going to ordered any sedating meds due to not having an adult driver -- pt reports only have 31 year old daughter as driver -- pt was giving tylenol during visit and zofran was listed as allergy - -did review d/c instructions which included prescription for phenergan for pt to be able to take at home -- after verbally reviewing d/c instructions pt requested and provided work/school notes for her and daughter- no additional questions, concerns, needs verbalized-- pt ambulatory independently at this time; gait steady; no obvious acute distress noted.

## 2021-09-29 NOTE — ED Provider Notes (Signed)
[facility]  Provider Note  CSN: [id number] Arrival date & time: 09/28/21 1823  History Chief Complaint  Patient presents with   Headache    Janice Taylor is a 31 y.o. female with history of chest pains and headaches associated with anxiety and panic attacks also reports she fell yesterday hitting her head. She has had persistent severe headache and vomiting since then which is different from her usual headaches. She has also continued to have chest pain but this is not different from usual and has been worked up in the ED several times recently.    Home Medications Prior to Admission medications   Medication Sig Start Date End Date Taking? Authorizing Provider  ALPRAZolam Duanne Moron) 1 MG tablet Take 1 mg by mouth 3 (three) times daily as needed. 11/21/20   [provider]  amLODipine (NORVASC) 10 MG tablet Take 10 mg by mouth daily. 02/14/20   [provider]  amoxicillin (AMOXIL) 500 MG tablet Take 500 mg by mouth 2 (two) times daily.    [provider]  cyclobenzaprine (FLEXERIL) 10 MG tablet TAKE 1 TABLET EVERY 8 HOURS AS NEEDED FOR MUSCLE SPASMS. 05/07/21   Florian Buff, MD  dicyclomine (BENTYL) 20 MG tablet Take 20 mg by mouth 4 (four) times daily. 11/20/20   [provider]  escitalopram (LEXAPRO) 10 MG tablet  09/17/20   [provider]  hydrochlorothiazide (HYDRODIURIL) 25 MG tablet Take 25 mg by mouth daily.    [provider]  HYDROcodone-acetaminophen (NORCO/VICODIN) 5-325 MG tablet Take 1 tablet by mouth every 4 (four) hours as needed. 04/17/21   Isla Pence, MD  hydrocortisone (ANUSOL-HC) 25 MG suppository Place 1 suppository (25 mg total) rectally 2 (two) times daily. 12/03/20   Estill Dooms, NP  ibuprofen (ADVIL) 800 MG tablet Take 800 mg by mouth every 8 (eight) hours as needed.    [provider]  Levonorgestrel (KYLEENA IU) by Intrauterine route.    [provider]   lidocaine (XYLOCAINE) 5 % ointment Apply topically 4 (four) times daily as needed. 12/03/20   Estill Dooms, NP  metFORMIN (GLUCOPHAGE) 500 MG tablet Take 1 tablet (500 mg total) by mouth 2 (two) times daily with a meal. 04/17/21   Isla Pence, MD  oxyCODONE (ROXICODONE) 5 MG immediate release tablet Take 1 tablet (5 mg total) by mouth every 4 (four) hours as needed for severe pain. 01/25/21   Maudie Flakes, MD  potassium chloride SA (KLOR-CON M) 20 MEQ tablet Take 1 tablet (20 mEq total) by mouth 2 (two) times daily. 09/22/21   Fransico Meadow, PA-C  pramoxine-hydrocortisone (PROCTOCREAM-HC) 1-1 % rectal cream Place 1 application rectally 2 (two) times daily. 12/03/20   Estill Dooms, NP  promethazine (PHENERGAN) 25 MG tablet Take 1 tablet (25 mg total) by mouth every 6 (six) hours as needed for nausea or vomiting. 09/29/21   Truddie Hidden, MD  valACYclovir (VALTREX) 1000 MG tablet Take 1 tablet (1,000 mg total) by mouth daily. 08/15/20   Florian Buff, MD  zolpidem (AMBIEN) 10 MG tablet Take 1 tablet (10 mg total) by mouth at bedtime. 07/21/20   Cory Munch, PA-C     Allergies    Ondansetron hcl and Fentanyl   Review of Systems   Review of Systems Please see HPI for pertinent positives and negatives  Physical Exam BP 133/82   Pulse 95   Temp 98.2 F (36.8 C) (Oral)   Resp  16   Ht '5\' 4"'$  (1.626 m)   Wt (!) 154.2 kg   SpO2 98%   BMI 58.36 kg/m   Physical Exam Vitals and nursing note reviewed.  Constitutional:      Appearance: Normal appearance. She is obese.  HENT:     Head: Normocephalic and atraumatic.     Nose: Nose normal.     Mouth/Throat:     Mouth: Mucous membranes are moist.  Eyes:     Extraocular Movements: Extraocular movements intact.     Conjunctiva/sclera: Conjunctivae normal.  Cardiovascular:     Rate and Rhythm: Normal rate.  Pulmonary:     Effort: Pulmonary effort is normal.     Breath sounds: Normal breath sounds.  Abdominal:      General: Abdomen is flat.     Palpations: Abdomen is soft.     Tenderness: There is no abdominal tenderness.  Musculoskeletal:        General: No swelling. Normal range of motion.     Cervical back: Neck supple.  Skin:    General: Skin is warm and dry.  Neurological:     General: No focal deficit present.     Mental Status: She is alert.  Psychiatric:        Mood and Affect: Mood normal.    ED Results / Procedures / Treatments   EKG EKG Interpretation  Date/Time:  Monday September 28 2021 18:44:09 EDT Ventricular Rate:  114 PR Interval:  126 QRS Duration: 92 QT Interval:  322 QTC Calculation: 443 R Axis:   111 Text Interpretation: Sinus tachycardia Left posterior fascicular block ST & T wave abnormality, consider inferolateral ischemia Abnormal ECG When compared with ECG of 22-Sep-2021 12:43, No significant change was found Confirmed by Calvert Cantor [phone]) on 09/29/2021 12:10:21 AM  Procedures Procedures  Medications Ordered in the ED Medications  acetaminophen (TYLENOL) tablet 650 mg (650 mg Oral Given 09/29/21 0016)    Initial Impression and Plan  Patient here with continued chest pains, not changed from several recent ED visits. Also having headache worse than usual after falling and hitting her head yesterday. She reports she has been vomiting but cannot take Zofran. Labs done in triage show normal CBC, BMP and Trop. I personally viewed the images from radiology studies and agree with radiologist interpretation: CXR is clear. Will give APAP for headache and send for CT to rule out intracranial injury given her persisent symptoms.    ED Course   Clinical Course as of 09/29/21 0131  Tue Sep 29, 2021  0126 I personally viewed the images from radiology studies and agree with radiologist interpretation: CT is negative. Patient continues to rest comfortably. Still having headache, no vomiting since arrival to ED exam room. She has Rx for phenergan at home but requesting a  refill. Declines suppository Rx. Advised to follow up with her PCP for ongoing management of her other chronic conditions.   [CS]    Clinical Course User Index [CS] Truddie Hidden, MD     MDM Rules/Calculators/A&P Medical Decision Making Problems Addressed: Chronic chest pain: chronic illness or injury with exacerbation, progression, or side effects of treatment Chronic nonintractable headache, unspecified headache type: chronic illness or injury with exacerbation, progression, or side effects of treatment Injury of head, initial encounter: acute illness or injury  Amount and/or Complexity of Data Reviewed Labs: ordered. Decision-making details documented in ED Course. Radiology: ordered and independent interpretation performed. Decision-making details documented in ED Course. ECG/medicine tests: ordered and independent  interpretation performed. Decision-making details documented in ED Course.  Risk OTC drugs. Prescription drug management.    Final Clinical Impression(s) / ED Diagnoses Final diagnoses:  Injury of head, initial encounter  Chronic chest pain  Chronic nonintractable headache, unspecified headache type    Rx / DC Orders ED Discharge Orders          Ordered    promethazine (PHENERGAN) 25 MG tablet  Every 6 hours PRN        09/29/21 0131             Truddie Hidden, MD 09/29/21 [phone]

## 2021-10-03 ENCOUNTER — Encounter (HOSPITAL_COMMUNITY): Payer: Self-pay | Admitting: *Deleted

## 2021-10-03 ENCOUNTER — Emergency Department (HOSPITAL_COMMUNITY)
Admission: EM | Admit: 2021-10-03 | Discharge: 2021-10-03 | Disposition: A | Discharge status: Home / Self Care | Payer: Medicaid Other | Attending: Emergency Medicine | Admitting: Emergency Medicine

## 2021-10-03 ENCOUNTER — Other Ambulatory Visit: Payer: Self-pay

## 2021-10-03 ENCOUNTER — Emergency Department (HOSPITAL_COMMUNITY): Payer: Medicaid Other

## 2021-10-03 DIAGNOSIS — R197 Diarrhea, unspecified: Secondary | ICD-10-CM | POA: Diagnosis not present

## 2021-10-03 DIAGNOSIS — R1030 Lower abdominal pain, unspecified: Secondary | ICD-10-CM | POA: Diagnosis present

## 2021-10-03 DIAGNOSIS — Z79899 Other long term (current) drug therapy: Secondary | ICD-10-CM | POA: Diagnosis not present

## 2021-10-03 DIAGNOSIS — N710 Acute inflammatory disease of uterus: Secondary | ICD-10-CM | POA: Insufficient documentation

## 2021-10-03 DIAGNOSIS — K5900 Constipation, unspecified: Secondary | ICD-10-CM | POA: Diagnosis not present

## 2021-10-03 DIAGNOSIS — N719 Inflammatory disease of uterus, unspecified: Secondary | ICD-10-CM

## 2021-10-03 HISTORY — DX: Inflammatory disease of cervix uteri: N72

## 2021-10-03 HISTORY — DX: Endometriosis, unspecified: N80.9

## 2021-10-03 LAB — CBC WITH DIFFERENTIAL/PLATELET
Abs Immature Granulocytes: 0.04 10*3/uL (ref 0.00–0.07)
Basophils Absolute: 0 10*3/uL (ref 0.0–0.1)
Basophils Relative: 0 %
Eosinophils Absolute: 0.1 10*3/uL (ref 0.0–0.5)
Eosinophils Relative: 1 %
HCT: 38.5 % (ref 36.0–46.0)
Hemoglobin: 12.2 g/dL (ref 12.0–15.0)
Immature Granulocytes: 1 %
Lymphocytes Relative: 23 %
Lymphs Abs: 1.9 10*3/uL (ref 0.7–4.0)
MCH: 26.6 pg (ref 26.0–34.0)
MCHC: 31.7 g/dL (ref 30.0–36.0)
MCV: 83.9 fL (ref 80.0–100.0)
Monocytes Absolute: 0.4 10*3/uL (ref 0.1–1.0)
Monocytes Relative: 4 %
Neutro Abs: 5.7 10*3/uL (ref 1.7–7.7)
Neutrophils Relative %: 71 %
Platelets: 241 10*3/uL (ref 150–400)
RBC: 4.59 MIL/uL (ref 3.87–5.11)
RDW: 14 % (ref 11.5–15.5)
WBC: 8 10*3/uL (ref 4.0–10.5)
nRBC: 0 % (ref 0.0–0.2)

## 2021-10-03 LAB — URINALYSIS, ROUTINE W REFLEX MICROSCOPIC
Bacteria, UA: NONE SEEN
Bilirubin Urine: NEGATIVE
Glucose, UA: NEGATIVE mg/dL
Ketones, ur: NEGATIVE mg/dL
Nitrite: NEGATIVE
Protein, ur: NEGATIVE mg/dL
Specific Gravity, Urine: 1.016 (ref 1.005–1.030)
pH: 7 (ref 5.0–8.0)

## 2021-10-03 LAB — BASIC METABOLIC PANEL
Anion gap: 8 (ref 5–15)
BUN: 11 mg/dL (ref 6–20)
CO2: 25 mmol/L (ref 22–32)
Calcium: 8.4 mg/dL — ABNORMAL LOW (ref 8.9–10.3)
Chloride: 104 mmol/L (ref 98–111)
Creatinine, Ser: 0.83 mg/dL (ref 0.44–1.00)
GFR, Estimated: >60 mL/min (ref >60)
Glucose, Bld: 90 mg/dL (ref 70–99)
Potassium: 3.3 mmol/L — ABNORMAL LOW (ref 3.5–5.1)
Sodium: 137 mmol/L (ref 135–145)

## 2021-10-03 LAB — PREGNANCY, URINE: Preg Test, Ur: NEGATIVE

## 2021-10-03 MED ORDER — OXYCODONE-ACETAMINOPHEN 5-325 MG PO TABS: 1.0000 | ORAL_TABLET | Freq: Four times a day (QID) | ORAL | 0 refills | Status: DC | PRN

## 2021-10-03 MED ORDER — LIDOCAINE HCL (PF) 1 % IJ SOLN
1.0000 mL | Freq: Once | INTRAMUSCULAR | Status: AC
  Administered 2021-10-03: 1.2 mL

## 2021-10-03 MED ORDER — IOHEXOL 300 MG/ML  SOLN
100.0000 mL | Freq: Once | INTRAMUSCULAR | Status: AC | PRN
Start: 2021-10-03 — End: 2021-10-03
  Administered 2021-10-03: 100 mL via INTRAVENOUS

## 2021-10-03 MED ORDER — CEFTRIAXONE SODIUM 1 G IJ SOLR
1.0000 g | Freq: Once | INTRAMUSCULAR | Status: AC
  Administered 2021-10-03: 1 g via INTRAMUSCULAR
  Filled 2021-10-03: qty 10

## 2021-10-03 MED ORDER — METRONIDAZOLE 500 MG PO TABS: 500.0000 mg | ORAL_TABLET | Freq: Two times a day (BID) | ORAL | 0 refills | Status: DC

## 2021-10-03 MED ORDER — KETOROLAC TROMETHAMINE 15 MG/ML IJ SOLN
15.0000 mg | Freq: Once | INTRAMUSCULAR | Status: AC
  Administered 2021-10-03: 15 mg via INTRAVENOUS
  Filled 2021-10-03: qty 1

## 2021-10-03 MED ORDER — LEVOFLOXACIN 500 MG PO TABS: 500.0000 mg | ORAL_TABLET | Freq: Every day | ORAL | 0 refills | Status: DC

## 2021-10-03 MED ORDER — HYDROMORPHONE HCL 1 MG/ML IJ SOLN
0.5000 mg | Freq: Once | INTRAMUSCULAR | Status: AC
  Administered 2021-10-03: 0.5 mg via INTRAVENOUS
  Filled 2021-10-03: qty 0.5

## 2021-10-03 MED ORDER — PROMETHAZINE HCL 12.5 MG PO TABS
25.0000 mg | ORAL_TABLET | Freq: Once | ORAL | Status: AC
Start: 2021-10-03 — End: 2021-10-03
  Administered 2021-10-03: 25 mg via ORAL
  Filled 2021-10-03: qty 2

## 2021-10-03 NOTE — Discharge Instructions (Addendum)
Stop the doxycycline.  Take the Levaquin and metronidazole.  Follow-up with your gynecologist who can follow the cultures.  Let her know you were in the ER.

## 2021-10-03 NOTE — ED Provider Notes (Signed)
[facility] EMERGENCY DEPARTMENT Provider Note   CSN: [id number] Arrival date & time: 10/03/21  1455     History  Chief Complaint  Patient presents with   Abdominal Pain    Janice Taylor is a 31 y.o. female.   Abdominal Pain Patient presents with lower abdominal/pelvic pain.  Has had some vaginal discharge and constipation followed by diarrhea.  States yellow to greenish discharge.  Gets seen at patient's OB/GYN on Thursday with today being Saturday.  Reportedly had pelvic exam and culture sent.  Reportedly had discharge.  Reported diagnosed with sounds like cervicitis and potentially endometritis.  Patient had IUD placed a month ago and per patient was difficult placement.  Denies pregnancy.  Patient's was started on doxycycline but states she has had nausea and vomiting with the medicine     Home Medications Prior to Admission medications   Medication Sig Start Date End Date Taking? Authorizing Provider  levofloxacin (LEVAQUIN) 500 MG tablet Take 1 tablet (500 mg total) by mouth daily. 10/03/21  Yes Davonna Belling, MD  metroNIDAZOLE (FLAGYL) 500 MG tablet Take 1 tablet (500 mg total) by mouth 2 (two) times daily. 10/03/21  Yes Davonna Belling, MD  oxyCODONE-acetaminophen (PERCOCET/ROXICET) 5-325 MG tablet Take 1 tablet by mouth every 6 (six) hours as needed for severe pain. 10/03/21  Yes Davonna Belling, MD  ALPRAZolam Duanne Moron) 1 MG tablet Take 1 mg by mouth 3 (three) times daily as needed. 11/21/20   [provider]  amLODipine (NORVASC) 10 MG tablet Take 10 mg by mouth daily. 02/14/20   [provider]  cyclobenzaprine (FLEXERIL) 10 MG tablet TAKE 1 TABLET EVERY 8 HOURS AS NEEDED FOR MUSCLE SPASMS. 05/07/21   Florian Buff, MD  dicyclomine (BENTYL) 20 MG tablet Take 20 mg by mouth 4 (four) times daily. 11/20/20   [provider]  escitalopram (LEXAPRO) 10 MG tablet  09/17/20   [provider]  hydrochlorothiazide (HYDRODIURIL) 25 MG tablet  Take 25 mg by mouth daily.    [provider]  hydrocortisone (ANUSOL-HC) 25 MG suppository Place 1 suppository (25 mg total) rectally 2 (two) times daily. 12/03/20   Estill Dooms, NP  ibuprofen (ADVIL) 800 MG tablet Take 800 mg by mouth every 8 (eight) hours as needed.    [provider]  Levonorgestrel (KYLEENA IU) by Intrauterine route.    [provider]  lidocaine (XYLOCAINE) 5 % ointment Apply topically 4 (four) times daily as needed. 12/03/20   Estill Dooms, NP  metFORMIN (GLUCOPHAGE) 500 MG tablet Take 1 tablet (500 mg total) by mouth 2 (two) times daily with a meal. 04/17/21   Isla Pence, MD  potassium chloride SA (KLOR-CON M) 20 MEQ tablet Take 1 tablet (20 mEq total) by mouth 2 (two) times daily. 09/22/21   Fransico Meadow, PA-C  pramoxine-hydrocortisone (PROCTOCREAM-HC) 1-1 % rectal cream Place 1 application rectally 2 (two) times daily. 12/03/20   Estill Dooms, NP  promethazine (PHENERGAN) 25 MG tablet Take 1 tablet (25 mg total) by mouth every 6 (six) hours as needed for nausea or vomiting. 09/29/21   Truddie Hidden, MD  valACYclovir (VALTREX) 1000 MG tablet Take 1 tablet (1,000 mg total) by mouth daily. 08/15/20   Florian Buff, MD  zolpidem (AMBIEN) 10 MG tablet Take 1 tablet (10 mg total) by mouth at bedtime. 07/21/20   Cory Munch, PA-C      Allergies    Penicillins, Ondansetron hcl, and Fentanyl  Review of Systems   Review of Systems  Gastrointestinal:  Positive for abdominal pain.    Physical Exam Updated Vital Signs BP (!) 145/82   Pulse 100   Temp 98.5 F (36.9 C)   Resp 20   Ht '5\' 4"'$  (1.626 m)   Wt (!) 154.7 kg   SpO2 97%   BMI 58.53 kg/m  Physical Exam Vitals and nursing note reviewed.  Cardiovascular:     Rate and Rhythm: Normal rate.  Abdominal:     Hernia: No hernia is present.     Comments: Mild lower abdominal tenderness without rebound or guarding.  No hernia palpated.  Skin:    General:  Skin is warm.  Neurological:     Mental Status: She is alert.     ED Results / Procedures / Treatments   Labs (all labs ordered are listed, but only abnormal results are displayed) Labs Reviewed  BASIC METABOLIC PANEL - Abnormal; Notable for the following components:      Result Value   Potassium 3.3 (*)    Calcium 8.4 (*)    All other components within normal limits  URINALYSIS, ROUTINE W REFLEX MICROSCOPIC - Abnormal; Notable for the following components:   Hgb urine dipstick LARGE (*)    Leukocytes,Ua LARGE (*)    All other components within normal limits  CBC WITH DIFFERENTIAL/PLATELET  PREGNANCY, URINE    EKG None  Radiology CT ABDOMEN PELVIS W CONTRAST  Result Date: 10/03/2021 CLINICAL DATA:  Pelvic pain, negative beta-HCG, gyn etiology suspected. Lower abdominal pain, lower back pain, diarrhea, vaginal discharge. EXAM: CT ABDOMEN AND PELVIS WITH CONTRAST TECHNIQUE: Multidetector CT imaging of the abdomen and pelvis was performed using the standard protocol following bolus administration of intravenous contrast. RADIATION DOSE REDUCTION: This exam was performed according to the departmental dose-optimization program which includes automated exposure control, adjustment of the mA and/or kV according to patient size and/or use of iterative reconstruction technique. CONTRAST:  167m OMNIPAQUE IOHEXOL 300 MG/ML  SOLN COMPARISON:  04/17/2021 FINDINGS: Lower chest: No acute abnormality. Hepatobiliary: No focal liver abnormality is seen. No gallstones, gallbladder wall thickening, or biliary dilatation. Pancreas: Unremarkable Spleen: The spleen is moderately enlarged measuring 16.4 cm in greatest dimension. This appears stable in size remote prior examination of 02/27/2008. No intrasplenic lesions are seen. The splenic vein is patent. Adrenals/Urinary Tract: The adrenal glands are unremarkable. The right kidney appears looped mildly malrotated with the long axis within the AP dimension.  The kidneys are normal in size. Multiple nonobstructing calculi are seen within the kidneys bilaterally measuring up to 4 mm. No hydronephrosis. No ureteral calculi. No enhancing intrarenal mass. The bladder is unremarkable. Stomach/Bowel: Stomach is within normal limits. Appendix appears normal. No evidence of bowel wall thickening, distention, or inflammatory changes. No free intraperitoneal gas or fluid. Vascular/Lymphatic: No significant vascular findings are present. No enlarged abdominal or pelvic lymph nodes. Reproductive: Intrauterine device in expected position. The pelvic organs are otherwise unremarkable. Other: Tiny fat containing umbilical hernia. The rectum is unremarkable. Musculoskeletal: No acute or significant osseous findings. IMPRESSION: No acute intra-abdominal pathology identified. No definite radiographic explanation for the patient's reported symptoms. Moderate splenomegaly, stable since remote prior examination of 02/27/2008. Mild bilateral nonobstructing nephrolithiasis. No hydronephrosis. No urolithiasis. Electronically Signed   By: AFidela SalisburyM.D.   On: 10/03/2021 18:28    Procedures Procedures    Medications Ordered in ED Medications  cefTRIAXone (ROCEPHIN) injection 1 g (has no administration in time range)  lidocaine (PF) (XYLOCAINE)  1 % injection 1-2.1 mL (has no administration in time range)  ketorolac (TORADOL) 15 MG/ML injection 15 mg (15 mg Intravenous Given 10/03/21 1722)  iohexol (OMNIPAQUE) 300 MG/ML solution 100 mL (100 mLs Intravenous Contrast Given 10/03/21 1759)  HYDROmorphone (DILAUDID) injection 0.5 mg (0.5 mg Intravenous Given 10/03/21 1850)    ED Course/ Medical Decision Making/ A&P                           Medical Decision Making Amount and/or Complexity of Data Reviewed Labs: ordered. Radiology: ordered.  Risk Prescription drug management.   Patient presents with pelvic pain.  Also goes to the back.  Reportedly has vaginal discharge.   Discussed with patient appears that she was diagnosed with cervicitis potential endometritis.  Started on doxycycline but did not tolerate due to nausea and vomiting.  Reportedly had an elevated white count.  Reportedly had cultures done but not have access to this.  Mild abdominal tenderness.  However does have recent placement of IUD.  CT scan done and reassuring without clear cause of pain.  Does have kidney stones but no ureteral stones I think it is unlikely the cause of the pain.  Discussed with pharmacist and will treat with Levaquin and Flagyl since she cannot tolerate the doxycycline.  However will give dose of Rocephin here to.  Can follow-up with GYN.  Do not think we need admission to the hospital right now.  CT scan done did not show adnexal mass or swelling.  Doubt tubo-ovarian abscess.  Patient is not pregnant.  Appears stable for discharge home        Final Clinical Impression(s) / ED Diagnoses Final diagnoses:  Endometritis    Rx / DC Orders ED Discharge Orders          Ordered    oxyCODONE-acetaminophen (PERCOCET/ROXICET) 5-325 MG tablet  Every 6 hours PRN        10/03/21 1904    levofloxacin (LEVAQUIN) 500 MG tablet  Daily        10/03/21 1905    metroNIDAZOLE (FLAGYL) 500 MG tablet  2 times daily        10/03/21 Nyra Capes, MD 10/03/21 1921

## 2021-10-03 NOTE — ED Triage Notes (Signed)
Patient c/o mid to lower back pain with mid to lower abd pain. Per patient diarrhea and green vaginal discharge. Patient seen by OB/GYN Thursday for the vaginal discharge and diagnosed with infection of cervix and endometriosis. Patient placed on doxycycline but states she has nausea and vomiting when she takes it.

## 2021-10-05 ENCOUNTER — Ambulatory Visit: Payer: Medicaid Other | Admitting: Advanced Practice Midwife

## 2021-10-07 ENCOUNTER — Other Ambulatory Visit: Payer: Self-pay

## 2021-10-07 ENCOUNTER — Emergency Department (HOSPITAL_COMMUNITY)
Admission: EM | Admit: 2021-10-07 | Discharge: 2021-10-07 | Disposition: A | Discharge status: Home / Self Care | Payer: Medicaid Other | Attending: Emergency Medicine | Admitting: Emergency Medicine

## 2021-10-07 ENCOUNTER — Encounter (HOSPITAL_COMMUNITY): Payer: Self-pay | Admitting: Emergency Medicine

## 2021-10-07 DIAGNOSIS — Z79899 Other long term (current) drug therapy: Secondary | ICD-10-CM | POA: Diagnosis not present

## 2021-10-07 DIAGNOSIS — R39198 Other difficulties with micturition: Secondary | ICD-10-CM | POA: Insufficient documentation

## 2021-10-07 DIAGNOSIS — R34 Anuria and oliguria: Secondary | ICD-10-CM

## 2021-10-07 DIAGNOSIS — R339 Retention of urine, unspecified: Secondary | ICD-10-CM | POA: Diagnosis present

## 2021-10-07 LAB — URINALYSIS, ROUTINE W REFLEX MICROSCOPIC
Bacteria, UA: NONE SEEN
Bilirubin Urine: NEGATIVE
Glucose, UA: NEGATIVE mg/dL
Hgb urine dipstick: NEGATIVE
Ketones, ur: NEGATIVE mg/dL
Nitrite: NEGATIVE
Protein, ur: NEGATIVE mg/dL
Specific Gravity, Urine: 1.021 (ref 1.005–1.030)
pH: 5 (ref 5.0–8.0)

## 2021-10-07 LAB — CBC WITH DIFFERENTIAL/PLATELET
Abs Immature Granulocytes: 0.08 10*3/uL — ABNORMAL HIGH (ref 0.00–0.07)
Basophils Absolute: 0 10*3/uL (ref 0.0–0.1)
Basophils Relative: 0 %
Eosinophils Absolute: 0.1 10*3/uL (ref 0.0–0.5)
Eosinophils Relative: 1 %
HCT: 40.4 % (ref 36.0–46.0)
Hemoglobin: 12.7 g/dL (ref 12.0–15.0)
Immature Granulocytes: 1 %
Lymphocytes Relative: 26 %
Lymphs Abs: 2 10*3/uL (ref 0.7–4.0)
MCH: 26.6 pg (ref 26.0–34.0)
MCHC: 31.4 g/dL (ref 30.0–36.0)
MCV: 84.7 fL (ref 80.0–100.0)
Monocytes Absolute: 0.4 10*3/uL (ref 0.1–1.0)
Monocytes Relative: 6 %
Neutro Abs: 5.2 10*3/uL (ref 1.7–7.7)
Neutrophils Relative %: 66 %
Platelets: 248 10*3/uL (ref 150–400)
RBC: 4.77 MIL/uL (ref 3.87–5.11)
RDW: 14 % (ref 11.5–15.5)
WBC: 7.8 10*3/uL (ref 4.0–10.5)
nRBC: 0 % (ref 0.0–0.2)

## 2021-10-07 LAB — BASIC METABOLIC PANEL
Anion gap: 8 (ref 5–15)
BUN: 22 mg/dL — ABNORMAL HIGH (ref 6–20)
CO2: 27 mmol/L (ref 22–32)
Calcium: 8.9 mg/dL (ref 8.9–10.3)
Chloride: 102 mmol/L (ref 98–111)
Creatinine, Ser: 1 mg/dL (ref 0.44–1.00)
GFR, Estimated: >60 mL/min (ref >60)
Glucose, Bld: 104 mg/dL — ABNORMAL HIGH (ref 70–99)
Potassium: 3.3 mmol/L — ABNORMAL LOW (ref 3.5–5.1)
Sodium: 137 mmol/L (ref 135–145)

## 2021-10-07 MED ORDER — KETOROLAC TROMETHAMINE 30 MG/ML IJ SOLN
15.0000 mg | Freq: Once | INTRAMUSCULAR | Status: AC
  Administered 2021-10-07: 15 mg via INTRAVENOUS
  Filled 2021-10-07: qty 1

## 2021-10-07 MED ORDER — OXYCODONE-ACETAMINOPHEN 5-325 MG PO TABS
2.0000 | ORAL_TABLET | Freq: Once | ORAL | Status: AC
  Administered 2021-10-07: 2 via ORAL
  Filled 2021-10-07: qty 2

## 2021-10-07 MED ORDER — LACTATED RINGERS IV BOLUS
1000.0000 mL | Freq: Once | INTRAVENOUS | Status: AC
  Administered 2021-10-07: 1000 mL via INTRAVENOUS

## 2021-10-07 NOTE — ED Triage Notes (Signed)
Pt states she has not urinated since Tuesday morning.

## 2021-10-07 NOTE — ED Provider Notes (Signed)
[facility] Provider Note   CSN: [id number] Arrival date & time: 10/07/21  0344     History  Chief Complaint  Patient presents with   Urinary Retention    Janice Taylor is a 31 y.o. female.  31 yo F here with suprapubic pain and 'no urine since yesterday morning'. Recently dx with cervicitis. On abx. No back pain. No fevers. No GI symptoms.         Home Medications Prior to Admission medications   Medication Sig Start Date End Date Taking? Authorizing Provider  ALPRAZolam Duanne Moron) 1 MG tablet Take 1 mg by mouth 3 (three) times daily as needed. 11/21/20   [provider]  amLODipine (NORVASC) 10 MG tablet Take 10 mg by mouth daily. 02/14/20   [provider]  cyclobenzaprine (FLEXERIL) 10 MG tablet TAKE 1 TABLET EVERY 8 HOURS AS NEEDED FOR MUSCLE SPASMS. 05/07/21   Florian Buff, MD  dicyclomine (BENTYL) 20 MG tablet Take 20 mg by mouth 4 (four) times daily. 11/20/20   [provider]  escitalopram (LEXAPRO) 10 MG tablet  09/17/20   [provider]  hydrochlorothiazide (HYDRODIURIL) 25 MG tablet Take 25 mg by mouth daily.    [provider]  hydrocortisone (ANUSOL-HC) 25 MG suppository Place 1 suppository (25 mg total) rectally 2 (two) times daily. 12/03/20   Estill Dooms, NP  ibuprofen (ADVIL) 800 MG tablet Take 800 mg by mouth every 8 (eight) hours as needed.    [provider]  levofloxacin (LEVAQUIN) 500 MG tablet Take 1 tablet (500 mg total) by mouth daily. 10/03/21   Davonna Belling, MD  Levonorgestrel (KYLEENA IU) by Intrauterine route.    [provider]  lidocaine (XYLOCAINE) 5 % ointment Apply topically 4 (four) times daily as needed. 12/03/20   Estill Dooms, NP  metFORMIN (GLUCOPHAGE) 500 MG tablet Take 1 tablet (500 mg total) by mouth 2 (two) times daily with a meal. 04/17/21   Isla Pence, MD  metroNIDAZOLE (FLAGYL) 500 MG tablet Take 1 tablet (500 mg total) by mouth 2  (two) times daily. 10/03/21   Davonna Belling, MD  oxyCODONE-acetaminophen (PERCOCET/ROXICET) 5-325 MG tablet Take 1 tablet by mouth every 6 (six) hours as needed for severe pain. 10/03/21   Davonna Belling, MD  potassium chloride SA (KLOR-CON M) 20 MEQ tablet Take 1 tablet (20 mEq total) by mouth 2 (two) times daily. 09/22/21   Fransico Meadow, PA-C  pramoxine-hydrocortisone (PROCTOCREAM-HC) 1-1 % rectal cream Place 1 application rectally 2 (two) times daily. 12/03/20   Estill Dooms, NP  promethazine (PHENERGAN) 25 MG tablet Take 1 tablet (25 mg total) by mouth every 6 (six) hours as needed for nausea or vomiting. 09/29/21   Truddie Hidden, MD  valACYclovir (VALTREX) 1000 MG tablet Take 1 tablet (1,000 mg total) by mouth daily. 08/15/20   Florian Buff, MD  zolpidem (AMBIEN) 10 MG tablet Take 1 tablet (10 mg total) by mouth at bedtime. 07/21/20   Cory Munch, PA-C      Allergies    Penicillins, Ondansetron hcl, and Fentanyl    Review of Systems   Review of Systems  Physical Exam Updated Vital Signs BP (!) 149/103   Pulse (!) 108   Temp 98.4 F (36.9 C) (Oral)   Resp 18   Ht '5\' 3"'$  (1.6 m)   Wt (!) 154.7 kg   SpO2 96%   BMI 60.41 kg/m  Physical Exam Vitals and nursing note  reviewed.  Constitutional:      Appearance: She is well-developed.  HENT:     Head: Normocephalic and atraumatic.     Mouth/Throat:     Mouth: Mucous membranes are moist.     Pharynx: Oropharynx is clear.  Eyes:     Pupils: Pupils are equal, round, and reactive to light.  Cardiovascular:     Rate and Rhythm: Normal rate and regular rhythm.  Pulmonary:     Effort: Pulmonary effort is normal. No respiratory distress.     Breath sounds: No stridor.  Abdominal:     General: Abdomen is flat. There is no distension.  Musculoskeletal:        General: No swelling or tenderness. Normal range of motion.     Cervical back: Normal range of motion.  Skin:    General: Skin is warm and dry.   Neurological:     Mental Status: She is alert.     ED Results / Procedures / Treatments   Labs (all labs ordered are listed, but only abnormal results are displayed) Labs Reviewed  BASIC METABOLIC PANEL - Abnormal; Notable for the following components:      Result Value   Potassium 3.3 (*)    Glucose, Bld 104 (*)    BUN 22 (*)    All other components within normal limits  CBC WITH DIFFERENTIAL/PLATELET - Abnormal; Notable for the following components:   Abs Immature Granulocytes 0.08 (*)    All other components within normal limits  URINALYSIS, ROUTINE W REFLEX MICROSCOPIC - Abnormal; Notable for the following components:   APPearance HAZY (*)    Leukocytes,Ua SMALL (*)    All other components within normal limits  URINE CULTURE    EKG None  Radiology No results found.  Procedures Procedures    Medications Ordered in ED Medications  oxyCODONE-acetaminophen (PERCOCET/ROXICET) 5-325 MG per tablet 2 tablet (has no administration in time range)  lactated ringers bolus 1,000 mL (1,000 mLs Intravenous New Bag/Given 10/07/21 0419)  ketorolac (TORADOL) 30 MG/ML injection 15 mg (15 mg Intravenous Given 10/07/21 0424)    ED Course/ Medical Decision Making/ A&P                           Medical Decision Making Amount and/or Complexity of Data Reviewed Labs: ordered.  Risk Prescription drug management.   Bladder scan around 284 but urinated quickly after arriving. Kidney function appropriate. No indication for further workup. PCP follow up.  Final Clinical Impression(s) / ED Diagnoses Final diagnoses:  Decreased urination    Rx / DC Orders ED Discharge Orders     None         Karlita Lichtman, Corene Cornea, MD 10/07/21 [phone]

## 2021-10-08 ENCOUNTER — Other Ambulatory Visit: Payer: Self-pay

## 2021-10-08 ENCOUNTER — Emergency Department (HOSPITAL_COMMUNITY)
Admission: EM | Admit: 2021-10-08 | Discharge: 2021-10-08 | Disposition: A | Discharge status: Home / Self Care | Payer: Medicaid Other | Attending: Emergency Medicine | Admitting: Emergency Medicine

## 2021-10-08 ENCOUNTER — Encounter (HOSPITAL_COMMUNITY): Payer: Self-pay | Admitting: *Deleted

## 2021-10-08 DIAGNOSIS — R339 Retention of urine, unspecified: Secondary | ICD-10-CM | POA: Diagnosis present

## 2021-10-08 DIAGNOSIS — R109 Unspecified abdominal pain: Secondary | ICD-10-CM

## 2021-10-08 DIAGNOSIS — K209 Esophagitis, unspecified without bleeding: Secondary | ICD-10-CM | POA: Diagnosis not present

## 2021-10-08 LAB — URINALYSIS, ROUTINE W REFLEX MICROSCOPIC
Bilirubin Urine: NEGATIVE
Glucose, UA: NEGATIVE mg/dL
Ketones, ur: NEGATIVE mg/dL
Nitrite: NEGATIVE
Protein, ur: NEGATIVE mg/dL
RBC / HPF: >50 RBC/hpf — ABNORMAL HIGH (ref 0–5)
Specific Gravity, Urine: 1.017 (ref 1.005–1.030)
pH: 6 (ref 5.0–8.0)

## 2021-10-08 LAB — BASIC METABOLIC PANEL
Anion gap: 7 (ref 5–15)
BUN: 17 mg/dL (ref 6–20)
CO2: 25 mmol/L (ref 22–32)
Calcium: 9.2 mg/dL (ref 8.9–10.3)
Chloride: 103 mmol/L (ref 98–111)
Creatinine, Ser: 0.97 mg/dL (ref 0.44–1.00)
GFR, Estimated: >60 mL/min (ref >60)
Glucose, Bld: 108 mg/dL — ABNORMAL HIGH (ref 70–99)
Potassium: 4.1 mmol/L (ref 3.5–5.1)
Sodium: 135 mmol/L (ref 135–145)

## 2021-10-08 LAB — WET PREP, GENITAL
Clue Cells Wet Prep HPF POC: NONE SEEN
Sperm: NONE SEEN
Trich, Wet Prep: NONE SEEN
WBC, Wet Prep HPF POC: <10 — AB (ref <10)
Yeast Wet Prep HPF POC: NONE SEEN

## 2021-10-08 LAB — URINE CULTURE: Culture: NO GROWTH

## 2021-10-08 LAB — CBC
HCT: 37.9 % (ref 36.0–46.0)
Hemoglobin: 12.1 g/dL (ref 12.0–15.0)
MCH: 26.8 pg (ref 26.0–34.0)
MCHC: 31.9 g/dL (ref 30.0–36.0)
MCV: 84 fL (ref 80.0–100.0)
Platelets: 243 10*3/uL (ref 150–400)
RBC: 4.51 MIL/uL (ref 3.87–5.11)
RDW: 14.1 % (ref 11.5–15.5)
WBC: 9.4 10*3/uL (ref 4.0–10.5)
nRBC: 0 % (ref 0.0–0.2)

## 2021-10-08 MED ORDER — PROMETHAZINE HCL 25 MG/ML IJ SOLN: 25.0000 mg | Freq: Four times a day (QID) | INTRAMUSCULAR | Status: DC | PRN

## 2021-10-08 MED ORDER — SODIUM CHLORIDE 0.9 % IV BOLUS
500.0000 mL | Freq: Once | INTRAVENOUS | Status: AC
  Administered 2021-10-08: 500 mL via INTRAVENOUS

## 2021-10-08 MED ORDER — HYDROMORPHONE HCL 1 MG/ML IJ SOLN: 1.0000 mg | Freq: Once | INTRAMUSCULAR | Status: DC

## 2021-10-08 MED ORDER — PROMETHAZINE HCL 25 MG/ML IJ SOLN
INTRAMUSCULAR | Status: AC
  Filled 2021-10-08: qty 1

## 2021-10-08 MED ORDER — PROMETHAZINE HCL 25 MG/ML IJ SOLN
12.5000 mg | Freq: Once | INTRAMUSCULAR | Status: AC
  Administered 2021-10-08: 12.5 mg via INTRAVENOUS
  Filled 2021-10-08: qty 0.5

## 2021-10-08 MED ORDER — HYDROMORPHONE HCL 1 MG/ML IJ SOLN
0.5000 mg | Freq: Once | INTRAMUSCULAR | Status: AC
  Administered 2021-10-08: 0.5 mg via INTRAVENOUS
  Filled 2021-10-08: qty 0.5

## 2021-10-08 MED ORDER — LIDOCAINE VISCOUS HCL 2 % MT SOLN: 5.0000 mL | Freq: Two times a day (BID) | OROMUCOSAL | 0 refills | Status: AC | PRN

## 2021-10-08 MED ORDER — PROMETHAZINE HCL 25 MG RE SUPP: 25.0000 mg | Freq: Three times a day (TID) | RECTAL | 0 refills | Status: DC | PRN

## 2021-10-08 NOTE — ED Notes (Signed)
Several  minutes spent with several staff members as well as edp regarding plan of care and dc papers.

## 2021-10-08 NOTE — ED Notes (Signed)
Called [facility] for phenergan

## 2021-10-08 NOTE — ED Triage Notes (Addendum)
Pt with urinary retention. Hx of same yesterday morning. States her doctor sent here. Pt states she has thrush currently from being on antibiotics frequently.

## 2021-10-08 NOTE — ED Provider Notes (Signed)
[facility] EMERGENCY DEPARTMENT Provider Note   CSN: [id number] Arrival date & time: 10/08/21  1553     History  Chief Complaint  Patient presents with   Urinary Retention    Janice Taylor is a 31 y.o. female presenting to ED with difficulty urinating, and painful swallowing.  The patient was seen in the ED several times the past 2 months, and has had extensive work-up, including CT scan of the abdomen 5 days ago on June 10, which had no significant findings noted, mild bilateral nonobstructing kidney stones.  She was seen in the ED again last night with complaint of difficulty with urination.  She had been started on treatment on June 10, 5 days ago, with Levaquin and Flagyl for possible cervicitis versus PID (she does not tolerate doxycycline).  She says that subsequently while taking his antibiotic she feels that she has developed thrush and painful swallowing.  Her PCP started her on Diflucan and nystatin mouthwash, which she has been using for about 2 to 3 days, but still has difficulty swallowing.  She is also prescribed oxycodone and Xanax through her doctor's office.  She states she has been calling her doctor's office repeatedly and was told to come to the ED, primarily because she claims she has not been able to urinate more than a dribble for the past 24 hours, that she may need a catheter at this point.  She says she cannot tolerate Magic mouthwash because it "makes me vomit".  I separately spoke to her physician Dr Redmond School who is her PCP, and he confirms to me that his primary concern referring her to the ED with urinary retention.  He believes that otherwise she would be stable for medical management as an outpatient.  HPI     Home Medications Prior to Admission medications   Medication Sig Start Date End Date Taking? Authorizing Provider  magic mouthwash (lidocaine, diphenhydrAMINE, alum & mag hydroxide) suspension Swish and swallow 5 mLs 2 (two) times daily as  needed for up to 10 days for mouth pain. Do NOT exceed recommended daily dose 10/08/21 10/18/21 Yes Jonny Dearden, Carola Rhine, MD  promethazine (PHENERGAN) 25 MG suppository Place 1 suppository (25 mg total) rectally every 8 (eight) hours as needed for up to 12 doses for nausea or vomiting. 10/08/21  Yes Ellee Wawrzyniak, Carola Rhine, MD  ALPRAZolam Duanne Moron) 1 MG tablet Take 1 mg by mouth 3 (three) times daily as needed. 11/21/20   [provider]  amLODipine (NORVASC) 10 MG tablet Take 10 mg by mouth daily. 02/14/20   [provider]  cyclobenzaprine (FLEXERIL) 10 MG tablet TAKE 1 TABLET EVERY 8 HOURS AS NEEDED FOR MUSCLE SPASMS. 05/07/21   Florian Buff, MD  dicyclomine (BENTYL) 20 MG tablet Take 20 mg by mouth 4 (four) times daily. 11/20/20   [provider]  escitalopram (LEXAPRO) 10 MG tablet  09/17/20   [provider]  hydrochlorothiazide (HYDRODIURIL) 25 MG tablet Take 25 mg by mouth daily.    [provider]  hydrocortisone (ANUSOL-HC) 25 MG suppository Place 1 suppository (25 mg total) rectally 2 (two) times daily. 12/03/20   Estill Dooms, NP  ibuprofen (ADVIL) 800 MG tablet Take 800 mg by mouth every 8 (eight) hours as needed.    [provider]  Levonorgestrel (KYLEENA IU) by Intrauterine route.    [provider]  lidocaine (XYLOCAINE) 5 % ointment Apply topically 4 (four) times daily as needed. 12/03/20   Estill Dooms,  NP  metFORMIN (GLUCOPHAGE) 500 MG tablet Take 1 tablet (500 mg total) by mouth 2 (two) times daily with a meal. 04/17/21   Isla Pence, MD  oxyCODONE-acetaminophen (PERCOCET/ROXICET) 5-325 MG tablet Take 1 tablet by mouth every 6 (six) hours as needed for severe pain. 10/03/21   Davonna Belling, MD  potassium chloride SA (KLOR-CON M) 20 MEQ tablet Take 1 tablet (20 mEq total) by mouth 2 (two) times daily. 09/22/21   Fransico Meadow, PA-C  pramoxine-hydrocortisone (PROCTOCREAM-HC) 1-1 % rectal cream Place 1 application  rectally 2 (two) times daily. 12/03/20   Estill Dooms, NP  promethazine (PHENERGAN) 25 MG tablet Take 1 tablet (25 mg total) by mouth every 6 (six) hours as needed for nausea or vomiting. 09/29/21   Truddie Hidden, MD  valACYclovir (VALTREX) 1000 MG tablet Take 1 tablet (1,000 mg total) by mouth daily. 08/15/20   Florian Buff, MD  zolpidem (AMBIEN) 10 MG tablet Take 1 tablet (10 mg total) by mouth at bedtime. 07/21/20   Cory Munch, PA-C      Allergies    Penicillins, Ondansetron hcl, and Fentanyl    Review of Systems   Review of Systems  Physical Exam Updated Vital Signs BP 124/75   Pulse 89   Temp 98.5 F (36.9 C)   Resp 19   Wt (!) 158.8 kg   SpO2 99%   BMI 62.00 kg/m  Physical Exam Constitutional:      General: She is not in acute distress.    Appearance: She is obese.  HENT:     Head: Normocephalic and atraumatic.     Comments: Tender nodules over the posterior tongue Eyes:     Conjunctiva/sclera: Conjunctivae normal.     Pupils: Pupils are equal, round, and reactive to light.  Cardiovascular:     Rate and Rhythm: Normal rate and regular rhythm.  Pulmonary:     Effort: Pulmonary effort is normal. No respiratory distress.  Abdominal:     General: There is no distension.     Tenderness: There is no abdominal tenderness.  Genitourinary:    Comments: GU exam performed with female chaperone in the room, the patient has a moderate amount of suspected physiological lochia and discharge in the vaginal vault, no mucoid discharge Skin:    General: Skin is warm and dry.  Neurological:     General: No focal deficit present.     Mental Status: She is alert and oriented to person, place, and time. Mental status is at baseline.  Psychiatric:        Mood and Affect: Mood normal.        Behavior: Behavior normal.     ED Results / Procedures / Treatments   Labs (all labs ordered are listed, but only abnormal results are displayed) Labs Reviewed  WET PREP,  GENITAL - Abnormal; Notable for the following components:      Result Value   WBC, Wet Prep HPF POC <10 (*)    All other components within normal limits  BASIC METABOLIC PANEL - Abnormal; Notable for the following components:   Glucose, Bld 108 (*)    All other components within normal limits  URINALYSIS, ROUTINE W REFLEX MICROSCOPIC - Abnormal; Notable for the following components:   APPearance HAZY (*)    Hgb urine dipstick MODERATE (*)    Leukocytes,Ua SMALL (*)    RBC / HPF >50 (*)    Bacteria, UA MANY (*)    All other components  within normal limits  CBC  GC/CHLAMYDIA PROBE AMP ([place]) NOT AT [facility]    EKG None  Radiology No results found.  Procedures Procedures    Medications Ordered in ED Medications  sodium chloride 0.9 % bolus 500 mL (0 mLs Intravenous Stopped 10/08/21 2141)  HYDROmorphone (DILAUDID) injection 0.5 mg (0.5 mg Intravenous Given 10/08/21 1841)  promethazine (PHENERGAN) 12.5 mg in sodium chloride 0.9 % 50 mL IVPB (0 mg Intravenous Stopped 10/08/21 2141)    ED Course/ Medical Decision Making/ A&P Clinical Course as of 10/08/21 2320  Thu Oct 08, 2021  1706 Catheter placed only 50-70 cc's urine returned [MT]  1809 I contacted Claiborne Billings Dixon's office at [facility] the health department -they confirmed that the patient did have swab sent, the results have not returned, and she did not have cervical motion tenderness on their exam.  They would agree with the plan of stopping her antibiotics until results return, given the patient is otherwise clinically well-appearing, and given that the antibiotics are likely causing the side effects. [MT]  1851 UA does not show significant signs of infection.  There are many squamous cells, likely contaminant sample.  Only small leukocytes, negative nitrites.  There is some hemoglobin which may be traumatic from the placement of the catheter. [MT]    Clinical Course User Index [MT] Chamar Broughton, Carola Rhine, MD                            Medical Decision Making Amount and/or Complexity of Data Reviewed Labs: ordered.  Risk Prescription drug management.   This patient presents to the ED with concern for urinary retention, thrush and difficulty swallowing. This involves an extensive number of treatment options, and is a complaint that carries with it a high risk of complications and morbidity.  The differential diagnosis includes UTI versus endometritis versus pelvic infection versus other  Within normal anatomical CT scan performed 5 days ago, I do not believe need to repeat CT scan of the abdomen at this time.  At this point as it is her second visit in 2 days for urinary difficulty, we will place a Foley catheter.  She will need a referral to urology to follow-up on this.  She ready has an OB doctor that she is seeing, and will need to follow-up for continued treatment of cervicitis.  She does not appear septic or toxic.  Co-morbidities that complicate the patient evaluation: History of endometriosis  Additional history obtained from patient's PCP by phone  External records from outside source obtained and reviewed including ED visits and work-up as noted above  I ordered and personally interpreted labs.  The pertinent results include: BMP and CBC unremarkable.  No leukocytosis.  No evidence of an AKI.  I ordered medication including IV fluid, IV Phenergan, IV Dilaudid for reported abdominal pain, nausea, concerns for dehydration and poor oral intake at home   After the interventions noted above, I reevaluated the patient and found that they have: stayed the same   Dispostion:  After consideration of the diagnostic results and the patients response to treatment, I feel that the patent would benefit from follow-up with PCP, urology         Final Clinical Impression(s) / ED Diagnoses Final diagnoses:  Urinary retention  Abdominal pain, unspecified abdominal location  Esophagitis    Rx / DC  Orders ED Discharge Orders          Ordered  magic mouthwash (lidocaine, diphenhydrAMINE, alum & mag hydroxide) suspension  2 times daily PRN        10/08/21 1924    promethazine (PHENERGAN) 25 MG suppository  Every 8 hours PRN        10/08/21 1924              Wyvonnia Dusky, MD 10/08/21 2321

## 2021-10-08 NOTE — Discharge Instructions (Addendum)
We have decided to stop the antibiotics (metronidazole and levofloxacin) you are currently taking, until we have definitive positive results from your swab cultures.  The antibiotics are likely giving you bad side effects, which may include thrush or pill esophagitis or irritation of your throat.  You should continue taking nystatin swish and spit, and the Diflucan medication, as well as consider using the Magic mouthwash numbing medication for the next 2 to 3 days to help with your painful swallowing.  I also prescribed you rectal Phenergan, as an alternative nausea medication that you do not need to swallow.  If your vaginal culture tests are positive (for gonorrhea or chlamydia), you will be contacted about resuming antibiotics.  This can take 3-5 days on average.  You can check your results daily on your patient portal online.  You can also check with the health department about their swab results.  Your wet prep testing did not show signs of bacterial vaginosis or trichomonas in the ER.  *  You should schedule follow-up appointment with a urologist to have your catheter examined in for your bladder spasms in 1 to 2 weeks.  The catheter should be exchanged or removed in no longer than 4 weeks, to prevent risk of infection.

## 2021-10-09 ENCOUNTER — Encounter: Payer: Self-pay | Admitting: Emergency Medicine

## 2021-10-09 ENCOUNTER — Telehealth: Payer: Self-pay | Admitting: Family Medicine

## 2021-10-09 ENCOUNTER — Ambulatory Visit
Admission: EM | Admit: 2021-10-09 | Discharge: 2021-10-09 | Disposition: A | Discharge status: Home / Self Care | Payer: Medicaid Other | Attending: Nurse Practitioner | Admitting: Nurse Practitioner

## 2021-10-09 DIAGNOSIS — R3989 Other symptoms and signs involving the genitourinary system: Secondary | ICD-10-CM

## 2021-10-09 LAB — GC/CHLAMYDIA PROBE AMP (~~LOC~~) NOT AT ARMC
Chlamydia: NEGATIVE
Comment: NEGATIVE
Comment: NORMAL
Neisseria Gonorrhea: NEGATIVE

## 2021-10-09 MED ORDER — OXYBUTYNIN CHLORIDE 5 MG PO TABS: 5.0000 mg | ORAL_TABLET | Freq: Three times a day (TID) | ORAL | 0 refills | Status: DC | PRN

## 2021-10-09 NOTE — ED Provider Notes (Signed)
[facility]    CSN: [id number] Arrival date & time: 10/09/21  1617      History   Chief Complaint No chief complaint on file.   HPI Janice Taylor is a 31 y.o. female.   Patient presents today with bladder pressure, lower abdominal pain after having Foley catheter inserted yesterday.  She denies any urine leaking around the catheter.  Over the past couple of weeks, she has been having significant lower abdominal pain, possible vaginitis and UTI.  She is on antibiotics and developed significant vaginitis, thrush and so antibiotics were discontinued until results were known.  Yesterday, she went to the emergency room and a Foley catheter was inserted for urinary retention.  She has planned follow-up with urology, and OB/GYN.  She is concerned that the catheter is not in the right place or that the balloon has come out.  Today, she denies fevers, body aches, chills, vomiting, although she is nauseous.    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    Leg swelling    Obesity 01/05/2013   Thrombocytopenia ([place]) 11/25/2011   Consistent with ITP    Patient Active Problem List   Diagnosis Date Noted   Anterior anal fissure 12/09/2020   Positive fecal occult blood test 12/03/2020   Boil of groin 03/31/2020   Hemorrhoids 03/31/2020   Hypertension 03/31/2020   Encounter for IUD insertion 01/22/2019   Anxiety 11/07/2018   History of ITP 11/25/2011    Past Surgical History:  Procedure Laterality Date   CESAREAN SECTION N/A 12/18/2018   Procedure: CESAREAN SECTION;  Surgeon: Woodroe Mode, MD;  Location: [facility] LD ORS;  Service: Obstetrics;  Laterality: N/A;   TONSILECTOMY, ADENOIDECTOMY, BILATERAL MYRINGOTOMY AND TUBES       OB History     Gravida  3   Para  2   Term  1   Preterm  1   AB  1   Living  2      SAB  1   IAB      Ectopic      Multiple  0   Live Births  2            Home Medications    Prior to Admission medications   Medication Sig Start Date End Date Taking? Authorizing Provider  oxybutynin (DITROPAN) 5 MG tablet Take 1 tablet (5 mg total) by mouth every 8 (eight) hours as needed for bladder spasms. 10/09/21  Yes Eulogio Bear, NP  ALPRAZolam Duanne Moron) 1 MG tablet Take 1 mg by mouth 3 (three) times daily as needed. 11/21/20   [provider]  amLODipine (NORVASC) 10 MG tablet Take 10 mg by mouth daily. 02/14/20   [provider]  cyclobenzaprine (FLEXERIL) 10 MG tablet TAKE 1 TABLET EVERY 8 HOURS AS NEEDED FOR MUSCLE SPASMS. 05/07/21   Florian Buff, MD  dicyclomine (BENTYL) 20 MG tablet Take 20 mg by mouth 4 (four) times daily. 11/20/20   [provider]  escitalopram (LEXAPRO) 10 MG tablet  09/17/20   [provider]  hydrochlorothiazide (HYDRODIURIL) 25 MG tablet Take 25 mg by mouth daily.    [provider]  hydrocortisone (ANUSOL-HC) 25 MG suppository Place 1 suppository (25 mg total) rectally 2 (two) times daily. 12/03/20   Estill Dooms, NP  ibuprofen (ADVIL) 800 MG tablet Take 800 mg by mouth every 8 (eight) hours as needed.    [provider]  Levonorgestrel (KYLEENA IU) by Intrauterine route.    [provider]  lidocaine (XYLOCAINE) 5 % ointment Apply topically 4 (four) times daily as needed. 12/03/20   Estill Dooms, NP  magic mouthwash (lidocaine, diphenhydrAMINE, alum & mag hydroxide) suspension Swish and swallow 5 mLs 2 (two) times daily as needed for up to 10 days for mouth pain. Do NOT exceed recommended daily dose 10/08/21 10/18/21  Wyvonnia Dusky, MD  metFORMIN (GLUCOPHAGE) 500 MG tablet Take 1 tablet (500 mg total) by mouth 2 (two) times daily with a meal. 04/17/21   Isla Pence, MD  oxyCODONE-acetaminophen (PERCOCET/ROXICET) 5-325 MG tablet Take 1 tablet by mouth every 6 (six) hours as needed for severe pain. 10/03/21   Davonna Belling, MD  potassium chloride SA (KLOR-CON M) 20 MEQ tablet Take 1 tablet (20 mEq total) by mouth 2 (two) times daily. 09/22/21   Fransico Meadow, PA-C  pramoxine-hydrocortisone (PROCTOCREAM-HC) 1-1 % rectal cream Place 1 application rectally 2 (two) times daily. 12/03/20   Estill Dooms, NP  promethazine (PHENERGAN) 25 MG suppository Place 1 suppository (25 mg total) rectally every 8 (eight) hours as needed for up to 12 doses for nausea or vomiting. 10/08/21   Wyvonnia Dusky, MD  promethazine (PHENERGAN) 25 MG tablet Take 1 tablet (25 mg total) by mouth every 6 (six) hours as needed for nausea or vomiting. 09/29/21   Truddie Hidden, MD  valACYclovir (VALTREX) 1000 MG tablet Take 1 tablet (1,000 mg total) by mouth daily. 08/15/20   Florian Buff, MD  zolpidem (AMBIEN) 10 MG tablet Take 1 tablet (10 mg total) by mouth at bedtime. 07/21/20   Cory Munch, PA-C    Family History Family History  Problem Relation Age of Onset   Diabetes Maternal Uncle    Kidney disease Maternal Uncle        renal failure/dialysis   Heart attack Maternal Grandmother     Social History Social History   Tobacco Use   Smoking status: Never   Smokeless tobacco: Never  Vaping Use   Vaping Use: Never used  Substance Use Topics   Alcohol use: No   Drug use: No     Allergies   Penicillins, Ondansetron hcl, and Fentanyl   Review of Systems Review of Systems Per HPI  Physical Exam Triage Vital Signs ED Triage Vitals  Enc Vitals Group     BP 10/09/21 1651 134/77     Pulse Rate 10/09/21 1651 (!) 113     Resp 10/09/21 1651 18     Temp 10/09/21 1651 (!) 97.1 F (36.2 C)     Temp Source 10/09/21 1651 Temporal     SpO2 10/09/21 1651 93 %     Weight --      Height --      Head Circumference --      Peak Flow --      Pain Score  10/09/21 1658 7     Pain Loc --      Pain Edu? --      Excl. in [place]? --    No data found.  Updated Vital Signs BP 134/77 (BP Location: Right Arm)   Pulse (!) 113  Temp (!) 97.1 F (36.2 C) (Temporal)   Resp 18   SpO2 93%   Visual Acuity Right Eye Distance:   Left Eye Distance:   Bilateral Distance:    Right Eye Near:   Left Eye Near:    Bilateral Near:     Physical Exam Vitals and nursing note reviewed. Exam conducted with a chaperone present Chilton Si).  Constitutional:      General: She is not in acute distress.    Appearance: Normal appearance. She is obese. She is not toxic-appearing.  Genitourinary:    Exam position: Lithotomy position.     Pubic Area: No rash.      Urethra: No prolapse or urethral swelling.     Comments: Foley catheter in place to urethra Lymphadenopathy:     Lower Body: No right inguinal adenopathy. No left inguinal adenopathy.  Skin:    General: Skin is warm and dry.     Capillary Refill: Capillary refill takes less than 2 seconds.     Coloration: Skin is not jaundiced or pale.     Findings: No erythema.  Neurological:     Mental Status: She is alert and oriented to person, place, and time.  Psychiatric:        Behavior: Behavior is cooperative.      UC Treatments / Results  Labs (all labs ordered are listed, but only abnormal results are displayed) Labs Reviewed - No data to display  EKG   Radiology No results found.  Procedures Procedures (including critical care time)  Medications Ordered in UC Medications - No data to display  Initial Impression / Assessment and Plan / UC Course  I have reviewed the triage vital signs and the nursing notes.  Pertinent labs & imaging results that were available during my care of the patient were reviewed by me and considered in my medical decision making (see chart for details).    I examined the area around the Foley catheter today; the Foley catheter appears to be in place and is  draining urine. The StatLock was placed high up on the thigh and was placing tension on the catheter tubing, therefore we moved the StatLock closer to the inner area of her thigh to prevent tension on the tubing.  Suspect the bladder pressure secondary to water spasms-treat with oxybutynin 5 mg every 8 hours as needed.  Discussed side effects of this with her at length and she desired to attempt to try the medication.  We discussed strict ER precautions.  The patient was given the opportunity to ask questions.  All questions answered to their satisfaction.  The patient is in agreement to this plan.   Final Clinical Impressions(s) / UC Diagnoses   Final diagnoses:  Sensation of pressure in bladder area     Discharge Instructions      - The Foley catheter is in place, I suspect the blood pressure in her bladder is coming from bladder spasms -You can use oxybutynin 5 mg every 8 hours as needed for bladder spasms -If you develop fever, nausea/vomiting and are unable to keep fluids down, fatigue, body aches/chills, please go to emergency room    ED Prescriptions     Medication Sig Dispense Auth. Provider   oxybutynin (DITROPAN) 5 MG tablet Take 1 tablet (5 mg total) by mouth every 8 (eight) hours as needed for bladder spasms. 15 tablet Eulogio Bear, NP      I have reviewed the PDMP during this encounter.  Eulogio Bear, NP 10/09/21 [phone]

## 2021-10-09 NOTE — Telephone Encounter (Signed)
Patient called and asked for an order sent to [place] for a catheter bag.  We will attempt to provide a verbal to [facility]

## 2021-10-09 NOTE — ED Triage Notes (Signed)
History of cervix irritation and was placed on antibiotics by GYN.  A few days later was unable to urinate.  Was placed on Levaquin by PCP.  States she developed thrush from antibiotics and was told to go to ED by PCP.  Was seen in ED yesterday and a cathter was placed.  States she was taken off the antibiotics.  State she is having a lot of pressure and unsure if cathter has come out of place.

## 2021-10-09 NOTE — Discharge Instructions (Signed)
-   The Foley catheter is in place, I suspect the blood pressure in her bladder is coming from bladder spasms -You can use oxybutynin 5 mg every 8 hours as needed for bladder spasms -If you develop fever, nausea/vomiting and are unable to keep fluids down, fatigue, body aches/chills, please go to emergency room

## 2021-10-12 ENCOUNTER — Emergency Department (HOSPITAL_COMMUNITY)
Admission: EM | Admit: 2021-10-12 | Discharge: 2021-10-12 | Disposition: A | Discharge status: Home / Self Care | Payer: Medicaid Other

## 2021-10-12 NOTE — ED Notes (Signed)
Pt decided not to be seen

## 2021-10-13 ENCOUNTER — Ambulatory Visit: Payer: Medicaid Other | Admitting: Urology

## 2021-10-15 ENCOUNTER — Emergency Department (HOSPITAL_COMMUNITY)
Admission: EM | Admit: 2021-10-15 | Discharge: 2021-10-15 | Disposition: A | Discharge status: Home / Self Care | Payer: Medicaid Other | Attending: Emergency Medicine | Admitting: Emergency Medicine

## 2021-10-15 ENCOUNTER — Encounter (HOSPITAL_COMMUNITY): Payer: Self-pay | Admitting: Emergency Medicine

## 2021-10-15 DIAGNOSIS — R339 Retention of urine, unspecified: Secondary | ICD-10-CM | POA: Insufficient documentation

## 2021-10-15 DIAGNOSIS — Z79899 Other long term (current) drug therapy: Secondary | ICD-10-CM | POA: Insufficient documentation

## 2021-10-15 DIAGNOSIS — I10 Essential (primary) hypertension: Secondary | ICD-10-CM | POA: Insufficient documentation

## 2021-10-15 LAB — CBC WITH DIFFERENTIAL/PLATELET
Abs Immature Granulocytes: 0.03 10*3/uL (ref 0.00–0.07)
Basophils Absolute: 0 10*3/uL (ref 0.0–0.1)
Basophils Relative: 0 %
Eosinophils Absolute: 0.2 10*3/uL (ref 0.0–0.5)
Eosinophils Relative: 2 %
HCT: 37.2 % (ref 36.0–46.0)
Hemoglobin: 11.6 g/dL — ABNORMAL LOW (ref 12.0–15.0)
Immature Granulocytes: 0 %
Lymphocytes Relative: 21 %
Lymphs Abs: 1.7 10*3/uL (ref 0.7–4.0)
MCH: 26.7 pg (ref 26.0–34.0)
MCHC: 31.2 g/dL (ref 30.0–36.0)
MCV: 85.5 fL (ref 80.0–100.0)
Monocytes Absolute: 0.4 10*3/uL (ref 0.1–1.0)
Monocytes Relative: 5 %
Neutro Abs: 5.8 10*3/uL (ref 1.7–7.7)
Neutrophils Relative %: 72 %
Platelets: 210 10*3/uL (ref 150–400)
RBC: 4.35 MIL/uL (ref 3.87–5.11)
RDW: 14.7 % (ref 11.5–15.5)
WBC: 8.1 10*3/uL (ref 4.0–10.5)
nRBC: 0 % (ref 0.0–0.2)

## 2021-10-15 LAB — PREGNANCY, URINE: Preg Test, Ur: NEGATIVE

## 2021-10-15 LAB — URINALYSIS, ROUTINE W REFLEX MICROSCOPIC
Bilirubin Urine: NEGATIVE
Glucose, UA: NEGATIVE mg/dL
Ketones, ur: NEGATIVE mg/dL
Nitrite: NEGATIVE
Protein, ur: NEGATIVE mg/dL
Specific Gravity, Urine: 1.014 (ref 1.005–1.030)
pH: 5 (ref 5.0–8.0)

## 2021-10-15 LAB — BASIC METABOLIC PANEL
Anion gap: 7 (ref 5–15)
BUN: 13 mg/dL (ref 6–20)
CO2: 29 mmol/L (ref 22–32)
Calcium: 8.8 mg/dL — ABNORMAL LOW (ref 8.9–10.3)
Chloride: 101 mmol/L (ref 98–111)
Creatinine, Ser: 0.95 mg/dL (ref 0.44–1.00)
GFR, Estimated: >60 mL/min (ref >60)
Glucose, Bld: 85 mg/dL (ref 70–99)
Potassium: 3.8 mmol/L (ref 3.5–5.1)
Sodium: 137 mmol/L (ref 135–145)

## 2021-10-15 MED ORDER — SODIUM CHLORIDE 0.9 % IV BOLUS
1000.0000 mL | Freq: Once | INTRAVENOUS | Status: AC
  Administered 2021-10-15: 1000 mL via INTRAVENOUS

## 2021-10-15 MED ORDER — ACETAMINOPHEN 500 MG PO TABS
1000.0000 mg | ORAL_TABLET | Freq: Once | ORAL | Status: AC
  Administered 2021-10-15: 1000 mg via ORAL
  Filled 2021-10-15: qty 2

## 2021-10-15 NOTE — ED Notes (Signed)
Patient verbalizes understanding of discharge instructions. Opportunity for questioning and answers were provided. Pt discharged from ED. 

## 2021-10-15 NOTE — ED Notes (Signed)
Pt eating [employer]; NAD; family at bedside

## 2021-10-15 NOTE — ED Provider Notes (Cosign Needed)
[facility] Provider Note   CSN: [id number] Arrival date & time: 10/15/21  1497     History  Chief Complaint  Patient presents with   Urinary Retention    [facility] is a 31 y.o. female with history of ADHD, depression, anxiety, idiopathic thrombocytopenic purpura, hypertension, status post cesarean section.  Presents to the emergency department with a chief complaint of urinary retention.  Patient reports that she removed her Foley catheter at home yesterday.  Patient states that she took it out because it was "irritating."  Patient was not advised by urologist or her PCP to remove the catheter.  Patient states that she has had urinary retention since then.  Produced a small amount of urine this morning when attempting to have a bowel movement.  Patient is also concerned that she might have a urinary tract or kidney infection.  She shows me multiple pictures of urine within her Foley catheter bag stating "it does not look infected."  Patient reports that she has been seeing some intermittent hematuria over the past few days.  Patient is also concerned that she might be dehydrated.  Patient states that she developed thrush after being started on antibiotics due to concern for PID.  Patient states that she has had decreased p.o. intake due to that.  When asked patient reports that she had chicken minis and sweet tea from [employer] earlier this morning without difficulties.  HPI     Home Medications Prior to Admission medications   Medication Sig Start Date End Date Taking? Authorizing Provider  ALPRAZolam Duanne Moron) 1 MG tablet Take 1 mg by mouth 3 (three) times daily as needed. 11/21/20   [provider]  amLODipine (NORVASC) 10 MG tablet Take 10 mg by mouth daily. 02/14/20   [provider]  cyclobenzaprine (FLEXERIL) 10 MG tablet TAKE 1 TABLET EVERY 8 HOURS AS NEEDED FOR MUSCLE SPASMS. 05/07/21   Florian Buff, MD  dicyclomine  (BENTYL) 20 MG tablet Take 20 mg by mouth 4 (four) times daily. 11/20/20   [provider]  escitalopram (LEXAPRO) 10 MG tablet  09/17/20   [provider]  hydrochlorothiazide (HYDRODIURIL) 25 MG tablet Take 25 mg by mouth daily.    [provider]  hydrocortisone (ANUSOL-HC) 25 MG suppository Place 1 suppository (25 mg total) rectally 2 (two) times daily. 12/03/20   Estill Dooms, NP  ibuprofen (ADVIL) 800 MG tablet Take 800 mg by mouth every 8 (eight) hours as needed.    [provider]  Levonorgestrel (KYLEENA IU) by Intrauterine route.    [provider]  lidocaine (XYLOCAINE) 5 % ointment Apply topically 4 (four) times daily as needed. 12/03/20   Estill Dooms, NP  magic mouthwash (lidocaine, diphenhydrAMINE, alum & mag hydroxide) suspension Swish and swallow 5 mLs 2 (two) times daily as needed for up to 10 days for mouth pain. Do NOT exceed recommended daily dose 10/08/21 10/18/21  Wyvonnia Dusky, MD  metFORMIN (GLUCOPHAGE) 500 MG tablet Take 1 tablet (500 mg total) by mouth 2 (two) times daily with a meal. 04/17/21   Isla Pence, MD  oxybutynin (DITROPAN) 5 MG tablet Take 1 tablet (5 mg total) by mouth every 8 (eight) hours as needed for bladder spasms. 10/09/21   Eulogio Bear, NP  oxyCODONE-acetaminophen (PERCOCET/ROXICET) 5-325 MG tablet Take 1 tablet by mouth every 6 (six) hours as needed for severe pain. 10/03/21   Davonna Belling, MD  potassium chloride SA (KLOR-CON  M) 20 MEQ tablet Take 1 tablet (20 mEq total) by mouth 2 (two) times daily. 09/22/21   Fransico Meadow, PA-C  pramoxine-hydrocortisone (PROCTOCREAM-HC) 1-1 % rectal cream Place 1 application rectally 2 (two) times daily. 12/03/20   Estill Dooms, NP  promethazine (PHENERGAN) 25 MG suppository Place 1 suppository (25 mg total) rectally every 8 (eight) hours as needed for up to 12 doses for nausea or vomiting. 10/08/21   Wyvonnia Dusky, MD  promethazine  (PHENERGAN) 25 MG tablet Take 1 tablet (25 mg total) by mouth every 6 (six) hours as needed for nausea or vomiting. 09/29/21   Truddie Hidden, MD  valACYclovir (VALTREX) 1000 MG tablet Take 1 tablet (1,000 mg total) by mouth daily. 08/15/20   Florian Buff, MD  zolpidem (AMBIEN) 10 MG tablet Take 1 tablet (10 mg total) by mouth at bedtime. 07/21/20   Cory Munch, PA-C      Allergies    Penicillins, Ondansetron hcl, and Fentanyl    Review of Systems   Review of Systems  Constitutional:  Negative for chills and fever.  HENT:  Positive for trouble swallowing.   Respiratory:  Negative for shortness of breath.   Cardiovascular:  Negative for chest pain.  Gastrointestinal:  Positive for constipation (baseline for patient). Negative for abdominal pain, nausea and vomiting.  Genitourinary:  Positive for difficulty urinating, flank pain and hematuria. Negative for dysuria, vaginal bleeding and vaginal discharge.  Musculoskeletal:  Negative for back pain and neck pain.  Skin:  Negative for color change and rash.  Neurological:  Negative for dizziness, syncope, light-headedness and headaches.  Psychiatric/Behavioral:  Negative for confusion.     Physical Exam Updated Vital Signs BP (!) 167/73 (BP Location: Right Arm)   Pulse (!) 108   Temp 98 F (36.7 C) (Oral)   Resp (!) 24   SpO2 99%  Physical Exam Vitals and nursing note reviewed.  Constitutional:      General: She is not in acute distress.    Appearance: She is morbidly obese. She is not ill-appearing, toxic-appearing or diaphoretic.  HENT:     Head: Normocephalic.  Eyes:     General: No scleral icterus.       Right eye: No discharge.        Left eye: No discharge.  Cardiovascular:     Rate and Rhythm: Normal rate.  Pulmonary:     Effort: Pulmonary effort is normal.  Abdominal:     General: Abdomen is protuberant. Bowel sounds are normal. There is no distension. There are no signs of injury.     Palpations: Abdomen is  soft. There is no mass or pulsatile mass.     Tenderness: There is no abdominal tenderness. There is no right CVA tenderness, left CVA tenderness, guarding or rebound.  Skin:    General: Skin is warm and dry.  Neurological:     General: No focal deficit present.     Mental Status: She is alert.  Psychiatric:        Behavior: Behavior is cooperative.     ED Results / Procedures / Treatments   Labs (all labs ordered are listed, but only abnormal results are displayed) Labs Reviewed  URINALYSIS, ROUTINE W REFLEX MICROSCOPIC - Abnormal; Notable for the following components:      Result Value   Hgb urine dipstick MODERATE (*)    Leukocytes,Ua SMALL (*)    Bacteria, UA RARE (*)    All other components within normal  limits  BASIC METABOLIC PANEL - Abnormal; Notable for the following components:   Calcium 8.8 (*)    All other components within normal limits  CBC WITH DIFFERENTIAL/PLATELET - Abnormal; Notable for the following components:   Hemoglobin 11.6 (*)    All other components within normal limits  URINE CULTURE  PREGNANCY, URINE    EKG None  Radiology No results found.  Procedures Procedures    Medications Ordered in ED Medications  sodium chloride 0.9 % bolus 1,000 mL (1,000 mLs Intravenous New Bag/Given 10/15/21 1100)  acetaminophen (TYLENOL) tablet 1,000 mg (1,000 mg Oral Given 10/15/21 1055)    ED Course/ Medical Decision Making/ A&P                           Medical Decision Making Amount and/or Complexity of Data Reviewed Labs: ordered.  Risk OTC drugs.   Alert 31 year old female no acute stress, nontoxic-appearing.  Presents to the emergency department with a chief complaint of urinary retention.  Information obtained from patient and patient's mother at bedside.  I reviewed patient's past medical records including previous divider notes, labs, and imaging.  Patient has medical history as outlined in HPI which complicates her care.  Per chart review  patient was treated for possible cervicitis versus PID on 6/10 with Levaquin and Flagyl.  Patient presents to the emergency department on 6/14 and 6/15 for decreased urination.  Patient had Foley catheter placed on 6/15 due to her urinary difficulty and was given information to follow-up with urology.  Patient has been seen in the emergency department on 6/17, 6/18, and 6/20 at [facility] for issues related to her Foley catheter.  Patient removed her own Foley catheter on 6/20 and had the catheter replaced in the emergency department.  Patient reported that she has urology follow-up scheduled for 6/21.  Additionally patient has reported difficulty swallowing multiple past ED visits however has been documented to be eating and drinking without any difficulty on those visits.  Due to reports of urinary retention will obtain bladder scan.  We will reinsert Foley catheter due to reports of urinary retention and over the last few days.  I emphasized the importance of leaving the Foley catheter in place until she can be evaluated by urology.  Patient states that she does have a urology appointment on 6/28.  We will also obtain urinalysis, BMP, CBC, and urine pregnancy test to evaluate for possible infection or dehydration.  We will give patient 1 L fluid bolus at this time.  Bladder scan found 85 mL in bladder.  I personally viewed interpret patient's lab results.  Pertinent findings include: -BMP unremarkable -CBC with hemoglobin at 11.6 -Urine pregnancy test negative -UA shows bacteria rare, WBC 21-50, RBC 21-50, leukocyte small, nitrite negative, specific gravity within normal limits.  Suspicion for UTI based on patient's urinalysis results.  Urine culture is pending.  We will leave Foley catheter in place due to patient's reported urinary retention.  Importance of leaving catheter in place until she can be followed up by urologist was stressed to patient and patient's family members at bedside.    Based on patient's chief complaint, I considered admission might be necessary, however after reassuring ED workup feel patient is reasonable for discharge.  Discussed results, findings, treatment and follow up. Patient advised of return precautions. Patient verbalized understanding and agreed with plan.  Portions of this note were generated with [employer]. Dictation errors may occur despite  best attempts at proofreading.         Final Clinical Impression(s) / ED Diagnoses Final diagnoses:  Urinary retention    Rx / DC Orders ED Discharge Orders     None         Loni Beckwith, PA-C 10/15/21 1123

## 2021-10-15 NOTE — Discharge Instructions (Signed)
You came to the emergency department today to be evaluated for your urinary retention.  A new Foley catheter was placed and is to remain in place until removed by a urologist.  DO NOT [place].  Please take Ibuprofen (Advil, motrin) and Tylenol (acetaminophen) to relieve your pain.    You may take up to 600 MG (3 pills) of normal strength ibuprofen every 8 hours as needed.   You make take tylenol, up to 1,000 mg (two extra strength pills) every 8 hours as needed.   It is safe to take ibuprofen and tylenol at the same time as they work differently.   Do not take more than 3,000 mg tylenol in a 24 hour period (not more than one dose every 8 hours.  Please check all medication labels as many medications such as pain and cold medications may contain tylenol.  Do not drink alcohol while taking these medications.  Do not take other NSAID'S while taking ibuprofen (such as aleve or naproxen).  Please take ibuprofen with food to decrease stomach upset.  Get help right away if: You see blood in the catheter. Your pee is pink or red. Your bladder feels full. Your pee is not draining into the bag. Your catheter gets pulled out.

## 2021-10-15 NOTE — ED Notes (Signed)
Bladder scan 85 mls

## 2021-10-15 NOTE — ED Triage Notes (Signed)
Patient here with complaint of urinary retention that started eight days ago. Patient states she went to the ED in [place], [place] and was told to go to [facility] so that she could see a urologist. Patient is alert, oriented, and in no apparent distress at this time.

## 2021-10-16 LAB — URINE CULTURE

## 2021-10-18 ENCOUNTER — Emergency Department (HOSPITAL_COMMUNITY)
Admission: EM | Admit: 2021-10-18 | Discharge: 2021-10-18 | Disposition: A | Discharge status: Home / Self Care | Payer: Medicaid Other | Attending: Emergency Medicine | Admitting: Emergency Medicine

## 2021-10-18 ENCOUNTER — Other Ambulatory Visit: Payer: Self-pay

## 2021-10-18 ENCOUNTER — Encounter (HOSPITAL_COMMUNITY): Payer: Self-pay | Admitting: Emergency Medicine

## 2021-10-18 DIAGNOSIS — N390 Urinary tract infection, site not specified: Secondary | ICD-10-CM | POA: Insufficient documentation

## 2021-10-18 DIAGNOSIS — R300 Dysuria: Secondary | ICD-10-CM

## 2021-10-18 DIAGNOSIS — R339 Retention of urine, unspecified: Secondary | ICD-10-CM | POA: Diagnosis present

## 2021-10-18 LAB — URINALYSIS, ROUTINE W REFLEX MICROSCOPIC
Bilirubin Urine: NEGATIVE
Glucose, UA: NEGATIVE mg/dL
Ketones, ur: NEGATIVE mg/dL
Nitrite: POSITIVE — AB
Protein, ur: NEGATIVE mg/dL
Specific Gravity, Urine: 1.02 (ref 1.005–1.030)
WBC, UA: >50 WBC/hpf — ABNORMAL HIGH (ref 0–5)
pH: 5 (ref 5.0–8.0)

## 2021-10-18 MED ORDER — KETOROLAC TROMETHAMINE 60 MG/2ML IM SOLN
30.0000 mg | Freq: Once | INTRAMUSCULAR | Status: AC
  Administered 2021-10-18: 30 mg via INTRAMUSCULAR
  Filled 2021-10-18: qty 2

## 2021-10-18 MED ORDER — SULFAMETHOXAZOLE-TRIMETHOPRIM 800-160 MG PO TABS: 1.0000 | ORAL_TABLET | Freq: Two times a day (BID) | ORAL | 0 refills | Status: AC

## 2021-10-18 NOTE — ED Notes (Signed)
Patient provided with pill crusher to take antibiotics.  Reports she is unable to take pills due to thrush

## 2021-10-19 ENCOUNTER — Emergency Department (HOSPITAL_COMMUNITY)
Admission: EM | Admit: 2021-10-19 | Discharge: 2021-10-19 | Disposition: A | Discharge status: Home / Self Care | Payer: Medicaid Other | Attending: Emergency Medicine | Admitting: Emergency Medicine

## 2021-10-19 ENCOUNTER — Emergency Department (HOSPITAL_COMMUNITY): Payer: Medicaid Other

## 2021-10-19 ENCOUNTER — Encounter (HOSPITAL_COMMUNITY): Payer: Self-pay | Admitting: Emergency Medicine

## 2021-10-19 DIAGNOSIS — Y92410 Unspecified street and highway as the place of occurrence of the external cause: Secondary | ICD-10-CM | POA: Insufficient documentation

## 2021-10-19 DIAGNOSIS — M545 Low back pain, unspecified: Secondary | ICD-10-CM | POA: Insufficient documentation

## 2021-10-19 DIAGNOSIS — S299XXA Unspecified injury of thorax, initial encounter: Secondary | ICD-10-CM | POA: Diagnosis present

## 2021-10-19 DIAGNOSIS — S20219A Contusion of unspecified front wall of thorax, initial encounter: Secondary | ICD-10-CM | POA: Diagnosis not present

## 2021-10-19 DIAGNOSIS — M542 Cervicalgia: Secondary | ICD-10-CM | POA: Diagnosis not present

## 2021-10-19 DIAGNOSIS — S0990XA Unspecified injury of head, initial encounter: Secondary | ICD-10-CM | POA: Insufficient documentation

## 2021-10-21 ENCOUNTER — Ambulatory Visit: Payer: Medicaid Other | Admitting: Urology

## 2021-10-21 ENCOUNTER — Encounter: Payer: Self-pay | Admitting: Urology

## 2021-10-21 ENCOUNTER — Ambulatory Visit (INDEPENDENT_AMBULATORY_CARE_PROVIDER_SITE_OTHER): Payer: Medicaid Other | Admitting: Urology

## 2021-10-21 VITALS — BP 123/81 | HR 106

## 2021-10-21 DIAGNOSIS — R339 Retention of urine, unspecified: Secondary | ICD-10-CM

## 2021-10-21 LAB — URINALYSIS, ROUTINE W REFLEX MICROSCOPIC
Bilirubin, UA: NEGATIVE
Glucose, UA: NEGATIVE
Ketones, UA: NEGATIVE
Leukocytes,UA: NEGATIVE
Nitrite, UA: NEGATIVE
Protein,UA: NEGATIVE
Specific Gravity, UA: 1.015 (ref 1.005–1.030)
Urobilinogen, Ur: 0.2 mg/dL (ref 0.2–1.0)
pH, UA: 6.5 (ref 5.0–7.5)

## 2021-10-21 LAB — BLADDER SCAN AMB NON-IMAGING: Scan Result: 88

## 2021-10-21 LAB — URINE CULTURE: Culture: >=100000 — AB

## 2021-10-21 MED ORDER — TAMSULOSIN HCL 0.4 MG PO CAPS: 0.4000 mg | ORAL_CAPSULE | Freq: Every day | ORAL | 3 refills | Status: DC

## 2021-10-21 NOTE — Progress Notes (Signed)
post void residual= 88

## 2021-10-21 NOTE — Patient Instructions (Signed)
Acute Urinary Retention, Female  Acute urinary retention is when a person cannot pee (urinate) at all, or can only pee a little. This can come on all of a sudden. If it is not treated, it can lead to kidney problems or other serious problems. What are the causes? A problem with the tube that drains the bladder (urethra). Problems with the nerves in the bladder. The organs in the area between your hip bones (pelvis) slipping out of place (prolapse). Tumors. The birth of a baby through the vagina. An infection. Having trouble pooping (constipation). Certain medicines. What increases the risk? Women over age 50 are more at risk. Other conditions also can increase risk. These include: Diseases, such as multiple sclerosis. Injury to the spinal cord. Diabetes. A condition that affects the way the brain works, such as dementia. Holding back urine due to trauma or because you do not want to use the bathroom. History of not being able to pee or peeing too little. Having had surgery in the area between your hip bones. What are the signs or symptoms? Trouble peeing. Pain in the lower belly. How is this treated? Treatment for this condition may include: Medicines. Placing a thin, germ-free tube (catheter) into the bladder to drain pee out of the body. Therapy to treat mental health conditions. Treatment for conditions that may cause this. If needed, you may be treated in the hospital for kidney problems or to manage other problems. Follow these instructions at home: Medicines Take over-the-counter and prescription medicines only as told by your doctor. Ask your doctor what medicines you should stay away from. If you were given an antibiotic medicine, take it as told by your doctor. Do not stop taking it even if you start to feel better. General instructions Do not smoke or use any products that contain nicotine or tobacco. If you need help quitting, ask your doctor. Drink enough fluid to  keep your pee pale yellow. If you were sent home with a tube that drains the bladder, take care of it as told by your doctor. Watch for changes in your symptoms. Tell your doctor about them. If told, keep track of changes in your blood pressure at home. Tell your doctor about them. Keep all follow-up visits. Contact a doctor if: You have spasms in your bladder that you cannot stop. You leak pee when you have spasms. Get help right away if: You have chills or a fever. You have blood in your pee. You have a tube that drains pee from the bladder and these things happen: The tube stops draining pee. The tube falls out. Summary Acute urinary retention is when you cannot pee at all or you pee too little. If this is not treated, it can cause kidney problems or other serious problems. If you were sent home with a tube (catheter) that drains pee from the bladder, take care of it as told by your doctor. Watch for changes in your symptoms. Tell your doctor about them. This information is not intended to replace advice given to you by your health care provider. Make sure you discuss any questions you have with your health care provider. Document Revised: 01/02/2020 Document Reviewed: 01/02/2020 Elsevier Patient Education  2023 [employer]  

## 2021-10-21 NOTE — Progress Notes (Signed)
10/21/2021 3:10 PM   Janice Taylor December 08, 1990 [id number]  Referring provider: Redmond School, MD [address] [place],  [place] [address]  Incomplete emptying   HPI: Janice Taylor is a 31yo here for evaluation of incomplete emptying. Starting 2 weeks ago she developed difficulty urinating. She developed a vaginal infection after IUD placement. She was seen in the ER 8 times in the past 2 weeks due to issue urinating. She has had multiple foley catheters place din the past 2 weeks. She has urinary hesitancy, urinary frequency and urgency. She was given flomax 0.'4mg'$  daily which significantly improved her LUTS.    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) 11/25/2011   Consistent with ITP    Surgical History: Past Surgical History:  Procedure Laterality Date   CESAREAN SECTION N/A 12/18/2018   Procedure: CESAREAN SECTION;  Surgeon: Woodroe Mode, MD;  Location: [facility] LD ORS;  Service: Obstetrics;  Laterality: N/A;   TONSILECTOMY, ADENOIDECTOMY, BILATERAL MYRINGOTOMY AND TUBES      Home Medications:  Allergies as of 10/21/2021       Reactions   Penicillins Anaphylaxis   Ondansetron Hcl Hives   Not effective` Not effective` Not effective` Not effective` Not effective` Not effective`   Fentanyl         Medication List        Accurate as of October 21, 2021  3:10 PM. If you have any questions, ask your nurse or doctor.          ALPRAZolam 1 MG tablet Commonly known as: XANAX Take 1 mg by mouth 3 (three) times daily as needed.   amLODipine 10 MG tablet Commonly known as: NORVASC Take 10 mg by mouth daily.   cyclobenzaprine 10 MG  tablet Commonly known as: FLEXERIL TAKE 1 TABLET EVERY 8 HOURS AS NEEDED FOR MUSCLE SPASMS.   dicyclomine 20 MG tablet Commonly known as: BENTYL Take 20 mg by mouth 4 (four) times daily.   escitalopram 10 MG tablet Commonly known as: LEXAPRO   hydrochlorothiazide 25 MG tablet Commonly known as: HYDRODIURIL Take 25 mg by mouth daily.   hydrocortisone 25 MG suppository Commonly known as: ANUSOL-HC Place 1 suppository (25 mg total) rectally 2 (two) times daily.   ibuprofen 800 MG tablet Commonly known as: ADVIL Take 800 mg by mouth every 8 (eight) hours as needed.   KYLEENA IU by Intrauterine route.   lidocaine 5 % ointment Commonly known as: XYLOCAINE Apply topically 4 (four) times daily as needed.   metFORMIN 500 MG tablet Commonly known as: GLUCOPHAGE Take 1 tablet (500 mg total) by mouth 2 (two) times daily with a meal.   oxybutynin 5 MG tablet Commonly known as: DITROPAN Take 1 tablet (5 mg total) by mouth every 8 (eight) hours as needed for bladder spasms.   oxyCODONE-acetaminophen 5-325 MG tablet Commonly known as: PERCOCET/ROXICET Take 1 tablet by mouth every 6 (six) hours as needed for severe pain.   potassium chloride SA 20 MEQ tablet Commonly known as: KLOR-CON M Take 1 tablet (20 mEq total) by mouth 2 (two) times daily.  pramoxine-hydrocortisone 1-1 % rectal cream Commonly known as: PROCTOCREAM-HC Place 1 application rectally 2 (two) times daily.   promethazine 25 MG tablet Commonly known as: PHENERGAN Take 1 tablet (25 mg total) by mouth every 6 (six) hours as needed for nausea or vomiting.   promethazine 25 MG suppository Commonly known as: PHENERGAN Place 1 suppository (25 mg total) rectally every 8 (eight) hours as needed for up to 12 doses for nausea or vomiting.   sulfamethoxazole-trimethoprim 800-160 MG tablet Commonly known as: BACTRIM DS Take 1 tablet by mouth 2 (two) times daily for 7 days.   valACYclovir 1000 MG tablet Commonly known  as: Valtrex Take 1 tablet (1,000 mg total) by mouth daily.   zolpidem 10 MG tablet Commonly known as: AMBIEN Take 1 tablet (10 mg total) by mouth at bedtime.        Allergies:  Allergies  Allergen Reactions   Penicillins Anaphylaxis   Ondansetron Hcl Hives    Not effective` Not effective` Not effective` Not effective` Not effective` Not effective`    Fentanyl     Family History: Family History  Problem Relation Age of Onset   Diabetes Maternal Uncle    Kidney disease Maternal Uncle        renal failure/dialysis   Heart attack Maternal Grandmother     Social History:  reports that she has never smoked. She has never used smokeless tobacco. She reports that she does not drink alcohol and does not use drugs.  ROS: All other review of systems were reviewed and are negative except what is noted above in HPI  Physical Exam: BP 123/81   Pulse (!) 106   Constitutional:  Alert and oriented, No acute distress. HEENT: [place] AT, moist mucus membranes.  Trachea midline, no masses. Cardiovascular: No clubbing, cyanosis, or edema. Respiratory: Normal respiratory effort, no increased work of breathing. GI: Abdomen is soft, nontender, nondistended, no abdominal masses GU: No CVA tenderness.  Lymph: No cervical or inguinal lymphadenopathy. Skin: No rashes, bruises or suspicious lesions. Neurologic: Grossly intact, no focal deficits, moving all 4 extremities. Psychiatric: Normal mood and affect.  Laboratory Data: Lab Results  Component Value Date   WBC 8.1 10/15/2021   HGB 11.6 (L) 10/15/2021   HCT 37.2 10/15/2021   MCV 85.5 10/15/2021   PLT 210 10/15/2021    Lab Results  Component Value Date   CREATININE 0.95 10/15/2021    No results found for: "PSA"  No results found for: "TESTOSTERONE"  Lab Results  Component Value Date   HGBA1C 7.0 (H) 04/17/2021    Urinalysis    Component Value Date/Time   COLORURINE YELLOW 10/18/2021 2139   APPEARANCEUR CLOUDY (A)  10/18/2021 2139   APPEARANCEUR Clear 07/05/2018 1549   LABSPEC 1.020 10/18/2021 2139   PHURINE 5.0 10/18/2021 2139   GLUCOSEU NEGATIVE 10/18/2021 2139   HGBUR MODERATE (A) 10/18/2021 2139   BILIRUBINUR NEGATIVE 10/18/2021 2139   BILIRUBINUR Negative 07/05/2018 1549   KETONESUR NEGATIVE 10/18/2021 2139   PROTEINUR NEGATIVE 10/18/2021 2139   UROBILINOGEN 0.2 01/25/2015 1103   NITRITE POSITIVE (A) 10/18/2021 2139   LEUKOCYTESUR LARGE (A) 10/18/2021 2139    Lab Results  Component Value Date   LABMICR Comment 07/05/2018   BACTERIA MANY (A) 10/18/2021    Pertinent Imaging: CT 10/03/2021: Images reviewed and discussed with the patient  Results for orders placed during the hospital encounter of 01/25/15  DG Abd 1 View  Narrative CLINICAL DATA:  Abdominal pain.  History of kidney stones  EXAM:  ABDOMEN - 1 VIEW  COMPARISON:  CT abdomen 01/22/2015  FINDINGS: Small stone proximal left ureter is faintly radiopaque on CT and not visualized on the x-ray. This was located at the L3 level on CT  Small bilateral renal calculi identified on CT not identified on the current study.  Normal bowel gas pattern.  No focal bony abnormality.  IMPRESSION: Small renal calculi identified on recent CT are not visualized on the current x-ray. No acute abnormality.   Electronically Signed By: Franchot Gallo M.D. On: 01/25/2015 11:45  Results for orders placed during the hospital encounter of 01/14/12  US Venous Img Lower Bilateral  Narrative *RADIOLOGY REPORT*  Clinical data:  Bilateral lower extremity pain and edema. Thrombocytopenia.  BILATERAL LOWER EXTREMITY VENOUS DOPPLER ULTRASOUND  Technique:  Gray-scale sonography with compression, as well as color and duplex Doppler ultrasound, were performed to evaluate the deep venous system from the level of the common femoral vein through the popliteal and proximal calf veins. Evaluation also included physiologic response to  augmentation.  Comparison: None.  Findings: All of the visualized deep veins in both lower extremities demonstrate normal Doppler signal, normal compressibility, normal phasicity, and normal physiologic response to augmentation.  No gray scale filling defects were identified. The visualized greater saphenous veins are similarly patent in both lower extremities.  IMPRESSION: No evidence of DVT involving either the right or left lower extremity.   Original Report Authenticated By: Deniece Portela, M.D.  No results found for this or any previous visit.  No results found for this or any previous visit.  No results found for this or any previous visit.  No results found for this or any previous visit.  No results found for this or any previous visit.  Results for orders placed during the hospital encounter of 04/17/21  CT Renal Stone Study  Narrative CLINICAL DATA:  Flank pain, kidney stone suspected  EXAM: CT ABDOMEN AND PELVIS WITHOUT CONTRAST  TECHNIQUE: Multidetector CT imaging of the abdomen and pelvis was performed following the standard protocol without IV contrast.  COMPARISON:  01/25/2021  FINDINGS: Lower chest: No acute abnormality.  Hepatobiliary: Hepatic steatosis. Gallbladder is unremarkable. No biliary dilatation.  Pancreas: Unremarkable.  Spleen: Unremarkable.  Adrenals/Urinary Tract: Adrenals are unremarkable. Small bilateral nonobstructing renal calculi again identified measuring up to 2 mm. No definite ureteral calculi. Partially distended bladder is unremarkable.  Stomach/Bowel: Stomach is within normal limits. Bowel is normal in caliber. Normal appendix.  Vascular/Lymphatic: No significant vascular abnormality on this noncontrast study. No enlarged nodes.  Reproductive: Uterus and bilateral adnexa are unremarkable.  Other: No free fluid.  No acute abnormality of the abdominal wall.  Musculoskeletal: No acute osseous  abnormality.  IMPRESSION: No acute abnormality.  Small bilateral nonobstructing renal calculi.  Hepatic steatosis.   Electronically Signed By: Macy Mis M.D. On: 04/17/2021 10:28   Assessment & Plan:    1. Urinary retention -Continue flomax 0.'4mg'$  daily -RTC 3 months   No follow-ups on file.  Nicolette Bang, MD  [facility]

## 2021-10-22 ENCOUNTER — Telehealth: Payer: Self-pay

## 2021-10-22 NOTE — Telephone Encounter (Signed)
Post ED Visit - Positive Culture Follow-up  Culture report reviewed by antimicrobial stewardship pharmacist: [place] Team '[x]'$  Luisa Hart, Pharm.D. '[]'$  Heide Guile, Pharm.D., BCPS AQ-ID '[]'$  Parks Neptune, Pharm.D., BCPS '[]'$  Alycia Rossetti, Pharm.D., BCPS '[]'$  Froid, Pharm.D., BCPS, AAHIVP '[]'$  Legrand Como, Pharm.D., BCPS, AAHIVP '[]'$  Salome Arnt, PharmD, BCPS '[]'$  Johnnette Gourd, PharmD, BCPS '[]'$  Hughes Better, PharmD, BCPS '[]'$  Leeroy Cha, PharmD '[]'$  Laqueta Linden, PharmD, BCPS '[]'$  Albertina Parr, PharmD  [place] Team '[]'$  Leodis Sias, PharmD '[]'$  Lindell Spar, PharmD '[]'$  Royetta Asal, PharmD '[]'$  Graylin Shiver, Rph '[]'$  Rema Fendt) Glennon Mac, PharmD '[]'$  Arlyn Dunning, PharmD '[]'$  Netta Cedars, PharmD '[]'$  Dia Sitter, PharmD '[]'$  Leone Haven, PharmD '[]'$  Gretta Arab, PharmD '[]'$  Theodis Shove, PharmD '[]'$  Peggyann Juba, PharmD '[]'$  Reuel Boom, PharmD   Positive urine culture Treated with Sulfamethoxazole-Trimethoprim, organism sensitive to the same and no further patient follow-up is required at this time.  Glennon Hamilton 10/22/2021, 6:21 PM

## 2021-11-17 ENCOUNTER — Ambulatory Visit
Admission: EM | Admit: 2021-11-17 | Discharge: 2021-11-17 | Disposition: A | Discharge status: Home / Self Care | Payer: Medicaid Other | Attending: Emergency Medicine | Admitting: Emergency Medicine

## 2021-11-17 DIAGNOSIS — N3090 Cystitis, unspecified without hematuria: Secondary | ICD-10-CM

## 2021-11-17 LAB — POCT URINALYSIS DIP (MANUAL ENTRY)
Bilirubin, UA: NEGATIVE
Glucose, UA: NEGATIVE mg/dL
Ketones, POC UA: NEGATIVE mg/dL
Nitrite, UA: NEGATIVE
Spec Grav, UA: 1.02 (ref 1.010–1.025)
Urobilinogen, UA: 0.2 E.U./dL
pH, UA: 6 (ref 5.0–8.0)

## 2021-11-17 LAB — POCT URINE PREGNANCY: Preg Test, Ur: NEGATIVE

## 2021-11-17 MED ORDER — KETOROLAC TROMETHAMINE 30 MG/ML IJ SOLN
30.0000 mg | Freq: Once | INTRAMUSCULAR | Status: AC
  Administered 2021-11-17: 30 mg via INTRAMUSCULAR

## 2021-11-17 MED ORDER — CIPROFLOXACIN HCL 500 MG PO TABS: 500.0000 mg | ORAL_TABLET | Freq: Two times a day (BID) | ORAL | 0 refills | Status: AC

## 2021-11-17 MED ORDER — CEFTRIAXONE SODIUM 1 G IJ SOLR
1.0000 g | Freq: Once | INTRAMUSCULAR | Status: AC
  Administered 2021-11-17: 1 g via INTRAMUSCULAR

## 2021-11-17 MED ORDER — PHENAZOPYRIDINE HCL 100 MG PO TABS
100.0000 mg | ORAL_TABLET | Freq: Three times a day (TID) | ORAL | 0 refills | Status: AC | PRN
Start: 2021-11-17 — End: 2021-11-19

## 2021-11-17 NOTE — ED Provider Notes (Signed)
[facility]    CSN: [id number] Arrival date & time: 11/17/21  1443    HISTORY   Chief Complaint  Patient presents with   Cystitis   HPI Janice Taylor is a pleasant, 31 y.o. female who presents to urgent care today. Patient complains of bladder spasms and pressure in her lower pelvis that began yesterday.  Patient states she feels like she needs to urinate but was unable to do so.  Patient states she reports In-N-Out caths intermittently due to presence of urinary stones that block her urethra.  Patient states she has an appointment with her urologist tomorrow however could not wait due to discomfort becoming unbearable.  The history is provided by the patient.   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    Leg swelling    Obesity 01/05/2013   Thrombocytopenia ([place]) 11/25/2011   Consistent with ITP   Patient Active Problem List   Diagnosis Date Noted   Anterior anal fissure 12/09/2020   Positive fecal occult blood test 12/03/2020   Boil of groin 03/31/2020   Hemorrhoids 03/31/2020   Hypertension 03/31/2020   Encounter for IUD insertion 01/22/2019   Anxiety 11/07/2018   History of ITP 11/25/2011   Past Surgical History:  Procedure Laterality Date   CESAREAN SECTION N/A 12/18/2018   Procedure: CESAREAN SECTION;  Surgeon: Woodroe Mode, MD;  Location: [facility] LD [facility]S;  Service: Obstetrics;  Laterality: N/A;   TONSILECTOMY, ADENOIDECTOMY, BILATERAL MYRINGOTOMY AND TUBES     OB History     Gravida  3   Para  2   Term  1   Preterm  1   AB  1   Living  2      SAB  1   IAB      Ectopic      Multiple  0   Live Births  2          Home Medications    Prior to Admission medications    Medication Sig Start Date End Date Taking? Authorizing Provider  ciprofloxacin (CIPRO) 500 MG tablet Take 1 tablet (500 mg total) by mouth every 12 (twelve) hours for 5 days. 11/17/21 11/22/21 Yes Lynden Oxford Scales, PA-C  phenazopyridine (PYRIDIUM) 100 MG tablet Take 1 tablet (100 mg total) by mouth 3 (three) times daily as needed for up to 2 days for pain. 11/17/21 11/19/21 Yes Lynden Oxford Scales, PA-C  ALPRAZolam Duanne Moron) 1 MG tablet Take 1 mg by mouth 3 (three) times daily as needed. 11/21/20   [provider]  alprazolam Duanne Moron) 2 MG tablet Take 2 mg by mouth 3 (three) times daily as needed. 10/07/21   [provider]  amitriptyline (ELAVIL) 25 MG tablet Take by mouth. 09/30/21   [provider]  amLODipine (NORVASC) 10 MG tablet Take 10 mg by mouth daily. 02/14/20   [provider]  cyclobenzaprine (FLEXERIL) 10 MG tablet TAKE 1 TABLET EVERY 8 HOURS AS NEEDED FOR MUSCLE SPASMS. 05/07/21   Florian Buff, MD  dicyclomine (BENTYL) 20 MG tablet Take 20 mg by mouth 4 (four) times daily. 11/20/20   [provider]  escitalopram (LEXAPRO) 10 MG tablet  09/17/20   [provider]  hydrochlorothiazide (HYDRODIURIL) 25 MG tablet Take 25 mg by mouth daily.    [provider]  hydrocortisone (ANUSOL-HC) 25 MG suppository Place 1 suppository (25 mg total) rectally 2 (two) times daily. 12/03/20   Estill Dooms, NP  ibuprofen (ADVIL) 800 MG tablet Take 800 mg by mouth every 8 (eight) hours as needed.    [provider]  Levonorgestrel (KYLEENA IU) by Intrauterine route. Patient not taking: Reported on 10/21/2021    [provider]  lidocaine (XYLOCAINE) 5 % ointment Apply topically 4 (four) times daily as needed. 12/03/20   Estill Dooms, NP  metFORMIN (GLUCOPHAGE) 500 MG tablet Take 1 tablet (500 mg total) by mouth 2 (two) times daily with a meal. 04/17/21   Isla Pence, MD  oxybutynin (DITROPAN) 5 MG tablet Take 1  tablet (5 mg total) by mouth every 8 (eight) hours as needed for bladder spasms. 10/09/21   Eulogio Bear, NP  oxyCODONE-acetaminophen (PERCOCET/ROXICET) 5-325 MG tablet Take 1 tablet by mouth every 6 (six) hours as needed for severe pain. Patient not taking: Reported on 10/21/2021 10/03/21   Davonna Belling, MD  potassium chloride SA (KLOR-CON M) 20 MEQ tablet Take 1 tablet (20 mEq total) by mouth 2 (two) times daily. 09/22/21   Fransico Meadow, PA-C  pramoxine-hydrocortisone (PROCTOCREAM-HC) 1-1 % rectal cream Place 1 application rectally 2 (two) times daily. 12/03/20   Estill Dooms, NP  promethazine (PHENERGAN) 25 MG suppository Place 1 suppository (25 mg total) rectally every 8 (eight) hours as needed for up to 12 doses for nausea or vomiting. Patient not taking: Reported on 10/21/2021 10/08/21   Wyvonnia Dusky, MD  promethazine (PHENERGAN) 25 MG tablet Take 1 tablet (25 mg total) by mouth every 6 (six) hours as needed for nausea or vomiting. 09/29/21   Truddie Hidden, MD  tamsulosin (FLOMAX) 0.4 MG CAPS capsule Take 1 capsule (0.4 mg total) by mouth daily after supper. 10/21/21   McKenzie, Candee Furbish, MD  valACYclovir (VALTREX) 1000 MG tablet Take 1 tablet (1,000 mg total) by mouth daily. 08/15/20   Florian Buff, MD  zolpidem (AMBIEN) 10 MG tablet Take 1 tablet (10 mg total) by mouth at bedtime. 07/21/20   Cory Munch, PA-C    Family History Family History  Problem Relation Age of Onset   Diabetes Maternal Uncle    Kidney disease Maternal Uncle        renal failure/dialysis   Heart attack Maternal Grandmother    Social History Social History   Tobacco Use   Smoking status: Never   Smokeless tobacco: Never  Vaping Use   Vaping Use: Never used  Substance Use Topics   Alcohol use: No   Drug use: No   Allergies   Ondansetron hcl, Fentanyl, and Penicillins  Review of Systems Review of Systems Pertinent findings revealed after performing a 14 point review of  systems has been noted in the history of present illness.  Physical Exam Triage Vital Signs ED Triage Vitals  Enc Vitals Group     BP 02/20/21 0827 (!) 147/82     Pulse Rate 02/20/21 0827 72     Resp 02/20/21 0827 18     Temp 02/20/21 0827 98.3 F (36.8 C)     Temp Source 02/20/21 0827 Oral     SpO2 02/20/21 0827 98 %     Weight --      Height --  Head Circumference --      Peak Flow --      Pain Score 02/20/21 0826 5     Pain Loc --      Pain Edu? --      Excl. in [place]? --   No data found.  Updated Vital Signs BP 135/88 (BP Location: Right Arm)   Pulse 92   Temp 98.3 F (36.8 C) (Oral)   Resp 18   SpO2 96%   Physical Exam Vitals and nursing note reviewed.  Constitutional:      General: She is awake. She is not in acute distress.    Appearance: Normal appearance. She is well-developed and well-groomed. She is morbidly obese. She is not ill-appearing.  HENT:     Head: Normocephalic and atraumatic.  Eyes:     General: Lids are normal.        Right eye: No discharge.        Left eye: No discharge.     Extraocular Movements: Extraocular movements intact.     Conjunctiva/sclera: Conjunctivae normal.     Right eye: Right conjunctiva is not injected.     Left eye: Left conjunctiva is not injected.  Neck:     Trachea: Trachea and phonation normal.  Cardiovascular:     Rate and Rhythm: Normal rate and regular rhythm.     Pulses: Normal pulses.     Heart sounds: Normal heart sounds. No murmur heard.    No friction rub. No gallop.  Pulmonary:     Effort: Pulmonary effort is normal. No accessory muscle usage, prolonged expiration or respiratory distress.     Breath sounds: Normal breath sounds. No stridor, decreased air movement or transmitted upper airway sounds. No decreased breath sounds, wheezing, rhonchi or rales.  Chest:     Chest wall: No tenderness.  Abdominal:     General: Abdomen is flat. Bowel sounds are normal. There is no distension.     Palpations:  Abdomen is soft.     Tenderness: There is abdominal tenderness in the suprapubic area. There is no right CVA tenderness or left CVA tenderness.     Hernia: No hernia is present.  Musculoskeletal:        General: Normal range of motion.     Cervical back: Normal range of motion and neck supple. Normal range of motion.  Lymphadenopathy:     Cervical: No cervical adenopathy.  Skin:    General: Skin is warm and dry.     Findings: No erythema or rash.  Neurological:     General: No focal deficit present.     Mental Status: She is alert and oriented to person, place, and time.  Psychiatric:        Mood and Affect: Mood normal.        Behavior: Behavior normal. Behavior is cooperative.     Visual Acuity Right Eye Distance:   Left Eye Distance:   Bilateral Distance:    Right Eye Near:   Left Eye Near:    Bilateral Near:     [facility] Couse / Diagnostics / Procedures:     Radiology No results found.  Procedures Procedures (including critical care time) EKG  Pending results:  Labs Reviewed  POCT URINALYSIS DIP (MANUAL ENTRY) - Abnormal; Notable for the following components:      Result Value   Clarity, UA cloudy (*)    Blood, UA moderate (*)    Protein Ur, POC trace (*)    Leukocytes, UA  Large (3+) (*)    All other components within normal limits  URINE CULTURE  POCT URINE PREGNANCY    Medications Ordered in UC: Medications  cefTRIAXone (ROCEPHIN) injection 1 g (has no administration in time range)  ketorolac (TORADOL) 30 MG/ML injection 30 mg (has no administration in time range)    UC Diagnoses / Final Clinical Impressions(s)   I have reviewed the triage vital signs and the nursing notes.  Pertinent labs & imaging results that were available during my care of the patient were reviewed by me and considered in my medical decision making (see chart for details).    Final diagnoses:  Cystitis   Blood and leukocytes present on urinalysis today, will send for culture.   Patient treated empirically for with ceftriaxone and ciprofloxacin.  Patient also requesting prescription for Pyridium for pain relief which I provided for her.  Patient verbalizes plan to keep follow-up appointment tomorrow with urologist.  ED Prescriptions     Medication Sig Dispense Auth. Provider   ciprofloxacin (CIPRO) 500 MG tablet Take 1 tablet (500 mg total) by mouth every 12 (twelve) hours for 5 days. 10 tablet Lynden Oxford Scales, PA-C   phenazopyridine (PYRIDIUM) 100 MG tablet Take 1 tablet (100 mg total) by mouth 3 (three) times daily as needed for up to 2 days for pain. 6 tablet Lynden Oxford Scales, PA-C      PDMP not reviewed this encounter.  Disposition Upon Discharge:  Condition: stable for discharge home  Patient presented with concern for an acute illness with associated systemic symptoms and significant discomfort requiring urgent management. In my opinion, this is a condition that a prudent lay person (someone who possesses an average knowledge of health and medicine) may potentially expect to result in complications if not addressed urgently such as respiratory distress, impairment of bodily function or dysfunction of bodily organs.   As such, the patient has been evaluated and assessed, work-up was performed and treatment was provided in alignment with urgent care protocols and evidence based medicine.  Patient/parent/caregiver has been advised that the patient may require follow up for further testing and/or treatment if the symptoms continue in spite of treatment, as clinically indicated and appropriate.  Routine symptom specific, illness specific and/or disease specific instructions were discussed with the patient and/or caregiver at length.  Prevention strategies for avoiding STD exposure were also discussed.  The patient will follow up with their current PCP if and as advised. If the patient does not currently have a PCP we will assist them in obtaining one.    The patient may need specialty follow up if the symptoms continue, in spite of conservative treatment and management, for further workup, evaluation, consultation and treatment as clinically indicated and appropriate.  Patient/parent/caregiver verbalized understanding and agreement of plan as discussed.  All questions were addressed during visit.  Please see discharge instructions below for further details of plan.  Discharge Instructions:   Discharge Instructions      The urinalysis that we performed in the clinic today was abnormal.  Urine culture will be performed per our protocol.  The result of the urine culture will be available in the next 3 to 5 days and will be posted to your MyChart account.  If there is an abnormal finding, you will be contacted by phone and advised of further treatment recommendations, if any.   You were advised to begin antibiotics today because your urinalysis is abnormal and you are having active symptoms of an acute lower  urinary tract infection also known as cystitis.  It is very important that you take all doses exactly as prescribed.  Incomplete antibiotic therapy can cause worsening urinary tract infection that can become aggressive, escape from urinary tract into your bloodstream causing sepsis which will require hospitalization.   You received an injection of a strong antibiotic called ceftriaxone during your today to help expedite resolution of your urinary tract infection.   Please pick up and begin taking your prescription for Ciprofloxacin 500 mg as soon as possible.  Please take all doses exactly as prescribed.  You can take this medication with or without food.  This medication is safe to take with your other medications.   I have also provided you with a prescription for Pyridium.  If you have not had complete resolution of your symptoms after completing treatment as prescribed, please return to urgent care for repeat evaluation or follow-up with  your primary care provider.   Thank you for visiting urgent care today.  I appreciate the opportunity to participate in your care.     This office note has been dictated using Museum/gallery curator.  Unfortunately, this method of dictation can sometimes lead to typographical or grammatical errors.  I apologize for your inconvenience in advance if this occurs.  Please do not hesitate to reach out to me if clarification is needed.       Lynden Oxford Scales, PA-C 11/19/21 1536

## 2021-11-17 NOTE — ED Triage Notes (Signed)
Pt c/o bladder pain (spasms) and pressure, she states she has the feeling of having to urinate but does not actually go.   Started: yesterday   Home interventions: azo (last taken: 6hrs ago)

## 2021-11-17 NOTE — Discharge Instructions (Addendum)
The urinalysis that we performed in the clinic today was abnormal.  Urine culture will be performed per our protocol.  The result of the urine culture will be available in the next 3 to 5 days and will be posted to your MyChart account.  If there is an abnormal finding, you will be contacted by phone and advised of further treatment recommendations, if any.   You were advised to begin antibiotics today because your urinalysis is abnormal and you are having active symptoms of an acute lower urinary tract infection also known as cystitis.  It is very important that you take all doses exactly as prescribed.  Incomplete antibiotic therapy can cause worsening urinary tract infection that can become aggressive, escape from urinary tract into your bloodstream causing sepsis which will require hospitalization.   You received an injection of a strong antibiotic called ceftriaxone during your today to help expedite resolution of your urinary tract infection.   Please pick up and begin taking your prescription for Ciprofloxacin 500 mg as soon as possible.  Please take all doses exactly as prescribed.  You can take this medication with or without food.  This medication is safe to take with your other medications.   I have also provided you with a prescription for Pyridium.  If you have not had complete resolution of your symptoms after completing treatment as prescribed, please return to urgent care for repeat evaluation or follow-up with your primary care provider.   Thank you for visiting urgent care today.  I appreciate the opportunity to participate in your care.

## 2021-11-18 ENCOUNTER — Ambulatory Visit: Payer: Medicaid Other | Admitting: Urology

## 2021-11-19 ENCOUNTER — Telehealth (HOSPITAL_COMMUNITY): Payer: Self-pay | Admitting: Emergency Medicine

## 2021-11-19 LAB — URINE CULTURE: Culture: 50000 — AB

## 2021-11-19 MED ORDER — NITROFURANTOIN MONOHYD MACRO 100 MG PO CAPS: 100.0000 mg | ORAL_CAPSULE | Freq: Two times a day (BID) | ORAL | 0 refills | Status: DC

## 2022-01-19 ENCOUNTER — Ambulatory Visit
Admission: EM | Admit: 2022-01-19 | Discharge: 2022-01-19 | Disposition: A | Discharge status: Home / Self Care | Payer: Medicaid Other | Attending: Urgent Care | Admitting: Urgent Care

## 2022-01-19 DIAGNOSIS — N760 Acute vaginitis: Secondary | ICD-10-CM | POA: Insufficient documentation

## 2022-01-19 DIAGNOSIS — R0982 Postnasal drip: Secondary | ICD-10-CM | POA: Diagnosis present

## 2022-01-19 DIAGNOSIS — N898 Other specified noninflammatory disorders of vagina: Secondary | ICD-10-CM | POA: Diagnosis present

## 2022-01-19 DIAGNOSIS — R070 Pain in throat: Secondary | ICD-10-CM | POA: Diagnosis present

## 2022-01-19 DIAGNOSIS — B9689 Other specified bacterial agents as the cause of diseases classified elsewhere: Secondary | ICD-10-CM | POA: Insufficient documentation

## 2022-01-19 DIAGNOSIS — R052 Subacute cough: Secondary | ICD-10-CM | POA: Diagnosis present

## 2022-01-19 DIAGNOSIS — U071 COVID-19: Secondary | ICD-10-CM | POA: Insufficient documentation

## 2022-01-19 LAB — POCT RAPID STREP A (OFFICE): Rapid Strep A Screen: NEGATIVE

## 2022-01-19 MED ORDER — PROMETHAZINE-DM 6.25-15 MG/5ML PO SYRP
2.5000 mL | ORAL_SOLUTION | Freq: Three times a day (TID) | ORAL | 0 refills | Status: DC | PRN
Start: 2022-01-19 — End: 2023-01-05

## 2022-01-19 MED ORDER — PSEUDOEPHEDRINE HCL 60 MG PO TABS
60.0000 mg | ORAL_TABLET | Freq: Three times a day (TID) | ORAL | 0 refills | Status: DC | PRN
Start: 2022-01-19 — End: 2023-01-05

## 2022-01-19 MED ORDER — METRONIDAZOLE 500 MG PO TABS: 500.0000 mg | ORAL_TABLET | Freq: Two times a day (BID) | ORAL | 0 refills | Status: DC

## 2022-01-19 MED ORDER — CETIRIZINE HCL 10 MG PO TABS: 10.0000 mg | ORAL_TABLET | Freq: Every day | ORAL | 0 refills | Status: AC

## 2022-01-19 MED ORDER — NAPROXEN 500 MG PO TABS: 500.0000 mg | ORAL_TABLET | Freq: Two times a day (BID) | ORAL | 0 refills | Status: DC

## 2022-01-19 MED ORDER — FLUCONAZOLE 150 MG PO TABS
150.0000 mg | ORAL_TABLET | ORAL | 0 refills | Status: DC
Start: 2022-01-19 — End: 2023-01-05

## 2022-01-19 NOTE — ED Provider Notes (Signed)
[facility]  Note:  This document was prepared using Systems analyst and may include unintentional dictation errors.  MRN: [id number] DOB: 04/09/1991  Subjective:   Janice Taylor is a 31 y.o. female presenting for 4-day history of acute onset persistent throat pain, painful swallowing.  Patient is currently undergoing COVID infection.  She is on day 7.  No overt chest pain, shortness of breath.  Her son did test positive for strep throat and is currently undergoing treatment.  Patient has also had vaginal discharge with malodor for the past 3 days.  She believes she has bacterial vaginosis again.  She was seen 3 days ago through a different clinic and picked up medications only for yeast vaginitis.  She was prescribed metronidazole but states she did not pick it up.  She is not opposed to doing a vaginal swab for check of BV, yeast but also other STIs.  No urinary symptoms.   No current facility-administered medications for this encounter.  Current Outpatient Medications:    ALPRAZolam (XANAX) 1 MG tablet, Take 1 mg by mouth 3 (three) times daily as needed., Disp: , Rfl:    alprazolam (XANAX) 2 MG tablet, Take 2 mg by mouth 3 (three) times daily as needed., Disp: , Rfl:    amitriptyline (ELAVIL) 25 MG tablet, Take by mouth., Disp: , Rfl:    amLODipine (NORVASC) 10 MG tablet, Take 10 mg by mouth daily., Disp: , Rfl:    cyclobenzaprine (FLEXERIL) 10 MG tablet, TAKE 1 TABLET EVERY 8 HOURS AS NEEDED FOR MUSCLE SPASMS., Disp: 30 tablet, Rfl: 0   dicyclomine (BENTYL) 20 MG tablet, Take 20 mg by mouth 4 (four) times daily., Disp: , Rfl:    escitalopram (LEXAPRO) 10 MG tablet, , Disp: , Rfl:    hydrochlorothiazide (HYDRODIURIL) 25 MG tablet, Take 25 mg by mouth daily., Disp: , Rfl:    hydrocortisone (ANUSOL-HC) 25 MG suppository, Place 1 suppository (25 mg total) rectally 2 (two) times daily., Disp: 12 suppository, Rfl: 0   ibuprofen (ADVIL) 800 MG tablet,  Take 800 mg by mouth every 8 (eight) hours as needed., Disp: , Rfl:    Levonorgestrel (KYLEENA IU), by Intrauterine route. (Patient not taking: Reported on 10/21/2021), Disp: , Rfl:    lidocaine (XYLOCAINE) 5 % ointment, Apply topically 4 (four) times daily as needed., Disp: 35.44 g, Rfl: 1   metFORMIN (GLUCOPHAGE) 500 MG tablet, Take 1 tablet (500 mg total) by mouth 2 (two) times daily with a meal., Disp: 60 tablet, Rfl: 0   nitrofurantoin, macrocrystal-monohydrate, (MACROBID) 100 MG capsule, Take 1 capsule (100 mg total) by mouth 2 (two) times daily., Disp: 10 capsule, Rfl: 0   oxybutynin (DITROPAN) 5 MG tablet, Take 1 tablet (5 mg total) by mouth every 8 (eight) hours as needed for bladder spasms., Disp: 15 tablet, Rfl: 0   oxyCODONE-acetaminophen (PERCOCET/ROXICET) 5-325 MG tablet, Take 1 tablet by mouth every 6 (six) hours as needed for severe pain. (Patient not taking: Reported on 10/21/2021), Disp: 6 tablet, Rfl: 0   potassium chloride SA (KLOR-CON M) 20 MEQ tablet, Take 1 tablet (20 mEq total) by mouth 2 (two) times daily., Disp: 30 tablet, Rfl: 0   pramoxine-hydrocortisone (PROCTOCREAM-HC) 1-1 % rectal cream, Place 1 application rectally 2 (two) times daily., Disp: 30 g, Rfl: 1   promethazine (PHENERGAN) 25 MG suppository, Place 1 suppository (25 mg total) rectally every 8 (eight) hours as needed for up to 12 doses for nausea or vomiting. (  Patient not taking: Reported on 10/21/2021), Disp: 12 each, Rfl: 0   promethazine (PHENERGAN) 25 MG tablet, Take 1 tablet (25 mg total) by mouth every 6 (six) hours as needed for nausea or vomiting., Disp: 10 tablet, Rfl: 0   tamsulosin (FLOMAX) 0.4 MG CAPS capsule, Take 1 capsule (0.4 mg total) by mouth daily after supper., Disp: 30 capsule, Rfl: 3   valACYclovir (VALTREX) 1000 MG tablet, Take 1 tablet (1,000 mg total) by mouth daily., Disp: 10 tablet, Rfl: 11   zolpidem (AMBIEN) 10 MG tablet, Take 1 tablet (10 mg total) by mouth at bedtime., Disp: 75 tablet,  Rfl: 0   Allergies  Allergen Reactions   Ondansetron Hcl Hives    Not effective` Not effective` Not effective` Not effective` Not effective` Not effective`    Fentanyl    Penicillins Rash    Pt tolerates ceftriaxone well    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    Leg swelling    Obesity 01/05/2013   Thrombocytopenia ([place]) 11/25/2011   Consistent with ITP     Past Surgical History:  Procedure Laterality Date   CESAREAN SECTION N/A 12/18/2018   Procedure: CESAREAN SECTION;  Surgeon: Woodroe Mode, MD;  Location: [facility];  Service: Obstetrics;  Laterality: N/A;   TONSILECTOMY, ADENOIDECTOMY, BILATERAL MYRINGOTOMY AND TUBES      Family History  Problem Relation Age of Onset   Diabetes Maternal Uncle    Kidney disease Maternal Uncle        renal failure/dialysis   Heart attack Maternal Grandmother     Social History   Tobacco Use   Smoking status: Never   Smokeless tobacco: Never  Vaping Use   Vaping Use: Never used  Substance Use Topics   Alcohol use: No   Drug use: No    ROS   Objective:   Vitals: BP 138/84   Pulse 91   Temp 98.1 F (36.7 C)   Resp 17   SpO2 95%   Physical Exam Constitutional:      General: She is not in acute distress.    Appearance: Normal appearance. She is well-developed. She is not ill-appearing, toxic-appearing or diaphoretic.  HENT:     Head: Normocephalic and atraumatic.     Nose: Nose normal.     Mouth/Throat:     Mouth: Mucous membranes are moist.     Pharynx: No pharyngeal swelling, oropharyngeal exudate, posterior oropharyngeal erythema or uvula swelling.     Tonsils: No tonsillar exudate or tonsillar abscesses. 0 on the right. 0 on the left.      Comments: Thick streaks of post-nasal drainage.  Eyes:     General: No scleral icterus.       Right eye: No discharge.        Left eye: No discharge.     Extraocular Movements: Extraocular movements intact.  Cardiovascular:     Rate and Rhythm: Normal rate.  Pulmonary:     Effort: Pulmonary effort is normal.  Skin:    General: Skin is warm and dry.  Neurological:     General: No focal deficit present.     Mental Status: She is alert and  oriented to person, place, and time.  Psychiatric:        Mood and Affect: Mood normal.        Behavior: Behavior normal.     Results for orders placed or performed during the hospital encounter of 01/19/22 (from the past 24 hour(s))  POCT rapid strep A     Status: None   Collection Time: 01/19/22  1:02 PM  Result Value Ref Range   Rapid Strep A Screen Negative Negative     Assessment and Plan :   PDMP not reviewed this encounter.  1. Bacterial vaginosis   2. Throat pain   3. Vaginal discharge   4. COVID-19 virus infection   5. Post-nasal drainage     Recommended covering for empiric treatment for BV with Flagyl. Use another round of fluconazole to address yeast vaginitis.  Labs pending we will treat as appropriate based off of that.  Throat culture was negative.  Patient requested a medication different from ibuprofen.  I advised that we would use naproxen.  At discharge, patient requested narcotic pain medicine and narcotic cough syrup.  I declined to do this.  However, I did prescribe her promethazine cough syrup. Counseled patient on potential for adverse effects with medications prescribed/recommended today, ER and return-to-clinic precautions discussed, patient verbalized understanding.    Jaynee Eagles, PA-C 01/19/22 1400

## 2022-01-19 NOTE — ED Triage Notes (Addendum)
Pt presents with sore throat since Saturday. Pts son has strep throat. Pt is getting over covid, she is on day 7.   Pt is experiencing vaginal discharge and odor x 3 days. Concerned for having bv again.

## 2022-01-20 ENCOUNTER — Ambulatory Visit: Payer: Medicaid Other | Admitting: Urology

## 2022-01-20 LAB — CERVICOVAGINAL ANCILLARY ONLY
Bacterial Vaginitis (gardnerella): POSITIVE — AB
Candida Glabrata: NEGATIVE
Candida Vaginitis: NEGATIVE
Chlamydia: NEGATIVE
Comment: NEGATIVE
Comment: NEGATIVE
Comment: NEGATIVE
Comment: NEGATIVE
Comment: NEGATIVE
Comment: NORMAL
Neisseria Gonorrhea: NEGATIVE
Trichomonas: NEGATIVE

## 2022-01-22 LAB — CULTURE, GROUP A STREP (THRC)

## 2022-01-28 ENCOUNTER — Ambulatory Visit (INDEPENDENT_AMBULATORY_CARE_PROVIDER_SITE_OTHER): Payer: Medicaid Other

## 2022-01-28 ENCOUNTER — Encounter: Payer: Self-pay | Admitting: Emergency Medicine

## 2022-01-28 ENCOUNTER — Ambulatory Visit
Admission: EM | Admit: 2022-01-28 | Discharge: 2022-01-28 | Disposition: A | Discharge status: Home / Self Care | Payer: Medicaid Other | Attending: Nurse Practitioner | Admitting: Nurse Practitioner

## 2022-01-28 DIAGNOSIS — E162 Hypoglycemia, unspecified: Secondary | ICD-10-CM | POA: Insufficient documentation

## 2022-01-28 DIAGNOSIS — U071 COVID-19: Secondary | ICD-10-CM | POA: Diagnosis present

## 2022-01-28 DIAGNOSIS — R6889 Other general symptoms and signs: Secondary | ICD-10-CM | POA: Diagnosis not present

## 2022-01-28 DIAGNOSIS — R0600 Dyspnea, unspecified: Secondary | ICD-10-CM | POA: Diagnosis present

## 2022-01-28 DIAGNOSIS — R0602 Shortness of breath: Secondary | ICD-10-CM

## 2022-01-28 DIAGNOSIS — R197 Diarrhea, unspecified: Secondary | ICD-10-CM | POA: Insufficient documentation

## 2022-01-28 LAB — POCT FASTING CBG KUC MANUAL ENTRY
POCT Glucose (KUC): 63 mg/dL — AB (ref 70–99)
POCT Glucose (KUC): 176 mg/dL — AB (ref 70–99)

## 2022-01-28 LAB — RESP PANEL BY RT-PCR (FLU A&B, COVID) ARPGX2
Influenza A by PCR: NEGATIVE
Influenza B by PCR: NEGATIVE
SARS Coronavirus 2 by RT PCR: POSITIVE — AB

## 2022-01-28 MED ORDER — ALBUTEROL SULFATE HFA 108 (90 BASE) MCG/ACT IN AERS: 2.0000 | INHALATION_SPRAY | Freq: Four times a day (QID) | RESPIRATORY_TRACT | 0 refills | Status: DC | PRN

## 2022-01-28 NOTE — ED Provider Notes (Addendum)
[facility]    CSN: [id number] Arrival date & time: 01/28/22  1533      History   Chief Complaint No chief complaint on file.   HPI Janice Taylor is a 31 y.o. female.   The history is provided by the patient.   Patient presents for complaints of headache, fatigue, chills, nasal drainage, body aches, and diarrhea that started today.  Patient reports that she was diagnosed with COVID approximately 7 days ago.  Patient states that she completed Paxlovid, started feeling better, and went back to work today.  She states that while she was at work, her symptoms started.  Patient states that she also has shortness of breath when at rest.  Patient does have a history of type 2 diabetes.  States that she was diagnosed earlier this year, and she is concerned about her blood sugars.  She states that she does not know the difference between a low blood sugar and a high blood sugar.  She states that on the way here, she also had an episode of diarrhea.  Patient states that she was given a second round of Paxlovid, but was told to stop by one of her doctors.  Patient denies chest pain, wheezing, abdominal pain, nausea, vomiting. 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    Leg swelling    Obesity 01/05/2013   Thrombocytopenia ([place]) 11/25/2011   Consistent with ITP    Patient Active Problem List   Diagnosis Date Noted   Anterior anal fissure 12/09/2020   Positive fecal occult blood test 12/03/2020   Boil of groin 03/31/2020   Hemorrhoids 03/31/2020   Hypertension 03/31/2020   Encounter for IUD insertion 01/22/2019   Anxiety 11/07/2018   History of ITP 11/25/2011    Past Surgical History:  Procedure Laterality Date    CESAREAN SECTION N/A 12/18/2018   Procedure: CESAREAN SECTION;  Surgeon: Woodroe Mode, MD;  Location: [facility];  Service: Obstetrics;  Laterality: N/A;   TONSILECTOMY, ADENOIDECTOMY, BILATERAL MYRINGOTOMY AND TUBES      OB History     Gravida  3   Para  2   Term  1   Preterm  1   AB  1   Living  2      SAB  1   IAB      Ectopic      Multiple  0   Live Births  2            Home Medications    Prior to Admission medications   Medication Sig Start Date End Date Taking? Authorizing Provider  albuterol (VENTOLIN HFA) 108 (90 Base) MCG/ACT inhaler Inhale 2 puffs into the lungs every 6 (six) hours as needed for wheezing or shortness of breath. 01/28/22  Yes Tippi Mccrae-Warren, Alda Lea, NP  ALPRAZolam Duanne Moron) 1 MG tablet Take 1 mg by mouth 3 (three) times daily as needed. 11/21/20   [provider]  alprazolam Duanne Moron) 2 MG tablet Take 2 mg by mouth 3 (three) times daily as needed. 10/07/21   [provider]  amitriptyline (ELAVIL) 25 MG tablet Take by mouth. 09/30/21   [provider]  amLODipine (  NORVASC) 10 MG tablet Take 10 mg by mouth daily. 02/14/20   [provider]  cetirizine (ZYRTEC ALLERGY) 10 MG tablet Take 1 tablet (10 mg total) by mouth daily. 01/19/22   Jaynee Eagles, PA-C  cyclobenzaprine (FLEXERIL) 10 MG tablet TAKE 1 TABLET EVERY 8 HOURS AS NEEDED FOR MUSCLE SPASMS. 05/07/21   Florian Buff, MD  dicyclomine (BENTYL) 20 MG tablet Take 20 mg by mouth 4 (four) times daily. 11/20/20   [provider]  escitalopram (LEXAPRO) 10 MG tablet  09/17/20   [provider]  fluconazole (DIFLUCAN) 150 MG tablet Take 1 tablet (150 mg total) by mouth once a week. 01/19/22   Jaynee Eagles, PA-C  hydrochlorothiazide (HYDRODIURIL) 25 MG tablet Take 25 mg by mouth daily.    [provider]  hydrocortisone (ANUSOL-HC) 25 MG suppository Place 1 suppository (25 mg total) rectally 2 (two) times daily. 12/03/20   Estill Dooms, NP  ibuprofen (ADVIL) 800 MG tablet Take 800 mg by mouth every 8 (eight) hours as needed.    [provider]  Levonorgestrel (KYLEENA IU) by Intrauterine route. Patient not taking: Reported on 10/21/2021    [provider]  lidocaine (XYLOCAINE) 5 % ointment Apply topically 4 (four) times daily as needed. 12/03/20   Estill Dooms, NP  metFORMIN (GLUCOPHAGE) 500 MG tablet Take 1 tablet (500 mg total) by mouth 2 (two) times daily with a meal. 04/17/21   Isla Pence, MD  metroNIDAZOLE (FLAGYL) 500 MG tablet Take 1 tablet (500 mg total) by mouth 2 (two) times daily with a meal. DO NOT CONSUME ALCOHOL WHILE TAKING THIS MEDICATION. 01/19/22   Jaynee Eagles, PA-C  naproxen (NAPROSYN) 500 MG tablet Take 1 tablet (500 mg total) by mouth 2 (two) times daily with a meal. 01/19/22   Jaynee Eagles, PA-C  nitrofurantoin, macrocrystal-monohydrate, (MACROBID) 100 MG capsule Take 1 capsule (100 mg total) by mouth 2 (two) times daily. 11/19/21   Lamptey, Myrene Galas, MD  oxybutynin (DITROPAN) 5 MG tablet Take 1 tablet (5 mg total) by mouth every 8 (eight) hours as needed for bladder spasms. 10/09/21   Eulogio Bear, NP  oxyCODONE-acetaminophen (PERCOCET/ROXICET) 5-325 MG tablet Take 1 tablet by mouth every 6 (six) hours as needed for severe pain. Patient not taking: Reported on 10/21/2021 10/03/21   Davonna Belling, MD  potassium chloride SA (KLOR-CON M) 20 MEQ tablet Take 1 tablet (20 mEq total) by mouth 2 (two) times daily. 09/22/21   Fransico Meadow, PA-C  pramoxine-hydrocortisone (PROCTOCREAM-HC) 1-1 % rectal cream Place 1 application rectally 2 (two) times daily. 12/03/20   Estill Dooms, NP  promethazine (PHENERGAN) 25 MG suppository Place 1 suppository (25 mg total) rectally every 8 (eight) hours as needed for up to 12 doses for nausea or vomiting. Patient not taking: Reported on 10/21/2021 10/08/21   Wyvonnia Dusky, MD  promethazine (PHENERGAN) 25 MG tablet Take 1 tablet (25 mg  total) by mouth every 6 (six) hours as needed for nausea or vomiting. 09/29/21   Truddie Hidden, MD  promethazine-dextromethorphan (PROMETHAZINE-DM) 6.25-15 MG/5ML syrup Take 2.5 mLs by mouth 3 (three) times daily as needed for cough. 01/19/22   Jaynee Eagles, PA-C  pseudoephedrine (SUDAFED) 60 MG tablet Take 1 tablet (60 mg total) by mouth every 8 (eight) hours as needed for congestion. 01/19/22   Jaynee Eagles, PA-C  tamsulosin (FLOMAX) 0.4 MG CAPS capsule Take 1 capsule (0.4 mg total) by mouth daily after supper. 10/21/21   McKenzie,  Candee Furbish, MD  valACYclovir (VALTREX) 1000 MG tablet Take 1 tablet (1,000 mg total) by mouth daily. 08/15/20   Florian Buff, MD  zolpidem (AMBIEN) 10 MG tablet Take 1 tablet (10 mg total) by mouth at bedtime. 07/21/20   Cory Munch, PA-C    Family History Family History  Problem Relation Age of Onset   Diabetes Maternal Uncle    Kidney disease Maternal Uncle        renal failure/dialysis   Heart attack Maternal Grandmother     Social History Social History   Tobacco Use   Smoking status: Never   Smokeless tobacco: Never  Vaping Use   Vaping Use: Never used  Substance Use Topics   Alcohol use: No   Drug use: No     Allergies   Ondansetron hcl, Fentanyl, and Penicillins   Review of Systems Review of Systems Per HPI  Physical Exam Triage Vital Signs ED Triage Vitals  Enc Vitals Group     BP 01/28/22 1539 (!) 166/93     Pulse Rate 01/28/22 1539 (!) 101     Resp 01/28/22 1539 18     Temp 01/28/22 1539 98.6 F (37 C)     Temp Source 01/28/22 1539 Oral     SpO2 01/28/22 1539 99 %     Weight --      Height --      Head Circumference --      Peak Flow --      Pain Score 01/28/22 1543 3     Pain Loc --      Pain Edu? --      Excl. in [place]? --    No data found.  Updated Vital Signs BP (!) 166/93 (BP Location: Right Arm)   Pulse (!) 101   Temp 98.6 F (37 C) (Oral)   Resp 18   SpO2 99%   Visual Acuity Right Eye Distance:   Left  Eye Distance:   Bilateral Distance:    Right Eye Near:   Left Eye Near:    Bilateral Near:     Physical Exam Vitals and nursing note reviewed.  Constitutional:      General: She is not in acute distress.    Appearance: Normal appearance.  HENT:     Head: Normocephalic.     Right Ear: Tympanic membrane, ear canal and external ear normal.     Left Ear: Tympanic membrane, ear canal and external ear normal.     Nose: Nose normal.     Mouth/Throat:     Mouth: Mucous membranes are moist.     Pharynx: No oropharyngeal exudate or posterior oropharyngeal erythema.  Eyes:     Extraocular Movements: Extraocular movements intact.     Conjunctiva/sclera: Conjunctivae normal.     Pupils: Pupils are equal, round, and reactive to light.  Cardiovascular:     Rate and Rhythm: Normal rate.  Pulmonary:     Effort: Pulmonary effort is normal.     Breath sounds: Normal breath sounds.  Abdominal:     General: Bowel sounds are normal.     Palpations: Abdomen is soft.  Musculoskeletal:     Cervical back: Normal range of motion.  Lymphadenopathy:     Cervical: No cervical adenopathy.  Skin:    General: Skin is warm and dry.  Neurological:     General: No focal deficit present.     Mental Status: She is alert and oriented to person, place, and time.  Psychiatric:        Mood and Affect: Mood normal.        Behavior: Behavior normal.      UC Treatments / Results  Labs (all labs ordered are listed, but only abnormal results are displayed) Labs Reviewed  POCT FASTING CBG [facility] MANUAL ENTRY - Abnormal; Notable for the following components:      Result Value   POCT Glucose ([facility]) 63 (*)    All other components within normal limits  POCT FASTING CBG [facility] MANUAL ENTRY - Abnormal; Notable for the following components:   POCT Glucose ([facility]) 176 (*)    All other components within normal limits  RESP PANEL BY RT-PCR (FLU A&B, COVID) [id number]    EKG   Radiology DG Chest 2 View  Result Date:  01/28/2022 CLINICAL DATA:  Shortness of breath. EXAM: CHEST - 2 VIEW COMPARISON:  Chest two views 01/23/2022 FINDINGS: Cardiac silhouette and mediastinal contours are within normal limits. The lungs are clear. No pleural effusion or pneumothorax. No acute skeletal abnormality. Bilateral nipple piercings are incidentally noted. IMPRESSION: No active cardiopulmonary disease. Electronically Signed   By: Yvonne Kendall M.D.   On: 01/28/2022 16:59    Procedures Procedures (including critical care time)  Medications Ordered in UC Medications - No data to display  Initial Impression / Assessment and Plan / UC Course  I have reviewed the triage vital signs and the nursing notes.  Pertinent labs & imaging results that were available during my care of the patient were reviewed by me and considered in my medical decision making (see chart for details).  Patient presents with what appears to be continued COVID symptoms.  On exam, her vital signs are stable, she is in no acute distress.  Fingerstick blood glucose was 63 when checked.  Patient was given oral hydration to elevate her blood glucose level.  Blood glucose on recheck was 176. Discussed with patient that she most likely is experiencing continued COVID symptoms.  Chest x-ray was also performed which was negative for cardiopulmonary disease.  COVID/flu test is pending.  Test was repeated to see if patient has influenza, I am aware that the COVID test will most likely result positive at this time.  Patient was advised to continue symptomatic treatment to include increasing fluids and allowing for plenty of rest.  Patient was given a prescription for test strips so she can continue to monitor her blood glucose levels.  Patient was advised to follow-up with her primary care physician within the next 24 to 48 hours for reevaluation.  Patient was also advised of when she may need to go to the emergency department, if symptoms worsen.  Patient was given a work note  for 3 days.  Patient verbalizes understanding.  All questions were answered. Final Clinical Impressions(s) / UC Diagnoses   Final diagnoses:  COVID-19  Flu-like symptoms  Dyspnea, unspecified type  Diarrhea, unspecified type  Hypoglycemia     Discharge Instructions      COVID/flu test is pending.  I am aware that you do have COVID, but I would like to see if you have developed influenza as well.  You will be contacted if the results of the test are positive. Use medication as prescribed. Increase fluids and allow for plenty of rest. Try to drink at least 8-10 8 ounce glasses of water to remain well-hydrated. Continue to monitor your blood glucose levels.  A prescription has been provided for your test strips.  Once you run  out, please follow-up with your primary care physician for a refill. Go to the emergency department if you experience worsening shortness of breath, difficulty breathing, or other concerns. I am providing information for you on signs of hyperglycemia and hypoglycemia.  Please be mindful of the symptoms and know what to do.     ED Prescriptions     Medication Sig Dispense Auth. Provider   albuterol (VENTOLIN HFA) 108 (90 Base) MCG/ACT inhaler Inhale 2 puffs into the lungs every 6 (six) hours as needed for wheezing or shortness of breath. 8 g Sosie Gato-Warren, Alda Lea, NP      PDMP not reviewed this encounter.   Tish Men, NP 01/28/22 1718    Tish Men, NP 01/28/22 1927

## 2022-01-28 NOTE — ED Triage Notes (Signed)
Positive for covid back on sept 27 .  Chills, nasal drainage, diarrhea, body aches that started today.   has been taking mucinex

## 2022-01-28 NOTE — Discharge Instructions (Addendum)
COVID/flu test is pending.  I am aware that you do have COVID, but I would like to see if you have developed influenza as well.  You will be contacted if the results of the test are positive. Use medication as prescribed. Increase fluids and allow for plenty of rest. Try to drink at least 8-10 8 ounce glasses of water to remain well-hydrated. Continue to monitor your blood glucose levels.  A prescription has been provided for your test strips.  Once you run out, please follow-up with your primary care physician for a refill. Go to the emergency department if you experience worsening shortness of breath, difficulty breathing, or other concerns. I am providing information for you on signs of hyperglycemia and hypoglycemia.  Please be mindful of the symptoms and know what to do.

## 2022-01-28 NOTE — ED Notes (Signed)
Patient given pepsi to drink

## 2022-01-29 ENCOUNTER — Emergency Department (HOSPITAL_COMMUNITY)
Admission: EM | Admit: 2022-01-29 | Discharge: 2022-01-29 | Disposition: A | Discharge status: Home / Self Care | Payer: Medicaid Other | Attending: Emergency Medicine | Admitting: Emergency Medicine

## 2022-01-29 ENCOUNTER — Emergency Department (HOSPITAL_COMMUNITY): Payer: Medicaid Other

## 2022-01-29 ENCOUNTER — Other Ambulatory Visit: Payer: Self-pay

## 2022-01-29 ENCOUNTER — Encounter (HOSPITAL_COMMUNITY): Payer: Self-pay | Admitting: *Deleted

## 2022-01-29 DIAGNOSIS — U071 COVID-19: Secondary | ICD-10-CM | POA: Diagnosis not present

## 2022-01-29 DIAGNOSIS — E119 Type 2 diabetes mellitus without complications: Secondary | ICD-10-CM | POA: Diagnosis not present

## 2022-01-29 DIAGNOSIS — Z7984 Long term (current) use of oral hypoglycemic drugs: Secondary | ICD-10-CM | POA: Diagnosis not present

## 2022-01-29 DIAGNOSIS — R059 Cough, unspecified: Secondary | ICD-10-CM | POA: Diagnosis present

## 2022-01-29 DIAGNOSIS — Z79899 Other long term (current) drug therapy: Secondary | ICD-10-CM | POA: Insufficient documentation

## 2022-01-29 LAB — CBC WITH DIFFERENTIAL/PLATELET
Abs Immature Granulocytes: 0.12 10*3/uL — ABNORMAL HIGH (ref 0.00–0.07)
Basophils Absolute: 0 10*3/uL (ref 0.0–0.1)
Basophils Relative: 0 %
Eosinophils Absolute: 0 10*3/uL (ref 0.0–0.5)
Eosinophils Relative: 0 %
HCT: 41.5 % (ref 36.0–46.0)
Hemoglobin: 13.6 g/dL (ref 12.0–15.0)
Immature Granulocytes: 1 %
Lymphocytes Relative: 12 %
Lymphs Abs: 1.6 10*3/uL (ref 0.7–4.0)
MCH: 26.8 pg (ref 26.0–34.0)
MCHC: 32.8 g/dL (ref 30.0–36.0)
MCV: 81.7 fL (ref 80.0–100.0)
Monocytes Absolute: 0.6 10*3/uL (ref 0.1–1.0)
Monocytes Relative: 4 %
Neutro Abs: 10.9 10*3/uL — ABNORMAL HIGH (ref 1.7–7.7)
Neutrophils Relative %: 83 %
Platelets: 312 10*3/uL (ref 150–400)
RBC: 5.08 MIL/uL (ref 3.87–5.11)
RDW: 14.5 % (ref 11.5–15.5)
WBC: 13.2 10*3/uL — ABNORMAL HIGH (ref 4.0–10.5)
nRBC: 0 % (ref 0.0–0.2)

## 2022-01-29 LAB — COMPREHENSIVE METABOLIC PANEL
ALT: 24 U/L (ref 0–44)
AST: 17 U/L (ref 15–41)
Albumin: 4.3 g/dL (ref 3.5–5.0)
Alkaline Phosphatase: 85 U/L (ref 38–126)
Anion gap: 10 (ref 5–15)
BUN: 13 mg/dL (ref 6–20)
CO2: 23 mmol/L (ref 22–32)
Calcium: 9.2 mg/dL (ref 8.9–10.3)
Chloride: 102 mmol/L (ref 98–111)
Creatinine, Ser: 0.77 mg/dL (ref 0.44–1.00)
GFR, Estimated: >60 mL/min (ref >60)
Glucose, Bld: 114 mg/dL — ABNORMAL HIGH (ref 70–99)
Potassium: 4.3 mmol/L (ref 3.5–5.1)
Sodium: 135 mmol/L (ref 135–145)
Total Bilirubin: 0.7 mg/dL (ref 0.3–1.2)
Total Protein: 7.9 g/dL (ref 6.5–8.1)

## 2022-01-29 LAB — CBG MONITORING, ED: Glucose-Capillary: 127 mg/dL — ABNORMAL HIGH (ref 70–99)

## 2022-01-29 NOTE — Discharge Instructions (Addendum)
Your testing has not revealed any specific abnormalities.  Your blood sugar is normal, your oxygen is normal, your chest x-ray is normal.  You may be sick for another several days.  See your doctor in follow-up this week, ER for severe worsening symptoms  You the albuterol inhaler that you have at home, 2 puffs every 4 hours as needed

## 2022-01-29 NOTE — ED Provider Notes (Signed)
[facility] EMERGENCY DEPARTMENT Provider Note   CSN: [id number] Arrival date & time: 01/29/22  1206     History  Chief Complaint  Patient presents with   Cough    Janice Taylor is a 31 y.o. female.   Cough  This patient is a 31 year old female well-known to the emergency department, she has anxiety, diabetes, she presents with a complaint of coughing and shortness of breath, she states that this has been going on for couple of weeks to some degree on and off and ultimately has tested positive for COVID-19.  She was seen at the urgent care yesterday where she had another positive test.  She is concerned that there may be something more going on because she is still feeling this after 2 weeks.  She does not have any fevers, she does not have any vomiting or diarrhea.  She states she does not have an appetite but is able to eat and drink.  She has no urinary symptoms, no rashes to the skin, she has already completed a course of Paxlovid and has an albuterol inhaler at home she is not using it.    Home Medications Prior to Admission medications   Medication Sig Start Date End Date Taking? Authorizing Provider  albuterol (VENTOLIN HFA) 108 (90 Base) MCG/ACT inhaler Inhale 2 puffs into the lungs every 6 (six) hours as needed for wheezing or shortness of breath. 01/28/22   Leath-Warren, Alda Lea, NP  ALPRAZolam Duanne Moron) 1 MG tablet Take 1 mg by mouth 3 (three) times daily as needed. 11/21/20   [provider]  alprazolam Duanne Moron) 2 MG tablet Take 2 mg by mouth 3 (three) times daily as needed. 10/07/21   [provider]  amitriptyline (ELAVIL) 25 MG tablet Take by mouth. 09/30/21   [provider]  amLODipine (NORVASC) 10 MG tablet Take 10 mg by mouth daily. 02/14/20   [provider]  cetirizine (ZYRTEC ALLERGY) 10 MG tablet Take 1 tablet (10 mg total) by mouth daily. 01/19/22   Jaynee Eagles, PA-C  cyclobenzaprine (FLEXERIL) 10 MG tablet TAKE 1 TABLET EVERY 8  HOURS AS NEEDED FOR MUSCLE SPASMS. 05/07/21   Florian Buff, MD  dicyclomine (BENTYL) 20 MG tablet Take 20 mg by mouth 4 (four) times daily. 11/20/20   [provider]  escitalopram (LEXAPRO) 10 MG tablet  09/17/20   [provider]  fluconazole (DIFLUCAN) 150 MG tablet Take 1 tablet (150 mg total) by mouth once a week. 01/19/22   Jaynee Eagles, PA-C  hydrochlorothiazide (HYDRODIURIL) 25 MG tablet Take 25 mg by mouth daily.    [provider]  hydrocortisone (ANUSOL-HC) 25 MG suppository Place 1 suppository (25 mg total) rectally 2 (two) times daily. 12/03/20   Estill Dooms, NP  ibuprofen (ADVIL) 800 MG tablet Take 800 mg by mouth every 8 (eight) hours as needed.    [provider]  Levonorgestrel (KYLEENA IU) by Intrauterine route. Patient not taking: Reported on 10/21/2021    [provider]  lidocaine (XYLOCAINE) 5 % ointment Apply topically 4 (four) times daily as needed. 12/03/20   Estill Dooms, NP  metFORMIN (GLUCOPHAGE) 500 MG tablet Take 1 tablet (500 mg total) by mouth 2 (two) times daily with a meal. 04/17/21   Isla Pence, MD  metroNIDAZOLE (FLAGYL) 500 MG tablet Take 1 tablet (500 mg total) by mouth 2 (two) times daily with a meal. DO NOT CONSUME ALCOHOL WHILE TAKING THIS MEDICATION. 01/19/22   Jaynee Eagles, PA-C  naproxen (NAPROSYN) 500 MG tablet Take 1 tablet (500 mg total) by mouth 2 (two) times daily with a meal. 01/19/22   Jaynee Eagles, PA-C  nitrofurantoin, macrocrystal-monohydrate, (MACROBID) 100 MG capsule Take 1 capsule (100 mg total) by mouth 2 (two) times daily. 11/19/21   Lamptey, Myrene Galas, MD  oxybutynin (DITROPAN) 5 MG tablet Take 1 tablet (5 mg total) by mouth every 8 (eight) hours as needed for bladder spasms. 10/09/21   Eulogio Bear, NP  oxyCODONE-acetaminophen (PERCOCET/ROXICET) 5-325 MG tablet Take 1 tablet by mouth every 6 (six) hours as needed for severe pain. Patient not taking: Reported on 10/21/2021 10/03/21    Davonna Belling, MD  potassium chloride SA (KLOR-CON M) 20 MEQ tablet Take 1 tablet (20 mEq total) by mouth 2 (two) times daily. 09/22/21   Fransico Meadow, PA-C  pramoxine-hydrocortisone (PROCTOCREAM-HC) 1-1 % rectal cream Place 1 application rectally 2 (two) times daily. 12/03/20   Estill Dooms, NP  promethazine (PHENERGAN) 25 MG suppository Place 1 suppository (25 mg total) rectally every 8 (eight) hours as needed for up to 12 doses for nausea or vomiting. Patient not taking: Reported on 10/21/2021 10/08/21   Wyvonnia Dusky, MD  promethazine (PHENERGAN) 25 MG tablet Take 1 tablet (25 mg total) by mouth every 6 (six) hours as needed for nausea or vomiting. 09/29/21   Truddie Hidden, MD  promethazine-dextromethorphan (PROMETHAZINE-DM) 6.25-15 MG/5ML syrup Take 2.5 mLs by mouth 3 (three) times daily as needed for cough. 01/19/22   Jaynee Eagles, PA-C  pseudoephedrine (SUDAFED) 60 MG tablet Take 1 tablet (60 mg total) by mouth every 8 (eight) hours as needed for congestion. 01/19/22   Jaynee Eagles, PA-C  tamsulosin (FLOMAX) 0.4 MG CAPS capsule Take 1 capsule (0.4 mg total) by mouth daily after supper. 10/21/21   McKenzie, Candee Furbish, MD  valACYclovir (VALTREX) 1000 MG tablet Take 1 tablet (1,000 mg total) by mouth daily. 08/15/20   Florian Buff, MD  zolpidem (AMBIEN) 10 MG tablet Take 1 tablet (10 mg total) by mouth at bedtime. 07/21/20   Cory Munch, PA-C      Allergies    Ondansetron hcl, Fentanyl, and Penicillins    Review of Systems   Review of Systems  Respiratory:  Positive for cough.   All other systems reviewed and are negative.   Physical Exam Updated Vital Signs BP (!) 138/95   Pulse (!) 110   Temp 98.9 F (37.2 C) (Oral)   Resp 18   Ht 1.6 m ('5\' 3"'$ )   Wt (!) 147.4 kg   SpO2 99%   BMI 57.57 kg/m  Physical Exam Vitals and nursing note reviewed.  Constitutional:      General: She is not in acute distress.    Appearance: She is well-developed.  HENT:     Head:  Normocephalic and atraumatic.     Mouth/Throat:     Pharynx: No oropharyngeal exudate.  Eyes:     General: No scleral icterus.       Right eye: No discharge.        Left eye: No discharge.     Conjunctiva/sclera: Conjunctivae normal.     Pupils: Pupils are equal, round, and reactive to light.  Neck:     Thyroid: No thyromegaly.     Vascular: No JVD.  Cardiovascular:     Rate and Rhythm: Normal rate and regular rhythm.     Heart sounds: Normal heart sounds. No murmur heard.    No friction  rub. No gallop.     Comments: Heart rate is 95 to 100 bpm and sinus rhythm on the monitor Pulmonary:     Effort: Pulmonary effort is normal. No respiratory distress.     Breath sounds: Normal breath sounds. No wheezing or rales.  Abdominal:     General: Bowel sounds are normal. There is no distension.     Palpations: Abdomen is soft. There is no mass.     Tenderness: There is no abdominal tenderness.  Musculoskeletal:        General: No tenderness. Normal range of motion.     Cervical back: Normal range of motion and neck supple.  Lymphadenopathy:     Cervical: No cervical adenopathy.  Skin:    General: Skin is warm and dry.     Findings: No erythema or rash.  Neurological:     Mental Status: She is alert.     Coordination: Coordination normal.  Psychiatric:        Behavior: Behavior normal.     ED Results / Procedures / Treatments   Labs (all labs ordered are listed, but only abnormal results are displayed) Labs Reviewed  CBC WITH DIFFERENTIAL/PLATELET - Abnormal; Notable for the following components:      Result Value   WBC 13.2 (*)    Neutro Abs 10.9 (*)    Abs Immature Granulocytes 0.12 (*)    All other components within normal limits  COMPREHENSIVE METABOLIC PANEL - Abnormal; Notable for the following components:   Glucose, Bld 114 (*)    All other components within normal limits  CBG MONITORING, ED - Abnormal; Notable for the following components:   Glucose-Capillary 127  (*)    All other components within normal limits    EKG EKG Interpretation  Date/Time:  Friday January 29 2022 12:41:45 EDT Ventricular Rate:  111 PR Interval:  114 QRS Duration: 86 QT Interval:  328 QTC Calculation: 446 R Axis:   95 Text Interpretation: Sinus tachycardia Rightward axis Cannot rule out Inferior infarct , age undetermined Abnormal ECG When compared with ECG of 28-Sep-2021 18:44, No significant change since last tracing Confirmed by Aletta Edouard [phone]) on 01/29/2022 1:39:06 PM  Radiology DG Chest 2 View  Result Date: 01/29/2022 CLINICAL DATA:  Hypoglycemia. Shortness of breath. COVID positive 01/20/2022 EXAM: CHEST - 2 VIEW COMPARISON:  Two-view chest x-ray 01/28/2022. FINDINGS: The heart size and mediastinal contours are within normal limits. Both lungs are clear. The visualized skeletal structures are unremarkable. IMPRESSION: No active cardiopulmonary disease. Electronically Signed   By: San Morelle M.D.   On: 01/29/2022 12:59   DG Chest 2 View  Result Date: 01/28/2022 CLINICAL DATA:  Shortness of breath. EXAM: CHEST - 2 VIEW COMPARISON:  Chest two views 01/23/2022 FINDINGS: Cardiac silhouette and mediastinal contours are within normal limits. The lungs are clear. No pleural effusion or pneumothorax. No acute skeletal abnormality. Bilateral nipple piercings are incidentally noted. IMPRESSION: No active cardiopulmonary disease. Electronically Signed   By: Yvonne Kendall M.D.   On: 01/28/2022 16:59    Procedures Procedures    Medications Ordered in ED Medications - No data to display  ED Course/ Medical Decision Making/ A&P                           Medical Decision Making Amount and/or Complexity of Data Reviewed Labs: ordered. Radiology: ordered.   As poorly as this patient feels she has a totally normal exam, her x-ray  which I personally viewed and interpreted shows no signs of pneumonia, her lab work shows that she has a mild leukocytosis but no  signs of hyperglycemia, hypoglycemia, significant electrolyte abnormalities or anemia.  She has no fever, she is not significantly tachycardic, she is not hypotensive.  She has already had a CT angiogram within the last 2 weeks which was negative for pulmonary embolism.  This patient does not appear ill in general does not need to be admitted to the hospital does not need further testing or stabilizing care and has been encouraged to use her albuterol inhaler.  She is agreeable to the plan.  She understands indications for return.        Final Clinical Impression(s) / ED Diagnoses Final diagnoses:  [id number]    Rx / DC Orders ED Discharge Orders     None         Noemi Chapel, MD 01/29/22 1424

## 2022-01-29 NOTE — ED Triage Notes (Signed)
Pt states her cbg was low, pt states she had been eating. Pt with continued SOB.  Covid positive on 9/27 per pt, covid positive test done yesterday.  Pt was seen at [facility] and chest xray was done.

## 2022-02-26 ENCOUNTER — Ambulatory Visit
Admission: EM | Admit: 2022-02-26 | Discharge: 2022-02-26 | Disposition: A | Discharge status: Home / Self Care | Payer: Medicaid Other | Attending: Family Medicine | Admitting: Family Medicine

## 2022-02-26 DIAGNOSIS — J069 Acute upper respiratory infection, unspecified: Secondary | ICD-10-CM | POA: Diagnosis not present

## 2022-02-26 MED ORDER — PROMETHAZINE-DM 6.25-15 MG/5ML PO SYRP: 5.0000 mL | ORAL_SOLUTION | Freq: Four times a day (QID) | ORAL | 0 refills | Status: DC | PRN

## 2022-02-26 MED ORDER — FLUTICASONE PROPIONATE 50 MCG/ACT NA SUSP: 1.0000 | Freq: Two times a day (BID) | NASAL | 2 refills | Status: DC

## 2022-02-26 MED ORDER — METHYLPREDNISOLONE SODIUM SUCC 125 MG IJ SOLR
60.0000 mg | Freq: Once | INTRAMUSCULAR | Status: AC
  Administered 2022-02-26: 60 mg via INTRAMUSCULAR

## 2022-02-26 NOTE — ED Triage Notes (Signed)
Runny nose, cough, horse voice started 3 days ago. Just got over covid. Been taking left over prednisone from when she had covid and mucinex.

## 2022-02-26 NOTE — ED Provider Notes (Signed)
[facility]    CSN: [id number] Arrival date & time: 02/26/22  1402      History   Chief Complaint Chief Complaint  Patient presents with   Cough   Nasal Congestion   Hoarse    HPI Janice Taylor is a 31 y.o. female.   Patient presenting today with 3-day history of runny nose, hacking cough, chest tightness, hoarse voice.  Denies fever, chills, chest pain, shortness of breath, abdominal pain, nausea vomiting or diarrhea.  States she had COVID last month and has been taking some leftover prednisone that she had from her COVID-19 infection but this has not been helping much.  Also taking Mucinex with no relief.  History of seasonal allergies on antihistamines.  No known sick contacts recently.    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    Leg swelling    Obesity 01/05/2013   Thrombocytopenia ([place]) 11/25/2011   Consistent with ITP    Patient Active Problem List   Diagnosis Date Noted   Anterior anal fissure 12/09/2020   Positive fecal occult blood test 12/03/2020   Boil of groin 03/31/2020   Hemorrhoids 03/31/2020   Hypertension 03/31/2020   Encounter for IUD insertion 01/22/2019   Anxiety 11/07/2018   History of ITP 11/25/2011    Past Surgical History:  Procedure Laterality Date   CESAREAN SECTION N/A 12/18/2018   Procedure: CESAREAN SECTION;  Surgeon: Woodroe Mode, MD;  Location: [facility] LD ORS;  Service: Obstetrics;  Laterality: N/A;   TONSILECTOMY, ADENOIDECTOMY, BILATERAL MYRINGOTOMY AND TUBES      OB History     Gravida  3   Para  2   Term  1   Preterm  1   AB  1   Living  2      SAB  1   IAB      Ectopic      Multiple  0   Live Births  2            Home Medications     Prior to Admission medications   Medication Sig Start Date End Date Taking? Authorizing Provider  fluticasone (FLONASE) 50 MCG/ACT nasal spray Place 1 spray into both nostrils 2 (two) times daily. 02/26/22  Yes Volney American, PA-C  potassium chloride SA (KLOR-CON M) 20 MEQ tablet Take 1 tablet (20 mEq total) by mouth 2 (two) times daily. 09/22/21  Yes Fransico Meadow, PA-C  promethazine-dextromethorphan (PROMETHAZINE-DM) 6.25-15 MG/5ML syrup Take 5 mLs by mouth 4 (four) times daily as needed. 02/26/22  Yes Volney American, PA-C  albuterol (VENTOLIN HFA) 108 (90 Base) MCG/ACT inhaler Inhale 2 puffs into the lungs every 6 (six) hours as needed for wheezing or shortness of breath. 01/28/22   Leath-Warren, Alda Lea, NP  ALPRAZolam Duanne Moron) 1 MG tablet Take 1 mg by mouth 3 (three) times daily as needed. 11/21/20   [provider]  alprazolam Duanne Moron) 2 MG tablet Take 2 mg by mouth 3 (three) times daily as needed. 10/07/21   [provider]  amitriptyline (ELAVIL) 25 MG tablet Take by mouth. 09/30/21   [provider]  amLODipine (NORVASC) 10 MG tablet Take  10 mg by mouth daily. 02/14/20   [provider]  cetirizine (ZYRTEC ALLERGY) 10 MG tablet Take 1 tablet (10 mg total) by mouth daily. 01/19/22   Jaynee Eagles, PA-C  cyclobenzaprine (FLEXERIL) 10 MG tablet TAKE 1 TABLET EVERY 8 HOURS AS NEEDED FOR MUSCLE SPASMS. 05/07/21   Florian Buff, MD  dicyclomine (BENTYL) 20 MG tablet Take 20 mg by mouth 4 (four) times daily. 11/20/20   [provider]  escitalopram (LEXAPRO) 10 MG tablet  09/17/20   [provider]  fluconazole (DIFLUCAN) 150 MG tablet Take 1 tablet (150 mg total) by mouth once a week. 01/19/22   Jaynee Eagles, PA-C  hydrochlorothiazide (HYDRODIURIL) 25 MG tablet Take 25 mg by mouth daily.    [provider]  hydrocortisone (ANUSOL-HC) 25 MG suppository Place 1 suppository (25 mg total) rectally 2 (two) times daily. 12/03/20    Estill Dooms, NP  ibuprofen (ADVIL) 800 MG tablet Take 800 mg by mouth every 8 (eight) hours as needed.    [provider]  Levonorgestrel (KYLEENA IU) by Intrauterine route. Patient not taking: Reported on 10/21/2021    [provider]  lidocaine (XYLOCAINE) 5 % ointment Apply topically 4 (four) times daily as needed. 12/03/20   Estill Dooms, NP  metFORMIN (GLUCOPHAGE) 500 MG tablet Take 1 tablet (500 mg total) by mouth 2 (two) times daily with a meal. 04/17/21   Isla Pence, MD  metroNIDAZOLE (FLAGYL) 500 MG tablet Take 1 tablet (500 mg total) by mouth 2 (two) times daily with a meal. DO NOT CONSUME ALCOHOL WHILE TAKING THIS MEDICATION. 01/19/22   Jaynee Eagles, PA-C  naproxen (NAPROSYN) 500 MG tablet Take 1 tablet (500 mg total) by mouth 2 (two) times daily with a meal. 01/19/22   Jaynee Eagles, PA-C  nitrofurantoin, macrocrystal-monohydrate, (MACROBID) 100 MG capsule Take 1 capsule (100 mg total) by mouth 2 (two) times daily. 11/19/21   Lamptey, Myrene Galas, MD  oxybutynin (DITROPAN) 5 MG tablet Take 1 tablet (5 mg total) by mouth every 8 (eight) hours as needed for bladder spasms. 10/09/21   Eulogio Bear, NP  oxyCODONE-acetaminophen (PERCOCET/ROXICET) 5-325 MG tablet Take 1 tablet by mouth every 6 (six) hours as needed for severe pain. Patient not taking: Reported on 10/21/2021 10/03/21   Davonna Belling, MD  pramoxine-hydrocortisone (PROCTOCREAM-HC) 1-1 % rectal cream Place 1 application rectally 2 (two) times daily. 12/03/20   Estill Dooms, NP  promethazine (PHENERGAN) 25 MG suppository Place 1 suppository (25 mg total) rectally every 8 (eight) hours as needed for up to 12 doses for nausea or vomiting. Patient not taking: Reported on 10/21/2021 10/08/21   Wyvonnia Dusky, MD  promethazine (PHENERGAN) 25 MG tablet Take 1 tablet (25 mg total) by mouth every 6 (six) hours as needed for nausea or vomiting. 09/29/21   Truddie Hidden, MD   promethazine-dextromethorphan (PROMETHAZINE-DM) 6.25-15 MG/5ML syrup Take 2.5 mLs by mouth 3 (three) times daily as needed for cough. 01/19/22   Jaynee Eagles, PA-C  pseudoephedrine (SUDAFED) 60 MG tablet Take 1 tablet (60 mg total) by mouth every 8 (eight) hours as needed for congestion. 01/19/22   Jaynee Eagles, PA-C  tamsulosin (FLOMAX) 0.4 MG CAPS capsule Take 1 capsule (0.4 mg total) by mouth daily after supper. 10/21/21   McKenzie, Candee Furbish, MD  valACYclovir (VALTREX) 1000 MG tablet Take 1 tablet (1,000 mg total) by mouth daily. 08/15/20   Florian Buff, MD  zolpidem (AMBIEN) 10 MG tablet Take 1  tablet (10 mg total) by mouth at bedtime. 07/21/20   Cory Munch, PA-C    Family History Family History  Problem Relation Age of Onset   Diabetes Maternal Uncle    Kidney disease Maternal Uncle        renal failure/dialysis   Heart attack Maternal Grandmother     Social History Social History   Tobacco Use   Smoking status: Never   Smokeless tobacco: Never  Vaping Use   Vaping Use: Never used  Substance Use Topics   Alcohol use: No   Drug use: No     Allergies   Ondansetron hcl, Fentanyl, and Penicillins   Review of Systems Review of Systems Per HPI  Physical Exam Triage Vital Signs ED Triage Vitals  Enc Vitals Group     BP 02/26/22 1435 (!) 151/92     Pulse Rate 02/26/22 1435 (!) 108     Resp 02/26/22 1435 18     Temp 02/26/22 1435 98.3 F (36.8 C)     Temp Source 02/26/22 1435 Oral     SpO2 02/26/22 1435 96 %     Weight --      Height --      Head Circumference --      Peak Flow --      Pain Score 02/26/22 1436 6     Pain Loc --      Pain Edu? --      Excl. in [place]? --    No data found.  Updated Vital Signs BP (!) 151/92 (BP Location: Right Arm)   Pulse (!) 108   Temp 98.3 F (36.8 C) (Oral)   Resp 18   LMP  (LMP Unknown)   SpO2 96%   Visual Acuity Right Eye Distance:   Left Eye Distance:   Bilateral Distance:    Right Eye Near:   Left Eye Near:     Bilateral Near:     Physical Exam Vitals and nursing note reviewed.  Constitutional:      Appearance: Normal appearance. She is not ill-appearing.  HENT:     Head: Atraumatic.     Right Ear: Tympanic membrane and external ear normal.     Left Ear: Tympanic membrane and external ear normal.     Nose: Rhinorrhea present.     Mouth/Throat:     Mouth: Mucous membranes are moist.     Pharynx: Posterior oropharyngeal erythema present.  Eyes:     Extraocular Movements: Extraocular movements intact.     Conjunctiva/sclera: Conjunctivae normal.  Cardiovascular:     Rate and Rhythm: Normal rate and regular rhythm.     Heart sounds: Normal heart sounds.  Pulmonary:     Effort: Pulmonary effort is normal.     Breath sounds: Normal breath sounds. No wheezing or rales.  Musculoskeletal:        General: Normal range of motion.     Cervical back: Normal range of motion and neck supple.  Skin:    General: Skin is warm and dry.  Neurological:     Mental Status: She is alert and oriented to person, place, and time.  Psychiatric:        Mood and Affect: Mood normal.        Thought Content: Thought content normal.        Judgment: Judgment normal.      UC Treatments / Results  Labs (all labs ordered are listed, but only abnormal results are displayed) Labs Reviewed -  No data to display  EKG   Radiology No results found.  Procedures Procedures (including critical care time)  Medications Ordered in UC Medications  methylPREDNISolone sodium succinate (SOLU-MEDROL) 125 mg/2 mL injection 60 mg (60 mg Intramuscular Given 02/26/22 1508)    Initial Impression / Assessment and Plan / UC Course  I have reviewed the triage vital signs and the nursing notes.  Pertinent labs & imaging results that were available during my care of the patient were reviewed by me and considered in my medical decision making (see chart for details).     Mildly tachycardic and hypertensive in triage,  otherwise vital signs reassuring with normal oxygen saturation on room air.  She is in no acute distress today and very well-appearing.  Suspect seasonal allergy exacerbation versus new viral illness.  She is requesting a steroid injection, low-dose IM Solu-Medrol given in clinic and Phenergan DM, Flonase for symptomatic relief.  Discussed other supportive over-the-counter medications and home care.  Return for worsening symptoms.  Final Clinical Impressions(s) / UC Diagnoses   Final diagnoses:  Viral URI   Discharge Instructions   None    ED Prescriptions     Medication Sig Dispense Auth. Provider   promethazine-dextromethorphan (PROMETHAZINE-DM) 6.25-15 MG/5ML syrup Take 5 mLs by mouth 4 (four) times daily as needed. 100 mL Volney American, PA-C   fluticasone [facility]) 50 MCG/ACT nasal spray Place 1 spray into both nostrils 2 (two) times daily. 16 g Volney American, [place]      PDMP not reviewed this encounter.   Volney American, [place] 02/26/22 1513

## 2022-04-06 ENCOUNTER — Ambulatory Visit (HOSPITAL_COMMUNITY)
Admission: EM | Admit: 2022-04-06 | Discharge: 2022-04-06 | Disposition: A | Discharge status: Home / Self Care | Payer: Medicaid Other | Attending: Registered Nurse | Admitting: Registered Nurse

## 2022-04-06 DIAGNOSIS — F1320 Sedative, hypnotic or anxiolytic dependence, uncomplicated: Secondary | ICD-10-CM | POA: Diagnosis present

## 2022-04-06 DIAGNOSIS — F411 Generalized anxiety disorder: Secondary | ICD-10-CM

## 2022-04-06 NOTE — Discharge Instructions (Addendum)
Follow up with your primary physician and psychiatric provider:  Below are suggestions that you can talk to them about:   Complaints of withdrawal symptoms from the Xanax tapper and running out of medication prior to next appointment.  Reporting taper has going on with monthly decreases, would suggest quicker taper over the next month and: While finishing up Xanax tapper:  The following are medications that may help with anxiety and can continue once the taper is completed.   Buspar Seroquel Gabapentin Vistaril as needed. Since patient is at max dose of Cymbalta 120 mg daily would continue and add nne of the above could also be added as an adjunct, such as the Seroquel which will help with anxiety and depression.    If withdrawal symptoms worsening go to the nearest emergency room.

## 2022-04-06 NOTE — ED Notes (Signed)
Discharge instructions provided and Pt stated understanding. Pt alert, orient and ambulatory prior to d/c from facility. Personal belongings returned. Safety maintained.

## 2022-04-06 NOTE — ED Provider Notes (Signed)
Behavioral Health Urgent Care Medical Screening Exam  Patient Name: Janice Taylor MRN: [id number] Date of Evaluation: 04/06/22 Chief Complaint:   Diagnosis:  Final diagnoses:  None    History of Present illness: Janice Taylor is a 31 y.o. female. patient presented to [facility] as a walk in with complaints of running out of Xanax prior to her scheduled appointment with PCP and having withdrawal symptoms from xanax.    Janice Taylor, 31 y.o., female patient seen face to face by this provider, consulted with Dr. Melba Coon; and chart reviewed on 04/06/22.  On evaluation Janice Taylor reports she has been taking Xanax for 5 years.  Reports up until September 2023 she was taking 8 mg a day.  After changing primary care providers Janice Taylor, [place]) started a taper on her Xanax.  Reports she has gotten down to 0.5 mg 4 times a day but ran out of her medication on Sunday and does not have an appointment with her primary until Friday.  States she feels as though she may be having some withdrawal symptoms related to having a headache.  "I've been through withdrawal once before and I do not want to go through it once again.  Is there anything you can give me so that I do not have the withdrawal."  Patient informed that she could be admitted to facility based crisis for benzodiazepine withdrawal.  States she did not want to be admitted.  Inform she would need to see her primary care physician to see if he can give her a prescription for enough Xanax until she is seen on Friday or she would need to talk to her primary psychiatrist.  Also informed if withdrawal symptoms worsen she can go to the nearest emergency room.  Discussed suggestions of adjunct medications while in the process of tapering that she get also discussed with her primary care doctor.  Patient denies suicidal/self-harm/homicidal ideation, psychosis, paranoia.   During evaluation Janice Taylor is sitting upright in chair with no noted  distress.  She is alert/oriented x 4; calm/cooperative; and mood congruent with affect.  She is speaking in a clear tone at moderate volume, and normal pace; with good eye contact.  Her thought process is coherent and relevant; There is no indication that she is currently responding to internal/external stimuli or experiencing delusional thought content.  She denies suicidal/self-harm/homicidal ideation, psychosis, and paranoia.  No noted withdrawal symptoms during assessment (no tremors, diaphoresis, or complaints of nausea/vomiting, or diarrhea).  Patient has remained calm throughout assessment and has answered questions appropriately.    At this time Janice Taylor is educated and verbalizes understanding of mental health resources and other crisis services in the community. She is instructed to call 911 and present to the nearest emergency room should she experience any suicidal/homicidal ideation, auditory/visual/hallucinations, or detrimental worsening of her mental health condition or worsening of withdrawal symptoms.  She was a also advised by Probation officer that she could call the toll-free phone on back of  insurance card to assist with identifying in network counselors and agencies.     [place] [facility] from 02/26/2022 in [facility] at [facility] from 01/29/2022 in [place] [facility] from 01/28/2022 in [facility] [place] No Risk No Risk No Risk       Psychiatric Specialty Exam  Presentation  General Appearance:Appropriate for Environment; Casual  Eye Contact:Good  Speech:Clear and Coherent; Normal Rate  Speech Volume:Normal  Handedness:Right   Mood and Affect  Mood: Anxious  Affect: Congruent   Thought Process  Thought Processes: Coherent; Goal Directed  Descriptions of Associations:Intact  Orientation:Full (Time, Place and Person)  Thought Content:Logical    Hallucinations:None  Ideas of  Reference:None  Suicidal Thoughts:No  Homicidal Thoughts:No   Sensorium  Memory: Immediate Good; Recent Good; Remote Good  Judgment: Intact  Insight: Present   Executive Functions  Concentration: Good  Attention Span: Good  Recall: Good  Fund of Knowledge: Good  Language: Good   Psychomotor Activity  Psychomotor Activity: Normal   Assets  Assets: Communication Skills; Desire for Improvement; Financial Resources/Insurance; Housing; Physical Health; Resilience; Social Support; Transportation   Sleep  Sleep: Good  Number of hours: No data recorded  Nutritional Assessment (For OBS and FBC admissions only) Has the patient had a weight loss or gain of 10 pounds or more in the last 3 months?: No Has the patient had a decrease in food intake/or appetite?: No Does the patient have dental problems?: No Does the patient have eating habits or behaviors that may be indicators of an eating disorder including binging or inducing vomiting?: No Has the patient recently lost weight without trying?: 0 Has the patient been eating poorly because of a decreased appetite?: 0 Malnutrition Screening Tool Score: 0    Physical Exam: Physical Exam Vitals and nursing note reviewed. Exam conducted with a chaperone present.  Constitutional:      General: She is not in acute distress.    Appearance: Normal appearance. She is obese. She is not ill-appearing, toxic-appearing or diaphoretic.  HENT:     Head: Normocephalic and atraumatic.  Eyes:     Conjunctiva/sclera: Conjunctivae normal.  Cardiovascular:     Rate and Rhythm: Normal rate.  Pulmonary:     Effort: Pulmonary effort is normal.  Musculoskeletal:        General: Normal range of motion.     Cervical back: Normal range of motion.  Skin:    General: Skin is warm and dry.  Neurological:     Mental Status: She is alert and oriented to person, place, and time.  Psychiatric:        Attention and Perception:  Attention and perception normal. She does not perceive auditory or visual hallucinations.        Mood and Affect: Affect normal. Mood is anxious.        Speech: Speech normal.        Behavior: Behavior normal. Behavior is cooperative.        Thought Content: Thought content normal. Thought content is not paranoid or delusional. Thought content does not include homicidal or suicidal ideation.        Cognition and Memory: Cognition and memory normal.        Judgment: Judgment normal.    Review of Systems  Constitutional:  Negative for chills, diaphoresis, malaise/fatigue and weight loss.  Eyes: Negative.   Respiratory:  Negative for cough and wheezing.   Cardiovascular:  Negative for chest pain and palpitations.  Gastrointestinal:  Negative for abdominal pain, constipation, nausea and vomiting.  Genitourinary: Negative.   Musculoskeletal:  Negative for myalgias.  Skin: Negative.   Neurological:  Positive for headaches. Negative for tremors, seizures, loss of consciousness and weakness.  Psychiatric/Behavioral:  Negative for hallucinations and suicidal ideas. The patient is not nervous/anxious and does not have insomnia.    Blood pressure (!) 141/98, pulse 100, temperature 98 F (36.7 C), temperature source Oral, resp.  rate 18, SpO2 100 %. There is no height or weight on file to calculate BMI.  Musculoskeletal: Strength & Muscle Tone: within normal limits Gait & Station: normal Patient leans: N/A   [place] MSE Discharge Disposition for Follow up and Recommendations: Based on my evaluation the patient does not appear to have an emergency medical condition and can be discharged with resources and follow up care in outpatient services for Medication Management and Individual Therapy     Discharge Instructions      Follow up with your primary physician and psychiatric provider:  Below are suggestions that you can talk to them about:   Complaints of withdrawal symptoms from the Xanax  tapper and running out of medication prior to next appointment.  Reporting taper has going on with monthly decreases, would suggest quicker taper over the next month and: While finishing up Xanax tapper:  The following are medications that may help with anxiety and can continue once the taper is completed.   Buspar Seroquel Gabapentin Vistaril as needed. Since patient is at max dose of Cymbalta 120 mg daily would continue and add nne of the above could also be added as an adjunct, such as the Seroquel which will help with anxiety and depression.    If withdrawal symptoms worsening go to the nearest emergency room.         Follow-up Information     Schedule an appointment as soon as possible for a visit  with [place], [facility].   Why: In-office and online appointments available Contact information: [place] [address] [phone]         Schedule an appointment as soon as possible for a visit  with [facility], [place].   Why: Call for appointment.  Offer virtual and in person medication management Contact information: [address] [place], [place] [address] [phone]         Schedule an appointment as soon as possible for a visit  with [place].   Why: Medication management and counseling:  Should be able to get appointment within week Contact information: [place] [address] [phone]         Call  Cory Munch, PA-C.   Specialties: Physician Assistant, Internal Medicine Why: Follow up for Xanax taper. Contact information: [address] [place] [address] [phone]         Call  [place].   Why: Follow up medication management.  See suggestions listed below Contact information: [address] [place] [address] [phone]         Schedule an appointment as soon as  possible for a visit  with [facility], [facility].   Why: [place] appointment for [facility] information: [place] [address] [phone]         Schedule an appointment as soon as possible for a visit  with [place].   Contact information: [place] [address] [phone]                  Earleen Newport, NP 04/06/2022, 5:27 PM

## 2022-04-06 NOTE — Progress Notes (Signed)
   04/06/22 1542  [place] (Walk-ins at [facility] only)  How Did You Hear About [place]? Primary Care  What Is the Reason for Your Visit/Call Today? Pt is a 31 yo female who presented voluntarily and unaccompanied due to "addiction" to her anxiety medications. Pt explained that her former PCP prescribed her increasing doses of Xanax and later Oxycodone which she stateed she is now addicted to. Pt denied any withdrawal symptoms but stated that hre mother was concerned because she thinks she is "zoned out." Pt stated that she takes her prescribed medications only as they are prescribed. Pt stated she now has a new PCPwho has referred her to a Psychiatrist who she feels "does not care" because pt cannot call her and talk to her when she feels a panic attack coming on. Pt stated she wants to be weaned off her anxiety and other medications and referral to a new psychiatrist who can help her with the medications she needs for her anxiety. Pt denied current SI, HI, NSSH, AVH, paranoia and any substance use.  How Long Has This Been Causing You Problems? > than 6 months  Have You Recently Had Any Thoughts About Hurting Yourself? No  Are You Planning to Commit Suicide/Harm Yourself At This time? No  Have you Recently Had Thoughts About [place]? No  Are You Planning To Harm Someone At This Time? No  Are you currently experiencing any auditory, visual or other hallucinations? No  Have You Used Any Alcohol or Drugs in the Past 24 Hours? No  Do you have any current medical co-morbidities that require immediate attention? No  Clinician description of patient physical appearance/behavior: Pt is calm, cooperative, alert and seemed fully oriented. Pt was speaking and moving normally. Pt's mood seemed euthymic and her affect seemed congruent. Pt's judgment and insight seemed fair to good.  What Do You Feel Would Help You the Most Today? Medication(s)  If access to [facility] was not available, would  you have sought care in the Emergency Department? Yes  Determination of Need Routine (7 days)  Options For Referral Medication Management;Outpatient Therapy   Kenedee Molesky T. Mare Ferrari, [place], [facility], [facility] Triage Specialist [facility]

## 2022-07-12 ENCOUNTER — Ambulatory Visit
Admission: EM | Admit: 2022-07-12 | Discharge: 2022-07-12 | Disposition: A | Discharge status: Home / Self Care | Payer: Medicaid Other | Attending: Family Medicine | Admitting: Family Medicine

## 2022-07-12 DIAGNOSIS — K0889 Other specified disorders of teeth and supporting structures: Secondary | ICD-10-CM | POA: Diagnosis not present

## 2022-07-12 MED ORDER — KETOROLAC TROMETHAMINE 30 MG/ML IJ SOLN
30.0000 mg | Freq: Once | INTRAMUSCULAR | Status: AC
  Administered 2022-07-12: 30 mg via INTRAMUSCULAR

## 2022-07-12 MED ORDER — CLINDAMYCIN HCL 300 MG PO CAPS: 300.0000 mg | ORAL_CAPSULE | Freq: Two times a day (BID) | ORAL | 0 refills | Status: DC

## 2022-07-12 NOTE — ED Triage Notes (Signed)
Pt states she is having pain in her upper front tooth that radiates to her face and jaw on right side. Taking tylenol, ibuprofen, and amoxicillin she had left over started taking them Friday night.

## 2022-07-12 NOTE — ED Provider Notes (Signed)
[facility]    CSN: [id number] Arrival date & time: 07/12/22  1453      History   Chief Complaint Chief Complaint  Patient presents with   Facial Pain    HPI Janice Taylor is a 32 y.o. female.   Patient presenting today with several day history of pain radiating from a right upper front tooth up into her sinuses and toward her ear.  Denies drainage or bleeding to the mouth, fever, chills, hearing change.  Has been trying over-the-counter pain relievers as well as some leftover amoxicillin for the past 3 days with minimal relief.  Has a dental appointment scheduled for next Tuesday.    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    Leg swelling    Obesity 01/05/2013   Thrombocytopenia ([place]) 11/25/2011   Consistent with ITP    Patient Active Problem List   Diagnosis Date Noted   Xanax use disorder, moderate, in controlled environment, dependence ([place]) 04/06/2022   Anterior anal fissure 12/09/2020   Positive fecal occult blood test 12/03/2020   Boil of groin 03/31/2020   Hemorrhoids 03/31/2020   Hypertension 03/31/2020   Encounter for IUD insertion 01/22/2019   Anxiety 11/07/2018   History of ITP 11/25/2011    Past Surgical History:  Procedure Laterality Date   CESAREAN SECTION N/A 12/18/2018   Procedure: CESAREAN SECTION;  Surgeon: Woodroe Mode, MD;  Location: [facility] LD ORS;  Service: Obstetrics;  Laterality: N/A;   TONSILECTOMY, ADENOIDECTOMY, BILATERAL MYRINGOTOMY AND TUBES      OB History     Gravida  3   Para  2   Term  1   Preterm  1   AB  1   Living  2      SAB  1   IAB      Ectopic      Multiple  0   Live Births  2            Home Medications    Prior to Admission  medications   Medication Sig Start Date End Date Taking? Authorizing Provider  albuterol (VENTOLIN HFA) 108 (90 Base) MCG/ACT inhaler Inhale 2 puffs into the lungs every 6 (six) hours as needed for wheezing or shortness of breath. 01/28/22  Yes Leath-Warren, Alda Lea, NP  amLODipine (NORVASC) 10 MG tablet Take 10 mg by mouth daily. 02/14/20  Yes [provider]  cetirizine (ZYRTEC ALLERGY) 10 MG tablet Take 1 tablet (10 mg total) by mouth daily. 01/19/22  Yes Jaynee Eagles, PA-C  clindamycin (CLEOCIN) 300 MG capsule Take 1 capsule (300 mg total) by mouth 2 (two) times daily. 07/12/22  Yes Volney American, PA-C  hydrochlorothiazide (HYDRODIURIL) 25 MG tablet Take 25 mg by mouth daily.   Yes [provider]  potassium chloride SA (KLOR-CON M) 20 MEQ tablet Take 1 tablet (20 mEq total) by mouth 2 (two) times daily. 09/22/21  Yes Caryl Ada K, PA-C  zolpidem (AMBIEN) 10 MG tablet Take 1 tablet (10 mg total) by mouth at bedtime. 07/21/20  Yes Cory Munch, PA-C  ALPRAZolam Duanne Moron) 1 MG tablet Take 1 mg by mouth 3 (three) times daily as  needed. 11/21/20   [provider]  alprazolam Duanne Moron) 2 MG tablet Take 2 mg by mouth 3 (three) times daily as needed. 10/07/21   [provider]  amitriptyline (ELAVIL) 25 MG tablet Take by mouth. 09/30/21   [provider]  cyclobenzaprine (FLEXERIL) 10 MG tablet TAKE 1 TABLET EVERY 8 HOURS AS NEEDED FOR MUSCLE SPASMS. 05/07/21   Florian Buff, MD  dicyclomine (BENTYL) 20 MG tablet Take 20 mg by mouth 4 (four) times daily. 11/20/20   [provider]  escitalopram (LEXAPRO) 10 MG tablet  09/17/20   [provider]  fluconazole (DIFLUCAN) 150 MG tablet Take 1 tablet (150 mg total) by mouth once a week. 01/19/22   Jaynee Eagles, PA-C  fluticasone (FLONASE) 50 MCG/ACT nasal spray Place 1 spray into both nostrils 2 (two) times daily. 02/26/22   Volney American, PA-C  hydrocortisone (ANUSOL-HC) 25 MG  suppository Place 1 suppository (25 mg total) rectally 2 (two) times daily. 12/03/20   Estill Dooms, NP  ibuprofen (ADVIL) 800 MG tablet Take 800 mg by mouth every 8 (eight) hours as needed.    [provider]  Levonorgestrel (KYLEENA IU) by Intrauterine route. Patient not taking: Reported on 10/21/2021    [provider]  lidocaine (XYLOCAINE) 5 % ointment Apply topically 4 (four) times daily as needed. 12/03/20   Estill Dooms, NP  metFORMIN (GLUCOPHAGE) 500 MG tablet Take 1 tablet (500 mg total) by mouth 2 (two) times daily with a meal. 04/17/21   Isla Pence, MD  metroNIDAZOLE (FLAGYL) 500 MG tablet Take 1 tablet (500 mg total) by mouth 2 (two) times daily with a meal. DO NOT CONSUME ALCOHOL WHILE TAKING THIS MEDICATION. 01/19/22   Jaynee Eagles, PA-C  naproxen (NAPROSYN) 500 MG tablet Take 1 tablet (500 mg total) by mouth 2 (two) times daily with a meal. 01/19/22   Jaynee Eagles, PA-C  nitrofurantoin, macrocrystal-monohydrate, (MACROBID) 100 MG capsule Take 1 capsule (100 mg total) by mouth 2 (two) times daily. 11/19/21   Lamptey, Myrene Galas, MD  oxybutynin (DITROPAN) 5 MG tablet Take 1 tablet (5 mg total) by mouth every 8 (eight) hours as needed for bladder spasms. 10/09/21   Eulogio Bear, NP  oxyCODONE-acetaminophen (PERCOCET/ROXICET) 5-325 MG tablet Take 1 tablet by mouth every 6 (six) hours as needed for severe pain. Patient not taking: Reported on 10/21/2021 10/03/21   Davonna Belling, MD  pramoxine-hydrocortisone (PROCTOCREAM-HC) 1-1 % rectal cream Place 1 application rectally 2 (two) times daily. 12/03/20   Estill Dooms, NP  promethazine (PHENERGAN) 25 MG suppository Place 1 suppository (25 mg total) rectally every 8 (eight) hours as needed for up to 12 doses for nausea or vomiting. Patient not taking: Reported on 10/21/2021 10/08/21   Wyvonnia Dusky, MD  promethazine (PHENERGAN) 25 MG tablet Take 1 tablet (25 mg total) by mouth every 6 (six) hours as  needed for nausea or vomiting. 09/29/21   Truddie Hidden, MD  promethazine-dextromethorphan (PROMETHAZINE-DM) 6.25-15 MG/5ML syrup Take 2.5 mLs by mouth 3 (three) times daily as needed for cough. 01/19/22   Jaynee Eagles, PA-C  promethazine-dextromethorphan (PROMETHAZINE-DM) 6.25-15 MG/5ML syrup Take 5 mLs by mouth 4 (four) times daily as needed. 02/26/22   Volney American, PA-C  pseudoephedrine (SUDAFED) 60 MG tablet Take 1 tablet (60 mg total) by mouth every 8 (eight) hours as needed for congestion. 01/19/22   Jaynee Eagles, PA-C  tamsulosin (FLOMAX) 0.4 MG CAPS capsule Take 1 capsule (0.4 mg total)  by mouth daily after supper. 10/21/21   McKenzie, Candee Furbish, MD  valACYclovir (VALTREX) 1000 MG tablet Take 1 tablet (1,000 mg total) by mouth daily. 08/15/20   Florian Buff, MD    Family History Family History  Problem Relation Age of Onset   Diabetes Maternal Uncle    Kidney disease Maternal Uncle        renal failure/dialysis   Heart attack Maternal Grandmother     Social History Social History   Tobacco Use   Smoking status: Never   Smokeless tobacco: Never  Vaping Use   Vaping Use: Never used  Substance Use Topics   Alcohol use: No   Drug use: No     Allergies   Ondansetron hcl, Fentanyl, and Penicillins   Review of Systems Review of Systems Per HPI  Physical Exam Triage Vital Signs ED Triage Vitals  Enc Vitals Group     BP 07/12/22 1536 (!) 146/76     Pulse --      Resp 07/12/22 1536 16     Temp 07/12/22 1536 97.9 F (36.6 C)     Temp Source 07/12/22 1536 Oral     SpO2 07/12/22 1536 96 %     Weight --      Height --      Head Circumference --      Peak Flow --      Pain Score 07/12/22 1542 9     Pain Loc --      Pain Edu? --      Excl. in [place]? --    No data found.  Updated Vital Signs BP (!) 146/76 (BP Location: Right Arm)   Temp 97.9 F (36.6 C) (Oral)   Resp 16   SpO2 96%   Visual Acuity Right Eye Distance:   Left Eye Distance:   Bilateral  Distance:    Right Eye Near:   Left Eye Near:    Bilateral Near:     Physical Exam Vitals and nursing note reviewed.  Constitutional:      Appearance: Normal appearance. She is not ill-appearing.  HENT:     Head: Atraumatic.     Right Ear: Tympanic membrane normal.     Left Ear: Tympanic membrane normal.     Mouth/Throat:     Mouth: Mucous membranes are moist.     Pharynx: Oropharynx is clear. No posterior oropharyngeal erythema.  Eyes:     Extraocular Movements: Extraocular movements intact.     Conjunctiva/sclera: Conjunctivae normal.  Cardiovascular:     Rate and Rhythm: Normal rate and regular rhythm.     Heart sounds: Normal heart sounds.  Pulmonary:     Effort: Pulmonary effort is normal.     Breath sounds: Normal breath sounds.  Musculoskeletal:        General: Normal range of motion.     Cervical back: Normal range of motion and neck supple.  Skin:    General: Skin is warm and dry.  Neurological:     Mental Status: She is alert and oriented to person, place, and time.  Psychiatric:        Mood and Affect: Mood normal.        Thought Content: Thought content normal.        Judgment: Judgment normal.      UC Treatments / Results  Labs (all labs ordered are listed, but only abnormal results are displayed) Labs Reviewed - No data to display  EKG   Radiology  No results found.  Procedures Procedures (including critical care time)  Medications Ordered in UC Medications  ketorolac (TORADOL) 30 MG/ML injection 30 mg (30 mg Intramuscular Given 07/12/22 1602)    Initial Impression / Assessment and Plan / UC Course  I have reviewed the triage vital signs and the nursing notes.  Pertinent labs & imaging results that were available during my care of the patient were reviewed by me and considered in my medical decision making (see chart for details).     Will cover with clindamycin in case dental infection as she is penicillin allergic.  Continue  over-the-counter Tylenol, requesting a Toradol shot today which was administered follow-up with dentist as scheduled.  Final Clinical Impressions(s) / UC Diagnoses   Final diagnoses:  Pain, dental   Discharge Instructions   None    ED Prescriptions     Medication Sig Dispense Auth. Provider   clindamycin (CLEOCIN) 300 MG capsule Take 1 capsule (300 mg total) by mouth 2 (two) times daily. 14 capsule Volney American, [place]      PDMP not reviewed this encounter.   Volney American, [place] 07/12/22 1616

## 2022-07-30 ENCOUNTER — Other Ambulatory Visit (HOSPITAL_BASED_OUTPATIENT_CLINIC_OR_DEPARTMENT_OTHER): Payer: Self-pay

## 2022-07-30 MED ORDER — ZEPBOUND 15 MG/0.5ML ~~LOC~~ SOAJ
15.0000 mg | SUBCUTANEOUS | 4 refills | Status: DC
  Filled 2022-07-30 – 2022-12-29 (×7): qty 2, 28d supply, fill #0
  Filled 2023-01-10: qty 6, 84d supply, fill #0

## 2022-07-31 ENCOUNTER — Other Ambulatory Visit (HOSPITAL_COMMUNITY): Payer: Self-pay

## 2022-08-02 ENCOUNTER — Other Ambulatory Visit (HOSPITAL_BASED_OUTPATIENT_CLINIC_OR_DEPARTMENT_OTHER): Payer: Self-pay

## 2022-08-03 ENCOUNTER — Other Ambulatory Visit (HOSPITAL_BASED_OUTPATIENT_CLINIC_OR_DEPARTMENT_OTHER): Payer: Self-pay

## 2022-08-10 ENCOUNTER — Other Ambulatory Visit (HOSPITAL_BASED_OUTPATIENT_CLINIC_OR_DEPARTMENT_OTHER): Payer: Self-pay

## 2022-08-16 ENCOUNTER — Other Ambulatory Visit (HOSPITAL_BASED_OUTPATIENT_CLINIC_OR_DEPARTMENT_OTHER): Payer: Self-pay

## 2022-08-20 ENCOUNTER — Other Ambulatory Visit (HOSPITAL_BASED_OUTPATIENT_CLINIC_OR_DEPARTMENT_OTHER): Payer: Self-pay

## 2022-08-20 ENCOUNTER — Other Ambulatory Visit: Payer: Self-pay

## 2022-08-23 ENCOUNTER — Other Ambulatory Visit (HOSPITAL_BASED_OUTPATIENT_CLINIC_OR_DEPARTMENT_OTHER): Payer: Self-pay

## 2022-08-23 MED ORDER — CEFUROXIME AXETIL 500 MG PO TABS
500.0000 mg | ORAL_TABLET | Freq: Two times a day (BID) | ORAL | 0 refills | Status: AC
  Filled 2022-08-23: qty 20, 10d supply, fill #0

## 2022-08-26 ENCOUNTER — Other Ambulatory Visit (HOSPITAL_BASED_OUTPATIENT_CLINIC_OR_DEPARTMENT_OTHER): Payer: Self-pay

## 2022-08-26 MED ORDER — MELOXICAM 15 MG PO TABS
15.0000 mg | ORAL_TABLET | Freq: Every day | ORAL | 2 refills | Status: DC
  Filled 2022-08-26: qty 30, 30d supply, fill #0

## 2022-09-03 ENCOUNTER — Other Ambulatory Visit (HOSPITAL_BASED_OUTPATIENT_CLINIC_OR_DEPARTMENT_OTHER): Payer: Self-pay

## 2022-09-23 ENCOUNTER — Other Ambulatory Visit (HOSPITAL_BASED_OUTPATIENT_CLINIC_OR_DEPARTMENT_OTHER): Payer: Self-pay

## 2022-09-23 MED ORDER — ALPRAZOLAM 0.5 MG PO TABS
ORAL_TABLET | ORAL | 0 refills | Status: AC
  Filled 2022-09-23 – 2022-09-27 (×2): qty 75, 30d supply, fill #0

## 2022-09-24 ENCOUNTER — Other Ambulatory Visit (HOSPITAL_BASED_OUTPATIENT_CLINIC_OR_DEPARTMENT_OTHER): Payer: Self-pay

## 2022-09-27 ENCOUNTER — Other Ambulatory Visit: Payer: Self-pay

## 2022-09-27 ENCOUNTER — Other Ambulatory Visit (HOSPITAL_BASED_OUTPATIENT_CLINIC_OR_DEPARTMENT_OTHER): Payer: Self-pay

## 2022-10-01 ENCOUNTER — Ambulatory Visit: Payer: Medicaid Other | Admitting: Obstetrics and Gynecology

## 2022-10-05 ENCOUNTER — Other Ambulatory Visit (HOSPITAL_BASED_OUTPATIENT_CLINIC_OR_DEPARTMENT_OTHER): Payer: Self-pay

## 2022-10-15 ENCOUNTER — Other Ambulatory Visit (HOSPITAL_BASED_OUTPATIENT_CLINIC_OR_DEPARTMENT_OTHER): Payer: Self-pay

## 2022-10-21 ENCOUNTER — Encounter (HOSPITAL_COMMUNITY): Payer: Self-pay | Admitting: Emergency Medicine

## 2022-10-21 ENCOUNTER — Other Ambulatory Visit: Payer: Self-pay

## 2022-10-21 ENCOUNTER — Emergency Department (HOSPITAL_COMMUNITY)
Admission: EM | Admit: 2022-10-21 | Discharge: 2022-10-21 | Disposition: A | Discharge status: Home / Self Care | Payer: Medicaid Other | Attending: Emergency Medicine | Admitting: Emergency Medicine

## 2022-10-21 DIAGNOSIS — K0889 Other specified disorders of teeth and supporting structures: Secondary | ICD-10-CM | POA: Insufficient documentation

## 2022-10-21 MED ORDER — PROMETHAZINE HCL 25 MG PO TABS: 25.0000 mg | ORAL_TABLET | Freq: Four times a day (QID) | ORAL | 0 refills | Status: DC | PRN

## 2022-10-21 MED ORDER — HYDROCODONE-ACETAMINOPHEN 5-325 MG PO TABS: 1.0000 | ORAL_TABLET | Freq: Four times a day (QID) | ORAL | 0 refills | Status: DC | PRN

## 2022-10-21 MED ORDER — PROMETHAZINE HCL 12.5 MG PO TABS
25.0000 mg | ORAL_TABLET | Freq: Once | ORAL | Status: AC
  Administered 2022-10-21: 25 mg via ORAL
  Filled 2022-10-21: qty 2

## 2022-10-21 MED ORDER — HYDROCODONE-ACETAMINOPHEN 5-325 MG PO TABS
1.0000 | ORAL_TABLET | Freq: Once | ORAL | Status: AC
  Administered 2022-10-21: 1 via ORAL
  Filled 2022-10-21: qty 1

## 2022-10-21 NOTE — ED Triage Notes (Signed)
Pt reports she had a root canal yesterday on the upper right gum. PT reports "excruciating pain that radiates to my right head."

## 2022-10-21 NOTE — Discharge Instructions (Addendum)
Take the hydrocodone as needed for severe pain.  If not taking the hydrocodone you can take extra strength Tylenol 500 mg 2 tablets every 8 hours.  Would highly recommend that she get seen back by the dentist that did the root canal.  Take the Phenergan as needed for nausea vomiting.

## 2022-10-21 NOTE — ED Provider Notes (Signed)
[place] EMERGENCY DEPARTMENT AT [facility] Provider Note   CSN: [id number] Arrival date & time: 10/21/22  0801     History  Chief Complaint  Patient presents with   Dental Pain    Janice Taylor is a 32 y.o. female.  Patient status post root canal yesterday on a right upper front tooth.  Patient complained of a lot of pain since then.  Patient is just taking extra strength Tylenol.  Patient not on antibiotics.  This [facility].  Past medical history significant for attention deficit disorder and has a history of idiopathic thrombocytopenic purpura diagnosed in 2013.  Patient does not use tobacco products.       Home Medications Prior to Admission medications   Medication Sig Start Date End Date Taking? Authorizing Provider  HYDROcodone-acetaminophen (NORCO/VICODIN) 5-325 MG tablet Take 1 tablet by mouth every 6 (six) hours as needed. 10/21/22  Yes Vanetta Mulders, MD  promethazine (PHENERGAN) 25 MG tablet Take 1 tablet (25 mg total) by mouth every 6 (six) hours as needed for nausea or vomiting. 10/21/22  Yes Vanetta Mulders, MD  albuterol (VENTOLIN HFA) 108 (90 Base) MCG/ACT inhaler Inhale 2 puffs into the lungs every 6 (six) hours as needed for wheezing or shortness of breath. 01/28/22   Leath-Warren, Sadie Haber, NP  ALPRAZolam Prudy Feeler) 0.5 MG tablet Take 1 tablet (0.5 mg total) by mouth in the morning, then 1 tablet (0.5 mg total) in the afternoon, and then 0.5 tablet (0.25 mg total) in the evening. 09/23/22     ALPRAZolam (XANAX) 1 MG tablet Take 1 mg by mouth 3 (three) times daily as needed. 11/21/20   [provider]  alprazolam Prudy Feeler) 2 MG tablet Take 2 mg by mouth 3 (three) times daily as needed. 10/07/21   [provider]  amitriptyline (ELAVIL) 25 MG tablet Take by mouth. 09/30/21   [provider]  amLODipine (NORVASC) 10 MG tablet Take 10 mg by mouth daily. 02/14/20   [provider]  cetirizine (ZYRTEC  ALLERGY) 10 MG tablet Take 1 tablet (10 mg total) by mouth daily. 01/19/22   Wallis Bamberg, PA-C  clindamycin (CLEOCIN) 300 MG capsule Take 1 capsule (300 mg total) by mouth 2 (two) times daily. 07/12/22   Particia Nearing, PA-C  cyclobenzaprine (FLEXERIL) 10 MG tablet TAKE 1 TABLET EVERY 8 HOURS AS NEEDED FOR MUSCLE SPASMS. 05/07/21   Lazaro Arms, MD  dicyclomine (BENTYL) 20 MG tablet Take 20 mg by mouth 4 (four) times daily. 11/20/20   [provider]  escitalopram (LEXAPRO) 10 MG tablet  09/17/20   [provider]  fluconazole (DIFLUCAN) 150 MG tablet Take 1 tablet (150 mg total) by mouth once a week. 01/19/22   Wallis Bamberg, PA-C  fluticasone (FLONASE) 50 MCG/ACT nasal spray Place 1 spray into both nostrils 2 (two) times daily. 02/26/22   Particia Nearing, PA-C  hydrochlorothiazide (HYDRODIURIL) 25 MG tablet Take 25 mg by mouth daily.    [provider]  hydrocortisone (ANUSOL-HC) 25 MG suppository Place 1 suppository (25 mg total) rectally 2 (two) times daily. 12/03/20   Adline Potter, NP  ibuprofen (ADVIL) 800 MG tablet Take 800 mg by mouth every 8 (eight) hours as needed.    [provider]  Levonorgestrel (KYLEENA IU) by Intrauterine route. Patient not taking: Reported on 10/21/2021    [provider]  lidocaine (XYLOCAINE) 5 % ointment Apply topically 4 (four) times daily as needed. 12/03/20  Cyril Mourning A, NP  metFORMIN (GLUCOPHAGE) 500 MG tablet Take 1 tablet (500 mg total) by mouth 2 (two) times daily with a meal. 04/17/21   Jacalyn Lefevre, MD  metroNIDAZOLE (FLAGYL) 500 MG tablet Take 1 tablet (500 mg total) by mouth 2 (two) times daily with a meal. DO NOT CONSUME ALCOHOL WHILE TAKING THIS MEDICATION. 01/19/22   Wallis Bamberg, PA-C  naproxen (NAPROSYN) 500 MG tablet Take 1 tablet (500 mg total) by mouth 2 (two) times daily with a meal. 01/19/22   Wallis Bamberg, PA-C  nitrofurantoin, macrocrystal-monohydrate, (MACROBID) 100 MG capsule  Take 1 capsule (100 mg total) by mouth 2 (two) times daily. 11/19/21   Lamptey, Britta Mccreedy, MD  oxybutynin (DITROPAN) 5 MG tablet Take 1 tablet (5 mg total) by mouth every 8 (eight) hours as needed for bladder spasms. 10/09/21   Valentino Nose, NP  oxyCODONE-acetaminophen (PERCOCET/ROXICET) 5-325 MG tablet Take 1 tablet by mouth every 6 (six) hours as needed for severe pain. Patient not taking: Reported on 10/21/2021 10/03/21   Benjiman Core, MD  potassium chloride SA (KLOR-CON M) 20 MEQ tablet Take 1 tablet (20 mEq total) by mouth 2 (two) times daily. 09/22/21   Elson Areas, PA-C  pramoxine-hydrocortisone (PROCTOCREAM-HC) 1-1 % rectal cream Place 1 application rectally 2 (two) times daily. 12/03/20   Adline Potter, NP  promethazine (PHENERGAN) 25 MG suppository Place 1 suppository (25 mg total) rectally every 8 (eight) hours as needed for up to 12 doses for nausea or vomiting. Patient not taking: Reported on 10/21/2021 10/08/21   Terald Sleeper, MD  promethazine (PHENERGAN) 25 MG tablet Take 1 tablet (25 mg total) by mouth every 6 (six) hours as needed for nausea or vomiting. 09/29/21   Pollyann Savoy, MD  promethazine-dextromethorphan (PROMETHAZINE-DM) 6.25-15 MG/5ML syrup Take 2.5 mLs by mouth 3 (three) times daily as needed for cough. 01/19/22   Wallis Bamberg, PA-C  promethazine-dextromethorphan (PROMETHAZINE-DM) 6.25-15 MG/5ML syrup Take 5 mLs by mouth 4 (four) times daily as needed. 02/26/22   Particia Nearing, PA-C  pseudoephedrine (SUDAFED) 60 MG tablet Take 1 tablet (60 mg total) by mouth every 8 (eight) hours as needed for congestion. 01/19/22   Wallis Bamberg, PA-C  tamsulosin (FLOMAX) 0.4 MG CAPS capsule Take 1 capsule (0.4 mg total) by mouth daily after supper. 10/21/21   McKenzie, Mardene Celeste, MD  tirzepatide (ZEPBOUND) 15 MG/0.5ML Pen Inject 15 mg into the skin every 7 (seven) days. 07/28/22   Elder Negus, NP  valACYclovir (VALTREX) 1000 MG tablet Take 1 tablet (1,000 mg  total) by mouth daily. 08/15/20   Lazaro Arms, MD  zolpidem (AMBIEN) 10 MG tablet Take 1 tablet (10 mg total) by mouth at bedtime. 07/21/20   Shawnie Dapper, PA-C      Allergies    Ondansetron hcl, Fentanyl, and Penicillins    Review of Systems   Review of Systems  Constitutional:  Negative for chills and fever.  HENT:  Positive for dental problem. Negative for ear pain and sore throat.   Eyes:  Negative for pain and visual disturbance.  Respiratory:  Negative for cough and shortness of breath.   Cardiovascular:  Negative for chest pain and palpitations.  Gastrointestinal:  Negative for abdominal pain and vomiting.  Genitourinary:  Negative for dysuria and hematuria.  Musculoskeletal:  Negative for arthralgias and back pain.  Skin:  Negative for color change and rash.  Neurological:  Negative for seizures and syncope.  All other systems reviewed and  are negative.   Physical Exam Updated Vital Signs BP (!) 153/82   Pulse (!) 113   Temp 98.5 F (36.9 C) (Oral)   Resp 19   SpO2 95%  Physical Exam Vitals and nursing note reviewed.  Constitutional:      General: She is not in acute distress.    Appearance: Normal appearance. She is well-developed.  HENT:     Head: Normocephalic and atraumatic.     Mouth/Throat:     Mouth: Mucous membranes are moist.     Pharynx: No oropharyngeal exudate or posterior oropharyngeal erythema.     Comments: Patient's upper tooth on the right side appears very normal no significant gum swelling. Eyes:     Conjunctiva/sclera: Conjunctivae normal.     Pupils: Pupils are equal, round, and reactive to light.  Cardiovascular:     Rate and Rhythm: Normal rate and regular rhythm.     Heart sounds: No murmur heard. Pulmonary:     Effort: Pulmonary effort is normal. No respiratory distress.     Breath sounds: Normal breath sounds.  Abdominal:     Palpations: Abdomen is soft.     Tenderness: There is no abdominal tenderness.  Musculoskeletal:         General: No swelling.     Cervical back: Neck supple.  Skin:    General: Skin is warm and dry.     Capillary Refill: Capillary refill takes less than 2 seconds.  Neurological:     General: No focal deficit present.     Mental Status: She is alert and oriented to person, place, and time.  Psychiatric:        Mood and Affect: Mood normal.     ED Results / Procedures / Treatments   Labs (all labs ordered are listed, but only abnormal results are displayed) Labs Reviewed - No data to display  EKG None  Radiology No results found.  Procedures Procedures    Medications Ordered in ED Medications  promethazine (PHENERGAN) tablet 25 mg (25 mg Oral Given 10/21/22 0902)  HYDROcodone-acetaminophen (NORCO/VICODIN) 5-325 MG per tablet 1 tablet (1 tablet Oral Given 10/21/22 5366)    ED Course/ Medical Decision Making/ A&P                             Medical Decision Making Risk Prescription drug management.   Patient does not want her to take Motrin that could be because of her history of ITP.  Will treat here with a hydrocodone and Phenergan and give her a prepack.  She will be to take extra strength Tylenol when not taking the hydrocodone.  Recommend that she follow back up with her dentist.  Final Clinical Impression(s) / ED Diagnoses Final diagnoses:  Pain, dental    Rx / DC Orders ED Discharge Orders          Ordered    HYDROcodone-acetaminophen (NORCO/VICODIN) 5-325 MG tablet  Every 6 hours PRN        10/21/22 1023    promethazine (PHENERGAN) 25 MG tablet  Every 6 hours PRN        10/21/22 1027              Vanetta Mulders, MD 10/21/22 1032

## 2022-10-22 MED FILL — Hydrocodone-Acetaminophen Tab 5-325 MG: ORAL | Qty: 6 | Status: AC

## 2022-11-09 ENCOUNTER — Other Ambulatory Visit: Payer: Self-pay | Admitting: Obstetrics & Gynecology

## 2022-12-25 IMAGING — CT CT ABD-PELV W/ CM
2 of 4 series · 16 of 46 positions shown, 18 images · IV contrast (Omnipaque or Isovue)
Comparison: 02/20/2017

CLINICAL DATA: Upper abdominal pain for 2 days with nausea and
vomiting

EXAM:
CT ABDOMEN AND PELVIS WITH CONTRAST
TECHNIQUE: Multidetector CT imaging of the abdomen and pelvis was performed
using the standard protocol following bolus administration of
intravenous contrast.
CONTRAST:  100mL OMNIPAQUE IOHEXOL 300 MG/ML  SOLN

[Series 2: axial st · axial · 0.87mm/px · z∈[-482,-62]mm · 13 of 94 slices shown, 15 images]
[im 5/94  soft-tissue]
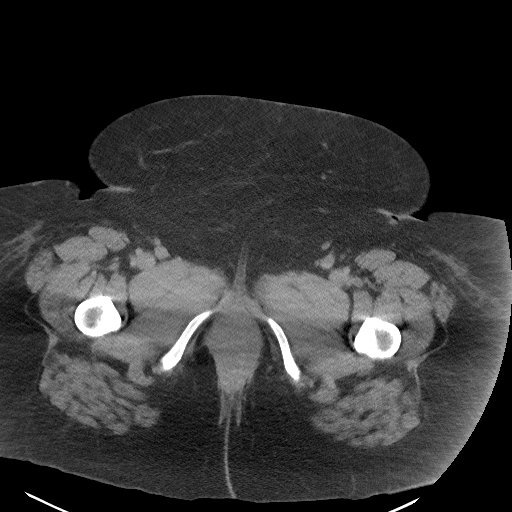
[im 5/94  bone]
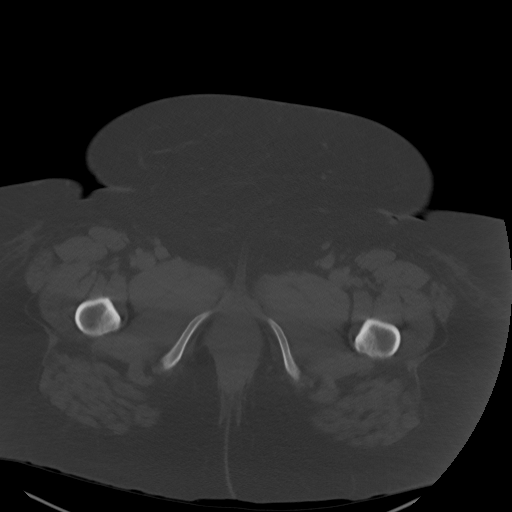
[im 14/94  soft-tissue]
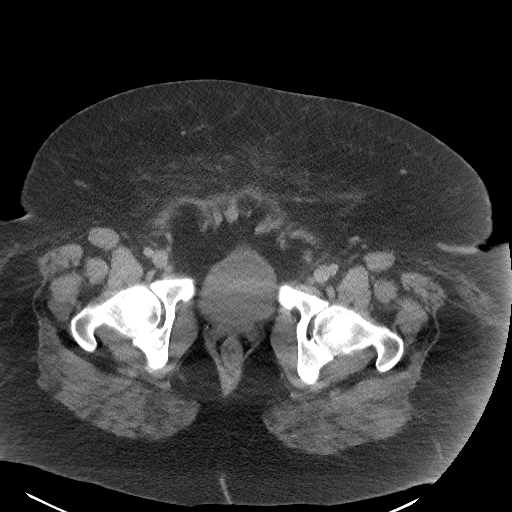
[im 18/94  soft-tissue]
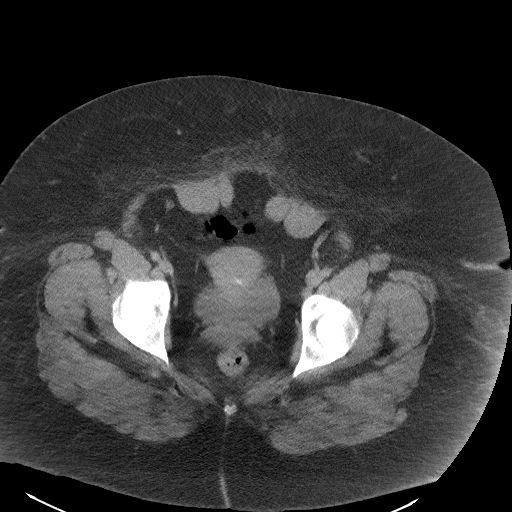
[im 27/94  soft-tissue]
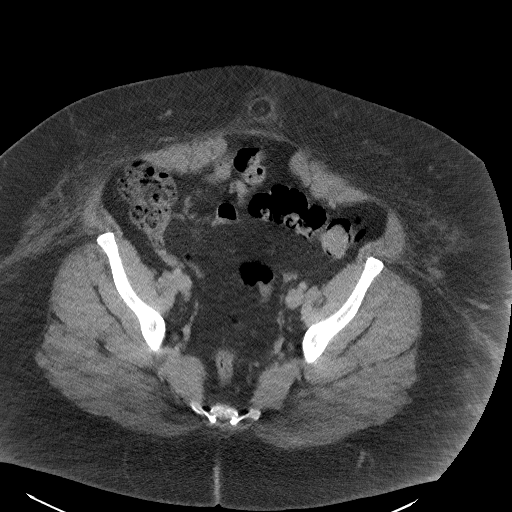
[im 32/94  soft-tissue]
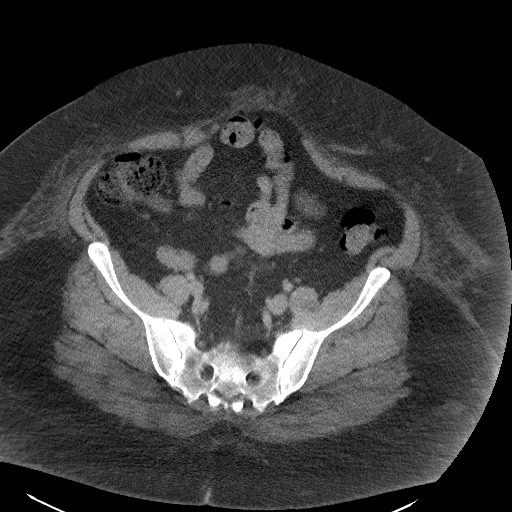
[im 40/94  soft-tissue]
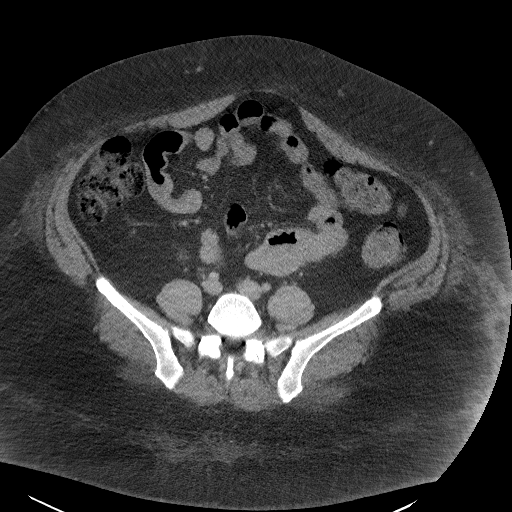
[im 49/94  soft-tissue]
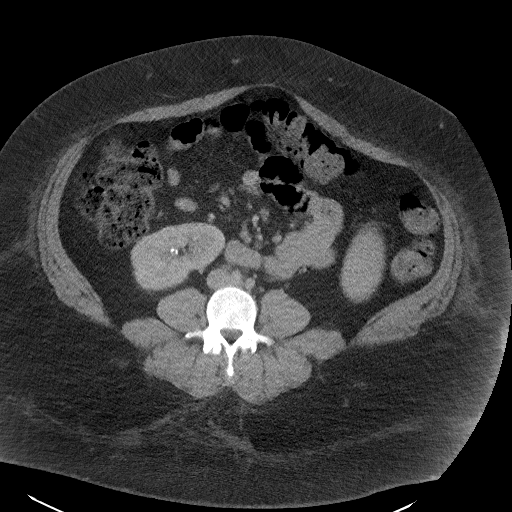
[im 54/94  soft-tissue]
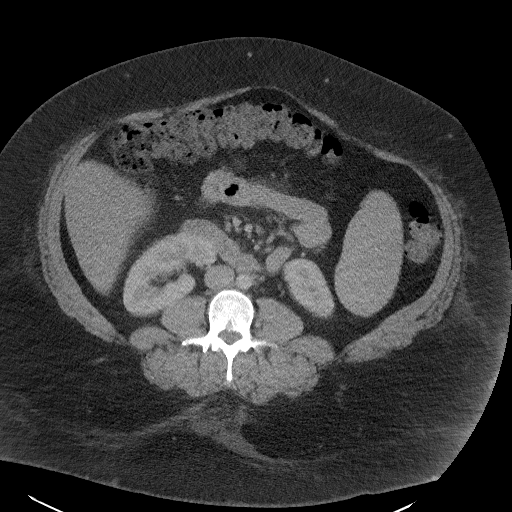
[im 63/94  soft-tissue]
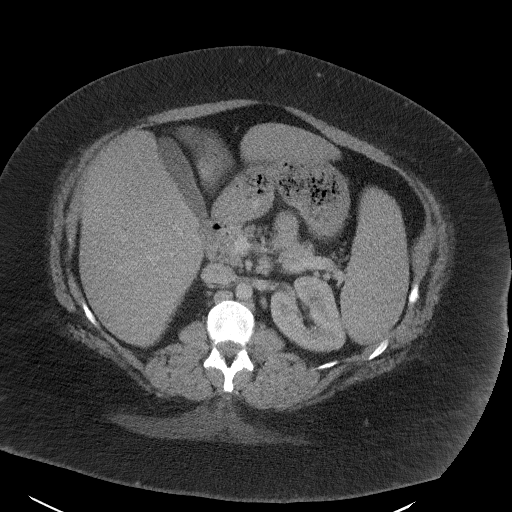
[im 63/94  bone]
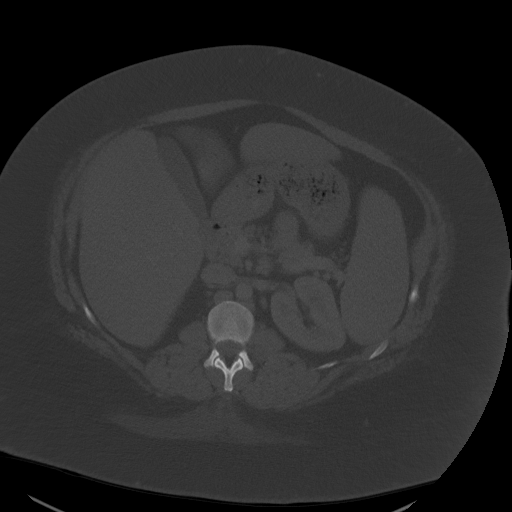
[im 67/94  soft-tissue]
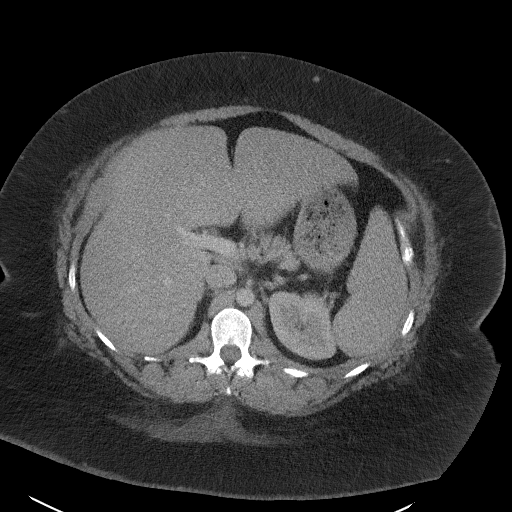
[im 76/94  soft-tissue]
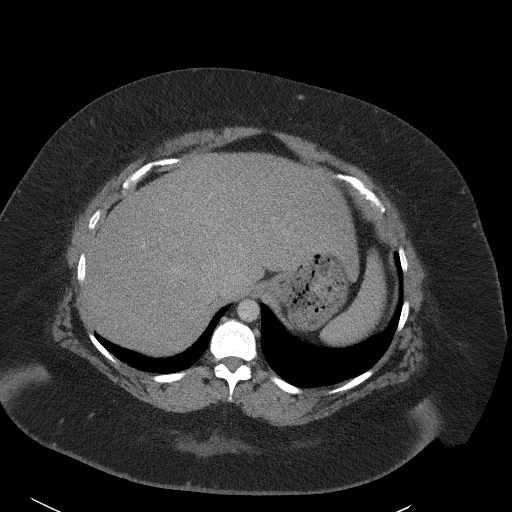
[im 80/94  soft-tissue]
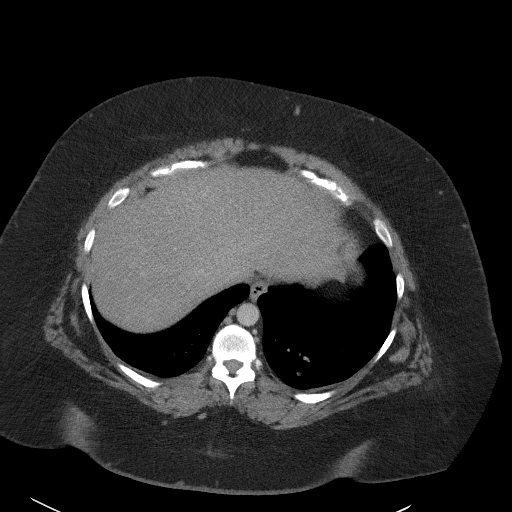
[im 89/94  soft-tissue]
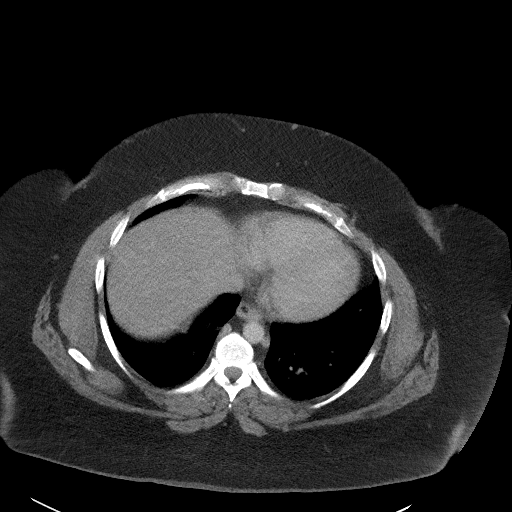

[Series 5: coronal st · coronal · 0.95mm/px · 3 of 131 slices shown]
[im 44/131  soft-tissue]
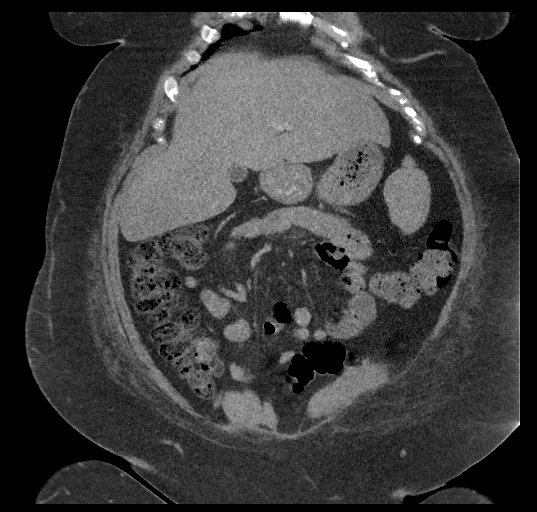
[im 58/131  soft-tissue]
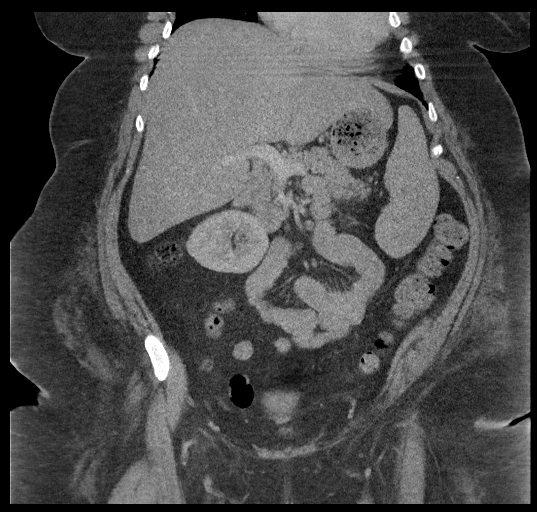
[im 73/131  soft-tissue]
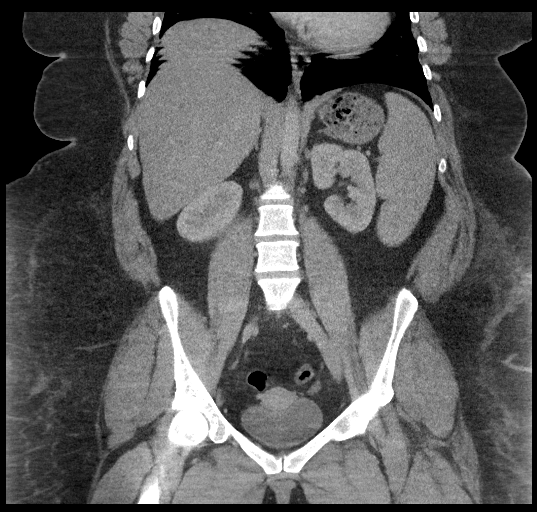

[16 of 46 positions shown; findings below may reference images not displayed]

FINDINGS: Lower chest: Lung bases are well aerated with mild patchy airspace
opacities consistent with multifocal infiltrate in the bases
bilaterally.

Hepatobiliary: Fatty infiltration of the liver is noted. The
gallbladder is within normal limits.

Pancreas: Unremarkable. No pancreatic ductal dilatation or
surrounding inflammatory changes.

Spleen: Normal in size without focal abnormality.

Adrenals/Urinary Tract: Adrenal glands are unremarkable. Kidneys are
well visualized bilaterally. Small nonobstructing renal calculi are
noted bilaterally. These are stable in appearance from the prior
exam. Ureters show no obstructive changes. There is a 2 mm
calcification which projects in the distal aspect of the left ureter
best seen on image number 80 of series 5. Again no obstructive
changes are seen. The right ureter is unremarkable. The bladder is
decompressed.

Stomach/Bowel: No obstructive or inflammatory changes of the colon
are seen. The appendix appears within normal limits. Small bowel and
stomach are unremarkable.

Vascular/Lymphatic: No significant vascular findings are present. No
enlarged abdominal or pelvic lymph nodes.

Reproductive: Uterus and bilateral adnexa are unremarkable. IUD is
noted in place.

Other: No abdominal wall hernia or abnormality. No abdominopelvic
ascites.

Musculoskeletal: No acute or significant osseous findings.
IMPRESSION: Nonobstructing renal calculi bilaterally.

There is a 2 mm calcification in the expected region of the distal
left ureter suggestive of distal left ureteral stone. No obstructive
changes are noted.

Patchy bibasilar infiltrates.

## 2022-12-26 IMAGING — US US ABDOMEN LIMITED
1 series · 14 of 25 positions shown · non-contrast
Comparison: CT scan 01/25/2021

CLINICAL DATA: Right upper quadrant abdominal pain for 2-3 days.
Nausea.

EXAM:
ULTRASOUND ABDOMEN LIMITED RIGHT UPPER QUADRANT

[Series 1: us abdomen limited ruq (liver/gb) · 14 of 94 slices shown]
[im 1/94]
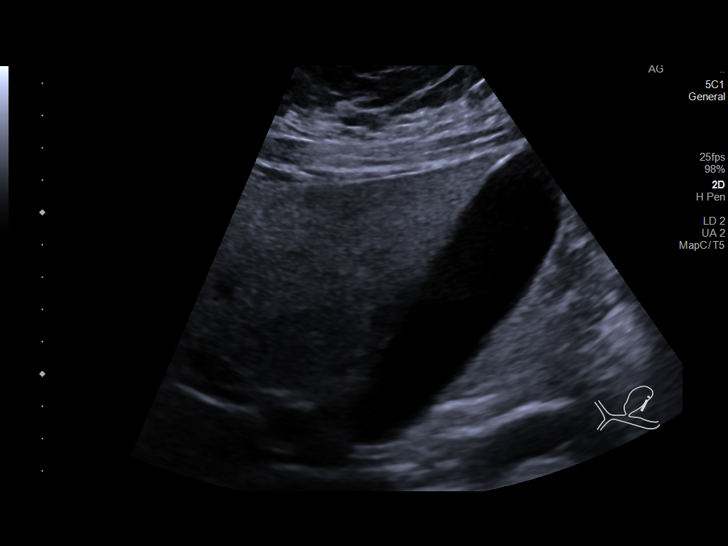
[im 8/94]
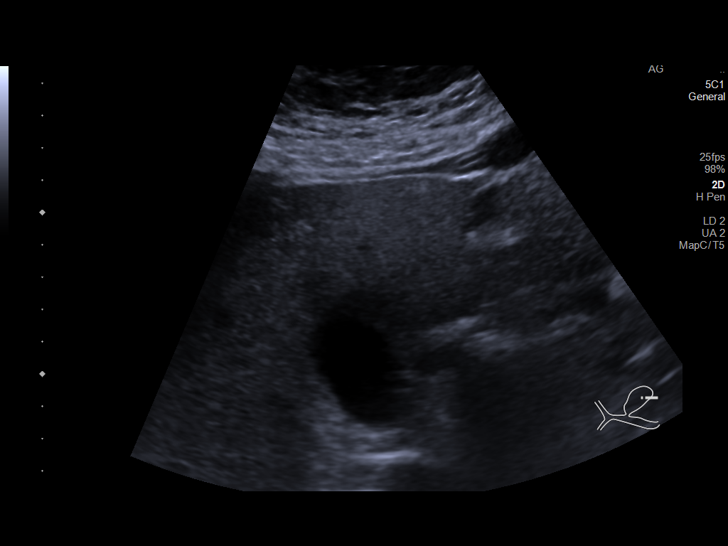
[im 16/94]
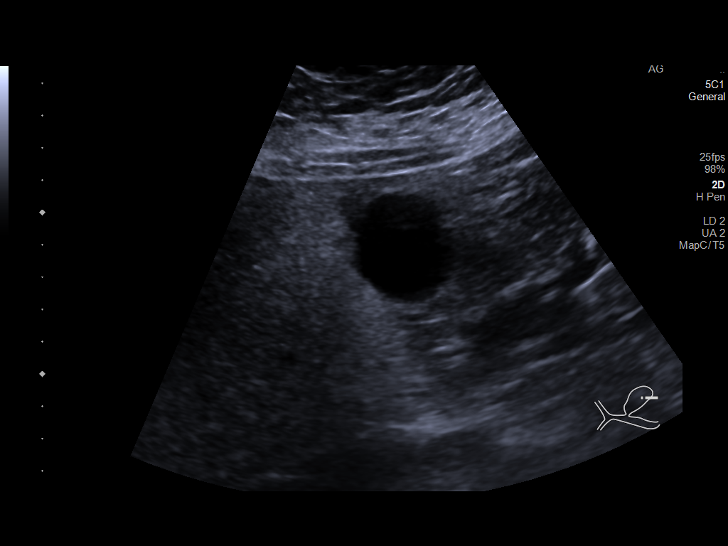
[im 24/94]
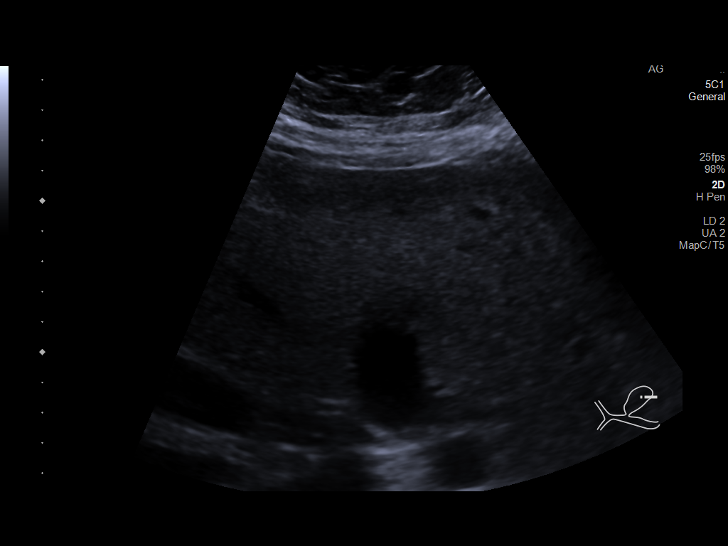
[im 32/94]
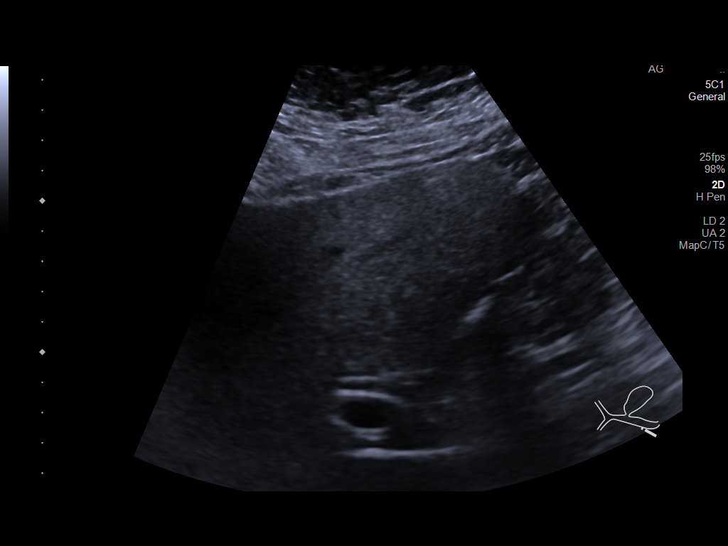
[im 35/94]
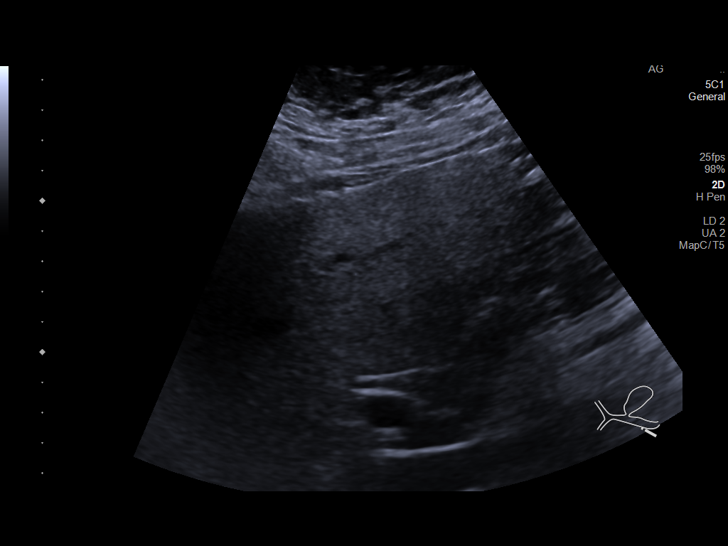
[im 43/94]
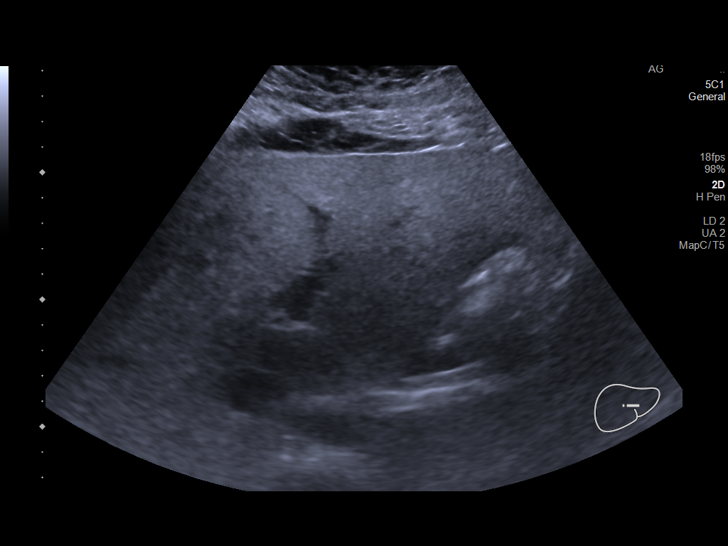
[im 51/94]
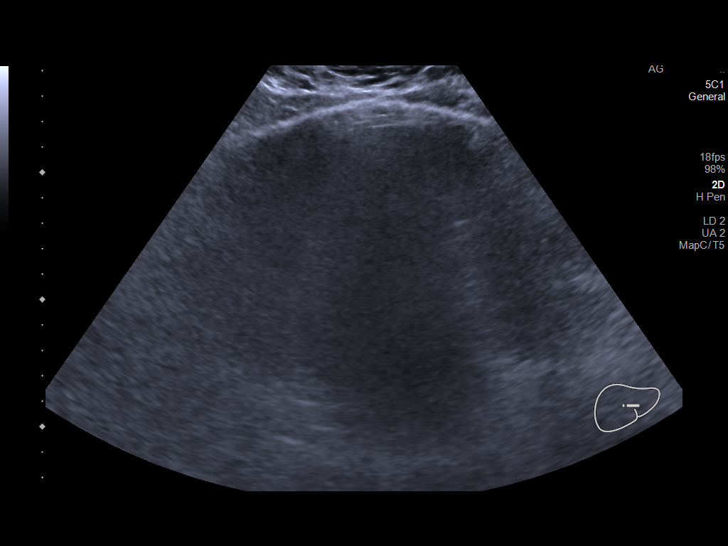
[im 59/94]
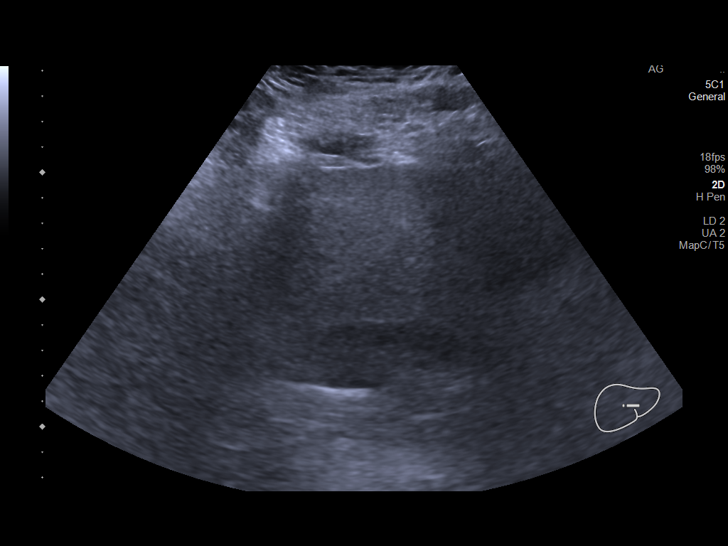
[im 63/94]
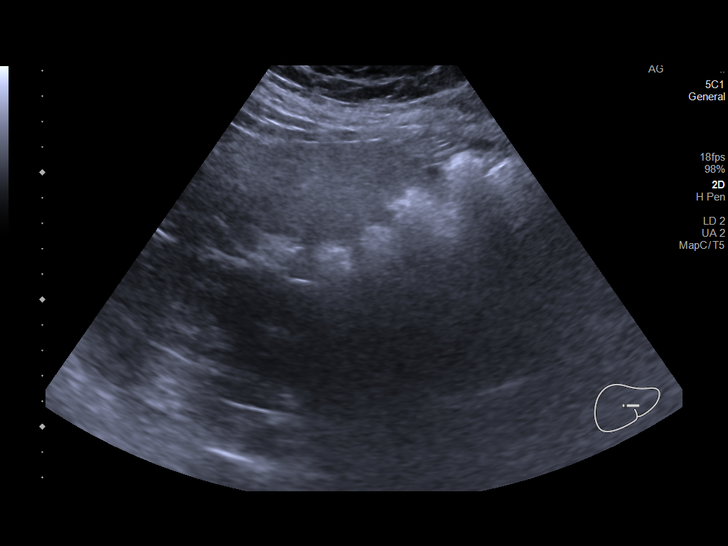
[im 70/94]
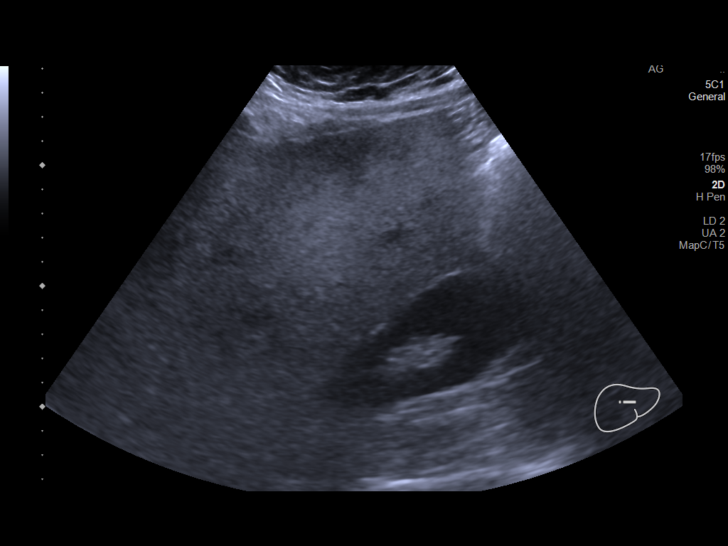
[im 78/94]
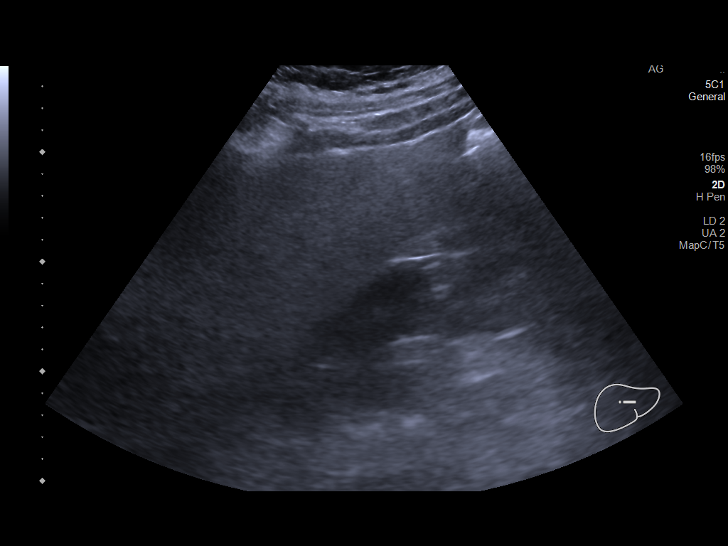
[im 86/94]
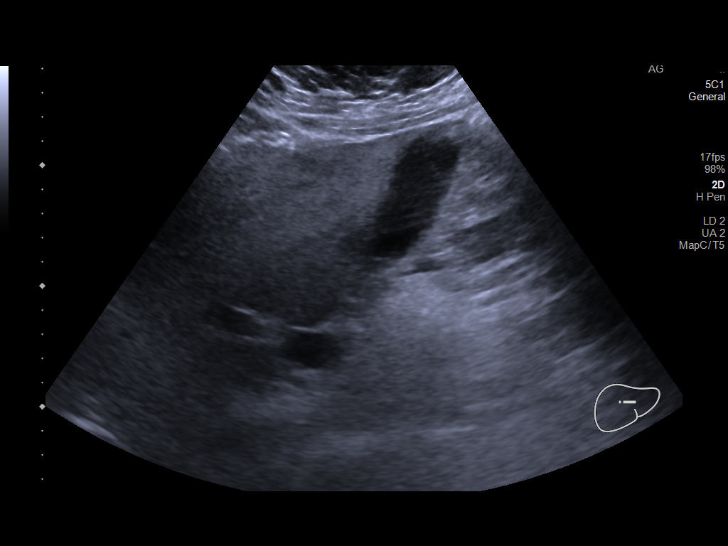
[im 94/94]
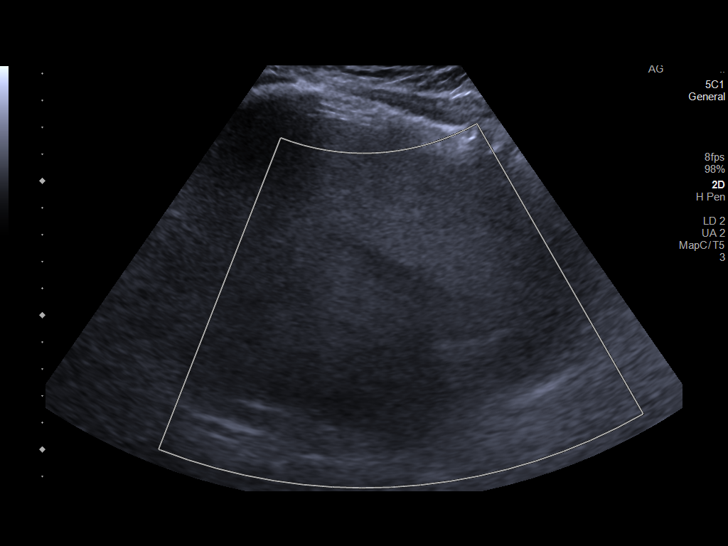

[14 of 25 positions shown; findings below may reference images not displayed]

FINDINGS: Gallbladder:

No gallstones or wall thickening visualized. No sonographic Murphy
sign noted by sonographer.

Common bile duct:

Diameter: 0.4 cm

Liver:

No focal lesion identified. Coarse echogenic liver with poor sonic
penetration compatible with diffuse hepatic steatosis. Portal vein
is patent on color Doppler imaging with normal direction of blood
flow towards the liver.

Other: None.
IMPRESSION: 1. Coarse echogenic liver with poor sonic penetration compatible
with diffuse hepatic steatosis.
2. Otherwise unremarkable.

## 2022-12-29 ENCOUNTER — Other Ambulatory Visit (HOSPITAL_BASED_OUTPATIENT_CLINIC_OR_DEPARTMENT_OTHER): Payer: Self-pay

## 2023-01-02 ENCOUNTER — Emergency Department (HOSPITAL_COMMUNITY): Payer: Medicaid Other

## 2023-01-02 ENCOUNTER — Emergency Department (HOSPITAL_COMMUNITY)
Admission: EM | Admit: 2023-01-02 | Discharge: 2023-01-02 | Disposition: A | Discharge status: Home / Self Care | Payer: Medicaid Other | Attending: Student | Admitting: Student

## 2023-01-02 ENCOUNTER — Other Ambulatory Visit: Payer: Self-pay

## 2023-01-02 DIAGNOSIS — N83201 Unspecified ovarian cyst, right side: Secondary | ICD-10-CM | POA: Insufficient documentation

## 2023-01-02 DIAGNOSIS — Z7984 Long term (current) use of oral hypoglycemic drugs: Secondary | ICD-10-CM | POA: Diagnosis not present

## 2023-01-02 DIAGNOSIS — N83209 Unspecified ovarian cyst, unspecified side: Secondary | ICD-10-CM

## 2023-01-02 DIAGNOSIS — Z79899 Other long term (current) drug therapy: Secondary | ICD-10-CM | POA: Insufficient documentation

## 2023-01-02 DIAGNOSIS — I10 Essential (primary) hypertension: Secondary | ICD-10-CM | POA: Diagnosis not present

## 2023-01-02 DIAGNOSIS — R109 Unspecified abdominal pain: Secondary | ICD-10-CM | POA: Diagnosis present

## 2023-01-02 DIAGNOSIS — E876 Hypokalemia: Secondary | ICD-10-CM | POA: Diagnosis not present

## 2023-01-02 LAB — CBC WITH DIFFERENTIAL/PLATELET
Abs Immature Granulocytes: 0.01 10*3/uL (ref 0.00–0.07)
Basophils Absolute: 0 10*3/uL (ref 0.0–0.1)
Basophils Relative: 0 %
Eosinophils Absolute: 0 10*3/uL (ref 0.0–0.5)
Eosinophils Relative: 1 %
HCT: 39.3 % (ref 36.0–46.0)
Hemoglobin: 12 g/dL (ref 12.0–15.0)
Immature Granulocytes: 0 %
Lymphocytes Relative: 25 %
Lymphs Abs: 2 10*3/uL (ref 0.7–4.0)
MCH: 23.6 pg — ABNORMAL LOW (ref 26.0–34.0)
MCHC: 30.5 g/dL (ref 30.0–36.0)
MCV: 77.4 fL — ABNORMAL LOW (ref 80.0–100.0)
Monocytes Absolute: 0.6 10*3/uL (ref 0.1–1.0)
Monocytes Relative: 7 %
Neutro Abs: 5.4 10*3/uL (ref 1.7–7.7)
Neutrophils Relative %: 67 %
Platelets: 249 10*3/uL (ref 150–400)
RBC: 5.08 MIL/uL (ref 3.87–5.11)
RDW: 16.5 % — ABNORMAL HIGH (ref 11.5–15.5)
WBC: 8 10*3/uL (ref 4.0–10.5)
nRBC: 0 % (ref 0.0–0.2)

## 2023-01-02 LAB — COMPREHENSIVE METABOLIC PANEL
ALT: 39 U/L (ref 0–44)
AST: 30 U/L (ref 15–41)
Albumin: 3.7 g/dL (ref 3.5–5.0)
Alkaline Phosphatase: 95 U/L (ref 38–126)
Anion gap: 10 (ref 5–15)
BUN: 10 mg/dL (ref 6–20)
CO2: 26 mmol/L (ref 22–32)
Calcium: 8.5 mg/dL — ABNORMAL LOW (ref 8.9–10.3)
Chloride: 98 mmol/L (ref 98–111)
Creatinine, Ser: 0.77 mg/dL (ref 0.44–1.00)
GFR, Estimated: >60 mL/min (ref >60)
Glucose, Bld: 92 mg/dL (ref 70–99)
Potassium: 3 mmol/L — ABNORMAL LOW (ref 3.5–5.1)
Sodium: 134 mmol/L — ABNORMAL LOW (ref 135–145)
Total Bilirubin: 0.6 mg/dL (ref 0.3–1.2)
Total Protein: 6.9 g/dL (ref 6.5–8.1)

## 2023-01-02 LAB — URINALYSIS, ROUTINE W REFLEX MICROSCOPIC
Bacteria, UA: NONE SEEN
Bilirubin Urine: NEGATIVE
Glucose, UA: >=500 mg/dL — AB
Hgb urine dipstick: NEGATIVE
Ketones, ur: NEGATIVE mg/dL
Leukocytes,Ua: NEGATIVE
Nitrite: NEGATIVE
Protein, ur: NEGATIVE mg/dL
Specific Gravity, Urine: 1.014 (ref 1.005–1.030)
pH: 6 (ref 5.0–8.0)

## 2023-01-02 LAB — PREGNANCY, URINE: Preg Test, Ur: NEGATIVE

## 2023-01-02 MED ORDER — MORPHINE SULFATE (PF) 4 MG/ML IV SOLN
4.0000 mg | Freq: Once | INTRAVENOUS | Status: AC
  Administered 2023-01-02: 4 mg via INTRAVENOUS
  Filled 2023-01-02: qty 1

## 2023-01-02 MED ORDER — KETOROLAC TROMETHAMINE 15 MG/ML IJ SOLN
15.0000 mg | Freq: Once | INTRAMUSCULAR | Status: AC
  Administered 2023-01-02: 15 mg via INTRAVENOUS
  Filled 2023-01-02: qty 1

## 2023-01-02 MED ORDER — PROCHLORPERAZINE EDISYLATE 10 MG/2ML IJ SOLN
10.0000 mg | Freq: Once | INTRAMUSCULAR | Status: AC
  Administered 2023-01-02: 10 mg via INTRAVENOUS
  Filled 2023-01-02: qty 2

## 2023-01-02 MED ORDER — GADOBUTROL 1 MMOL/ML IV SOLN
10.0000 mL | Freq: Once | INTRAVENOUS | Status: AC | PRN
  Administered 2023-01-02: 10 mL via INTRAVENOUS

## 2023-01-02 MED ORDER — ONDANSETRON HCL 4 MG/2ML IJ SOLN
4.0000 mg | Freq: Once | INTRAMUSCULAR | Status: DC
  Filled 2023-01-02: qty 2

## 2023-01-02 MED ORDER — MELOXICAM 15 MG PO TABS: 15.0000 mg | ORAL_TABLET | Freq: Every day | ORAL | 0 refills | Status: DC

## 2023-01-02 MED ORDER — LACTATED RINGERS IV BOLUS
1000.0000 mL | Freq: Once | INTRAVENOUS | Status: AC
  Administered 2023-01-02: 1000 mL via INTRAVENOUS

## 2023-01-02 MED ORDER — LIDOCAINE 5 % EX PTCH
1.0000 | MEDICATED_PATCH | CUTANEOUS | Status: DC
  Administered 2023-01-02: 1 via TRANSDERMAL
  Filled 2023-01-02: qty 1

## 2023-01-02 MED ORDER — HYDROCODONE-ACETAMINOPHEN 5-325 MG PO TABS: 1.0000 | ORAL_TABLET | Freq: Once | ORAL | Status: DC

## 2023-01-02 MED ORDER — METOCLOPRAMIDE HCL 5 MG/ML IJ SOLN
10.0000 mg | Freq: Once | INTRAMUSCULAR | Status: AC
  Administered 2023-01-02: 10 mg via INTRAVENOUS
  Filled 2023-01-02: qty 2

## 2023-01-02 MED ORDER — MORPHINE SULFATE (PF) 4 MG/ML IV SOLN
3.0000 mg | Freq: Once | INTRAVENOUS | Status: AC
  Administered 2023-01-02: 3 mg via INTRAVENOUS
  Filled 2023-01-02: qty 1

## 2023-01-02 MED ORDER — DIPHENHYDRAMINE HCL 50 MG/ML IJ SOLN
25.0000 mg | Freq: Once | INTRAMUSCULAR | Status: AC
  Administered 2023-01-02: 25 mg via INTRAVENOUS
  Filled 2023-01-02: qty 1

## 2023-01-02 MED ORDER — KETOROLAC TROMETHAMINE 30 MG/ML IJ SOLN
15.0000 mg | Freq: Once | INTRAMUSCULAR | Status: AC
  Administered 2023-01-02: 15 mg via INTRAVENOUS
  Filled 2023-01-02: qty 1

## 2023-01-02 NOTE — ED Provider Notes (Signed)
MRI reassuring, patient given all of these results, vital signs unremarkable, home with prescription anti-inflammatory   Eber Hong, MD 01/02/23 [phone]

## 2023-01-02 NOTE — ED Provider Notes (Signed)
EMERGENCY DEPARTMENT AT [facility] Provider Note  CSN: [id number] Arrival date & time: 01/02/23 0703  Chief Complaint(s) Flank Pain  HPI Janice Taylor is a 32 y.o. female with PMH ITP, endometriosis, ADHD, obesity, previous nephrolithiasis who presents emergency room for evaluation of right-sided flank pain.  Pain has been intermittent over the last 3 days but last night pain significantly worsened and has been constant.  She states this feels similar to her previous kidney stones.  Denies chest pain, shortness of breath, abdominal pain, headache, fever or other systemic symptoms.  Does endorse persistent nausea but no vomiting.   Past Medical History 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    Leg swelling    Obesity 01/05/2013   Thrombocytopenia (HCC) 11/25/2011   Consistent with ITP   Patient Active Problem List   Diagnosis Date Noted   Xanax use disorder, moderate, in controlled environment, dependence (HCC) 04/06/2022   Anterior anal fissure 12/09/2020   Positive fecal occult blood test 12/03/2020   Boil of groin 03/31/2020   Hemorrhoids 03/31/2020   Hypertension 03/31/2020   Encounter for IUD insertion 01/22/2019   Anxiety 11/07/2018   History of ITP 11/25/2011   Home Medication(s) Prior to Admission medications   Medication Sig Start Date End Date Taking? Authorizing Provider  meloxicam (MOBIC) 15 MG tablet Take 1 tablet (15 mg total) by mouth daily for 14 days. 01/02/23 01/16/23 Yes Eber Hong, MD  albuterol (VENTOLIN HFA) 108 (90 Base) MCG/ACT inhaler Inhale 2 puffs into the lungs every 6 (six) hours as needed for wheezing or shortness of breath. 01/28/22   Leath-Warren, Sadie Haber, NP   ALPRAZolam Prudy Feeler) 0.5 MG tablet Take 1 tablet (0.5 mg total) by mouth in the morning, then 1 tablet (0.5 mg total) in the afternoon, and then 0.5 tablet (0.25 mg total) in the evening. 09/23/22     ALPRAZolam (XANAX) 1 MG tablet Take 1 mg by mouth 3 (three) times daily as needed. 11/21/20   [provider]  alprazolam Prudy Feeler) 2 MG tablet Take 2 mg by mouth 3 (three) times daily as needed. 10/07/21   [provider]  amitriptyline (ELAVIL) 25 MG tablet Take by mouth. 09/30/21   [provider]  amLODipine (NORVASC) 10 MG tablet Take 10 mg by mouth daily. 02/14/20   [provider]  cetirizine (ZYRTEC ALLERGY) 10 MG tablet Take 1 tablet (10 mg total) by mouth daily. 01/19/22   Wallis Bamberg, PA-C  clindamycin (CLEOCIN) 300 MG capsule Take 1 capsule (300 mg total) by mouth 2 (two) times daily. 07/12/22   Particia Nearing, PA-C  cyclobenzaprine (FLEXERIL) 10 MG tablet TAKE 1 TABLET EVERY 8 HOURS AS NEEDED FOR MUSCLE SPASMS. 05/07/21   Lazaro Arms, MD  dicyclomine (BENTYL) 20 MG tablet Take 20 mg by mouth 4 (four) times daily. 11/20/20   [provider]  escitalopram (LEXAPRO) 10 MG tablet  09/17/20   [provider]  fluconazole (DIFLUCAN) 150 MG tablet Take 1 tablet (150 mg total) by mouth once a week. 01/19/22   Wallis Bamberg, PA-C  fluticasone (FLONASE) 50 MCG/ACT nasal spray Place 1 spray into both nostrils 2 (two)  times daily. 02/26/22   Particia Nearing, PA-C  hydrochlorothiazide (HYDRODIURIL) 25 MG tablet Take 25 mg by mouth daily.    [provider]  HYDROcodone-acetaminophen (NORCO/VICODIN) 5-325 MG tablet Take 1 tablet by mouth every 6 (six) hours as needed. 10/21/22   Vanetta Mulders, MD  hydrocortisone (ANUSOL-HC) 25 MG suppository Place 1 suppository (25 mg total) rectally 2 (two) times daily. 12/03/20   Adline Potter, NP  ibuprofen (ADVIL) 800 MG tablet Take 800 mg by mouth every 8 (eight) hours as needed.    [provider]  Levonorgestrel (KYLEENA IU) by Intrauterine route. Patient not taking: Reported on 10/21/2021    [provider]  lidocaine (XYLOCAINE) 5 % ointment Apply topically 4 (four) times daily as needed. 12/03/20   Adline Potter, NP  metFORMIN (GLUCOPHAGE) 500 MG tablet Take 1 tablet (500 mg total) by mouth 2 (two) times daily with a meal. 04/17/21   Jacalyn Lefevre, MD  metroNIDAZOLE (FLAGYL) 500 MG tablet Take 1 tablet (500 mg total) by mouth 2 (two) times daily with a meal. DO NOT CONSUME ALCOHOL WHILE TAKING THIS MEDICATION. 01/19/22   Wallis Bamberg, PA-C  naproxen (NAPROSYN) 500 MG tablet Take 1 tablet (500 mg total) by mouth 2 (two) times daily with a meal. 01/19/22   Wallis Bamberg, PA-C  nitrofurantoin, macrocrystal-monohydrate, (MACROBID) 100 MG capsule Take 1 capsule (100 mg total) by mouth 2 (two) times daily. 11/19/21   Merrilee Jansky, MD  nystatin (MYCOSTATIN/NYSTOP) powder apply 1 gram TO THE SKIN DAILY 11/12/22   Lazaro Arms, MD  oxybutynin (DITROPAN) 5 MG tablet Take 1 tablet (5 mg total) by mouth every 8 (eight) hours as needed for bladder spasms. 10/09/21   Valentino Nose, NP  oxyCODONE-acetaminophen (PERCOCET/ROXICET) 5-325 MG tablet Take 1 tablet by mouth every 6 (six) hours as needed for severe pain. Patient not taking: Reported on 10/21/2021 10/03/21   Benjiman Core, MD  potassium chloride SA (KLOR-CON M) 20 MEQ tablet Take 1 tablet (20 mEq total) by mouth 2 (two) times daily. 09/22/21   Elson Areas, PA-C  pramoxine-hydrocortisone (PROCTOCREAM-HC) 1-1 % rectal cream Place 1 application rectally 2 (two) times daily. 12/03/20   Adline Potter, NP  promethazine (PHENERGAN) 25 MG suppository Place 1 suppository (25 mg total) rectally every 8 (eight) hours as needed for up to 12 doses for nausea or vomiting. Patient not taking: Reported on 10/21/2021 10/08/21   Terald Sleeper, MD  promethazine (PHENERGAN) 25 MG tablet Take 1 tablet (25 mg total) by  mouth every 6 (six) hours as needed for nausea or vomiting. 09/29/21   Pollyann Savoy, MD  promethazine (PHENERGAN) 25 MG tablet Take 1 tablet (25 mg total) by mouth every 6 (six) hours as needed for nausea or vomiting. 10/21/22   Vanetta Mulders, MD  promethazine-dextromethorphan (PROMETHAZINE-DM) 6.25-15 MG/5ML syrup Take 2.5 mLs by mouth 3 (three) times daily as needed for cough. 01/19/22   Wallis Bamberg, PA-C  promethazine-dextromethorphan (PROMETHAZINE-DM) 6.25-15 MG/5ML syrup Take 5 mLs by mouth 4 (four) times daily as needed. 02/26/22   Particia Nearing, PA-C  pseudoephedrine (SUDAFED) 60 MG tablet Take 1 tablet (60 mg total) by mouth every 8 (eight) hours as needed for congestion. 01/19/22   Wallis Bamberg, PA-C  tamsulosin (FLOMAX) 0.4 MG CAPS capsule Take 1 capsule (0.4 mg total) by mouth daily after supper. 10/21/21   McKenzie, Mardene Celeste, MD  tirzepatide (ZEPBOUND) 15 MG/0.5ML Pen Inject 15 mg into the skin  every 7 (seven) days. 07/28/22   Elder Negus, NP  valACYclovir (VALTREX) 1000 MG tablet Take 1 tablet (1,000 mg total) by mouth daily. 08/15/20   Lazaro Arms, MD  zolpidem (AMBIEN) 10 MG tablet Take 1 tablet (10 mg total) by mouth at bedtime. 07/21/20   Shawnie Dapper, PA-C                                                                                                                                    Past Surgical History Past Surgical History:  Procedure Laterality Date   CESAREAN SECTION N/A 12/18/2018   Procedure: CESAREAN SECTION;  Surgeon: Adam Phenix, MD;  Location: [facility] LD [facility]S;  Service: Obstetrics;  Laterality: N/A;   TONSILECTOMY, ADENOIDECTOMY, BILATERAL MYRINGOTOMY AND TUBES     Family History Family History  Problem Relation Age of Onset   Diabetes Maternal Uncle    Kidney disease Maternal Uncle        renal failure/dialysis   Heart attack Maternal Grandmother     Social History Social History   Tobacco Use   Smoking status: Never   Smokeless tobacco:  Never  Vaping Use   Vaping status: Never Used  Substance Use Topics   Alcohol use: No   Drug use: No   Allergies Ondansetron hcl, Fentanyl, and Penicillins  Review of Systems Review of Systems  Genitourinary:  Positive for flank pain.    Physical Exam Vital Signs  I have reviewed the triage vital signs BP 125/67   Pulse 96   Temp 98.5 F (36.9 C) (Oral)   Resp 18   Ht 5\' 4"  (1.626 m)   Wt (!) 145.2 kg   LMP 12/02/2022 (Approximate)   SpO2 99%   BMI 54.93 kg/m   Physical Exam Vitals and nursing note reviewed.  Constitutional:      General: She is not in acute distress.    Appearance: She is well-developed.  HENT:     Head: Normocephalic and atraumatic.  Eyes:     Conjunctiva/sclera: Conjunctivae normal.  Cardiovascular:     Rate and Rhythm: Normal rate and regular rhythm.     Heart sounds: No murmur heard. Pulmonary:     Effort: Pulmonary effort is normal. No respiratory distress.     Breath sounds: Normal breath sounds.  Abdominal:     Palpations: Abdomen is soft.     Tenderness: There is no abdominal tenderness. There is right CVA tenderness.  Musculoskeletal:        General: No swelling.     Cervical back: Neck supple.  Skin:    General: Skin is warm and dry.     Capillary Refill: Capillary refill takes less than 2 seconds.  Neurological:     Mental Status: She is alert.  Psychiatric:        Mood and Affect: Mood normal.     ED Results and Treatments Labs (all labs  ordered are listed, but only abnormal results are displayed) Labs Reviewed  CBC WITH DIFFERENTIAL/PLATELET - Abnormal; Notable for the following components:      Result Value   MCV 77.4 (*)    MCH 23.6 (*)    RDW 16.5 (*)    All other components within normal limits  COMPREHENSIVE METABOLIC PANEL - Abnormal; Notable for the following components:   Sodium 134 (*)    Potassium 3.0 (*)    Calcium 8.5 (*)    All other components within normal limits  URINALYSIS, ROUTINE W REFLEX  MICROSCOPIC - Abnormal; Notable for the following components:   Glucose, UA >=500 (*)    All other components within normal limits  PREGNANCY, URINE                                                                                                                          Radiology MR PELVIS W WO CONTRAST  Result Date: 01/02/2023 CLINICAL DATA:  Pelvic pain. Right adnexal cystic lesion on recent ultrasound. EXAM: MRI PELVIS WITHOUT AND WITH CONTRAST TECHNIQUE: Multiplanar multisequence MR imaging of the pelvis was performed both before and after administration of intravenous contrast. CONTRAST:  10mL GADAVIST GADOBUTROL 1 MMOL/ML IV SOLN COMPARISON:  Ultrasound and CT on 01/02/2023 FINDINGS: Lower Urinary Tract: No urinary bladder or urethral abnormality identified. Bowel: Unremarkable pelvic bowel loops. Vascular/Lymphatic: Unremarkable. No pathologically enlarged pelvic lymph nodes identified. Reproductive: -- Uterus: Measures 9.9 x 3.3 by 6.0 cm (volume = 100 cm^3). No myometrial masses or focal endometrial lesions identified. Cervix and vagina are unremarkable. -- Right ovary: 5.7 x 5.4 cm cystic lesion is seen in the right adnexa which shows a few thin internal septations. No thickened or irregular septations, or mural soft tissue nodules identified. -- Left ovary: Appears normal. No ovarian or adnexal masses identified. Other: No peritoneal thickening or abnormal free fluid. Musculoskeletal:  Unremarkable. IMPRESSION: 5.7 cm cystic lesion in the right adnexa, which shows a few thin internal septations. No high-risk features identified. (O-RADS Category 3 - Low Risk 1-<10% ROM) If not surgically excised, consider follow-up by [place] or MRI in 6 months. Normal appearance of uterus and left ovary. Electronically Signed   By: Danae Orleans M.D.   On: 01/02/2023 15:57   US PELVIC COMPLETE W TRANSVAGINAL AND TORSION R/O  Result Date: 01/02/2023 CLINICAL DATA:  Pelvic pain. EXAM: TRANSABDOMINAL AND TRANSVAGINAL  ULTRASOUND OF PELVIS DOPPLER ULTRASOUND OF OVARIES TECHNIQUE: Both transabdominal and transvaginal ultrasound examinations of the pelvis were performed. Transabdominal technique was performed for global imaging of the pelvis including uterus, ovaries, adnexal regions, and pelvic cul-de-sac. It was necessary to proceed with endovaginal exam following the transabdominal exam to visualize the endometrium and ovaries. Color and duplex Doppler ultrasound was utilized to evaluate blood flow to the ovaries. COMPARISON:  CT scan of same day. FINDINGS: Uterus Measurements: 7.5 x 4.3 x 4.0 cm = volume: 68 mL. No fibroids or other mass visualized. Endometrium Thickness: 3 mm  which is within normal limits. No focal abnormality visualized. Right ovary Measurements: 5.3 x 6.5 x 5.1 cm = volume: 77 mL. Mildly complex cyst measuring 5.7 x 4.6 x 4.4 cm is noted with possible septation. Left ovary Not well visualized due to body habitus. Pulsed Doppler evaluation of right ovary demonstrates normal low-resistance arterial and venous waveforms. Other findings No abnormal free fluid. IMPRESSION: Left ovary is not well visualized due to body habitus. 5.7 x 4.6 x 4.4 cm mildly complex right ovarian cyst is noted with possible septation. MRI is recommended to rule out cystic ovarian neoplasm. There is no evidence of right ovarian torsion. Electronically Signed   By: Lupita Raider M.D.   On: 01/02/2023 11:57   CT Renal Stone Study  Result Date: 01/02/2023 CLINICAL DATA:  32 year old female with history of right lower back and flank pain intermittently for the past several days. EXAM: CT ABDOMEN AND PELVIS WITHOUT CONTRAST TECHNIQUE: Multidetector CT imaging of the abdomen and pelvis was performed following the standard protocol without IV contrast. RADIATION DOSE REDUCTION: This exam was performed according to the departmental dose-optimization program which includes automated exposure control, adjustment of the mA and/or kV according  to patient size and/or use of iterative reconstruction technique. COMPARISON:  CT of the abdomen and pelvis 10/03/2021. FINDINGS: Lower chest: Unremarkable. Hepatobiliary: No definite suspicious cystic or solid hepatic lesions are confidently identified on today's noncontrast CT examination. Unenhanced appearance of the gallbladder is unremarkable. Pancreas: No definite pancreatic mass or peripancreatic fluid collections or inflammatory changes are noted on today's noncontrast CT examination. Spleen: Unremarkable. Adrenals/Urinary Tract: Multiple nonobstructive calculi measuring 2-5 mm in the collecting systems of both kidneys. No additional calculi are noted along the course of either ureter or within the lumen of the urinary bladder. No hydroureteronephrosis. Unenhanced appearance of the kidneys, bilateral adrenal glands and urinary bladder is otherwise unremarkable. Stomach/Bowel: Unenhanced appearance of the stomach is unremarkable. No pathologic dilatation of small bowel or colon. Normal appendix. Vascular/Lymphatic: No atherosclerotic calcifications in the abdominal aorta or pelvic vasculature. No definite lymphadenopathy noted in the abdomen or pelvis. Reproductive: Uterus and left ovary are unremarkable in appearance. 6.0 x 4.6 x 5.6 cm soft tissue attenuation mass in the right adnexal region (axial image 68 of series 2 and coronal image 50 of series 5), concerning for ovarian neoplasm. Other: Tiny umbilical hernia containing a small amount of omental fat. No significant volume of ascites. No pneumoperitoneum. Musculoskeletal: There are no aggressive appearing lytic or blastic lesions noted in the visualized portions of the skeleton. IMPRESSION: 1. Bilateral nonobstructive nephrolithiasis measuring 2-5 mm in the collecting systems of both kidneys. No ureteral stones or findings of urinary tract obstruction are noted at this time. 2. 6.0 x 4.6 x 5.6 cm mass in the right adnexal region concerning for potential  ovarian neoplasm. Further evaluation with transvaginal pelvic ultrasound is recommended at this time to better evaluate this finding and to exclude malignancy and/or associated ovarian torsion. 3. Additional incidental findings, as above. Electronically Signed   By: Trudie Reed M.D.   On: 01/02/2023 09:01    Pertinent labs & imaging results that were available during my care of the patient were reviewed by me and considered in my medical decision making (see MDM for details).  Medications Ordered in ED Medications  lidocaine (LIDODERM) 5 % 1 patch (1 patch Transdermal Patch Applied 01/02/23 0912)  ketorolac (TORADOL) 30 MG/ML injection 15 mg (has no administration in time range)  metoCLOPramide (REGLAN) injection 10  mg (has no administration in time range)  lactated ringers bolus 1,000 mL (0 mLs Intravenous Stopped 01/02/23 1620)  ketorolac (TORADOL) 15 MG/ML injection 15 mg (15 mg Intravenous Given 01/02/23 0807)  prochlorperazine (COMPAZINE) injection 10 mg (10 mg Intravenous Given 01/02/23 0903)  diphenhydrAMINE (BENADRYL) injection 25 mg (25 mg Intravenous Given 01/02/23 0904)  morphine (PF) 4 MG/ML injection 3 mg (3 mg Intravenous Given 01/02/23 1226)  morphine (PF) 4 MG/ML injection 4 mg (4 mg Intravenous Given 01/02/23 1401)  gadobutrol (GADAVIST) 1 MMOL/ML injection 10 mL (10 mLs Intravenous Contrast Given 01/02/23 1503)                                                                                                                                     Procedures Procedures  (including critical care time)  Medical Decision Making / ED Course   This patient presents to the ED for concern of flank pain, this involves an extensive number of treatment options, and is a complaint that carries with it a high risk of complications and morbidity.  The differential diagnosis includes nephrolithiasis, pyelonephritis, ovarian cysts, appendicitis, musculoskeletal pain  MDM: Patient seen emergency room  for evaluation of right flank pain.  Physical exam with right CVA tenderness but is otherwise unremarkable.  Laboratory evaluation with a potassium of 3.0, no significant leukocytosis, urinalysis unremarkable.  Pregnancy negative.  CT stone study negative for nephrolithiasis but does show a 6.0 x 4.6 x 5.6 right adnexal mass.  Pelvic ultrasound showing a complex cystic mass and MRI recommended to rule out cystic neoplasm.  On reevaluation, patient continues to have pain MRI was ordered.  At time of signout, patient pending MRI pelvis.  Please see provider signout note for continuation of workup.   Additional history obtained: -Additional history obtained from daughter -External records from outside source obtained and reviewed including: Chart review including previous notes, labs, imaging, consultation notes   Lab Tests: -I ordered, reviewed, and interpreted labs.   The pertinent results include:   Labs Reviewed  CBC WITH DIFFERENTIAL/PLATELET - Abnormal; Notable for the following components:      Result Value   MCV 77.4 (*)    MCH 23.6 (*)    RDW 16.5 (*)    All other components within normal limits  COMPREHENSIVE METABOLIC PANEL - Abnormal; Notable for the following components:   Sodium 134 (*)    Potassium 3.0 (*)    Calcium 8.5 (*)    All other components within normal limits  URINALYSIS, ROUTINE W REFLEX MICROSCOPIC - Abnormal; Notable for the following components:   Glucose, UA >=500 (*)    All other components within normal limits  PREGNANCY, URINE       Imaging Studies ordered: I ordered imaging studies including TVUS, CT stone study I independently visualized and interpreted imaging. I agree with the radiologist interpretation  MRI pelvis pending  Medicines ordered and prescription drug management:  Meds ordered this encounter  Medications   lactated ringers bolus 1,000 mL   ketorolac (TORADOL) 15 MG/ML injection 15 mg   DISCONTD: ondansetron (ZOFRAN) injection 4  mg   prochlorperazine (COMPAZINE) injection 10 mg   diphenhydrAMINE (BENADRYL) injection 25 mg   lidocaine (LIDODERM) 5 % 1 patch   DISCONTD: HYDROcodone-acetaminophen (NORCO/VICODIN) 5-325 MG per tablet 1 tablet   morphine (PF) 4 MG/ML injection 3 mg   morphine (PF) 4 MG/ML injection 4 mg   gadobutrol (GADAVIST) 1 MMOL/ML injection 10 mL   meloxicam (MOBIC) 15 MG tablet    Sig: Take 1 tablet (15 mg total) by mouth daily for 14 days.    Dispense:  14 tablet    Refill:  0   ketorolac (TORADOL) 30 MG/ML injection 15 mg   metoCLOPramide (REGLAN) injection 10 mg    -I have reviewed the patients home medicines and have made adjustments as needed  Critical interventions none    Cardiac Monitoring: The patient was maintained on a cardiac monitor.  I personally viewed and interpreted the cardiac monitored which showed an underlying rhythm of: NSR  Social Determinants of Health:  Factors impacting patients care include: none   Reevaluation: After the interventions noted above, I reevaluated the patient and found that they have :improved  Co morbidities that complicate the patient evaluation  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    Leg swelling    Obesity 01/05/2013   Thrombocytopenia (HCC) 11/25/2011   Consistent with ITP      Dispostion: I considered admission for this patient, and disposition pending completion of imaging studies.  Please refer to signout note for continuation of workup.     Final Clinical Impression(s) / ED Diagnoses Final diagnoses:  Cyst of ovary, unspecified laterality     @PCDICTATION @    Glendora Score, MD 01/02/23 1701

## 2023-01-02 NOTE — ED Triage Notes (Signed)
Pt c/o right lower back flank pain intermittently for a couple  of days. Aleve has been tried and now she is feeling nauseous. Pt feels like she has to urinate and is unable to urinate as often or as much. Pt states she has only had small amounts of urine when she goes to the bathroom since yesterday.

## 2023-01-02 NOTE — Discharge Instructions (Signed)
Your MRI confirms that you have a cyst on your ovary, there is nothing else to suggest that this was cancerous or infectious.    Please take Mobic,  daily as needed for pain - this in an antiinflammatory medicine (NSAID) and is similar to ibuprofen - many people feel that it is stronger than ibuprofen and it is easier to take since it is a smaller pill.  Please use this only for 1 week - if your pain persists, you will need to follow up with your doctor in the office for ongoing guidance and pain control.  Thank you for allowing [place] to treat you in the emergency department today.  After reviewing your examination and potential testing that was done it appears that you are safe to go home.  I would like for you to follow-up with your doctor within the next several days, have them obtain your results and follow-up with them to review all of these tests.  If you should develop severe or worsening symptoms return to the emergency department immediately

## 2023-01-05 ENCOUNTER — Other Ambulatory Visit: Payer: Self-pay

## 2023-01-05 ENCOUNTER — Ambulatory Visit: Payer: Medicaid Other | Admitting: Obstetrics & Gynecology

## 2023-01-05 ENCOUNTER — Other Ambulatory Visit (HOSPITAL_COMMUNITY)
Admission: RE | Admit: 2023-01-05 | Discharge: 2023-01-05 | Disposition: A | Discharge status: Home / Self Care | Payer: Medicaid Other | Source: Ambulatory Visit | Attending: Obstetrics & Gynecology | Admitting: Obstetrics & Gynecology

## 2023-01-05 ENCOUNTER — Encounter (HOSPITAL_COMMUNITY): Payer: Self-pay | Admitting: Emergency Medicine

## 2023-01-05 ENCOUNTER — Emergency Department (HOSPITAL_COMMUNITY)
Admission: EM | Admit: 2023-01-05 | Discharge: 2023-01-05 | Disposition: A | Discharge status: Home / Self Care | Payer: Medicaid Other | Attending: Emergency Medicine | Admitting: Emergency Medicine

## 2023-01-05 ENCOUNTER — Encounter: Payer: Self-pay | Admitting: Obstetrics & Gynecology

## 2023-01-05 VITALS — BP 144/83 | HR 102 | Ht 64.0 in | Wt 338.0 lb

## 2023-01-05 DIAGNOSIS — I10 Essential (primary) hypertension: Secondary | ICD-10-CM | POA: Diagnosis not present

## 2023-01-05 DIAGNOSIS — N83201 Unspecified ovarian cyst, right side: Secondary | ICD-10-CM | POA: Insufficient documentation

## 2023-01-05 DIAGNOSIS — R102 Pelvic and perineal pain: Secondary | ICD-10-CM

## 2023-01-05 DIAGNOSIS — Z124 Encounter for screening for malignant neoplasm of cervix: Secondary | ICD-10-CM | POA: Diagnosis present

## 2023-01-05 DIAGNOSIS — N946 Dysmenorrhea, unspecified: Secondary | ICD-10-CM

## 2023-01-05 DIAGNOSIS — M549 Dorsalgia, unspecified: Secondary | ICD-10-CM | POA: Diagnosis present

## 2023-01-05 DIAGNOSIS — Z79899 Other long term (current) drug therapy: Secondary | ICD-10-CM | POA: Diagnosis not present

## 2023-01-05 DIAGNOSIS — Z7984 Long term (current) use of oral hypoglycemic drugs: Secondary | ICD-10-CM | POA: Insufficient documentation

## 2023-01-05 DIAGNOSIS — Z6841 Body Mass Index (BMI) 40.0 and over, adult: Secondary | ICD-10-CM

## 2023-01-05 DIAGNOSIS — N809 Endometriosis, unspecified: Secondary | ICD-10-CM

## 2023-01-05 DIAGNOSIS — R030 Elevated blood-pressure reading, without diagnosis of hypertension: Secondary | ICD-10-CM

## 2023-01-05 LAB — URINALYSIS, ROUTINE W REFLEX MICROSCOPIC
Bacteria, UA: NONE SEEN
Bilirubin Urine: NEGATIVE
Glucose, UA: >=500 mg/dL — AB
Ketones, ur: NEGATIVE mg/dL
Leukocytes,Ua: NEGATIVE
Nitrite: NEGATIVE
Protein, ur: NEGATIVE mg/dL
Specific Gravity, Urine: 1.003 — ABNORMAL LOW (ref 1.005–1.030)
pH: 6 (ref 5.0–8.0)

## 2023-01-05 MED ORDER — PROCHLORPERAZINE EDISYLATE 10 MG/2ML IJ SOLN
10.0000 mg | Freq: Once | INTRAMUSCULAR | Status: AC
  Administered 2023-01-05: 10 mg via INTRAVENOUS
  Filled 2023-01-05: qty 2

## 2023-01-05 MED ORDER — CYCLOBENZAPRINE HCL 10 MG PO TABS: 10.0000 mg | ORAL_TABLET | Freq: Three times a day (TID) | ORAL | 1 refills | Status: DC | PRN

## 2023-01-05 MED ORDER — MORPHINE SULFATE (PF) 4 MG/ML IV SOLN
4.0000 mg | Freq: Once | INTRAVENOUS | Status: AC
  Administered 2023-01-05: 4 mg via INTRAVENOUS
  Filled 2023-01-05: qty 1

## 2023-01-05 MED ORDER — NORETHINDRONE 0.35 MG PO TABS
1.0000 | ORAL_TABLET | Freq: Every day | ORAL | 4 refills | Status: DC
Start: 2023-01-05 — End: 2023-01-11

## 2023-01-05 MED ORDER — DIPHENHYDRAMINE HCL 50 MG/ML IJ SOLN
25.0000 mg | Freq: Once | INTRAMUSCULAR | Status: AC
  Administered 2023-01-05: 25 mg via INTRAVENOUS
  Filled 2023-01-05: qty 1

## 2023-01-05 MED ORDER — KETOROLAC TROMETHAMINE 10 MG PO TABS
10.0000 mg | ORAL_TABLET | Freq: Three times a day (TID) | ORAL | 0 refills | Status: AC
Start: 2023-01-05 — End: 2023-01-09

## 2023-01-05 MED ORDER — HYDROCODONE-ACETAMINOPHEN 5-325 MG PO TABS
2.0000 | ORAL_TABLET | ORAL | 0 refills | Status: DC | PRN
Start: 2023-01-05 — End: 2023-01-06

## 2023-01-05 MED ORDER — LACTATED RINGERS IV BOLUS
1000.0000 mL | Freq: Once | INTRAVENOUS | Status: AC
  Administered 2023-01-05: 1000 mL via INTRAVENOUS

## 2023-01-05 MED ORDER — CYCLOBENZAPRINE HCL 10 MG PO TABS
10.0000 mg | ORAL_TABLET | Freq: Three times a day (TID) | ORAL | 1 refills | Status: DC | PRN
Start: 2023-01-05 — End: 2023-08-11

## 2023-01-05 NOTE — ED Triage Notes (Signed)
Pt states she was here 2 days ago and dx with mass on her right ovary. Pt advised to follow up with OB/GYN, pt states she was given a muscle relaxer but that isn't enough.

## 2023-01-05 NOTE — Discharge Instructions (Signed)
You are seen today for ongoing pain in your ovary.  We gave you some pain medicine here and discharged you with a short course of pain medicine for home but ultimately need to follow-up with the OB/GYN doctor.  Continue taking meloxicam and using the heating pad.

## 2023-01-05 NOTE — ED Notes (Signed)
Pt given pre-pack of hydrocodone.

## 2023-01-05 NOTE — ED Provider Notes (Signed)
EMERGENCY DEPARTMENT AT [facility] Provider Note   CSN: [id number] Arrival date & time: 01/05/23  1919     History  Chief Complaint  Patient presents with   Back Pain    Janice Taylor is a 32 y.o. female.  She has PMH of anxiety hypertension.  Recently diagnosed with a large right ovarian cyst causing her to have back pain.  Had ultrasound and MRI in the ER on 9/8 and follow-up with gynecology outpatient today.  Has been taking meloxicam without relief.  States she has been taking it twice a day.  She denies nausea or vomiting.  Pain waxes and wanes in a 4 and 8 out of 10 on the pain scale.  She states she was really wanting to see if she could able to get admitted to have surgery today to just have it removed.  No fevers or chills, no urinary symptoms.  Back Pain      Home Medications Prior to Admission medications   Medication Sig Start Date End Date Taking? Authorizing Provider  albuterol (VENTOLIN HFA) 108 (90 Base) MCG/ACT inhaler Inhale 2 puffs into the lungs every 6 (six) hours as needed for wheezing or shortness of breath. 01/28/22   Leath-Warren, Sadie Haber, NP  ALPRAZolam Prudy Feeler) 0.5 MG tablet Take 1 tablet (0.5 mg total) by mouth in the morning, then 1 tablet (0.5 mg total) in the afternoon, and then 0.5 tablet (0.25 mg total) in the evening. 09/23/22     amitriptyline (ELAVIL) 25 MG tablet Take by mouth. 09/30/21   [provider]  amLODipine (NORVASC) 10 MG tablet Take 10 mg by mouth daily. 02/14/20   [provider]  busPIRone (BUSPAR) 15 MG tablet Take 15 mg by mouth 3 (three) times daily.    [provider]  cetirizine (ZYRTEC ALLERGY) 10 MG tablet Take 1 tablet (10 mg total) by mouth daily. 01/19/22   Wallis Bamberg, PA-C  cyclobenzaprine (FLEXERIL) 10 MG tablet Take 1 tablet (10 mg total) by mouth every 8 (eight) hours as needed for muscle spasms. 01/05/23   Myna Hidalgo, DO  DULoxetine (CYMBALTA) 60 MG capsule Take 60 mg by  mouth daily.    [provider]  empagliflozin (JARDIANCE) 25 MG TABS tablet Take by mouth daily.    [provider]  hydrochlorothiazide (HYDRODIURIL) 25 MG tablet Take 25 mg by mouth daily.    [provider]  ketorolac (TORADOL) 10 MG tablet Take 1 tablet (10 mg total) by mouth every 8 (eight) hours for 4 days. 01/05/23 01/09/23  Myna Hidalgo, DO  lamoTRIgine (LAMICTAL) 100 MG tablet Take 100 mg by mouth daily.    [provider]  metFORMIN (GLUCOPHAGE) 500 MG tablet Take by mouth 2 (two) times daily with a meal.    [provider]  norethindrone (MICRONOR) 0.35 MG tablet Take 1 tablet (0.35 mg total) by mouth daily. 01/05/23 04/05/23  Myna Hidalgo, DO  QUEtiapine (SEROQUEL) 50 MG tablet Take 50 mg by mouth at bedtime.    [provider]      Allergies    Ondansetron hcl, Fentanyl, and Penicillins    Review of Systems   Review of Systems  Musculoskeletal:  Positive for back pain.    Physical Exam Updated Vital Signs BP 139/86   Pulse 89   Temp 98.4 F (36.9 C) (Oral)   Resp 16   Ht 5\' 4"  (1.626 m)   Wt (!) 153.3 kg   LMP 12/02/2022 (Approximate)  SpO2 97%   BMI 58.02 kg/m  Physical Exam Vitals and nursing note reviewed.  Constitutional:      General: She is not in acute distress.    Appearance: She is well-developed.  HENT:     Head: Normocephalic and atraumatic.     Mouth/Throat:     Mouth: Mucous membranes are moist.  Eyes:     Extraocular Movements: Extraocular movements intact.     Conjunctiva/sclera: Conjunctivae normal.     Pupils: Pupils are equal, round, and reactive to light.  Cardiovascular:     Rate and Rhythm: Normal rate and regular rhythm.     Heart sounds: No murmur heard. Pulmonary:     Effort: Pulmonary effort is normal. No respiratory distress.     Breath sounds: Normal breath sounds.  Abdominal:     Palpations: Abdomen is soft.     Tenderness: There is no abdominal tenderness.   Musculoskeletal:        General: No swelling.     Cervical back: Neck supple.  Skin:    General: Skin is warm and dry.     Capillary Refill: Capillary refill takes less than 2 seconds.  Neurological:     General: No focal deficit present.     Mental Status: She is alert and oriented to person, place, and time.  Psychiatric:        Mood and Affect: Mood normal.     ED Results / Procedures / Treatments   Labs (all labs ordered are listed, but only abnormal results are displayed) Labs Reviewed  URINALYSIS, ROUTINE W REFLEX MICROSCOPIC - Abnormal; Notable for the following components:      Result Value   Color, Urine STRAW (*)    Specific Gravity, Urine 1.003 (*)    Glucose, UA >=500 (*)    Hgb urine dipstick SMALL (*)    All other components within normal limits    EKG None  Radiology No results found.  Procedures Procedures    Medications Ordered in ED Medications  morphine (PF) 4 MG/ML injection 4 mg (has no administration in time range)  morphine (PF) 4 MG/ML injection 4 mg (4 mg Intravenous Given 01/05/23 2214)  lactated ringers bolus 1,000 mL (1,000 mLs Intravenous New Bag/Given 01/05/23 2217)  prochlorperazine (COMPAZINE) injection 10 mg (10 mg Intravenous Given 01/05/23 2223)  diphenhydrAMINE (BENADRYL) injection 25 mg (25 mg Intravenous Given 01/05/23 2215)    ED Course/ Medical Decision Making/ A&P                                 Medical Decision Making This patient presents to the ED for concern of right low back pain related to ovarian cyst, this involves an extensive number of treatment options, and is a complaint that carries with it a high risk of complications and morbidity.  The differential diagnosis includes brain cyst, ovarian torsion, kidney stone, UTI, pyelonephritis, other   Co morbidities that complicate the patient evaluation :   Assess, hypertension   Additional history obtained:  Additional history obtained from EMR External records  from outside source obtained and reviewed including notes and imaging   Lab Tests:  I Ordered, and personally interpreted labs.  The pertinent results include: Urinalysis shows patient has glucose in urine, no UTI   Problem List / ED Course / Critical interventions / Medication management  Patient having continued right low back pain relieved mildly with heat, not improved  at all with meloxicam at home.  Seen by gynecology today who noted the plan was for conservative treatment initially.  As patient is having continued pain she is given morphine here, states initial dose only took the edge off, asking for second dose and to be discharged, states second dose last time really helped her.  Given a take-home pack but stated could not give her further narcotics and she needs a follow-up with gynecology.  I discussed with her that she is having no changes in her symptoms, do not feel is any need for admission or emergent surgery at this time.  I did consider possible torsion but she is able to ambulate, appears well and is not having changes in her symptoms, already had MRI and ultrasound.  I ordered medication including morphine  for pain  Reevaluation of the patient after these medicines showed that the patient improved I have reviewed the patients home medicines and have made adjustments as needed    Amount and/or Complexity of Data Reviewed Labs: ordered.  Risk Prescription drug management.           Final Clinical Impression(s) / ED Diagnoses Final diagnoses:  Cyst of right ovary    Rx / DC Orders ED Discharge Orders     None         Ma Rings, PA-C 01/05/23 2307    Tanda Rockers A, DO 01/09/23 2351

## 2023-01-05 NOTE — Progress Notes (Signed)
GYN VISIT Patient name: Janice Taylor MRN [id number]  Date of birth: 1990-10-21 Chief Complaint:   Follow-up  History of Present Illness:   Janice Taylor is a 32 y.o. [phone]  female being seen today for right ovarian cyst.  ER FOLLOW UP: Noted back pain- started 3 days ago.  Pain intermittent, ok with OTC medication.  Then Sat. Evening noted considerable pain with no improvement with medication and difficulty lying down.  In ER patient was given narcotic patch and Toradol, which finally did relieve her pain.  She continues to have significant right-sided back pain.  Some nausea no vomiting.  No fevers or chills.  No urinary concerns.  No irregular vaginal bleeding.  Recently seen in ER: MRI (01/02/2023): 9.8 x 3.3 x 6 cm uterus (100cc).  Right ovary with 5.7 x 5.4 cystic lesion with few thin internal septations.  Normal left ovary.  No free fluid noted.  Of note patient reports longstanding history of significant dysmenorrhea.  She has been diagnosed with endometriosis.  Per patient laparoscopy completed at [facility] in 2021  Care everywhere reviewed unable to find prior operative note/gyn visit  -Prior CD x 1  While she would be excited about another pregnancy, she understands that medically that may not be the best option for her.  Patient's last menstrual period was 12/02/2022 (approximate).    Review of Systems:   Pertinent items are noted in HPI Denies fever/chills, dizziness, headaches, visual disturbances, fatigue, shortness of breath, chest pain.   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 Physical Assessment:   Vitals:   01/05/23 0954 01/05/23 1036  BP: (!) 146/92 (!) 144/83  Pulse: (!) 102   Weight: (!) 338 lb (153.3 kg)   Height: 5\' 4"  (1.626 m)   Body mass index is 58.02 kg/m.       Physical Examination:   General appearance: alert, well appearing, and in no distress  Psych: mood appropriate, normal affect  Skin: warm & dry   Cardiovascular: normal heart rate noted  Respiratory: normal respiratory effort, no distress  Abdomen: soft, non-tender   Back: Right-sided tenderness noted with deep palpation  Pelvic: VULVA: normal appearing vulva with no masses, tenderness or lesions, VAGINA: normal appearing vagina with normal color and discharge, no lesions.  Difficulty visualizing cervix due to discomfort.  Pap and HPV testing collected  On bimanual-limited exam due to body habitus.  Unable to appreciate ovarian mass on right mild diffuse discomfort noted on exam  Extremities: no edema, no calf tenderness bilaterally  Chaperone: Faith Rogue    Assessment & Plan:  1) ovarian cyst, pain -Reviewed recent ultrasound report, reassured patient that findings showed  a benign cyst -Discussed conservative versus surgical intervention and ideally would recommend conservative therapy -Pain medication sent in, plan for follow-up imaging in 3 to 6 months -Should she note worsening of her symptoms return to clinic to discuss surgical intervention.     **Patient currently on Mounjaro, she is aware she would need to discontinue this medication at least 1 week prior to surgery  2) Endometriosis -Discussed long-term goals  including long-term management of dysmenorrhea and future ovarian cysts -Ideal plan COC's, however due to elevated BP on today's exam will start on Pap -Per patient she notes her elevated BPs due to her pain []  May also consider IUD in the future, since she previously had Kyleena -Plan to follow-up in a few months  3) Preventive screening -Pap collected, last completed in 2021  Meds ordered this encounter  Medications   DISCONTD: cyclobenzaprine (FLEXERIL) 10 MG tablet    Sig: Take 1 tablet (10 mg total) by mouth every 8 (eight) hours as needed for muscle spasms.    Dispense:  30 tablet    Refill:  1   cyclobenzaprine (FLEXERIL) 10 MG tablet    Sig: Take 1 tablet (10 mg total) by mouth every 8 (eight) hours as needed for muscle spasms.    Dispense:  30 tablet    Refill:  1   ketorolac (TORADOL) 10 MG tablet    Sig: Take 1 tablet (10 mg total) by mouth every 8 (eight) hours for 4 days.    Dispense:  12 tablet    Refill:  0   norethindrone (MICRONOR) 0.35 MG tablet    Sig: Take 1 tablet (0.35 mg total) by mouth daily.    Dispense:  90 tablet    Refill:  4     Return in about 6 months (around 07/05/2023) for Pelvic US with AMBER and visit after.   Myna Hidalgo, DO Attending Obstetrician & Gynecologist, [facility] for [employer], [facility]

## 2023-01-06 ENCOUNTER — Encounter: Payer: Self-pay | Admitting: Obstetrics & Gynecology

## 2023-01-06 ENCOUNTER — Other Ambulatory Visit: Payer: Self-pay | Admitting: Obstetrics & Gynecology

## 2023-01-06 DIAGNOSIS — N83201 Unspecified ovarian cyst, right side: Secondary | ICD-10-CM

## 2023-01-06 LAB — CYTOLOGY - PAP
Adequacy: ABSENT
Chlamydia: NEGATIVE
Comment: NEGATIVE
Comment: NEGATIVE
Comment: NORMAL
Diagnosis: NEGATIVE
High risk HPV: NEGATIVE
Neisseria Gonorrhea: NEGATIVE

## 2023-01-06 MED ORDER — OXYCODONE-ACETAMINOPHEN 5-325 MG PO TABS
1.0000 | ORAL_TABLET | ORAL | 0 refills | Status: AC | PRN
Start: 2023-01-06 — End: 2023-01-11

## 2023-01-06 MED FILL — Hydrocodone-Acetaminophen Tab 5-325 MG: ORAL | Qty: 6 | Status: AC

## 2023-01-06 NOTE — Progress Notes (Signed)
-  Called patient review.  She reports things are about the same and feels like the medication is only taking the edge off -Will send in Percocet for the acute timeframe and hopeful that things will start to improve for her -Plan to follow-up in 1 to 2 weeks if needed  Myna Hidalgo, DO Attending Obstetrician & Gynecologist, [facility] for [employer], [facility]

## 2023-01-09 ENCOUNTER — Encounter: Payer: Self-pay | Admitting: Obstetrics & Gynecology

## 2023-01-10 ENCOUNTER — Other Ambulatory Visit (HOSPITAL_BASED_OUTPATIENT_CLINIC_OR_DEPARTMENT_OTHER): Payer: Self-pay

## 2023-01-10 ENCOUNTER — Telehealth: Payer: Self-pay | Admitting: Obstetrics & Gynecology

## 2023-01-10 ENCOUNTER — Telehealth: Payer: Medicaid Other | Admitting: Nurse Practitioner

## 2023-01-10 ENCOUNTER — Other Ambulatory Visit: Payer: Self-pay | Admitting: Obstetrics & Gynecology

## 2023-01-10 DIAGNOSIS — N83209 Unspecified ovarian cyst, unspecified side: Secondary | ICD-10-CM

## 2023-01-10 DIAGNOSIS — N83201 Unspecified ovarian cyst, right side: Secondary | ICD-10-CM

## 2023-01-10 NOTE — Telephone Encounter (Signed)
Pt saw Dr. Charlotta Newton last week for ovarian cyst. She is still having a lot of pain and was told by Dr. Charlotta Newton that she would discuss case with you. Pt needs more pain medicine. Felt that oxycodone wasn't strong enough and has been taking every 3-4 hours.

## 2023-01-10 NOTE — Progress Notes (Signed)
Janice Taylor,  Thank you for submitting an e-visit. I see your recent visit to the ER for this concern. I apologize that this is not something we can manage over e-visits at this time.   I would suggest calling your OBGYN, and if you do not have one your primary care to discuss ongoing management of this concern.    I feel your condition warrants further evaluation and I recommend that you be seen for a face to face visit.  Please contact your primary care physician practice to be seen. Many offices offer virtual options to be seen via video if you are not comfortable going in person to a medical facility at this time.  NOTE: You will NOT be charged for this eVisit.  If you do not have a PCP, [place] offers a free physician referral service available at [phone]. Our trained staff has the experience, knowledge and resources to put you in touch with a physician who is right for you.    If you are having a true medical emergency please call 911.   Your e-visit answers were reviewed by a board certified advanced clinical practitioner to complete your personal care plan.  Thank you for using e-Visits.

## 2023-01-10 NOTE — Telephone Encounter (Signed)
Patient was calling to check to see what the next steps would be. I informed patient that you're on vacation and I would send message to Marchelle Folks since she is with Dr Despina Hidden today. She stated that she was told that the case would be discussed with Dr Despina Hidden. Please advise.

## 2023-01-11 MED ORDER — MEGESTROL ACETATE 40 MG PO TABS: 80.0000 mg | ORAL_TABLET | Freq: Every day | ORAL | 1 refills | Status: DC

## 2023-01-11 MED ORDER — KETOROLAC TROMETHAMINE 10 MG PO TABS: 10.0000 mg | ORAL_TABLET | Freq: Three times a day (TID) | ORAL | 0 refills | Status: DC | PRN

## 2023-01-13 MED ORDER — PROMETHAZINE HCL 25 MG PO TABS: 25.0000 mg | ORAL_TABLET | Freq: Four times a day (QID) | ORAL | 1 refills | Status: DC | PRN

## 2023-01-13 NOTE — Addendum Note (Signed)
Addended by: Lazaro Arms on: 01/13/2023 10:15 AM   Modules accepted: Orders

## 2023-01-14 MED ORDER — OXYCODONE-ACETAMINOPHEN 5-325 MG PO TABS: 1.0000 | ORAL_TABLET | Freq: Four times a day (QID) | ORAL | 0 refills | Status: DC | PRN

## 2023-01-14 MED ORDER — LEVOFLOXACIN 750 MG PO TABS: 750.0000 mg | ORAL_TABLET | Freq: Every day | ORAL | 0 refills | Status: DC

## 2023-01-14 NOTE — Addendum Note (Signed)
Addended by: Lazaro Arms on: 01/14/2023 11:19 AM   Modules accepted: Orders

## 2023-01-14 NOTE — Addendum Note (Signed)
Addended by: Lazaro Arms on: 01/14/2023 11:02 AM   Modules accepted: Orders

## 2023-02-02 ENCOUNTER — Encounter: Payer: Self-pay | Admitting: Obstetrics & Gynecology

## 2023-02-02 ENCOUNTER — Other Ambulatory Visit: Payer: Self-pay | Admitting: Obstetrics & Gynecology

## 2023-02-02 MED ORDER — METOCLOPRAMIDE HCL 5 MG PO TABS: 5.0000 mg | ORAL_TABLET | Freq: Three times a day (TID) | ORAL | 0 refills | Status: DC | PRN

## 2023-02-02 NOTE — Progress Notes (Signed)
Rx for reglan  Myna Hidalgo, DO Attending Obstetrician & Gynecologist, [facility] for [facility], [facility]

## 2023-02-03 ENCOUNTER — Other Ambulatory Visit: Payer: Self-pay | Admitting: Obstetrics & Gynecology

## 2023-02-03 MED ORDER — KETOROLAC TROMETHAMINE 10 MG PO TABS: 10.0000 mg | ORAL_TABLET | Freq: Three times a day (TID) | ORAL | 0 refills | Status: DC | PRN

## 2023-02-03 NOTE — Progress Notes (Signed)
Refill on toradol

## 2023-03-08 ENCOUNTER — Encounter: Payer: Self-pay | Admitting: Obstetrics & Gynecology

## 2023-03-08 MED ORDER — FLUCONAZOLE 150 MG PO TABS: ORAL_TABLET | ORAL | 1 refills | Status: DC

## 2023-03-12 ENCOUNTER — Emergency Department (HOSPITAL_COMMUNITY): Payer: Medicaid Other

## 2023-03-12 ENCOUNTER — Other Ambulatory Visit: Payer: Self-pay

## 2023-03-12 ENCOUNTER — Emergency Department (HOSPITAL_COMMUNITY)
Admission: EM | Admit: 2023-03-12 | Discharge: 2023-03-12 | Disposition: A | Discharge status: Home / Self Care | Payer: Medicaid Other | Attending: Emergency Medicine | Admitting: Emergency Medicine

## 2023-03-12 ENCOUNTER — Encounter (HOSPITAL_COMMUNITY): Payer: Self-pay

## 2023-03-12 DIAGNOSIS — R799 Abnormal finding of blood chemistry, unspecified: Secondary | ICD-10-CM | POA: Diagnosis not present

## 2023-03-12 DIAGNOSIS — R309 Painful micturition, unspecified: Secondary | ICD-10-CM | POA: Insufficient documentation

## 2023-03-12 DIAGNOSIS — R1031 Right lower quadrant pain: Secondary | ICD-10-CM | POA: Diagnosis present

## 2023-03-12 DIAGNOSIS — R110 Nausea: Secondary | ICD-10-CM | POA: Diagnosis not present

## 2023-03-12 LAB — CBC
HCT: 39.5 % (ref 36.0–46.0)
Hemoglobin: 12.2 g/dL (ref 12.0–15.0)
MCH: 24.1 pg — ABNORMAL LOW (ref 26.0–34.0)
MCHC: 30.9 g/dL (ref 30.0–36.0)
MCV: 78.1 fL — ABNORMAL LOW (ref 80.0–100.0)
Platelets: 271 10*3/uL (ref 150–400)
RBC: 5.06 MIL/uL (ref 3.87–5.11)
RDW: 15.9 % — ABNORMAL HIGH (ref 11.5–15.5)
WBC: 7.7 10*3/uL (ref 4.0–10.5)
nRBC: 0 % (ref 0.0–0.2)

## 2023-03-12 LAB — COMPREHENSIVE METABOLIC PANEL
ALT: 22 U/L (ref 0–44)
AST: 19 U/L (ref 15–41)
Albumin: 3.8 g/dL (ref 3.5–5.0)
Alkaline Phosphatase: 88 U/L (ref 38–126)
Anion gap: 10 (ref 5–15)
BUN: 12 mg/dL (ref 6–20)
CO2: 27 mmol/L (ref 22–32)
Calcium: 8.9 mg/dL (ref 8.9–10.3)
Chloride: 100 mmol/L (ref 98–111)
Creatinine, Ser: 0.97 mg/dL (ref 0.44–1.00)
GFR, Estimated: >60 mL/min (ref >60)
Glucose, Bld: 99 mg/dL (ref 70–99)
Potassium: 3.5 mmol/L (ref 3.5–5.1)
Sodium: 137 mmol/L (ref 135–145)
Total Bilirubin: 0.5 mg/dL (ref <1.2)
Total Protein: 6.6 g/dL (ref 6.5–8.1)

## 2023-03-12 LAB — URINALYSIS, ROUTINE W REFLEX MICROSCOPIC
Bacteria, UA: NONE SEEN
Bilirubin Urine: NEGATIVE
Glucose, UA: >=500 mg/dL — AB
Ketones, ur: NEGATIVE mg/dL
Nitrite: NEGATIVE
Protein, ur: NEGATIVE mg/dL
Specific Gravity, Urine: 1.015 (ref 1.005–1.030)
pH: 6 (ref 5.0–8.0)

## 2023-03-12 LAB — LIPASE, BLOOD: Lipase: 27 U/L (ref 11–51)

## 2023-03-12 LAB — PREGNANCY, URINE: Preg Test, Ur: NEGATIVE

## 2023-03-12 LAB — CBG MONITORING, ED: Glucose-Capillary: 107 mg/dL — ABNORMAL HIGH (ref 70–99)

## 2023-03-12 MED ORDER — METOCLOPRAMIDE HCL 10 MG PO TABS
5.0000 mg | ORAL_TABLET | Freq: Once | ORAL | Status: AC
  Administered 2023-03-12: 5 mg via ORAL
  Filled 2023-03-12: qty 1

## 2023-03-12 MED ORDER — HYDROMORPHONE HCL 1 MG/ML IJ SOLN
0.5000 mg | Freq: Once | INTRAMUSCULAR | Status: AC
  Administered 2023-03-12: 0.5 mg via INTRAVENOUS
  Filled 2023-03-12: qty 0.5

## 2023-03-12 MED ORDER — METOCLOPRAMIDE HCL 5 MG/ML IJ SOLN
5.0000 mg | Freq: Once | INTRAMUSCULAR | Status: AC
  Administered 2023-03-12: 5 mg via INTRAVENOUS
  Filled 2023-03-12: qty 2

## 2023-03-12 MED ORDER — HYDROCODONE-ACETAMINOPHEN 5-325 MG PO TABS
1.0000 | ORAL_TABLET | Freq: Once | ORAL | Status: AC
  Administered 2023-03-12: 1 via ORAL
  Filled 2023-03-12: qty 1

## 2023-03-12 MED ORDER — DICYCLOMINE HCL 20 MG PO TABS: 20.0000 mg | ORAL_TABLET | Freq: Two times a day (BID) | ORAL | 0 refills | Status: DC

## 2023-03-12 MED ORDER — PROMETHAZINE HCL 12.5 MG PO TABS: 12.5000 mg | ORAL_TABLET | Freq: Four times a day (QID) | ORAL | 0 refills | Status: DC | PRN

## 2023-03-12 MED ORDER — IOHEXOL 300 MG/ML  SOLN
100.0000 mL | Freq: Once | INTRAMUSCULAR | Status: AC | PRN
  Administered 2023-03-12: 100 mL via INTRAVENOUS

## 2023-03-12 MED ORDER — ONDANSETRON HCL 4 MG/2ML IJ SOLN
4.0000 mg | Freq: Once | INTRAMUSCULAR | Status: DC
  Filled 2023-03-12: qty 2

## 2023-03-12 NOTE — ED Triage Notes (Signed)
Pt reports she has been having abdominal pain due to an ovarian cyst and endometriosis so she went to UC.  Pt reports she had a negative vaginal swab at UC but when the provider heard  that her glucose had been running 200 to 250 she told the patient to come the ER because she could be in DKA.  Pt has hx of diabetes type II.

## 2023-03-12 NOTE — ED Notes (Signed)
CBG obtained during triage, pt type 2 diabetic.

## 2023-03-12 NOTE — Discharge Instructions (Addendum)
Take 120 mg tablet of Bentyl 2 times a day for abdominal pain . 1 tablet of 12.5 mg of Phenergan for nausea every 6 hours as needed.  Use OTC Tylenol or ibuprofen for further pain management.  Return to the ER if experiencing worsening abdominal pain, shortness of breath, chest pain, confusion, weakness.

## 2023-03-12 NOTE — ED Provider Notes (Signed)
[place] EMERGENCY DEPARTMENT AT [facility] Provider Note   CSN: [id number] Arrival date & time: 03/12/23  1743     History Chief Complaint  Patient presents with   Abdominal Pain    Janice Taylor is a 32 y.o. female.  Patient presents today with a 2-day history of right lower abdominal pain.  She has had multiple ED trips within the last 6 months.  she states that she has endometriosis and a ovarian cyst.  She went to urgent care this morning where she was evaluated recommended to come to the ER due to pain and further evaluation. Patient is currently on Mounjaro for the last year and endorses having gallbladder and appendix.  States no possibility of pregnancy, LMP being 3 weeks ago.  Endorses nausea, painful urination.  Denies diarrhea, constipation, vomiting, vaginal bleeding, hematuria.   Abdominal Pain      Home Medications Prior to Admission medications   Medication Sig Start Date End Date Taking? Authorizing Provider  dicyclomine (BENTYL) 20 MG tablet Take 1 tablet (20 mg total) by mouth 2 (two) times daily. 03/12/23  Yes Lunette Stands, PA-C  promethazine (PHENERGAN) 12.5 MG tablet Take 1 tablet (12.5 mg total) by mouth every 6 (six) hours as needed for nausea or vomiting. 03/12/23  Yes Lunette Stands, PA-C  albuterol (VENTOLIN HFA) 108 (90 Base) MCG/ACT inhaler Inhale 2 puffs into the lungs every 6 (six) hours as needed for wheezing or shortness of breath. 01/28/22   Leath-Warren, Sadie Haber, NP  ALPRAZolam Prudy Feeler) 0.5 MG tablet Take 1 tablet (0.5 mg total) by mouth in the morning, then 1 tablet (0.5 mg total) in the afternoon, and then 0.5 tablet (0.25 mg total) in the evening. 09/23/22     amitriptyline (ELAVIL) 25 MG tablet Take by mouth. 09/30/21   [provider]  amLODipine (NORVASC) 10 MG tablet Take 10 mg by mouth daily. 02/14/20   [provider]  busPIRone (BUSPAR) 15 MG tablet Take 15 mg by mouth 3 (three) times daily.    [provider]  cetirizine (ZYRTEC ALLERGY) 10 MG tablet Take 1 tablet (10 mg total) by mouth daily. 01/19/22   Wallis Bamberg, PA-C  cyclobenzaprine (FLEXERIL) 10 MG tablet Take 1 tablet (10 mg total) by mouth every 8 (eight) hours as needed for muscle spasms. 01/05/23   Myna Hidalgo, DO  DULoxetine (CYMBALTA) 60 MG capsule Take 60 mg by mouth daily.    [provider]  empagliflozin (JARDIANCE) 25 MG TABS tablet Take by mouth daily.    [provider]  fluconazole (DIFLUCAN) 150 MG tablet Take 1 tablet today and repeat in 3 days 03/08/23   Lazaro Arms, MD  hydrochlorothiazide (HYDRODIURIL) 25 MG tablet Take 25 mg by mouth daily.    [provider]  ketorolac (TORADOL) 10 MG tablet Take 1 tablet (10 mg total) by mouth every 8 (eight) hours as needed. 02/03/23   Myna Hidalgo, DO  lamoTRIgine (LAMICTAL) 100 MG tablet Take 100 mg by mouth daily.    [provider]  levofloxacin (LEVAQUIN) 750 MG tablet Take 1 tablet (750 mg total) by mouth daily. 01/14/23   Lazaro Arms, MD  megestrol (MEGACE) 40 MG tablet Take 2 tablets (80 mg total) by mouth daily. 01/11/23   Lazaro Arms, MD  metFORMIN (GLUCOPHAGE) 500 MG tablet Take by mouth 2 (two) times daily with a meal.    [provider]  metoCLOPramide (REGLAN) 5 MG tablet Take  1 tablet (5 mg total) by mouth every 8 (eight) hours as needed for up to 5 days for nausea. 02/02/23 02/07/23  Myna Hidalgo, DO  oxyCODONE-acetaminophen (PERCOCET) 5-325 MG tablet Take 1 tablet by mouth every 6 (six) hours as needed for severe pain. 01/14/23   Lazaro Arms, MD  QUEtiapine (SEROQUEL) 50 MG tablet Take 50 mg by mouth at bedtime.    [provider]      Allergies    Ondansetron hcl and Penicillins    Review of Systems   Review of Systems  Gastrointestinal:  Positive for abdominal pain.    Physical Exam Updated Vital Signs BP (!) 155/84   Pulse (!) 104   Temp 97.9 F (36.6 C) (Oral)   Resp 20   Ht  5\' 4"  (1.626 m)   Wt (!) 149.2 kg   LMP 03/12/2023   SpO2 96%   BMI 56.47 kg/m  Physical Exam Vitals and nursing note reviewed.  Constitutional:      Appearance: Normal appearance.  HENT:     Head: Normocephalic and atraumatic.  Eyes:     Extraocular Movements: Extraocular movements intact.     Conjunctiva/sclera: Conjunctivae normal.  Cardiovascular:     Rate and Rhythm: Normal rate and regular rhythm.     Pulses: Normal pulses.     Heart sounds: Normal heart sounds. No murmur heard.    No friction rub. No gallop.  Pulmonary:     Effort: Pulmonary effort is normal. No respiratory distress.     Breath sounds: Normal breath sounds.  Abdominal:     General: Abdomen is flat. Bowel sounds are increased.     Palpations: Abdomen is soft.     Tenderness: There is abdominal tenderness in the right lower quadrant.  Skin:    General: Skin is warm and dry.  Neurological:     General: No focal deficit present.     Mental Status: She is alert. Mental status is at baseline.  Psychiatric:        Mood and Affect: Mood normal.     ED Results / Procedures / Treatments   Labs (all labs ordered are listed, but only abnormal results are displayed) Labs Reviewed  CBC - Abnormal; Notable for the following components:      Result Value   MCV 78.1 (*)    MCH 24.1 (*)    RDW 15.9 (*)    All other components within normal limits  URINALYSIS, ROUTINE W REFLEX MICROSCOPIC - Abnormal; Notable for the following components:   Glucose, UA >=500 (*)    Hgb urine dipstick SMALL (*)    Leukocytes,Ua MODERATE (*)    All other components within normal limits  CBG MONITORING, ED - Abnormal; Notable for the following components:   Glucose-Capillary 107 (*)    All other components within normal limits  URINE CULTURE  LIPASE, BLOOD  COMPREHENSIVE METABOLIC PANEL  PREGNANCY, URINE    EKG None  Radiology CT ABDOMEN PELVIS W CONTRAST  Result Date: 03/12/2023 CLINICAL DATA:  Right lower  quadrant abdominal pain. EXAM: CT ABDOMEN AND PELVIS WITH CONTRAST TECHNIQUE: Multidetector CT imaging of the abdomen and pelvis was performed using the standard protocol following bolus administration of intravenous contrast. RADIATION DOSE REDUCTION: This exam was performed according to the departmental dose-optimization program which includes automated exposure control, adjustment of the mA and/or kV according to patient size and/or use of iterative reconstruction technique. CONTRAST:  OMNIPAQUE IOHEXOL 300 MG/ML  SOLN COMPARISON:  January 02, 2023 FINDINGS: Lower chest: No acute abnormality. Hepatobiliary: No focal liver abnormality is seen. No gallstones, gallbladder wall thickening, or biliary dilatation. Pancreas: Unremarkable. No pancreatic ductal dilatation or surrounding inflammatory changes. Spleen: Normal in size without focal abnormality. Adrenals/Urinary Tract: Normal adrenal glands. No evidence of hydronephrosis. Bilateral nephrolithiasis. Three right renal calculi measuring less than 4 mm each. Four renal calculi on the left measure less than 4 mm each. Stomach/Bowel: Stomach is within normal limits. Appendix appears normal. No evidence of bowel wall thickening, distention, or inflammatory changes. Vascular/Lymphatic: No significant vascular findings are present. No enlarged abdominal or pelvic lymph nodes. Reproductive: Uterus and left adnexa are unremarkable. The previously noted large right adnexal cyst has decreased in size to 2.8 x 2.4 cm. It still appears multiloculated. Other: Small periumbilical fat containing anterior abdominal wall hernia. No abdominopelvic ascites. Musculoskeletal: No acute or significant osseous findings. IMPRESSION: 1. Bilateral nonobstructing nephrolithiasis. 2. Interval decrease in size of the previously demonstrated right adnexal cyst. 3. Small periumbilical fat containing anterior abdominal wall hernia. Electronically Signed   By: Ted Mcalpine M.D.    On: 03/12/2023 19:53    Procedures Procedures    Medications Ordered in ED Medications  HYDROmorphone (DILAUDID) injection 0.5 mg (0.5 mg Intravenous Given 03/12/23 1900)  iohexol (OMNIPAQUE) 300 MG/ML solution 100 mL (100 mLs Intravenous Contrast Given 03/12/23 1916)  metoCLOPramide (REGLAN) injection 5 mg (5 mg Intravenous Given 03/12/23 1937)  HYDROcodone-acetaminophen (NORCO/VICODIN) 5-325 MG per tablet 1 tablet (1 tablet Oral Given 03/12/23 2123)  metoCLOPramide (REGLAN) tablet 5 mg (5 mg Oral Given 03/12/23 2123)    ED Course/ Medical Decision Making/ A&P                                 Medical Decision Making    Patient presents to the ED for concern of RLQ abdominal pain, this involves an extensive number of treatment options, and is a complaint that carries with it a high risk of complications and morbidity.  The differential diagnosis includes appendicitis, ovarian torsion, ovarian cyst, endometriosis, kidney stone, ectopic pregnancy.    Co morbidities that complicate the patient evaluation  Weight over 149 kg   Additional history obtained:  Additional history obtained from  Family, Nursing, Outside Medical Records, and Past Admission   External records from outside source obtained and reviewed including urgent care visit and outside medical records.   Lab Tests:  I Ordered, and personally interpreted labs.  The pertinent results include:  Pregnancy test negative BG 107 Leukocytes, UA moderate   Imaging Studies ordered:  I ordered imaging studies including CT abdomen and pelvis with contrast I independently visualized and interpreted imaging which showed several kidney stones with no pathology needing admission. I agree with the radiologist interpretation   Cardiac Monitoring: No cardiac monitoring necessary for this visit.   Medicines ordered and prescription drug management:  I ordered medication including Zofran for nausea and Dilaudid for  pain Reevaluation of the patient after these medicines showed that the patient improved I have reviewed the patients home medicines and have made adjustments as needed   Test Considered:  Transvaginal ultrasound   Critical Interventions:  No critical interventions were given during this encounter/were needed.  Consultations Obtained:  I requested consultation with the attending,  and discussed lab and imaging findings as well as pertinent plan - he recommend: Discharging patient with antispasmodic and nausea medication and sending out culture for  evaluation.   Problem List / ED Course:  Right lower quadrant abdominal pain --patient is a 32 year old female presenting to the ED today complaining of right lower abdominal pain, for the last 2 days.  She states she has a past medical history of ovarian cyst and endometriosis.  Endorses dysuria and nausea. denies hematuria, vaginal bleeding, vaginal itching, shortness of breath, chest pain, constipation, diarrhea, vomiting.  Patient has received Reglan for nausea and Dilaudid for pain which helped.  Labs and CT were reassuring of no emergent pathology present, several small kidney stones present.  Isolated tachycardia noted however is not a concern due to being slightly elevated and no other concerning symptoms and reassuring labs and CT.  Urine culture to be sent off and evaluated will send Rx if returned positive.  Patient to be sent home with Phenergan and dicyclomine.  Also using OTC ibuprofen and/or Tylenol for further pain relief.   Reevaluation:  After the interventions noted above, I reevaluated the patient and found that they have :improved   Dispostion:  After consideration of the diagnostic results and the patients response to treatment, I feel that the patent would benefit from discharge with pain medication and follow-up with PCP as no emergent pathology seems noted at this time.  Cultures sent out and antibiotics/antifungals  will be sent if cultures come back positive.  Patient told of return to ER precautions.   {Final Clinical Impression(s) / ED Diagnoses Final diagnoses:  Right lower quadrant pain    Rx / DC Orders ED Discharge Orders          Ordered    dicyclomine (BENTYL) 20 MG tablet  2 times daily        03/12/23 2116    promethazine (PHENERGAN) 12.5 MG tablet  Every 6 hours PRN        03/12/23 2116              Lavonia Drafts 03/12/23 2146    Eber Hong, MD 03/13/23 1500

## 2023-03-12 NOTE — ED Notes (Signed)
EDP made aware of pt's complaint of nausea & pain increase.

## 2023-03-14 LAB — URINE CULTURE: Culture: NO GROWTH

## 2023-04-22 ENCOUNTER — Encounter: Payer: Self-pay | Admitting: Emergency Medicine

## 2023-04-22 ENCOUNTER — Ambulatory Visit
Admission: EM | Admit: 2023-04-22 | Discharge: 2023-04-22 | Disposition: A | Discharge status: Home / Self Care | Payer: Medicaid Other | Attending: Family Medicine | Admitting: Family Medicine

## 2023-04-22 DIAGNOSIS — M7989 Other specified soft tissue disorders: Secondary | ICD-10-CM | POA: Diagnosis not present

## 2023-04-22 DIAGNOSIS — J209 Acute bronchitis, unspecified: Secondary | ICD-10-CM

## 2023-04-22 MED ORDER — KETOROLAC TROMETHAMINE 30 MG/ML IJ SOLN
30.0000 mg | Freq: Once | INTRAMUSCULAR | Status: AC
  Administered 2023-04-22: 30 mg via INTRAMUSCULAR

## 2023-04-22 MED ORDER — DOXYCYCLINE HYCLATE 100 MG PO CAPS: 100.0000 mg | ORAL_CAPSULE | Freq: Two times a day (BID) | ORAL | 0 refills | Status: DC

## 2023-04-22 MED ORDER — PREDNISONE 20 MG PO TABS: 40.0000 mg | ORAL_TABLET | Freq: Every day | ORAL | 0 refills | Status: DC

## 2023-04-22 MED ORDER — PROMETHAZINE-DM 6.25-15 MG/5ML PO SYRP: 5.0000 mL | ORAL_SOLUTION | Freq: Four times a day (QID) | ORAL | 0 refills | Status: DC | PRN

## 2023-04-22 NOTE — ED Provider Notes (Signed)
[facility]    CSN: [id number] Arrival date & time: 04/22/23  1702      History   Chief Complaint No chief complaint on file.   HPI Janice Taylor is a 32 y.o. female.   Patient presenting today with about a month of ongoing cough, congestion.  Has been seen 3 times by primary care, has not taken courses of Zithromax, Augmentin, Levaquin all without any relief.  Takes antihistamines daily as well.  Denies chest pain, shortness of breath, fever, chills, body aches.  She is also having a painful lump in her right underarm for the past 4 days.  Denies injury to the area, fever, chills, drainage, bleeding.    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   Anterior anal fissure 12/09/2020   Positive fecal occult blood test 12/03/2020   Boil of groin 03/31/2020   Hemorrhoids 03/31/2020   Hypertension 03/31/2020   Encounter for IUD insertion 01/22/2019   Anxiety 11/07/2018   History of ITP 11/25/2011    Past Surgical History:  Procedure Laterality Date   CESAREAN SECTION N/A 12/18/2018   Procedure: CESAREAN SECTION;  Surgeon: Adam Phenix, MD;  Location: [facility] LD ORS;  Service: Obstetrics;  Laterality: N/A;   DIAGNOSTIC LAPAROSCOPY  06/2019   TONSILECTOMY, ADENOIDECTOMY, BILATERAL MYRINGOTOMY AND TUBES      OB History     Gravida  3   Para  2   Term  1   Preterm  1   AB  1   Living  2      SAB  1   IAB      Ectopic      Multiple  0   Live Births  2             Home Medications    Prior to Admission medications   Medication Sig Start Date End Date Taking? Authorizing Provider  doxycycline (VIBRAMYCIN) 100 MG capsule Take 1 capsule (100 mg total) by mouth 2 (two) times daily. 04/22/23  Yes Particia Nearing, PA-C  predniSONE (DELTASONE) 20 MG tablet Take 2 tablets (40 mg total) by mouth daily with breakfast. 04/22/23  Yes Particia Nearing, PA-C  promethazine-dextromethorphan (PROMETHAZINE-DM) 6.25-15 MG/5ML syrup Take 5 mLs by mouth 4 (four) times daily as needed. 04/22/23  Yes Particia Nearing, PA-C  albuterol (VENTOLIN HFA) 108 (90 Base) MCG/ACT inhaler Inhale 2 puffs into the lungs every 6 (six) hours as needed for wheezing or shortness of breath. 01/28/22   Leath-Warren, Sadie Haber, NP  ALPRAZolam Prudy Feeler) 0.5 MG tablet Take 1 tablet (0.5 mg total) by mouth in the morning, then 1 tablet (0.5 mg total) in the afternoon, and then 0.5 tablet (0.25 mg total) in the evening. 09/23/22     amitriptyline (ELAVIL) 25 MG tablet Take by mouth. 09/30/21   [provider]  amLODipine (NORVASC) 10 MG tablet Take 10 mg by mouth daily.  02/14/20   [provider]  busPIRone (BUSPAR) 15 MG tablet Take 15 mg by mouth 3 (three) times daily.    [provider]  cetirizine (ZYRTEC ALLERGY) 10 MG tablet Take 1 tablet (10 mg total) by mouth daily. 01/19/22   Wallis Bamberg, PA-C  cyclobenzaprine (FLEXERIL) 10 MG tablet Take 1 tablet (10 mg total) by mouth every 8 (eight) hours as needed for muscle spasms. 01/05/23   Myna Hidalgo, DO  dicyclomine (BENTYL) 20 MG tablet Take 1 tablet (20 mg total) by mouth 2 (two) times daily. 03/12/23   Lunette Stands, PA-C  DULoxetine (CYMBALTA) 60 MG capsule Take 60 mg by mouth daily.    [provider]  empagliflozin (JARDIANCE) 25 MG TABS tablet Take by mouth daily.    [provider]  fluconazole (DIFLUCAN) 150 MG tablet Take 1 tablet today and repeat in 3 days 03/08/23    Lazaro Arms, MD  hydrochlorothiazide (HYDRODIURIL) 25 MG tablet Take 25 mg by mouth daily.    [provider]  ketorolac (TORADOL) 10 MG tablet Take 1 tablet (10 mg total) by mouth every 8 (eight) hours as needed. 02/03/23   Myna Hidalgo, DO  lamoTRIgine (LAMICTAL) 100 MG tablet Take 100 mg by mouth daily.    [provider]  megestrol (MEGACE) 40 MG tablet Take 2 tablets (80 mg total) by mouth daily. 01/11/23   Lazaro Arms, MD  metFORMIN (GLUCOPHAGE) 500 MG tablet Take by mouth 2 (two) times daily with a meal.    [provider]  metoCLOPramide (REGLAN) 5 MG tablet Take 1 tablet (5 mg total) by mouth every 8 (eight) hours as needed for up to 5 days for nausea. 02/02/23 02/07/23  Myna Hidalgo, DO  oxyCODONE-acetaminophen (PERCOCET) 5-325 MG tablet Take 1 tablet by mouth every 6 (six) hours as needed for severe pain. 01/14/23   Lazaro Arms, MD  promethazine (PHENERGAN) 12.5 MG tablet Take 1 tablet (12.5 mg total) by mouth every 6 (six) hours as needed for nausea or vomiting. 03/12/23   Lunette Stands, PA-C  QUEtiapine (SEROQUEL) 50 MG tablet Take 50 mg by mouth at bedtime.    [provider]    Family History Family History  Problem Relation Age of Onset   Diabetes Maternal Uncle    Kidney disease Maternal Uncle        renal failure/dialysis   Heart attack Maternal Grandmother     Social History Social History   Tobacco Use   Smoking status: Never   Smokeless tobacco: Never  Vaping Use   Vaping status: Never Used  Substance Use Topics   Alcohol use: No   Drug use: No     Allergies   Ondansetron hcl and Penicillins   Review of Systems Review of Systems Per HPI  Physical Exam Triage Vital Signs ED Triage Vitals  Encounter Vitals Group     BP 04/22/23 1718 (!) 170/89     Systolic BP Percentile --      Diastolic BP Percentile --      Pulse Rate 04/22/23 1718 (!) 105     Resp 04/22/23 1718 18     Temp 04/22/23 1718 98.5 F  (36.9 C)     Temp Source 04/22/23 1718 Oral     SpO2 04/22/23 1718 93 %     Weight --      Height --      Head Circumference --      Peak Flow --  Pain Score 04/22/23 1720 3     Pain Loc --      Pain Education --      Exclude from Growth Chart --    No data found.  Updated Vital Signs BP (!) 170/89 (BP Location: Right Arm)   Pulse (!) 105   Temp 98.5 F (36.9 C) (Oral)   Resp 18   LMP 04/20/2023 (Exact Date)   SpO2 93%   Visual Acuity Right Eye Distance:   Left Eye Distance:   Bilateral Distance:    Right Eye Near:   Left Eye Near:    Bilateral Near:     Physical Exam Vitals and nursing note reviewed.  Constitutional:      Appearance: Normal appearance. She is not ill-appearing.  HENT:     Head: Atraumatic.     Nose: Nose normal.     Mouth/Throat:     Mouth: Mucous membranes are moist.     Pharynx: Oropharynx is clear.  Eyes:     Extraocular Movements: Extraocular movements intact.     Conjunctiva/sclera: Conjunctivae normal.  Cardiovascular:     Rate and Rhythm: Normal rate and regular rhythm.     Heart sounds: Normal heart sounds.  Pulmonary:     Effort: Pulmonary effort is normal.     Breath sounds: Normal breath sounds. No wheezing or rales.  Musculoskeletal:        General: Normal range of motion.     Cervical back: Normal range of motion and neck supple.  Skin:    General: Skin is warm and dry.     Comments: Firm oval-shaped palpable mass to right axilla, no erythema, induration, fluctuance  Neurological:     Mental Status: She is alert and oriented to person, place, and time.  Psychiatric:        Mood and Affect: Mood normal.        Thought Content: Thought content normal.        Judgment: Judgment normal.      UC Treatments / Results  Labs (all labs ordered are listed, but only abnormal results are displayed) Labs Reviewed - No data to display  EKG   Radiology No results found.  Procedures Procedures (including critical care  time)  Medications Ordered in UC Medications  ketorolac (TORADOL) 30 MG/ML injection 30 mg (30 mg Intramuscular Given 04/22/23 1812)    Initial Impression / Assessment and Plan / UC Course  I have reviewed the triage vital signs and the nursing notes.  Pertinent labs & imaging results that were available during my care of the patient were reviewed by me and considered in my medical decision making (see chart for details).     Patient is requesting IM Toradol for pain in the right axilla, this was given today prior to discharge.  She is also requesting I&D but unclear if this is lymphadenopathy versus a new abscess so will trial warm compresses, antibiotics and follow-up with PCP.  Suspect some ongoing bronchitis causing her continuous cough, has already been through multiple courses of antibiotics with no relief so we will treat with prednisone, Phenergan DM and consistent allergy regimen.  Return for worsening symptoms.  Final Clinical Impressions(s) / UC Diagnoses   Final diagnoses:  Acute bronchitis, unspecified organism  Right axillary swelling     Discharge Instructions      I have sent over an antibiotic for the painful area to the right underarm.  You may also do warm compresses, over-the-counter pain relievers.  If not resolving or worsening follow-up with primary care for a recheck.  Have also sent over a course of steroid and cough syrup for your ongoing cough.  Follow-up if not resolving.    ED Prescriptions     Medication Sig Dispense Auth. Provider   doxycycline (VIBRAMYCIN) 100 MG capsule Take 1 capsule (100 mg total) by mouth 2 (two) times daily. 14 capsule Particia Nearing, [place]   predniSONE (DELTASONE) 20 MG tablet Take 2 tablets (40 mg total) by mouth daily with breakfast. 10 tablet Particia Nearing, PA-C   promethazine-dextromethorphan (PROMETHAZINE-DM) 6.25-15 MG/5ML syrup Take 5 mLs by mouth 4 (four) times daily as needed. 100 mL Particia Nearing, [place]      PDMP not reviewed this encounter.   Particia Nearing, [place] 04/22/23 [phone]

## 2023-04-22 NOTE — ED Triage Notes (Signed)
Productive Cough, nasal congestion x 1 week.  States she also has a cyst in right arm pit.

## 2023-04-22 NOTE — Discharge Instructions (Addendum)
I have sent over an antibiotic for the painful area to the right underarm.  You may also do warm compresses, over-the-counter pain relievers.  If not resolving or worsening follow-up with primary care for a recheck.  Have also sent over a course of steroid and cough syrup for your ongoing cough.  Follow-up if not resolving.

## 2023-05-28 ENCOUNTER — Encounter: Payer: Self-pay | Admitting: Obstetrics & Gynecology

## 2023-07-29 ENCOUNTER — Emergency Department (HOSPITAL_COMMUNITY)

## 2023-07-29 ENCOUNTER — Other Ambulatory Visit: Payer: Self-pay

## 2023-07-29 ENCOUNTER — Emergency Department (HOSPITAL_COMMUNITY)
Admission: EM | Admit: 2023-07-29 | Discharge: 2023-07-30 | Disposition: A | Discharge status: Home / Self Care | Attending: Emergency Medicine | Admitting: Emergency Medicine

## 2023-07-29 ENCOUNTER — Encounter (HOSPITAL_COMMUNITY): Payer: Self-pay

## 2023-07-29 DIAGNOSIS — Z79899 Other long term (current) drug therapy: Secondary | ICD-10-CM | POA: Insufficient documentation

## 2023-07-29 DIAGNOSIS — I10 Essential (primary) hypertension: Secondary | ICD-10-CM | POA: Insufficient documentation

## 2023-07-29 DIAGNOSIS — R0789 Other chest pain: Secondary | ICD-10-CM | POA: Diagnosis present

## 2023-07-29 LAB — CBC
HCT: 40.9 % (ref 36.0–46.0)
Hemoglobin: 12.5 g/dL (ref 12.0–15.0)
MCH: 25 pg — ABNORMAL LOW (ref 26.0–34.0)
MCHC: 30.6 g/dL (ref 30.0–36.0)
MCV: 81.6 fL (ref 80.0–100.0)
Platelets: 260 10*3/uL (ref 150–400)
RBC: 5.01 MIL/uL (ref 3.87–5.11)
RDW: 15.9 % — ABNORMAL HIGH (ref 11.5–15.5)
WBC: 8.2 10*3/uL (ref 4.0–10.5)
nRBC: 0 % (ref 0.0–0.2)

## 2023-07-29 LAB — BASIC METABOLIC PANEL WITH GFR
Anion gap: 10 (ref 5–15)
BUN: 12 mg/dL (ref 6–20)
CO2: 26 mmol/L (ref 22–32)
Calcium: 8.9 mg/dL (ref 8.9–10.3)
Chloride: 102 mmol/L (ref 98–111)
Creatinine, Ser: 1.05 mg/dL — ABNORMAL HIGH (ref 0.44–1.00)
GFR, Estimated: >60 mL/min (ref >60)
Glucose, Bld: 83 mg/dL (ref 70–99)
Potassium: 3.9 mmol/L (ref 3.5–5.1)
Sodium: 138 mmol/L (ref 135–145)

## 2023-07-29 LAB — D-DIMER, QUANTITATIVE: D-Dimer, Quant: <0.27 ug{FEU}/mL (ref 0.00–0.50)

## 2023-07-29 LAB — PREGNANCY, URINE: Preg Test, Ur: NEGATIVE

## 2023-07-29 LAB — TROPONIN I (HIGH SENSITIVITY): Troponin I (High Sensitivity): 3 ng/L (ref <18)

## 2023-07-29 MED ORDER — KETOROLAC TROMETHAMINE 15 MG/ML IJ SOLN
15.0000 mg | Freq: Once | INTRAMUSCULAR | Status: AC
  Administered 2023-07-29: 15 mg via INTRAVENOUS
  Filled 2023-07-29: qty 1

## 2023-07-29 MED ORDER — ALUM & MAG HYDROXIDE-SIMETH 200-200-20 MG/5ML PO SUSP
30.0000 mL | Freq: Once | ORAL | Status: AC
  Administered 2023-07-29: 30 mL via ORAL
  Filled 2023-07-29: qty 30

## 2023-07-29 MED ORDER — PROMETHAZINE (PHENERGAN) 6.25MG IN NS 50ML IVPB: 6.2500 mg | Freq: Four times a day (QID) | INTRAVENOUS | Status: DC | PRN

## 2023-07-29 MED ORDER — HYOSCYAMINE SULFATE 0.125 MG SL SUBL
0.2500 mg | SUBLINGUAL_TABLET | Freq: Once | SUBLINGUAL | Status: AC
  Administered 2023-07-29: 0.25 mg via SUBLINGUAL
  Filled 2023-07-29: qty 2

## 2023-07-29 NOTE — ED Provider Notes (Signed)
 [place] EMERGENCY DEPARTMENT AT [facility] : [facility] Provider Note   CSN: [id number] Arrival date & time: 07/29/23  2120     History  Chief Complaint  Patient presents with   Chest Pain    Janice Taylor is a 33 y.o. female.  Patient with past medical history of anxiety, hemorrhoids, hypertension presents to emergency room with complaint of chest pain.  Patient reports that chest pain started 4 hours ago.  It is located in anterior left-sided chest wall.  Reports that she does not feel that it radiates but it is worse when she is moving her left shoulder.  This started after eating describes it as a sharp shooting pain.  Associate with nausea.  She reports she tried "walking it out."  Symptoms persisted.  Hurts with movement and taking a deep breath in. No associated abdominal pain, vomiting, diarrhea, headache or shortness of breath.   Chest Pain      Home Medications Prior to Admission medications   Medication Sig Start Date End Date Taking? Authorizing Provider  albuterol (VENTOLIN HFA) 108 (90 Base) MCG/ACT inhaler Inhale 2 puffs into the lungs every 6 (six) hours as needed for wheezing or shortness of breath. 01/28/22   Leath-Warren, Sadie Haber, NP  ALPRAZolam Prudy Feeler) 0.5 MG tablet Take 1 tablet (0.5 mg total) by mouth in the morning, then 1 tablet (0.5 mg total) in the afternoon, and then 0.5 tablet (0.25 mg total) in the evening. 09/23/22     amitriptyline (ELAVIL) 25 MG tablet Take by mouth. 09/30/21   [provider]  amLODipine (NORVASC) 10 MG tablet Take 10 mg by mouth daily. 02/14/20   [provider]  busPIRone (BUSPAR) 15 MG tablet Take 15 mg by mouth 3 (three) times daily.    [provider]  cetirizine (ZYRTEC ALLERGY) 10 MG tablet Take 1 tablet (10 mg total) by mouth daily. 01/19/22   Wallis Bamberg, PA-C  cyclobenzaprine (FLEXERIL) 10 MG tablet Take 1 tablet (10 mg total) by mouth every 8 (eight) hours as needed for muscle spasms. 01/05/23    Myna Hidalgo, DO  dicyclomine (BENTYL) 20 MG tablet Take 1 tablet (20 mg total) by mouth 2 (two) times daily. 03/12/23   Lunette Stands, PA-C  doxycycline (VIBRAMYCIN) 100 MG capsule Take 1 capsule (100 mg total) by mouth 2 (two) times daily. 04/22/23   Particia Nearing, PA-C  DULoxetine (CYMBALTA) 60 MG capsule Take 60 mg by mouth daily.    [provider]  empagliflozin (JARDIANCE) 25 MG TABS tablet Take by mouth daily.    [provider]  fluconazole (DIFLUCAN) 150 MG tablet Take 1 tablet today and repeat in 3 days 03/08/23   Lazaro Arms, MD  hydrochlorothiazide (HYDRODIURIL) 25 MG tablet Take 25 mg by mouth daily.    [provider]  ketorolac (TORADOL) 10 MG tablet Take 1 tablet (10 mg total) by mouth every 8 (eight) hours as needed. 02/03/23   Myna Hidalgo, DO  lamoTRIgine (LAMICTAL) 100 MG tablet Take 100 mg by mouth daily.    [provider]  megestrol (MEGACE) 40 MG tablet Take 2 tablets (80 mg total) by mouth daily. 01/11/23   Lazaro Arms, MD  metFORMIN (GLUCOPHAGE) 500 MG tablet Take by mouth 2 (two) times daily with a meal.    [provider]  metoCLOPramide (REGLAN) 5 MG tablet Take 1 tablet (5 mg total) by mouth every 8 (eight) hours as needed for up to 5 days for  nausea. 02/02/23 02/07/23  Myna Hidalgo, DO  oxyCODONE-acetaminophen (PERCOCET) 5-325 MG tablet Take 1 tablet by mouth every 6 (six) hours as needed for severe pain. 01/14/23   Lazaro Arms, MD  predniSONE (DELTASONE) 20 MG tablet Take 2 tablets (40 mg total) by mouth daily with breakfast. 04/22/23   Particia Nearing, PA-C  promethazine (PHENERGAN) 12.5 MG tablet Take 1 tablet (12.5 mg total) by mouth every 6 (six) hours as needed for nausea or vomiting. 03/12/23   Lunette Stands, PA-C  promethazine-dextromethorphan (PROMETHAZINE-DM) 6.25-15 MG/5ML syrup Take 5 mLs by mouth 4 (four) times daily as needed. 04/22/23   Particia Nearing, PA-C  QUEtiapine  (SEROQUEL) 50 MG tablet Take 50 mg by mouth at bedtime.    [provider]      Allergies    Ondansetron hcl and Penicillins    Review of Systems   Review of Systems  Cardiovascular:  Positive for chest pain.    Physical Exam Updated Vital Signs BP (!) 154/87 (BP Location: Right Arm)   Pulse (!) 102   Temp 99.5 F (37.5 C) (Oral)   Resp 16   Ht 5\' 4"  (1.626 m)   Wt (!) 158.8 kg   LMP 07/25/2023 (Exact Date)   SpO2 95%   BMI 60.08 kg/m  Physical Exam Vitals and nursing note reviewed.  Constitutional:      General: She is not in acute distress.    Appearance: She is not toxic-appearing.  HENT:     Head: Normocephalic and atraumatic.  Eyes:     General: No scleral icterus.    Conjunctiva/sclera: Conjunctivae normal.  Cardiovascular:     Rate and Rhythm: Normal rate and regular rhythm.     Pulses: Normal pulses.     Heart sounds: Normal heart sounds.  Pulmonary:     Effort: Pulmonary effort is normal. No respiratory distress.     Breath sounds: Normal breath sounds.  Chest:     Chest wall: Tenderness present.  Abdominal:     General: Abdomen is flat. Bowel sounds are normal.     Palpations: Abdomen is soft.     Tenderness: There is no abdominal tenderness.  Musculoskeletal:     Right lower leg: No edema.     Left lower leg: No edema.  Skin:    General: Skin is warm and dry.     Findings: No lesion.  Neurological:     General: No focal deficit present.     Mental Status: She is alert and oriented to person, place, and time. Mental status is at baseline.     ED Results / Procedures / Treatments   Labs (all labs ordered are listed, but only abnormal results are displayed) Labs Reviewed  BASIC METABOLIC PANEL WITH GFR - Abnormal; Notable for the following components:      Result Value   Creatinine, Ser 1.05 (*)    All other components within normal limits  CBC - Abnormal; Notable for the following components:   MCH 25.0 (*)    RDW 15.9 (*)    All  other components within normal limits  PREGNANCY, URINE  D-DIMER, QUANTITATIVE  TROPONIN I (HIGH SENSITIVITY)  TROPONIN I (HIGH SENSITIVITY)    EKG EKG Interpretation Date/Time:  Friday July 29 2023 21:40:38 EDT Ventricular Rate:  99 PR Interval:  139 QRS Duration:  96 QT Interval:  359 QTC Calculation: 461 R Axis:   89  Text Interpretation: Sinus tachycardia Confirmed by Vonita Moss [phone])  on 07/29/2023 10:05:41 PM  Radiology DG Chest 2 View Result Date: 07/29/2023 CLINICAL DATA:  Left-sided chest pain. EXAM: CHEST - 2 VIEW COMPARISON:  01/29/2022. FINDINGS: The heart size and mediastinal contours are within normal limits. No consolidation, effusion, or pneumothorax. No acute osseous abnormality is seen. IMPRESSION: No active cardiopulmonary disease. Electronically Signed   By: Thornell Sartorius M.D.   On: 07/29/2023 22:16    Procedures Procedures    Medications Ordered in ED Medications  promethazine (PHENERGAN) 6.25 mg/NS 50 mL IVPB (has no administration in time range)  ketorolac (TORADOL) 15 MG/ML injection 15 mg (has no administration in time range)  alum & mag hydroxide-simeth (MAALOX/MYLANTA) 200-200-20 MG/5ML suspension 30 mL (30 mLs Oral Given 07/29/23 2234)  hyoscyamine (LEVSIN SL) SL tablet 0.25 mg (0.25 mg Sublingual Given 07/29/23 2234)    ED Course/ Medical Decision Making/ A&P Clinical Course as of 07/30/23 1150  Sat Jul 30, 2023  0101 Troponin I (High Sensitivity): 3 neg [HN]    Clinical Course User Index [HN] Loetta Rough, MD                                 Medical Decision Making Amount and/or Complexity of Data Reviewed Labs: ordered. Decision-making details documented in ED Course. Radiology: ordered.  Risk OTC drugs. Prescription drug management.   This patient presents to the ED for concern of chest pain, this involves an extensive number of treatment options, and is a complaint that carries with it a high risk of complications and  morbidity.  The differential diagnosis includes    Co morbidities that complicate the patient evaluation  Obesity on Jardiance    Lab Tests:  I personally interpreted labs.  The pertinent results include:   CBC without leukocytosis no anemia. BMP without electrolyte abnormality.  D-dimer within normal limits.  Troponin within normal limits.  Urine pregnancy test negative   Imaging Studies ordered:  I ordered imaging studies including chest x-ray  I independently visualized and interpreted imaging which showed negative  I agree with the radiologist interpretation   Cardiac Monitoring: / EKG:  The patient was maintained on a cardiac monitor.  I personally viewed and interpreted the cardiac monitored which showed an underlying rhythm of: sinus tachycardia    Problem List / ED Course / Critical interventions / Medication management  Patient reporting no chest pain.  She reports chest pain is constant.  Is worse when taking a deep breath.  Her lungs are clear to auscultation bilaterally.  She has no sign of respiratory distress.  No abdominal pain.  No lower extremity edema or sign of DVT.  No history of DVT PE.  Given that she is tachycardic will obtain D-dimer to rule out PE.  Will chest x-ray, EKG and Troponin. She reports some associated nausea and is unsure if this feels like GERD, will try GI cocktail and reassess.  D-dimer negative thus doubt PE.  She has no sign of fluid overload on exam or chest x-ray this doubt CHF.  Initial troponin is negative.  EKG without any ST changes.  Chest x-ray without any pneumonia or pneumothorax.  She feels mild improvement after GI cocktail.  Given that she has reproducible tenderness on exam and worse with movement suspect this is chest wall pain.  Will give Toradol. I ordered medication including GI cocktail, toradol  Reevaluation of the patient after these medicines showed that the patient improved I  have reviewed the patients home medicines and  have made adjustments as needed   Plan  Patient was signed off at shift change.  Discharge is pending repeat troponin. She is stable, if troponin is negative anticipate stable for discharge.         Final Clinical Impression(s) / ED Diagnoses Final diagnoses:  Chest wall pain    Rx / DC Orders ED Discharge Orders     None         Smitty Knudsen, PA-C 07/30/23 1151    Loetta Rough, MD 07/31/23 [phone]

## 2023-07-29 NOTE — ED Triage Notes (Signed)
 Pov from home. Cc of left side chest pain. Constant pain. Feels like stabbing pain. Had just came in the house when it started at  630 tonight. Took 3 baby aspirin.

## 2023-07-29 NOTE — ED Notes (Signed)
Pain remains unchanged.

## 2023-07-30 LAB — TROPONIN I (HIGH SENSITIVITY): Troponin I (High Sensitivity): 3 ng/L (ref <18)

## 2023-07-30 MED ORDER — ACETAMINOPHEN 500 MG PO TABS
1000.0000 mg | ORAL_TABLET | Freq: Once | ORAL | Status: AC
  Administered 2023-07-30: 1000 mg via ORAL
  Filled 2023-07-30: qty 2

## 2023-07-30 MED ORDER — OXYCODONE HCL 5 MG PO TABS
5.0000 mg | ORAL_TABLET | Freq: Once | ORAL | Status: AC
  Administered 2023-07-30: 5 mg via ORAL
  Filled 2023-07-30: qty 1

## 2023-07-30 NOTE — ED Provider Notes (Signed)
 12:03 AM Assumed care of patient from off-going team. For more details, please see note from same day.  In brief, this is a 33 y.o. female who p/w CP, left sided, pleuritic and worse with movement. W/u so far reassuring inc neg dimer. Has chest wall pain and pain with movement, likely MSK  Plan/Dispo at time of sign-out & ED Course since sign-out: [ ]  repeat trop  BP (!) 154/87 (BP Location: Right Arm)   Pulse (!) 102   Temp 99.5 F (37.5 C) (Oral)   Resp 16   Ht 5\' 4"  (1.626 m)   Wt (!) 158.8 kg   LMP 07/25/2023 (Exact Date)   SpO2 95%   BMI 60.08 kg/m    ED Course:   Clinical Course as of 07/30/23 0457  Sat Jul 30, 2023  0101 Troponin I (High Sensitivity): 3 neg [HN]    Clinical Course User Index [HN] Loetta Rough, MD   Patient is overall very well-appearing.  Low heart score.  Patient has tenderness to palpation in her anterior left chest, consider possible costochondritis.  She states she is still very uncomfortable after the Tylenol and Toradol, will give a single dose of Oxy prior to discharge.  Patient also reports recent anxiety over her daughter learning to drive, it is possible that anxiety is contributing to her symptoms though this does not necessarily explain the tenderness to palpation.  I advised patient alternate Tylenol ibuprofen at home and to follow-up with her PCP.  Given discharge instructions and return precautions, all questions answered to patient satisfaction.  Dispo: DC ------------------------------- Vivi Barrack, MD Emergency Medicine  This note was created using dictation software, which may contain spelling or grammatical errors.   Loetta Rough, MD 07/30/23 [phone]

## 2023-07-30 NOTE — Discharge Instructions (Signed)
 Thank you for coming to [facility] Emergency Department. You were seen for chest pain. We did an exam, labs, and imaging, and these showed no acute findings. Likely your pain is musculoskeletal from your chest wall. You can alternate taking Tylenol and ibuprofen as needed for pain. You can take 650mg  tylenol (acetaminophen) every 4-6 hours, and 600 mg ibuprofen 3 times a day.  Please follow up with your primary care provider within 1 week as needed if symptoms do not improve.   Do not hesitate to return to the ED or call 911 if you experience: -Worsening symptoms -Lightheadedness, passing out -Fevers/chills -Anything else that concerns you

## 2023-08-10 ENCOUNTER — Emergency Department (HOSPITAL_COMMUNITY)

## 2023-08-10 ENCOUNTER — Other Ambulatory Visit: Payer: Self-pay

## 2023-08-10 ENCOUNTER — Inpatient Hospital Stay (HOSPITAL_COMMUNITY)
Admission: EM | Admit: 2023-08-10 | Discharge: 2023-08-14 | DRG: 193 | Disposition: A | Discharge status: Home / Self Care | Attending: Internal Medicine | Admitting: Internal Medicine

## 2023-08-10 ENCOUNTER — Encounter (HOSPITAL_COMMUNITY): Payer: Self-pay | Admitting: Emergency Medicine

## 2023-08-10 DIAGNOSIS — E119 Type 2 diabetes mellitus without complications: Secondary | ICD-10-CM | POA: Diagnosis present

## 2023-08-10 DIAGNOSIS — J309 Allergic rhinitis, unspecified: Secondary | ICD-10-CM | POA: Diagnosis present

## 2023-08-10 DIAGNOSIS — Z880 Allergy status to penicillin: Secondary | ICD-10-CM

## 2023-08-10 DIAGNOSIS — Z8249 Family history of ischemic heart disease and other diseases of the circulatory system: Secondary | ICD-10-CM | POA: Diagnosis not present

## 2023-08-10 DIAGNOSIS — R0602 Shortness of breath: Secondary | ICD-10-CM | POA: Diagnosis present

## 2023-08-10 DIAGNOSIS — Z79899 Other long term (current) drug therapy: Secondary | ICD-10-CM | POA: Diagnosis not present

## 2023-08-10 DIAGNOSIS — R0902 Hypoxemia: Secondary | ICD-10-CM | POA: Diagnosis present

## 2023-08-10 DIAGNOSIS — F32A Depression, unspecified: Secondary | ICD-10-CM | POA: Diagnosis present

## 2023-08-10 DIAGNOSIS — J9601 Acute respiratory failure with hypoxia: Secondary | ICD-10-CM | POA: Diagnosis present

## 2023-08-10 DIAGNOSIS — F411 Generalized anxiety disorder: Secondary | ICD-10-CM | POA: Diagnosis present

## 2023-08-10 DIAGNOSIS — Z833 Family history of diabetes mellitus: Secondary | ICD-10-CM

## 2023-08-10 DIAGNOSIS — F909 Attention-deficit hyperactivity disorder, unspecified type: Secondary | ICD-10-CM | POA: Diagnosis present

## 2023-08-10 DIAGNOSIS — Z6841 Body Mass Index (BMI) 40.0 and over, adult: Secondary | ICD-10-CM | POA: Diagnosis not present

## 2023-08-10 DIAGNOSIS — Z7984 Long term (current) use of oral hypoglycemic drugs: Secondary | ICD-10-CM | POA: Diagnosis not present

## 2023-08-10 DIAGNOSIS — Z888 Allergy status to other drugs, medicaments and biological substances status: Secondary | ICD-10-CM

## 2023-08-10 DIAGNOSIS — E876 Hypokalemia: Secondary | ICD-10-CM | POA: Diagnosis present

## 2023-08-10 DIAGNOSIS — Z7952 Long term (current) use of systemic steroids: Secondary | ICD-10-CM

## 2023-08-10 DIAGNOSIS — I10 Essential (primary) hypertension: Secondary | ICD-10-CM | POA: Diagnosis present

## 2023-08-10 DIAGNOSIS — J301 Allergic rhinitis due to pollen: Secondary | ICD-10-CM

## 2023-08-10 DIAGNOSIS — E66813 Obesity, class 3: Secondary | ICD-10-CM | POA: Diagnosis present

## 2023-08-10 DIAGNOSIS — J189 Pneumonia, unspecified organism: Principal | ICD-10-CM | POA: Diagnosis present

## 2023-08-10 LAB — COMPREHENSIVE METABOLIC PANEL WITH GFR
ALT: 17 U/L (ref 0–44)
AST: 22 U/L (ref 15–41)
Albumin: 3.1 g/dL — ABNORMAL LOW (ref 3.5–5.0)
Alkaline Phosphatase: 85 U/L (ref 38–126)
Anion gap: 11 (ref 5–15)
BUN: 9 mg/dL (ref 6–20)
CO2: 24 mmol/L (ref 22–32)
Calcium: 8 mg/dL — ABNORMAL LOW (ref 8.9–10.3)
Chloride: 101 mmol/L (ref 98–111)
Creatinine, Ser: 1.13 mg/dL — ABNORMAL HIGH (ref 0.44–1.00)
GFR, Estimated: >60 mL/min (ref >60)
Glucose, Bld: 104 mg/dL — ABNORMAL HIGH (ref 70–99)
Potassium: 3.4 mmol/L — ABNORMAL LOW (ref 3.5–5.1)
Sodium: 136 mmol/L (ref 135–145)
Total Bilirubin: 0.5 mg/dL (ref 0.0–1.2)
Total Protein: 5.4 g/dL — ABNORMAL LOW (ref 6.5–8.1)

## 2023-08-10 LAB — URINALYSIS, W/ REFLEX TO CULTURE (INFECTION SUSPECTED)
Bacteria, UA: NONE SEEN
Bilirubin Urine: NEGATIVE
Glucose, UA: >=500 mg/dL — AB
Hgb urine dipstick: NEGATIVE
Ketones, ur: NEGATIVE mg/dL
Leukocytes,Ua: NEGATIVE
Nitrite: NEGATIVE
Protein, ur: NEGATIVE mg/dL
Specific Gravity, Urine: 1.016 (ref 1.005–1.030)
pH: 5 (ref 5.0–8.0)

## 2023-08-10 LAB — CBC WITH DIFFERENTIAL/PLATELET
Abs Immature Granulocytes: 0.03 10*3/uL (ref 0.00–0.07)
Basophils Absolute: 0 10*3/uL (ref 0.0–0.1)
Basophils Relative: 0 %
Eosinophils Absolute: 0.1 10*3/uL (ref 0.0–0.5)
Eosinophils Relative: 1 %
HCT: 37.3 % (ref 36.0–46.0)
Hemoglobin: 11.6 g/dL — ABNORMAL LOW (ref 12.0–15.0)
Immature Granulocytes: 1 %
Lymphocytes Relative: 15 %
Lymphs Abs: 0.9 10*3/uL (ref 0.7–4.0)
MCH: 25.1 pg — ABNORMAL LOW (ref 26.0–34.0)
MCHC: 31.1 g/dL (ref 30.0–36.0)
MCV: 80.7 fL (ref 80.0–100.0)
Monocytes Absolute: 0.4 10*3/uL (ref 0.1–1.0)
Monocytes Relative: 7 %
Neutro Abs: 4.3 10*3/uL (ref 1.7–7.7)
Neutrophils Relative %: 76 %
Platelets: 217 10*3/uL (ref 150–400)
RBC: 4.62 MIL/uL (ref 3.87–5.11)
RDW: 16 % — ABNORMAL HIGH (ref 11.5–15.5)
WBC: 5.6 10*3/uL (ref 4.0–10.5)
nRBC: 0 % (ref 0.0–0.2)

## 2023-08-10 LAB — HCG, SERUM, QUALITATIVE: Preg, Serum: NEGATIVE

## 2023-08-10 LAB — RESP PANEL BY RT-PCR (RSV, FLU A&B, COVID)  RVPGX2
Influenza A by PCR: NEGATIVE
Influenza B by PCR: NEGATIVE
Resp Syncytial Virus by PCR: NEGATIVE
SARS Coronavirus 2 by RT PCR: NEGATIVE

## 2023-08-10 LAB — PROTIME-INR
INR: 1.1 (ref 0.8–1.2)
Prothrombin Time: 14.7 s (ref 11.4–15.2)

## 2023-08-10 LAB — I-STAT CG4 LACTIC ACID, ED: Lactic Acid, Venous: 1.4 mmol/L (ref 0.5–1.9)

## 2023-08-10 MED ORDER — BENZONATATE 100 MG PO CAPS
200.0000 mg | ORAL_CAPSULE | Freq: Once | ORAL | Status: AC
  Administered 2023-08-10: 200 mg via ORAL
  Filled 2023-08-10: qty 2

## 2023-08-10 MED ORDER — SODIUM CHLORIDE 0.9 % IV SOLN
2.0000 g | Freq: Once | INTRAVENOUS | Status: AC
  Administered 2023-08-10: 2 g via INTRAVENOUS
  Filled 2023-08-10: qty 20

## 2023-08-10 MED ORDER — GUAIFENESIN-DM 100-10 MG/5ML PO SYRP
5.0000 mL | ORAL_SOLUTION | ORAL | Status: DC | PRN
  Administered 2023-08-10 – 2023-08-11 (×2): 5 mL via ORAL
  Filled 2023-08-10 (×2): qty 5

## 2023-08-10 MED ORDER — INSULIN ASPART 100 UNIT/ML IJ SOLN: 0.0000 [IU] | Freq: Three times a day (TID) | INTRAMUSCULAR | Status: DC

## 2023-08-10 MED ORDER — MELATONIN 3 MG PO TABS
3.0000 mg | ORAL_TABLET | Freq: Every evening | ORAL | Status: DC | PRN
  Administered 2023-08-11 – 2023-08-13 (×2): 3 mg via ORAL
  Filled 2023-08-10 (×2): qty 1

## 2023-08-10 MED ORDER — SODIUM CHLORIDE 0.9 % IV SOLN: 12.5000 mg | Freq: Four times a day (QID) | INTRAVENOUS | Status: DC | PRN

## 2023-08-10 MED ORDER — LEVALBUTEROL HCL 1.25 MG/0.5ML IN NEBU
1.2500 mg | INHALATION_SOLUTION | RESPIRATORY_TRACT | Status: DC | PRN
  Administered 2023-08-11: 1.25 mg via RESPIRATORY_TRACT
  Filled 2023-08-10: qty 0.5

## 2023-08-10 MED ORDER — ACETAMINOPHEN 325 MG PO TABS
650.0000 mg | ORAL_TABLET | Freq: Four times a day (QID) | ORAL | Status: DC | PRN
  Administered 2023-08-11 – 2023-08-13 (×2): 650 mg via ORAL
  Filled 2023-08-10 (×2): qty 2

## 2023-08-10 MED ORDER — SODIUM CHLORIDE 0.9 % IV SOLN
500.0000 mg | INTRAVENOUS | Status: DC
  Administered 2023-08-11: 500 mg via INTRAVENOUS
  Filled 2023-08-10 (×2): qty 5

## 2023-08-10 MED ORDER — LACTATED RINGERS IV SOLN: INTRAVENOUS | Status: DC

## 2023-08-10 MED ORDER — SODIUM CHLORIDE 0.9 % IV SOLN
12.5000 mg | Freq: Once | INTRAVENOUS | Status: AC
Start: 2023-08-10 — End: 2023-08-10
  Administered 2023-08-10: 12.5 mg via INTRAVENOUS
  Filled 2023-08-10: qty 12.5

## 2023-08-10 MED ORDER — ACETAMINOPHEN 650 MG RE SUPP: 650.0000 mg | Freq: Four times a day (QID) | RECTAL | Status: DC | PRN

## 2023-08-10 MED ORDER — SODIUM CHLORIDE 0.9 % IV SOLN
1.0000 g | INTRAVENOUS | Status: DC
  Administered 2023-08-11 – 2023-08-13 (×3): 1 g via INTRAVENOUS
  Filled 2023-08-10 (×3): qty 10

## 2023-08-10 MED ORDER — POTASSIUM CHLORIDE CRYS ER 20 MEQ PO TBCR
40.0000 meq | EXTENDED_RELEASE_TABLET | Freq: Once | ORAL | Status: AC
  Administered 2023-08-11: 40 meq via ORAL
  Filled 2023-08-10: qty 2

## 2023-08-10 MED ORDER — LACTATED RINGERS IV BOLUS (SEPSIS)
800.0000 mL | Freq: Once | INTRAVENOUS | Status: AC
  Administered 2023-08-10: 800 mL via INTRAVENOUS

## 2023-08-10 MED ORDER — SODIUM CHLORIDE 0.9 % IV SOLN
500.0000 mg | Freq: Once | INTRAVENOUS | Status: AC
  Administered 2023-08-10: 500 mg via INTRAVENOUS
  Filled 2023-08-10: qty 5

## 2023-08-10 MED ORDER — LACTATED RINGERS IV BOLUS (SEPSIS)
1000.0000 mL | Freq: Once | INTRAVENOUS | Status: AC
  Administered 2023-08-10: 1000 mL via INTRAVENOUS

## 2023-08-10 MED ORDER — IPRATROPIUM-ALBUTEROL 0.5-2.5 (3) MG/3ML IN SOLN
3.0000 mL | Freq: Once | RESPIRATORY_TRACT | Status: AC
  Administered 2023-08-10: 3 mL via RESPIRATORY_TRACT
  Filled 2023-08-10: qty 3

## 2023-08-10 NOTE — ED Provider Notes (Signed)
 [place] EMERGENCY DEPARTMENT AT [facility] Provider Note   CSN: [id number] Arrival date & time: 08/10/23  1921     History  Chief Complaint  Patient presents with   Shortness of Breath    Janice Taylor is a 33 y.o. female.  33 year old female with past medical history of diabetes and hypertension presenting to the emergency department today with shortness of breath and hypoxia.  The patient was evaluated last night and was diagnosed with pneumonia.  Initially the plan was for admission as the patient was hypoxic.  She apparently had improvement in her pulse ox to 94% and went home.  She states that very minimal exertion while she was taking a shower today she can became very short of breath.  She states that she is coughing up a lot of mucus with this.  She checked her oxygen saturations at the time they were 80% on room air.  She came to the ER today for further evaluation regarding this.   Shortness of Breath      Home Medications Prior to Admission medications   Medication Sig Start Date End Date Taking? Authorizing Provider  albuterol (VENTOLIN HFA) 108 (90 Base) MCG/ACT inhaler Inhale 2 puffs into the lungs every 6 (six) hours as needed for wheezing or shortness of breath. 01/28/22   Leath-Warren, Sadie Haber, NP  ALPRAZolam Prudy Feeler) 0.5 MG tablet Take 1 tablet (0.5 mg total) by mouth in the morning, then 1 tablet (0.5 mg total) in the afternoon, and then 0.5 tablet (0.25 mg total) in the evening. 09/23/22     amitriptyline (ELAVIL) 25 MG tablet Take by mouth. 09/30/21   [provider]  amLODipine (NORVASC) 10 MG tablet Take 10 mg by mouth daily. 02/14/20   [provider]  busPIRone (BUSPAR) 15 MG tablet Take 15 mg by mouth 3 (three) times daily.    [provider]  cetirizine (ZYRTEC ALLERGY) 10 MG tablet Take 1 tablet (10 mg total) by mouth daily. 01/19/22   Wallis Bamberg, PA-C  cyclobenzaprine (FLEXERIL) 10 MG tablet Take 1 tablet (10 mg  total) by mouth every 8 (eight) hours as needed for muscle spasms. 01/05/23   Myna Hidalgo, DO  dicyclomine (BENTYL) 20 MG tablet Take 1 tablet (20 mg total) by mouth 2 (two) times daily. 03/12/23   Lunette Stands, PA-C  doxycycline (VIBRAMYCIN) 100 MG capsule Take 1 capsule (100 mg total) by mouth 2 (two) times daily. 04/22/23   Particia Nearing, PA-C  DULoxetine (CYMBALTA) 60 MG capsule Take 60 mg by mouth daily.    [provider]  empagliflozin (JARDIANCE) 25 MG TABS tablet Take by mouth daily.    [provider]  fluconazole (DIFLUCAN) 150 MG tablet Take 1 tablet today and repeat in 3 days 03/08/23   Lazaro Arms, MD  hydrochlorothiazide (HYDRODIURIL) 25 MG tablet Take 25 mg by mouth daily.    [provider]  ketorolac (TORADOL) 10 MG tablet Take 1 tablet (10 mg total) by mouth every 8 (eight) hours as needed. 02/03/23   Myna Hidalgo, DO  lamoTRIgine (LAMICTAL) 100 MG tablet Take 100 mg by mouth daily.    [provider]  megestrol (MEGACE) 40 MG tablet Take 2 tablets (80 mg total) by mouth daily. 01/11/23   Lazaro Arms, MD  metFORMIN (GLUCOPHAGE) 500 MG tablet Take by mouth 2 (two) times daily with a meal.    [provider]  metoCLOPramide (REGLAN) 5 MG tablet Take 1  tablet (5 mg total) by mouth every 8 (eight) hours as needed for up to 5 days for nausea. 02/02/23 02/07/23  Myna Hidalgo, DO  oxyCODONE-acetaminophen (PERCOCET) 5-325 MG tablet Take 1 tablet by mouth every 6 (six) hours as needed for severe pain. 01/14/23   Lazaro Arms, MD  predniSONE (DELTASONE) 20 MG tablet Take 2 tablets (40 mg total) by mouth daily with breakfast. 04/22/23   Particia Nearing, PA-C  promethazine (PHENERGAN) 12.5 MG tablet Take 1 tablet (12.5 mg total) by mouth every 6 (six) hours as needed for nausea or vomiting. 03/12/23   Lunette Stands, PA-C  promethazine-dextromethorphan (PROMETHAZINE-DM) 6.25-15 MG/5ML syrup Take 5 mLs by mouth 4 (four)  times daily as needed. 04/22/23   Particia Nearing, PA-C  QUEtiapine (SEROQUEL) 50 MG tablet Take 50 mg by mouth at bedtime.    [provider]      Allergies    Ondansetron hcl and Penicillins    Review of Systems   Review of Systems  Constitutional:  Positive for chills.  Respiratory:  Positive for shortness of breath.   All other systems reviewed and are negative.   Physical Exam Updated Vital Signs BP 111/78 (BP Location: Right Wrist)   Pulse (!) 115   Temp 98.5 F (36.9 C) (Oral)   Resp 19   Ht 5\' 4"  (1.626 m)   Wt (!) 158.8 kg   LMP 07/25/2023 (Exact Date)   SpO2 95%   BMI 60.08 kg/m  Physical Exam Vitals and nursing note reviewed.   Gen: Mild conversational dyspnea noted Eyes: PERRL, EOMI HEENT: no oropharyngeal swelling Neck: trachea midline Resp: Diminished breath sounds in right lower lung fields Card: RRR, no murmurs, rubs, or gallops Abd: nontender, nondistended Extremities: no calf tenderness, no edema Vascular: 2+ radial pulses bilaterally, 2+ DP pulses bilaterally Skin: no rashes Psyc: acting appropriately   ED Results / Procedures / Treatments   Labs (all labs ordered are listed, but only abnormal results are displayed) Labs Reviewed  COMPREHENSIVE METABOLIC PANEL WITH GFR - Abnormal; Notable for the following components:      Result Value   Potassium 3.4 (*)    Glucose, Bld 104 (*)    Creatinine, Ser 1.13 (*)    Calcium 8.0 (*)    Total Protein 5.4 (*)    Albumin 3.1 (*)    All other components within normal limits  CBC WITH DIFFERENTIAL/PLATELET - Abnormal; Notable for the following components:   Hemoglobin 11.6 (*)    MCH 25.1 (*)    RDW 16.0 (*)    All other components within normal limits  URINALYSIS, W/ REFLEX TO CULTURE (INFECTION SUSPECTED) - Abnormal; Notable for the following components:   Glucose, UA >=500 (*)    All other components within normal limits  RESP PANEL BY RT-PCR (RSV, FLU A&B, COVID)  RVPGX2   CULTURE, BLOOD (ROUTINE X 2)  CULTURE, BLOOD (ROUTINE X 2)  PROTIME-INR  HCG, SERUM, QUALITATIVE  I-STAT CG4 LACTIC ACID, ED    EKG None  Radiology DG Chest Port 1 View Result Date: 08/10/2023 CLINICAL DATA:  Questionable sepsis - evaluate for abnormality EXAM: PORTABLE CHEST - 1 VIEW COMPARISON:  07/29/2023. FINDINGS: Cardiac silhouette unremarkable. Mild pulmonary vascular congestion. Left base alveolar consolidation may represent pneumonia. No pneumothorax or pleural effusion. IMPRESSION: Left base consolidation. Electronically Signed   By: Layla Maw M.D.   On: 08/10/2023 22:01    Procedures Procedures    Medications Ordered in ED Medications  lactated ringers infusion ( Intravenous New Bag/Given 08/10/23 2207)  cefTRIAXone (ROCEPHIN) 2 g in sodium chloride 0.9 % 100 mL IVPB (0 g Intravenous Stopped 08/10/23 2105)  azithromycin (ZITHROMAX) 500 mg in sodium chloride 0.9 % 250 mL IVPB (0 mg Intravenous Stopped 08/10/23 2202)  lactated ringers bolus 1,000 mL (0 mLs Intravenous Stopped 08/10/23 2110)    And  lactated ringers bolus 800 mL (800 mLs Intravenous New Bag/Given 08/10/23 2207)  benzonatate (TESSALON) capsule 200 mg (200 mg Oral Given 08/10/23 2040)  ipratropium-albuterol (DUONEB) 0.5-2.5 (3) MG/3ML nebulizer solution 3 mL (3 mLs Nebulization Given 08/10/23 2042)  promethazine (PHENERGAN) 12.5 mg in sodium chloride 0.9 % 50 mL IVPB (12.5 mg Intravenous New Bag/Given 08/10/23 2207)    ED Course/ Medical Decision Making/ A&P                                 Medical Decision Making 33 year old female with past medical history of diabetes and hypertension presenting to the emergency department today with cough and shortness of breath after being diagnosed with pneumonia yesterday as an outpatient.  The patient is requiring oxygen now.  I will further evaluate her here with a sepsis workup.  Will cover her empirically with Rocephin and azithromycin.  I will give the patient a  DuoNeb here as well as Tessalon for cough.  She will require admission.  The patient's labs are largely unremarkable.  She did remain on oxygen.  Her x-ray does show signs concerning for pneumonia.  Calls placed to hospitalist service for admission.  Risk Prescription drug management. Decision regarding hospitalization.           Final Clinical Impression(s) / ED Diagnoses Final diagnoses:  Pneumonia due to infectious organism, unspecified laterality, unspecified part of lung  Hypoxia    Rx / DC Orders ED Discharge Orders     None         Carin Charleston, MD 08/10/23 2256

## 2023-08-10 NOTE — Sepsis Progress Note (Signed)
 Elink monitoring for the code sepsis protocol.

## 2023-08-10 NOTE — H&P (Signed)
 History and Physical      Janice Taylor VQQ:[id number] DOB: [birth year] DOA: 08/10/2023; DOS: 08/10/2023  PCP: Samuella Bruin *** Patient coming from: home ***  I have personally briefly reviewed patient's old medical records in [facility]  Chief Complaint: ***  HPI: Janice Taylor is a 33 y.o. female with medical history significant for *** who is admitted to [facility] on 08/10/2023 with *** after presenting from home*** to [facility] ED complaining of ***.    ***       ***   ED Course:  Vital signs in the ED were notable for the following: ***  Labs were notable for the following: ***  Per my interpretation, EKG in ED demonstrated the following:  ***  Imaging in the ED, per corresponding formal radiology read, was notable for the following:  ***  While in the ED, the following were administered: ***  Subsequently, the patient was admitted  ***  ***red    Review of Systems: As per HPI otherwise 10 point review of systems negative.   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 She has never used smokeless tobacco. She reports that she does not drink alcohol and does not use drugs.   Allergies  Allergen Reactions   Ondansetron Hcl  Hives   Penicillins Rash    Pt tolerates ceftriaxone well    Family History  Problem Relation Age of Onset   Diabetes Maternal Uncle    Kidney disease Maternal Uncle        renal failure/dialysis   Heart attack Maternal Grandmother     Family history reviewed and not pertinent ***   Prior to Admission medications   Medication Sig Start Date End Date Taking? Authorizing Provider  albuterol (VENTOLIN HFA) 108 (90 Base) MCG/ACT inhaler Inhale 2 puffs into the lungs every 6 (six) hours as needed for wheezing or shortness of breath. 01/28/22   Leath-Warren, Sadie Haber, NP  ALPRAZolam Prudy Feeler) 0.5 MG tablet Take 1 tablet (0.5 mg total) by mouth in the morning, then 1 tablet (0.5 mg total) in the afternoon, and then 0.5 tablet (0.25 mg total) in the evening. 09/23/22     amitriptyline (ELAVIL) 25 MG tablet Take by mouth. 09/30/21   [provider]  amLODipine (NORVASC) 10 MG tablet Take 10 mg by mouth daily. 02/14/20   [provider]  busPIRone (BUSPAR) 15 MG tablet Take 15 mg by mouth 3 (three) times daily.    [provider]  cetirizine (ZYRTEC ALLERGY) 10 MG tablet Take 1 tablet (10 mg total) by mouth daily. 01/19/22   Wallis Bamberg, PA-C  cyclobenzaprine (FLEXERIL) 10 MG tablet Take 1  tablet (10 mg total) by mouth every 8 (eight) hours as needed for muscle spasms. 01/05/23   Ozan, Jennifer, DO  dicyclomine (BENTYL) 20 MG tablet Take 1 tablet (20 mg total) by mouth 2 (two) times daily. 03/12/23   Hayes Lipps, PA-C  doxycycline (VIBRAMYCIN) 100 MG capsule Take 1 capsule (100 mg total) by mouth 2 (two) times daily. 04/22/23   Corbin Dess, PA-C  DULoxetine (CYMBALTA) 60 MG capsule Take 60 mg by mouth daily.    [provider]  empagliflozin (JARDIANCE) 25 MG TABS tablet Take by mouth daily.    [provider]  fluconazole (DIFLUCAN) 150 MG tablet Take 1 tablet today and repeat in 3 days 03/08/23   Wendelyn Halter, MD  hydrochlorothiazide  (HYDRODIURIL) 25 MG tablet Take 25 mg by mouth daily.    [provider]  ketorolac (TORADOL) 10 MG tablet Take 1 tablet (10 mg total) by mouth every 8 (eight) hours as needed. 02/03/23   Ozan, Jennifer, DO  lamoTRIgine (LAMICTAL) 100 MG tablet Take 100 mg by mouth daily.    [provider]  megestrol (MEGACE) 40 MG tablet Take 2 tablets (80 mg total) by mouth daily. 01/11/23   Wendelyn Halter, MD  metFORMIN (GLUCOPHAGE) 500 MG tablet Take by mouth 2 (two) times daily with a meal.    [provider]  metoCLOPramide (REGLAN) 5 MG tablet Take 1 tablet (5 mg total) by mouth every 8 (eight) hours as needed for up to 5 days for nausea. 02/02/23 02/07/23  Ozan, Jennifer, DO  oxyCODONE-acetaminophen (PERCOCET) 5-325 MG tablet Take 1 tablet by mouth every 6 (six) hours as needed for severe pain. 01/14/23   Wendelyn Halter, MD  predniSONE (DELTASONE) 20 MG tablet Take 2 tablets (40 mg total) by mouth daily with breakfast. 04/22/23   Corbin Dess, PA-C  promethazine (PHENERGAN) 12.5 MG tablet Take 1 tablet (12.5 mg total) by mouth every 6 (six) hours as needed for nausea or vomiting. 03/12/23   Bauer, Collin S, PA-C  promethazine-dextromethorphan (PROMETHAZINE-DM) 6.25-15 MG/5ML syrup Take 5 mLs by mouth 4 (four) times daily as needed. 04/22/23   Corbin Dess, PA-C  QUEtiapine (SEROQUEL) 50 MG tablet Take 50 mg by mouth at bedtime.    [provider]     Objective    Physical Exam: Vitals:   08/10/23 1945 08/10/23 1947 08/10/23 2130 08/10/23 2328  BP:  111/78 132/66   Pulse:  (!) 115 (!) 103   Resp:  19 (!) 25   Temp:  98.5 F (36.9 C)  98.4 F (36.9 C)  TempSrc:  Oral  Oral  SpO2:  95% 96%   Weight: (!) 158.8 kg     Height: 5\' 4"  (1.626 m)       General: appears to be stated age; alert, oriented Skin: warm, dry, no rash Head:  AT/[place] Mouth:  Oral mucosa membranes appear moist, normal dentition Neck: supple; trachea midline Heart:  RRR; did  not appreciate any M/R/G Lungs: CTAB, did not appreciate any wheezes, rales, or rhonchi Abdomen: + BS; soft, ND, NT Vascular: 2+ pedal pulses b/l; 2+ radial pulses b/l Extremities: no peripheral edema, no muscle wasting Neuro: strength and sensation intact in upper and lower extremities b/l ***   *** Neuro: 5/5 strength of the proximal and distal flexors and extensors of the upper and lower extremities bilaterally; sensation intact in upper and lower extremities b/l; cranial nerves II through XII grossly intact; no pronator drift;  no evidence suggestive of slurred speech, dysarthria, or facial droop; Normal muscle tone. No tremors.  *** Neuro: In the setting of the patient's current mental status and associated inability to follow instructions, unable to perform full neurologic exam at this time.  As such, assessment of strength, sensation, and cranial nerves is limited at this time. Patient noted to spontaneously move all 4 extremities. No tremors.  ***    Labs on Admission: I have personally reviewed following labs and imaging studies  CBC: Recent Labs  Lab 08/10/23 1947  WBC 5.6  NEUTROABS 4.3  HGB 11.6*  HCT 37.3  MCV 80.7  PLT 217   Basic Metabolic Panel: Recent Labs  Lab 08/10/23 1947  NA 136  K 3.4*  CL 101  CO2 24  GLUCOSE 104*  BUN 9  CREATININE 1.13*  CALCIUM 8.0*   GFR: Estimated Creatinine Clearance: 108.7 mL/min (A) (by C-G formula based on SCr of 1.13 mg/dL (H)). Liver Function Tests: Recent Labs  Lab 08/10/23 1947  AST 22  ALT 17  ALKPHOS 85  BILITOT 0.5  PROT 5.4*  ALBUMIN 3.1*   No results for input(s): "LIPASE", "AMYLASE" in the last 168 hours. No results for input(s): "AMMONIA" in the last 168 hours. Coagulation Profile: Recent Labs  Lab 08/10/23 1947  INR 1.1   Cardiac Enzymes: No results for input(s): "CKTOTAL", "CKMB", "CKMBINDEX", "TROPONINI" in the last 168 hours. BNP (last 3 results) No results for input(s): "PROBNP" in the  last 8760 hours. HbA1C: No results for input(s): "HGBA1C" in the last 72 hours. CBG: No results for input(s): "GLUCAP" in the last 168 hours. Lipid Profile: No results for input(s): "CHOL", "HDL", "LDLCALC", "TRIG", "CHOLHDL", "LDLDIRECT" in the last 72 hours. Thyroid Function Tests: No results for input(s): "TSH", "T4TOTAL", "FREET4", "T3FREE", "THYROIDAB" in the last 72 hours. Anemia Panel: No results for input(s): "VITAMINB12", "FOLATE", "FERRITIN", "TIBC", "IRON", "RETICCTPCT" in the last 72 hours. Urine analysis:    Component Value Date/Time   COLORURINE YELLOW 08/10/2023 2211   APPEARANCEUR CLEAR 08/10/2023 2211   APPEARANCEUR Clear 10/21/2021 1613   LABSPEC 1.016 08/10/2023 2211   PHURINE 5.0 08/10/2023 2211   GLUCOSEU >=500 (A) 08/10/2023 2211   HGBUR NEGATIVE 08/10/2023 2211   BILIRUBINUR NEGATIVE 08/10/2023 2211   BILIRUBINUR negative 11/17/2021 1538   BILIRUBINUR Negative 10/21/2021 1613   KETONESUR NEGATIVE 08/10/2023 2211   PROTEINUR NEGATIVE 08/10/2023 2211   UROBILINOGEN 0.2 11/17/2021 1538   UROBILINOGEN 0.2 01/25/2015 1103   NITRITE NEGATIVE 08/10/2023 2211   LEUKOCYTESUR NEGATIVE 08/10/2023 2211    Radiological Exams on Admission: DG Chest Port 1 View Result Date: 08/10/2023 CLINICAL DATA:  Questionable sepsis - evaluate for abnormality EXAM: PORTABLE CHEST - 1 VIEW COMPARISON:  07/29/2023. FINDINGS: Cardiac silhouette unremarkable. Mild pulmonary vascular congestion. Left base alveolar consolidation may represent pneumonia. No pneumothorax or pleural effusion. IMPRESSION: Left base consolidation. Electronically Signed   By: Layla Maw M.D.   On: 08/10/2023 22:01      Assessment/Plan   Principal Problem:   CAP (community acquired  pneumonia)   ***            ***                  ***                   ***                  ***                  ***                  ***                   ***                  ***                  ***                  ***                  ***                 ***                ***  DVT prophylaxis: SCD's ***  Code Status: Full code*** Family Communication: none*** Disposition Plan: Per Rounding Team Consults called: none***;  Admission status: ***     I SPENT GREATER THAN 75 *** MINUTES IN CLINICAL CARE TIME/MEDICAL DECISION-MAKING IN COMPLETING THIS ADMISSION.      Gattis Kass Aleksey Newbern DO [facility]  From 7PM - 7AM   08/10/2023, 11:31 PM   ***

## 2023-08-10 NOTE — ED Triage Notes (Signed)
  Patient comes in with 3 days of SOB and cough.  Patient was just seen at [facility] and diagnosed with pneumonia.  Patient states they wanted to admit her but left AMA.  Went home and tried to handle it but became SOB.  Pain 7/10, sharp.

## 2023-08-10 NOTE — ED Notes (Signed)
 Pt walked to bathroom and became very SOB/ 87% O2/ pt placed back on [place]

## 2023-08-11 ENCOUNTER — Encounter (HOSPITAL_COMMUNITY): Payer: Self-pay | Admitting: Internal Medicine

## 2023-08-11 DIAGNOSIS — J9601 Acute respiratory failure with hypoxia: Secondary | ICD-10-CM | POA: Diagnosis present

## 2023-08-11 DIAGNOSIS — E876 Hypokalemia: Secondary | ICD-10-CM | POA: Diagnosis not present

## 2023-08-11 DIAGNOSIS — J309 Allergic rhinitis, unspecified: Secondary | ICD-10-CM | POA: Diagnosis present

## 2023-08-11 DIAGNOSIS — F32A Depression, unspecified: Secondary | ICD-10-CM | POA: Diagnosis present

## 2023-08-11 DIAGNOSIS — J189 Pneumonia, unspecified organism: Secondary | ICD-10-CM | POA: Diagnosis not present

## 2023-08-11 DIAGNOSIS — E119 Type 2 diabetes mellitus without complications: Secondary | ICD-10-CM

## 2023-08-11 DIAGNOSIS — R0602 Shortness of breath: Secondary | ICD-10-CM | POA: Diagnosis present

## 2023-08-11 LAB — MAGNESIUM
Magnesium: 1.6 mg/dL — ABNORMAL LOW (ref 1.7–2.4)
Magnesium: 2.3 mg/dL (ref 1.7–2.4)

## 2023-08-11 LAB — CBC WITH DIFFERENTIAL/PLATELET
Abs Immature Granulocytes: 0.02 10*3/uL (ref 0.00–0.07)
Basophils Absolute: 0 10*3/uL (ref 0.0–0.1)
Basophils Relative: 0 %
Eosinophils Absolute: 0 10*3/uL (ref 0.0–0.5)
Eosinophils Relative: 1 %
HCT: 36.3 % (ref 36.0–46.0)
Hemoglobin: 11.5 g/dL — ABNORMAL LOW (ref 12.0–15.0)
Immature Granulocytes: 0 %
Lymphocytes Relative: 17 %
Lymphs Abs: 0.9 10*3/uL (ref 0.7–4.0)
MCH: 25.3 pg — ABNORMAL LOW (ref 26.0–34.0)
MCHC: 31.7 g/dL (ref 30.0–36.0)
MCV: 79.8 fL — ABNORMAL LOW (ref 80.0–100.0)
Monocytes Absolute: 0.5 10*3/uL (ref 0.1–1.0)
Monocytes Relative: 9 %
Neutro Abs: 3.8 10*3/uL (ref 1.7–7.7)
Neutrophils Relative %: 73 %
Platelets: 207 10*3/uL (ref 150–400)
RBC: 4.55 MIL/uL (ref 3.87–5.11)
RDW: 16 % — ABNORMAL HIGH (ref 11.5–15.5)
WBC: 5.2 10*3/uL (ref 4.0–10.5)
nRBC: 0 % (ref 0.0–0.2)

## 2023-08-11 LAB — I-STAT VENOUS BLOOD GAS, ED
Acid-Base Excess: 1 mmol/L (ref 0.0–2.0)
Bicarbonate: 25.6 mmol/L (ref 20.0–28.0)
Calcium, Ion: 1.01 mmol/L — ABNORMAL LOW (ref 1.15–1.40)
HCT: 34 % — ABNORMAL LOW (ref 36.0–46.0)
Hemoglobin: 11.6 g/dL — ABNORMAL LOW (ref 12.0–15.0)
O2 Saturation: 95 %
Potassium: 3.2 mmol/L — ABNORMAL LOW (ref 3.5–5.1)
Sodium: 137 mmol/L (ref 135–145)
TCO2: 27 mmol/L (ref 22–32)
pCO2, Ven: 42 mmHg — ABNORMAL LOW (ref 44–60)
pH, Ven: 7.393 (ref 7.25–7.43)
pO2, Ven: 79 mmHg — ABNORMAL HIGH (ref 32–45)

## 2023-08-11 LAB — COMPREHENSIVE METABOLIC PANEL WITH GFR
ALT: 15 U/L (ref 0–44)
AST: 22 U/L (ref 15–41)
Albumin: 3 g/dL — ABNORMAL LOW (ref 3.5–5.0)
Alkaline Phosphatase: 82 U/L (ref 38–126)
Anion gap: 10 (ref 5–15)
BUN: 8 mg/dL (ref 6–20)
CO2: 26 mmol/L (ref 22–32)
Calcium: 7.9 mg/dL — ABNORMAL LOW (ref 8.9–10.3)
Chloride: 101 mmol/L (ref 98–111)
Creatinine, Ser: 1.17 mg/dL — ABNORMAL HIGH (ref 0.44–1.00)
GFR, Estimated: >60 mL/min (ref >60)
Glucose, Bld: 103 mg/dL — ABNORMAL HIGH (ref 70–99)
Potassium: 3.3 mmol/L — ABNORMAL LOW (ref 3.5–5.1)
Sodium: 137 mmol/L (ref 135–145)
Total Bilirubin: 0.2 mg/dL (ref 0.0–1.2)
Total Protein: 5.2 g/dL — ABNORMAL LOW (ref 6.5–8.1)

## 2023-08-11 LAB — GLUCOSE, CAPILLARY
Glucose-Capillary: 107 mg/dL — ABNORMAL HIGH (ref 70–99)
Glucose-Capillary: 124 mg/dL — ABNORMAL HIGH (ref 70–99)

## 2023-08-11 LAB — PHOSPHORUS: Phosphorus: 2.2 mg/dL — ABNORMAL LOW (ref 2.5–4.6)

## 2023-08-11 LAB — STREP PNEUMONIAE URINARY ANTIGEN: Strep Pneumo Urinary Antigen: NEGATIVE

## 2023-08-11 LAB — PROCALCITONIN: Procalcitonin: 0.1 ng/mL

## 2023-08-11 LAB — HEMOGLOBIN A1C
Hgb A1c MFr Bld: 5 % (ref 4.8–5.6)
Mean Plasma Glucose: 96.8 mg/dL

## 2023-08-11 LAB — CBG MONITORING, ED
Glucose-Capillary: 97 mg/dL (ref 70–99)
Glucose-Capillary: 87 mg/dL (ref 70–99)

## 2023-08-11 LAB — BRAIN NATRIURETIC PEPTIDE: B Natriuretic Peptide: 12.6 pg/mL (ref 0.0–100.0)

## 2023-08-11 MED ORDER — POTASSIUM PHOSPHATES 15 MMOLE/5ML IV SOLN
30.0000 mmol | Freq: Once | INTRAVENOUS | Status: AC
  Administered 2023-08-11: 30 mmol via INTRAVENOUS
  Filled 2023-08-11: qty 10

## 2023-08-11 MED ORDER — BENZONATATE 100 MG PO CAPS
200.0000 mg | ORAL_CAPSULE | Freq: Three times a day (TID) | ORAL | Status: DC | PRN
  Administered 2023-08-11: 200 mg via ORAL
  Filled 2023-08-11: qty 2

## 2023-08-11 MED ORDER — CETIRIZINE HCL 10 MG PO TABS
10.0000 mg | ORAL_TABLET | Freq: Every day | ORAL | Status: DC
  Administered 2023-08-12 – 2023-08-14 (×3): 10 mg via ORAL
  Filled 2023-08-11 (×4): qty 1

## 2023-08-11 MED ORDER — DULOXETINE HCL 60 MG PO CPEP
60.0000 mg | ORAL_CAPSULE | Freq: Every day | ORAL | Status: DC
  Administered 2023-08-11 – 2023-08-14 (×4): 60 mg via ORAL
  Filled 2023-08-11: qty 1
  Filled 2023-08-11: qty 2
  Filled 2023-08-11 (×2): qty 1

## 2023-08-11 MED ORDER — MENTHOL 3 MG MT LOZG
1.0000 | LOZENGE | OROMUCOSAL | Status: DC | PRN
  Filled 2023-08-11: qty 9

## 2023-08-11 MED ORDER — QUETIAPINE FUMARATE 100 MG PO TABS
150.0000 mg | ORAL_TABLET | Freq: Every day | ORAL | Status: DC
  Administered 2023-08-11 – 2023-08-13 (×3): 150 mg via ORAL
  Filled 2023-08-11: qty 3
  Filled 2023-08-11: qty 1.5
  Filled 2023-08-11: qty 3
  Filled 2023-08-11 (×3): qty 1.5
  Filled 2023-08-11: qty 3

## 2023-08-11 MED ORDER — ALPRAZOLAM 0.25 MG PO TABS
0.2500 mg | ORAL_TABLET | Freq: Once | ORAL | Status: AC
  Administered 2023-08-11: 0.25 mg via ORAL
  Filled 2023-08-11: qty 1

## 2023-08-11 MED ORDER — FUROSEMIDE 10 MG/ML IJ SOLN
40.0000 mg | Freq: Once | INTRAMUSCULAR | Status: AC
  Administered 2023-08-11: 40 mg via INTRAVENOUS
  Filled 2023-08-11: qty 4

## 2023-08-11 MED ORDER — HYDROCOD POLI-CHLORPHE POLI ER 10-8 MG/5ML PO SUER
5.0000 mL | Freq: Two times a day (BID) | ORAL | Status: AC
  Administered 2023-08-11: 5 mL via ORAL
  Filled 2023-08-11: qty 5

## 2023-08-11 MED ORDER — LAMOTRIGINE 100 MG PO TABS
200.0000 mg | ORAL_TABLET | Freq: Every day | ORAL | Status: DC
  Administered 2023-08-11 – 2023-08-14 (×4): 200 mg via ORAL
  Filled 2023-08-11 (×3): qty 2
  Filled 2023-08-11: qty 8
  Filled 2023-08-11: qty 2

## 2023-08-11 MED ORDER — POTASSIUM CHLORIDE CRYS ER 20 MEQ PO TBCR
40.0000 meq | EXTENDED_RELEASE_TABLET | Freq: Once | ORAL | Status: AC
  Administered 2023-08-11: 40 meq via ORAL
  Filled 2023-08-11: qty 2

## 2023-08-11 MED ORDER — DM-GUAIFENESIN ER 30-600 MG PO TB12
1.0000 | ORAL_TABLET | Freq: Two times a day (BID) | ORAL | Status: DC
  Administered 2023-08-11 – 2023-08-14 (×6): 1 via ORAL
  Filled 2023-08-11 (×7): qty 1

## 2023-08-11 MED ORDER — ALPRAZOLAM 0.25 MG PO TABS: 0.2500 mg | ORAL_TABLET | Freq: Three times a day (TID) | ORAL | Status: DC | PRN

## 2023-08-11 MED ORDER — MAGNESIUM SULFATE 2 GM/50ML IV SOLN
2.0000 g | Freq: Once | INTRAVENOUS | Status: AC
  Administered 2023-08-11: 2 g via INTRAVENOUS
  Filled 2023-08-11: qty 50

## 2023-08-11 MED ORDER — ALPRAZOLAM 0.5 MG PO TABS
0.5000 mg | ORAL_TABLET | Freq: Three times a day (TID) | ORAL | Status: DC
  Administered 2023-08-11 – 2023-08-14 (×10): 0.5 mg via ORAL
  Filled 2023-08-11: qty 1
  Filled 2023-08-11 (×2): qty 2
  Filled 2023-08-11 (×4): qty 1
  Filled 2023-08-11: qty 2
  Filled 2023-08-11 (×2): qty 1

## 2023-08-11 MED ORDER — BUSPIRONE HCL 5 MG PO TABS
15.0000 mg | ORAL_TABLET | Freq: Two times a day (BID) | ORAL | Status: DC
  Administered 2023-08-11 – 2023-08-14 (×7): 15 mg via ORAL
  Filled 2023-08-11 (×2): qty 3
  Filled 2023-08-11: qty 2
  Filled 2023-08-11 (×4): qty 3

## 2023-08-11 NOTE — ED Notes (Signed)
 Md made aware of o2 sats./ pt is very anxious and SHOB is getting worse

## 2023-08-11 NOTE — ED Notes (Signed)
 Going upstairs, report called. Verbalizes: "wants something stronger for cough, so she doesn't hurt, so she can sleep, it didn't work last night".

## 2023-08-11 NOTE — Progress Notes (Signed)
 PROGRESS NOTE                                                                                                                                                                                                             Patient Demographics:    Janice Taylor, is a 33 y.o. 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    The patient reports 3 to 4 days of progressive shortness of breath associated with new onset cough, mostly nonproductive.  She also notes associated subjective fever, chills, in the absence of rigors or generalized myalgias.  She conveys that her shortness of breath has not been associate with any orthopnea, PND, or worsening peripheral edema.  Denies any associated chest pain, palpitations, diaphoresis.  No wheezing or hemoptysis.   Denies any known chronic underlying pulmonary pathology, including no known history of underlying asthma.  She also denies any known history of chronic baseline supplemental oxygen requirements.   She had presented to an emergency department at an outside hospital on 08/09/2023, at which time she was diagnosed with community-acquired pneumonia.  She was discharged home from the emergency department with prescription for Levaquin.  She has taken 1 interval dose of Levaquin, but in the setting of further progression in her shortness of  breath, she presents to [facility] emergency department this evening for further evaluation management thereof.   Subjective:    Janice Taylor was evaluated at the bed side.  Patient is anxious and concerned about recurrent from her pneumonia.  She continues to have congested cough and reports some side pain from the constant coughing.  She continues to have some shortness of breath but denies any fevers, chills, nausea or vomiting.  Reports that she does snore at night and  feels fatigue throughout the day even after a long night of sleep.   Assessment  & Plan :    Assessment and Plan:  # Community-acquired pneumonia Presented with clinical and radiological evidence of pneumonia.  She continues to have a congested cough and has new O2 requirement of up to 8 L HFNC.  Procalcitonin of 0.10. -Continue IV Rocephin and azithromycin - Follow-up sputum culture and mycoplasma antibody - Trend CBC, fever curve - Start Mucinex DM twice daily and continue as needed Tessalon Perles - Incentive spirometer flatter valve  # Acute hypoxic respiratory failure In the setting of pneumonia.  Increased O2 requirements up to 8 L HFNC.  Currently weaned down to 5 L [place]. - Continue supplemental O2 -Continue as needed nebulized solution - Ambulate with O2 tomorrow morning - Incentive spirometer, flutter valve   # Hypokalemia K+ of 3.3.  Repleted with oral KCl.  Mag improved to 2.3 after repletion yesterday. - Follow-up monitor BMP  # Type 2 diabetes Significantly improved with A1c down from 7.0% 2 years ago to 5.0% on admission.  CBG stable in the 80s to 100s. -Continue SSI, sensitive -Continue CBG monitoring  # Anxiety and depression Reports that she usually takes her Xanax 3 times daily but is currently as needed here. -Continue amitriptyline, BuSpar, Cymbalta, and Lamictal  -Schedule home Xanax 0.5 mg 3 times daily  # HTN BP currently stable with SBP in the 110s to 130s. -Continue to hold home BP  meds  # Undiagnosed OSA Patient with likely undiagnosed OSA with a STOP-BANG score of 5 indicating high risk of moderate to severe OSA. -Outpatient sleep study with PCP or pulmonology       Nutrition Problem:        Obesity: Estimated body mass index is 60.08 kg/m as calculated from the following:   Height as of this encounter: 5\' 4"  (1.626 m).   Weight as of this encounter: 158.8 kg.          Condition -stable  Family Communication  : Discussed plans with daughter at bedside  Code Status : Full  Consults  : None  PUD Prophylaxis : None   Procedures  :     None      Disposition Plan  :    Status is: Inpatient Remains inpatient appropriate because: Management of pneumonia and acute hypoxic respiratory failure  DVT Prophylaxis  :    SCDs Start: 08/10/23 2330     Lab Results  Component Value Date   PLT 207 08/11/2023    Diet :  Diet Order             Diet regular Room service appropriate? Yes; Fluid consistency: Thin  Diet effective now                    Inpatient Medications  Scheduled Meds:  ALPRAZolam  0.5 mg Oral TID   busPIRone  15 mg Oral BID   cetirizine  10 mg Oral Daily   dextromethorphan-guaiFENesin  1 tablet Oral BID   DULoxetine  60 mg Oral Daily   insulin aspart  0-9 Units Subcutaneous TID WC   lamoTRIgine  200 mg Oral Daily   QUEtiapine  150 mg Oral QHS   Continuous Infusions:  azithromycin Stopped (08/11/23 1647)   cefTRIAXone (ROCEPHIN)  IV 1 g (08/11/23 1822)   promethazine (PHENERGAN) injection (IM or IVPB)     PRN Meds:.acetaminophen **OR** acetaminophen, benzonatate, levalbuterol, melatonin, menthol-cetylpyridinium, promethazine (PHENERGAN) injection (IM or  IVPB)  Antibiotics  :    Anti-infectives (From admission, onward)    Start     Dose/Rate Route Frequency Ordered Stop   08/11/23 1600  azithromycin (ZITHROMAX) 500 mg in sodium chloride 0.9 % 250 mL IVPB        500 mg 250 mL/hr over 60 Minutes  Intravenous Every 24 hours 08/10/23 2333     08/11/23 1600  cefTRIAXone (ROCEPHIN) 1 g in sodium chloride 0.9 % 100 mL IVPB        1 g 200 mL/hr over 30 Minutes Intravenous Every 24 hours 08/10/23 2333     08/10/23 2000  cefTRIAXone (ROCEPHIN) 2 g in sodium chloride 0.9 % 100 mL IVPB        2 g 200 mL/hr over 30 Minutes Intravenous Once 08/10/23 1948 08/10/23 2105   08/10/23 2000  azithromycin (ZITHROMAX) 500 mg in sodium chloride 0.9 % 250 mL IVPB        500 mg 250 mL/hr over 60 Minutes Intravenous  Once 08/10/23 1948 08/10/23 2202         Objective:   Vitals:   08/11/23 1600 08/11/23 1615 08/11/23 1702 08/11/23 1956  BP: 135/76 130/72 127/66 117/65  Pulse: (!) 103 100 100 93  Resp: (!) 27 (!) 26 18 18   Temp:   98.9 F (37.2 C) 98.6 F (37 C)  TempSrc:    Oral  SpO2: (!) 89% 92% 97% 99%  Weight:      Height:        Wt Readings from Last 3 Encounters:  08/10/23 (!) 158.8 kg  07/29/23 (!) 158.8 kg  03/12/23 (!) 149.2 kg     Intake/Output Summary (Last 24 hours) at 08/11/2023 2044 Last data filed at 08/11/2023 0153 Gross per 24 hour  Intake 240 ml  Output 700 ml  Net -460 ml     Physical Exam  General: Pleasant, well-appearing obese middle-age woman laying in bed. No acute distress. HEENT: [place]/AT. Anicteric sclera CV: RRR. No murmurs, rubs, or gallops. No LE edema Pulmonary: On 8 L HFNC. Lungs CTAB. Normal effort. No wheezing or rales. Decreased air movement throughout. Abdominal: Soft, NT/ND. Normal bowel sounds. Extremities: Palpable radial and DP pulses. Normal ROM. Skin: Warm and dry. No obvious rash or lesions. Neuro: A&Ox3. Moves all extremities. Normal sensation to light touch. No focal deficit. Psych: Anxious mood    RN pressure injury documentation:      Data Review:    Recent Labs  Lab 08/10/23 1947 08/11/23 0445 08/11/23 0504  WBC 5.6 5.2  --   HGB 11.6* 11.5* 11.6*  HCT 37.3 36.3 34.0*  PLT 217 207  --   MCV 80.7 79.8*  --   MCH 25.1*  25.3*  --   MCHC 31.1 31.7  --   RDW 16.0* 16.0*  --   LYMPHSABS 0.9 0.9  --   MONOABS 0.4 0.5  --   EOSABS 0.1 0.0  --   BASOSABS 0.0 0.0  --     Recent Labs  Lab 08/10/23 1947 08/10/23 2014 08/10/23 2035 08/11/23 0445 08/11/23 0504  NA 136  --   --  137 137  K 3.4*  --   --  3.3* 3.2*  CL 101  --   --  101  --   CO2 24  --   --  26  --   ANIONGAP 11  --   --  10  --   GLUCOSE 104*  --   --  103*  --   BUN 9  --   --  8  --   CREATININE 1.13*  --   --  1.17*  --   AST 22  --   --  22  --   ALT 17  --   --  15  --   ALKPHOS 85  --   --  82  --   BILITOT 0.5  --   --  0.2  --   ALBUMIN 3.1*  --   --  3.0*  --   PROCALCITON  --   --   --  0.10  --   LATICACIDVEN  --   --  1.4  --   --   INR 1.1  --   --   --   --   HGBA1C  --  5.0  --   --   --   BNP  --  12.6  --   --   --   MG  --  1.6*  --  2.3  --   CALCIUM 8.0*  --   --  7.9*  --       Recent Labs  Lab 08/10/23 1947 08/10/23 2014 08/10/23 2035 08/11/23 0445  PROCALCITON  --   --   --  0.10  LATICACIDVEN  --   --  1.4  --   INR 1.1  --   --   --   HGBA1C  --  5.0  --   --   BNP  --  12.6  --   --   MG  --  1.6*  --  2.3  CALCIUM 8.0*  --   --  7.9*    --------------------------------------------------------------------------------------------------------------- Lab Results  Component Value Date   CHOL 194 11/20/2020   HDL 52 11/20/2020   LDLCALC 124 (H) 11/20/2020   TRIG 92 11/20/2020   CHOLHDL 3.7 11/20/2020    Lab Results  Component Value Date   HGBA1C 5.0 08/10/2023   No results for input(s): "TSH", "T4TOTAL", "FREET4", "T3FREE", "THYROIDAB" in the last 72 hours. No results for input(s): "VITAMINB12", "FOLATE", "FERRITIN", "TIBC", "IRON", "RETICCTPCT" in the last 72 hours. ------------------------------------------------------------------------------------------------------------------ Cardiac Enzymes No results for input(s): "CKMB", "TROPONINI", "MYOGLOBIN" in the last 168  hours.  Invalid input(s): "CK"  Micro Results Recent Results (from the past 240 hours)  Resp panel by RT-PCR (RSV, Flu A&B, Covid) Anterior Nasal Swab     Status: None   Collection Time: 08/10/23  7:47 PM   Specimen: Anterior Nasal Swab  Result Value Ref Range Status   SARS Coronavirus 2 by RT PCR NEGATIVE NEGATIVE Final   Influenza A by PCR NEGATIVE NEGATIVE Final   Influenza B by PCR NEGATIVE NEGATIVE Final    Comment: (NOTE) The Xpert Xpress SARS-CoV-2/FLU/RSV plus assay is intended as an aid in the diagnosis of influenza from Nasopharyngeal swab specimens and should not be used as a sole basis for treatment. Nasal washings and aspirates are unacceptable for Xpert Xpress SARS-CoV-2/FLU/RSV testing.  Fact Sheet for Patients: BloggerCourse.com  Fact Sheet for Healthcare Providers: SeriousBroker.it  This test is not yet approved or cleared by the [place] FDA and has been authorized for detection and/or diagnosis of SARS-CoV-2 by FDA under an Emergency Use Authorization (EUA). This EUA will remain in effect (meaning this test can be used) for the duration of the COVID-19 declaration under Section 564(b)(1) of the Act, 21 U.S.C. section 360bbb-3(b)(1), unless the authorization is terminated or revoked.  Resp Syncytial Virus by PCR NEGATIVE NEGATIVE Final    Comment: (NOTE) Fact Sheet for Patients: BloggerCourse.com  Fact Sheet for Healthcare Providers: SeriousBroker.it  This test is not yet approved or cleared by the [place]  FDA and has been authorized for detection and/or diagnosis of SARS-CoV-2 by FDA under an Emergency Use Authorization (EUA). This EUA will remain in effect (meaning this test can be used) for the duration of the COVID-19 declaration under Section 564(b)(1) of the Act, 21 U.S.C. section 360bbb-3(b)(1), unless the authorization is terminated  or revoked.  Performed at [facility], [address], [place], [place] [address]   Blood Culture (routine x 2)     Status: None (Preliminary result)   Collection Time: 08/10/23  7:52 PM   Specimen: BLOOD  Result Value Ref Range Status   Specimen Description BLOOD RIGHT ANTECUBITAL  Final   Special Requests   Final    BOTTLES DRAWN AEROBIC AND ANAEROBIC Blood Culture results may not be optimal due to an inadequate volume of blood received in culture bottles   Culture   Final    NO GROWTH < 12 HOURS Performed at [facility], [address] [place], [place] [address]    Report Status PENDING  Incomplete  Blood Culture (routine x 2)     Status: None (Preliminary result)   Collection Time: 08/10/23  8:15 PM   Specimen: BLOOD RIGHT ARM  Result Value Ref Range Status   Specimen Description BLOOD RIGHT ARM  Final   Special Requests   Final    BOTTLES DRAWN AEROBIC AND ANAEROBIC Blood Culture results may not be optimal due to an inadequate volume of blood received in culture bottles   Culture   Final    NO GROWTH < 12 HOURS Performed at [facility], [address], [place], [place] [address]    Report Status PENDING  Incomplete    Radiology Reports DG Chest Port 1 View Result Date: 08/10/2023 CLINICAL DATA:  Questionable sepsis - evaluate for abnormality EXAM: PORTABLE CHEST - 1 VIEW COMPARISON:  07/29/2023. FINDINGS: Cardiac silhouette unremarkable. Mild pulmonary vascular congestion. Left base alveolar consolidation may represent pneumonia. No pneumothorax or pleural effusion. IMPRESSION: Left base consolidation. Electronically Signed   By: Sydell Eva M.D.   On: 08/10/2023 22:01      Signature  -   Vita Grip M.D on 08/11/2023 at 8:44 PM   -  To page go to [email or web address]

## 2023-08-11 NOTE — ED Notes (Addendum)
 Iv paused by RN after pt complained of pain for further evaluation. Shortly on return pump found disconnected, tubing on floor and pumped turned of by PT. Pt refused further use of IV stating "why dont you just call the IV team to place another IV". Receiving RN told of event.

## 2023-08-11 NOTE — Progress Notes (Signed)
 Patient ambulated in room air approximately , c/o shortness of breath and when O2 checked it was 87-89%. Placed back on O2 at 5l and reading went up to 95%.

## 2023-08-12 DIAGNOSIS — J9601 Acute respiratory failure with hypoxia: Secondary | ICD-10-CM | POA: Diagnosis not present

## 2023-08-12 DIAGNOSIS — R0902 Hypoxemia: Secondary | ICD-10-CM

## 2023-08-12 DIAGNOSIS — J189 Pneumonia, unspecified organism: Secondary | ICD-10-CM | POA: Diagnosis not present

## 2023-08-12 LAB — RENAL FUNCTION PANEL
Albumin: 2.7 g/dL — ABNORMAL LOW (ref 3.5–5.0)
Anion gap: 10 (ref 5–15)
BUN: 7 mg/dL (ref 6–20)
CO2: 27 mmol/L (ref 22–32)
Calcium: 7.9 mg/dL — ABNORMAL LOW (ref 8.9–10.3)
Chloride: 103 mmol/L (ref 98–111)
Creatinine, Ser: 0.95 mg/dL (ref 0.44–1.00)
GFR, Estimated: >60 mL/min (ref >60)
Glucose, Bld: 86 mg/dL (ref 70–99)
Phosphorus: 4.5 mg/dL (ref 2.5–4.6)
Potassium: 3.7 mmol/L (ref 3.5–5.1)
Sodium: 140 mmol/L (ref 135–145)

## 2023-08-12 LAB — CBC
HCT: 33.6 % — ABNORMAL LOW (ref 36.0–46.0)
Hemoglobin: 10.5 g/dL — ABNORMAL LOW (ref 12.0–15.0)
MCH: 24.9 pg — ABNORMAL LOW (ref 26.0–34.0)
MCHC: 31.3 g/dL (ref 30.0–36.0)
MCV: 79.8 fL — ABNORMAL LOW (ref 80.0–100.0)
Platelets: 196 10*3/uL (ref 150–400)
RBC: 4.21 MIL/uL (ref 3.87–5.11)
RDW: 16 % — ABNORMAL HIGH (ref 11.5–15.5)
WBC: 3.9 10*3/uL — ABNORMAL LOW (ref 4.0–10.5)
nRBC: 0 % (ref 0.0–0.2)

## 2023-08-12 LAB — GLUCOSE, CAPILLARY
Glucose-Capillary: 86 mg/dL (ref 70–99)
Glucose-Capillary: 76 mg/dL (ref 70–99)
Glucose-Capillary: 122 mg/dL — ABNORMAL HIGH (ref 70–99)
Glucose-Capillary: 111 mg/dL — ABNORMAL HIGH (ref 70–99)

## 2023-08-12 LAB — MYCOPLASMA PNEUMONIAE ANTIBODY, IGM: Mycoplasma pneumo IgM: <770 U/mL (ref 0–769)

## 2023-08-12 MED ORDER — AZITHROMYCIN 250 MG PO TABS
500.0000 mg | ORAL_TABLET | Freq: Every day | ORAL | Status: DC
  Administered 2023-08-12 – 2023-08-13 (×2): 500 mg via ORAL
  Filled 2023-08-12 (×2): qty 2

## 2023-08-12 MED ORDER — IPRATROPIUM-ALBUTEROL 0.5-2.5 (3) MG/3ML IN SOLN
3.0000 mL | Freq: Four times a day (QID) | RESPIRATORY_TRACT | Status: DC | PRN
Start: 2023-08-12 — End: 2023-08-13
  Administered 2023-08-13: 3 mL via RESPIRATORY_TRACT
  Filled 2023-08-12: qty 3

## 2023-08-12 MED ORDER — ORAL CARE MOUTH RINSE: 15.0000 mL | OROMUCOSAL | Status: DC | PRN

## 2023-08-12 NOTE — Progress Notes (Signed)
 PROGRESS NOTE                                                                                                                                                                                                             Patient Demographics:    Janice Taylor, is a 33 y.o. female, DOB - 01/05/1991, ION:[id number]  Outpatient Primary MD for the patient is Jodi Munroe    LOS - 2  Admit date - 08/10/2023    Chief Complaint  Patient presents with   Shortness of Breath       Brief Narrative (HPI from H&P)    Janice Taylor is a 33 y.o. female with medical history significant for type 2 diabetes mellitus, depression, GAD, essential hypertension, allergic rhinitis, who is admitted to [facility] on 08/10/2023 with community-acquired pneumonia complicated by acute hypoxic respiratory distress after presenting from home to [facility] ED complaining of shortness of breath.    The patient reports 3 to 4 days of progressive shortness of breath associated with new onset cough, mostly nonproductive.  She also notes associated subjective fever, chills, in the absence of rigors or generalized myalgias.  She conveys that her shortness of breath has not been associate with any orthopnea, PND, or worsening peripheral edema.  Denies any associated chest pain, palpitations, diaphoresis.  No wheezing or hemoptysis.   Denies any known chronic underlying pulmonary pathology, including no known history of underlying asthma.  She also denies any known history of chronic baseline supplemental oxygen requirements.   She had presented to an emergency department at an outside hospital on 08/09/2023, at which time she was diagnosed with community-acquired pneumonia.  She was discharged home from the emergency department with prescription for Levaquin .  She has taken 1 interval dose of Levaquin , but in the setting of further progression in her shortness of  breath, she presents to [facility] emergency department this evening for further evaluation management thereof.   Subjective:    Sera Hitsman was evaluated at the bed side.  Patient report having multiple coughing fits last night.  She was given Tussionex by the overnight provider which helped her sleep.  She continues to have congested cough because her O2 to drop.  Her O2 sat dropped to the high 80s with ambulation.  She denies any fevers, chills or chest  pain.   Assessment  & Plan :    Assessment and Plan:  # Community-acquired pneumonia Presented with clinical and radiological evidence of pneumonia. Still with ongoing cough and shortness of breath with ambulation. Remains on 5 L [place] with O2 sats in the low to mid 90s. -Continue IV Rocephin  and azithromycin  -Start as needed DuoNebs - Follow-up sputum culture and mycoplasma antibody - Start Mucinex  DM twice daily and continue as needed Tessalon  Perles - Incentive spirometer, flatter valve   # Acute hypoxic respiratory failure In the setting of pneumonia. Still requiring 5 L [place] with O2 dropping today high 80s with ambulation today. - Continue supplemental O2, wean as able -Continue as needed nebulized solution - Repeat ambulate with O2 tomorrow morning - Incentive spirometer, flutter valve   # Hypokalemia, resolved  # Type 2 diabetes Significantly improved with A1c down from 7.0% 2 years ago to 5.0% on admission.  CBGs remain stable but his 70s to 120s. -Continue SSI, sensitive -Continue CBG monitoring  # Anxiety and depression -Continue amitriptyline, BuSpar , Cymbalta , and Lamictal   - Continue home Xanax  0.5 mg 3 times daily  # HTN BP currently stable with SBP in the 110s to 130s. -Continue to hold home BP meds  # Undiagnosed OSA Patient with likely undiagnosed OSA with a STOP-BANG score of 5 indicating high risk of moderate to severe OSA. -Outpatient sleep study with PCP or pulmonology       Nutrition Problem:         Obesity: Estimated body mass index is 59.07 kg/m as calculated from the following:   Height as of this encounter: 5\' 4"  (1.626 m).   Weight as of this encounter: 156.1 kg.          Condition -stable  Family Communication  : Discussed plans with daughter at bedside  Code Status : Full  Consults  : None  PUD Prophylaxis : None   Procedures  :     None      Disposition Plan  :    Status is: Inpatient Remains inpatient appropriate because: Management of pneumonia and acute hypoxic respiratory failure  DVT Prophylaxis  :    SCDs Start: 08/10/23 2330     Lab Results  Component Value Date   PLT 196 08/12/2023    Diet :  Diet Order             Diet regular Room service appropriate? Yes; Fluid consistency: Thin  Diet effective now                    Inpatient Medications  Scheduled Meds:  ALPRAZolam   0.5 mg Oral TID   busPIRone   15 mg Oral BID   cetirizine   10 mg Oral Daily   dextromethorphan -guaiFENesin   1 tablet Oral BID   DULoxetine   60 mg Oral Daily   insulin  aspart  0-9 Units Subcutaneous TID WC   lamoTRIgine   200 mg Oral Daily   QUEtiapine   150 mg Oral QHS   Continuous Infusions:  azithromycin  Stopped (08/11/23 1647)   cefTRIAXone  (ROCEPHIN )  IV 1 g (08/11/23 1822)   promethazine  (PHENERGAN ) injection (IM or IVPB)     PRN Meds:.acetaminophen  **OR** acetaminophen , benzonatate , levalbuterol , melatonin, menthol -cetylpyridinium, mouth rinse, promethazine  (PHENERGAN ) injection (IM or IVPB)  Antibiotics  :    Anti-infectives (From admission, onward)    Start     Dose/Rate Route Frequency Ordered Stop   08/11/23 1600  azithromycin  (ZITHROMAX ) 500 mg in sodium chloride  0.9 % 250  mL IVPB        500 mg 250 mL/hr over 60 Minutes Intravenous Every 24 hours 08/10/23 2333     08/11/23 1600  cefTRIAXone  (ROCEPHIN ) 1 g in sodium chloride  0.9 % 100 mL IVPB        1 g 200 mL/hr over 30 Minutes Intravenous Every 24 hours 08/10/23 2333      08/10/23 2000  cefTRIAXone  (ROCEPHIN ) 2 g in sodium chloride  0.9 % 100 mL IVPB        2 g 200 mL/hr over 30 Minutes Intravenous Once 08/10/23 1948 08/10/23 2105   08/10/23 2000  azithromycin  (ZITHROMAX ) 500 mg in sodium chloride  0.9 % 250 mL IVPB        500 mg 250 mL/hr over 60 Minutes Intravenous  Once 08/10/23 1948 08/10/23 2202         Objective:   Vitals:   08/12/23 0030 08/12/23 0351 08/12/23 0500 08/12/23 0755  BP: 124/78 126/72  107/73  Pulse: 91 93  92  Resp: 18 19  16   Temp: 98.3 F (36.8 C) 98.2 F (36.8 C)  98.7 F (37.1 C)  TempSrc: Oral Oral    SpO2: 96% 93%  97%  Weight:   (!) 156.1 kg   Height:        Wt Readings from Last 3 Encounters:  08/12/23 (!) 156.1 kg  07/29/23 (!) 158.8 kg  03/12/23 (!) 149.2 kg     Intake/Output Summary (Last 24 hours) at 08/12/2023 0857 Last data filed at 08/11/2023 2200 Gross per 24 hour  Intake 605.76 ml  Output --  Net 605.76 ml     Physical Exam  General: Pleasant, well-appearing obese middle-age woman laying in bed. No acute distress. HEENT: [place]/AT. Anicteric sclera CV: RRR. No murmurs, rubs, or gallops. No LE edema Pulmonary: On 5 L [place]. Lungs CTAB. Normal effort. No wheezing or rales. Decreased air movement throughout. Abdominal: Soft, NT/ND. Normal bowel sounds. Extremities: Palpable radial and DP pulses. Normal ROM. Skin: Warm and dry. No obvious rash or lesions. Neuro: A&Ox3. Moves all extremities. Normal sensation to light touch. No focal deficit. Psych: Anxious mood    RN pressure injury documentation:      Data Review:    Recent Labs  Lab 08/10/23 1947 08/11/23 0445 08/11/23 0504 08/12/23 0335  WBC 5.6 5.2  --  3.9*  HGB 11.6* 11.5* 11.6* 10.5*  HCT 37.3 36.3 34.0* 33.6*  PLT 217 207  --  196  MCV 80.7 79.8*  --  79.8*  MCH 25.1* 25.3*  --  24.9*  MCHC 31.1 31.7  --  31.3  RDW 16.0* 16.0*  --  16.0*  LYMPHSABS 0.9 0.9  --   --   MONOABS 0.4 0.5  --   --   EOSABS 0.1 0.0  --   --    BASOSABS 0.0 0.0  --   --     Recent Labs  Lab 08/10/23 1947 08/10/23 2014 08/10/23 2035 08/11/23 0445 08/11/23 0504 08/12/23 0335  NA 136  --   --  137 137 140  K 3.4*  --   --  3.3* 3.2* 3.7  CL 101  --   --  101  --  103  CO2 24  --   --  26  --  27  ANIONGAP 11  --   --  10  --  10  GLUCOSE 104*  --   --  103*  --  86  BUN 9  --   --  8  --  7  CREATININE 1.13*  --   --  1.17*  --  0.95  AST 22  --   --  22  --   --   ALT 17  --   --  15  --   --   ALKPHOS 85  --   --  82  --   --   BILITOT 0.5  --   --  0.2  --   --   ALBUMIN 3.1*  --   --  3.0*  --  2.7*  PROCALCITON  --   --   --  0.10  --   --   LATICACIDVEN  --   --  1.4  --   --   --   INR 1.1  --   --   --   --   --   HGBA1C  --  5.0  --   --   --   --   BNP  --  12.6  --   --   --   --   MG  --  1.6*  --  2.3  --   --   CALCIUM  8.0*  --   --  7.9*  --  7.9*      Recent Labs  Lab 08/10/23 1947 08/10/23 2014 08/10/23 2035 08/11/23 0445 08/12/23 0335  PROCALCITON  --   --   --  0.10  --   LATICACIDVEN  --   --  1.4  --   --   INR 1.1  --   --   --   --   HGBA1C  --  5.0  --   --   --   BNP  --  12.6  --   --   --   MG  --  1.6*  --  2.3  --   CALCIUM  8.0*  --   --  7.9* 7.9*    --------------------------------------------------------------------------------------------------------------- Lab Results  Component Value Date   CHOL 194 11/20/2020   HDL 52 11/20/2020   LDLCALC 124 (H) 11/20/2020   TRIG 92 11/20/2020   CHOLHDL 3.7 11/20/2020    Lab Results  Component Value Date   HGBA1C 5.0 08/10/2023   No results for input(s): "TSH", "T4TOTAL", "FREET4", "T3FREE", "THYROIDAB" in the last 72 hours. No results for input(s): "VITAMINB12", "FOLATE", "FERRITIN", "TIBC", "IRON", "RETICCTPCT" in the last 72 hours. ------------------------------------------------------------------------------------------------------------------ Cardiac Enzymes No results for input(s): "CKMB", "TROPONINI", "MYOGLOBIN"  in the last 168 hours.  Invalid input(s): "CK"  Micro Results Recent Results (from the past 240 hours)  Resp panel by RT-PCR (RSV, Flu A&B, Covid) Anterior Nasal Swab     Status: None   Collection Time: 08/10/23  7:47 PM   Specimen: Anterior Nasal Swab  Result Value Ref Range Status   SARS Coronavirus 2 by RT PCR NEGATIVE NEGATIVE Final   Influenza A by PCR NEGATIVE NEGATIVE Final   Influenza B by PCR NEGATIVE NEGATIVE Final    Comment: (NOTE) The Xpert Xpress SARS-CoV-2/FLU/RSV plus assay is intended as an aid in the diagnosis of influenza from Nasopharyngeal swab specimens and should not be used as a sole basis for treatment. Nasal washings and aspirates are unacceptable for Xpert Xpress SARS-CoV-2/FLU/RSV testing.  Fact Sheet for Patients: BloggerCourse.com  Fact Sheet for Healthcare Providers: SeriousBroker.it  This test is not yet approved or cleared by the [place]  FDA and has been authorized for detection and/or diagnosis of SARS-CoV-2 by FDA under  an Emergency Use Authorization (EUA). This EUA will remain in effect (meaning this test can be used) for the duration of the COVID-19 declaration under Section 564(b)(1) of the Act, 21 U.S.C. section 360bbb-3(b)(1), unless the authorization is terminated or revoked.     Resp Syncytial Virus by PCR NEGATIVE NEGATIVE Final    Comment: (NOTE) Fact Sheet for Patients: BloggerCourse.com  Fact Sheet for Healthcare Providers: SeriousBroker.it  This test is not yet approved or cleared by the [place]  FDA and has been authorized for detection and/or diagnosis of SARS-CoV-2 by FDA under an Emergency Use Authorization (EUA). This EUA will remain in effect (meaning this test can be used) for the duration of the COVID-19 declaration under Section 564(b)(1) of the Act, 21 U.S.C. section 360bbb-3(b)(1), unless the  authorization is terminated or revoked.  Performed at [facility], [address], [place], [place] [address]   Blood Culture (routine x 2)     Status: None (Preliminary result)   Collection Time: 08/10/23  7:52 PM   Specimen: BLOOD  Result Value Ref Range Status   Specimen Description BLOOD RIGHT ANTECUBITAL  Final   Special Requests   Final    BOTTLES DRAWN AEROBIC AND ANAEROBIC Blood Culture results may not be optimal due to an inadequate volume of blood received in culture bottles   Culture   Final    NO GROWTH < 12 HOURS Performed at [facility], [address], [place], [place] [address]    Report Status PENDING  Incomplete  Blood Culture (routine x 2)     Status: None (Preliminary result)   Collection Time: 08/10/23  8:15 PM   Specimen: BLOOD RIGHT ARM  Result Value Ref Range Status   Specimen Description BLOOD RIGHT ARM  Final   Special Requests   Final    BOTTLES DRAWN AEROBIC AND ANAEROBIC Blood Culture results may not be optimal due to an inadequate volume of blood received in culture bottles   Culture   Final    NO GROWTH < 12 HOURS Performed at [facility], [address], [place], [place] [address]    Report Status PENDING  Incomplete    Radiology Reports DG Chest Port 1 View Result Date: 08/10/2023 CLINICAL DATA:  Questionable sepsis - evaluate for abnormality EXAM: PORTABLE CHEST - 1 VIEW COMPARISON:  07/29/2023. FINDINGS: Cardiac silhouette unremarkable. Mild pulmonary vascular congestion. Left base alveolar consolidation may represent pneumonia. No pneumothorax or pleural effusion. IMPRESSION: Left base consolidation. Electronically Signed   By: Sydell Eva M.D.   On: 08/10/2023 22:01      Signature  -   Vita Grip M.D on 08/12/2023 at 8:57 AM   -  To page go to [email or web address]

## 2023-08-12 NOTE — Plan of Care (Signed)
  Problem: Coping: Goal: Ability to adjust to condition or change in health will improve Outcome: Progressing   Problem: Health Behavior/Discharge Planning: Goal: Ability to identify and utilize available resources and services will improve Outcome: Progressing Goal: Ability to manage health-related needs will improve Outcome: Progressing   Problem: Metabolic: Goal: Ability to maintain appropriate glucose levels will improve Outcome: Progressing   Problem: Nutritional: Goal: Maintenance of adequate nutrition will improve Outcome: Progressing   Problem: Skin Integrity: Goal: Risk for impaired skin integrity will decrease Outcome: Progressing   Problem: Education: Goal: Knowledge of General Education information will improve Description: Including pain rating scale, medication(s)/side effects and non-pharmacologic comfort measures Outcome: Progressing   Problem: Health Behavior/Discharge Planning: Goal: Ability to manage health-related needs will improve Outcome: Progressing   Problem: Clinical Measurements: Goal: Ability to maintain clinical measurements within normal limits will improve Outcome: Progressing   Problem: Activity: Goal: Risk for activity intolerance will decrease Outcome: Progressing   Problem: Nutrition: Goal: Adequate nutrition will be maintained Outcome: Progressing   Problem: Pain Managment: Goal: General experience of comfort will improve and/or be controlled Outcome: Progressing

## 2023-08-13 DIAGNOSIS — J189 Pneumonia, unspecified organism: Secondary | ICD-10-CM | POA: Diagnosis not present

## 2023-08-13 DIAGNOSIS — J9601 Acute respiratory failure with hypoxia: Secondary | ICD-10-CM | POA: Diagnosis not present

## 2023-08-13 LAB — CBC
HCT: 33.3 % — ABNORMAL LOW (ref 36.0–46.0)
Hemoglobin: 10.3 g/dL — ABNORMAL LOW (ref 12.0–15.0)
MCH: 24.8 pg — ABNORMAL LOW (ref 26.0–34.0)
MCHC: 30.9 g/dL (ref 30.0–36.0)
MCV: 80.2 fL (ref 80.0–100.0)
Platelets: 221 10*3/uL (ref 150–400)
RBC: 4.15 MIL/uL (ref 3.87–5.11)
RDW: 15.9 % — ABNORMAL HIGH (ref 11.5–15.5)
WBC: 4.8 10*3/uL (ref 4.0–10.5)
nRBC: 0 % (ref 0.0–0.2)

## 2023-08-13 LAB — GLUCOSE, CAPILLARY
Glucose-Capillary: 90 mg/dL (ref 70–99)
Glucose-Capillary: 107 mg/dL — ABNORMAL HIGH (ref 70–99)
Glucose-Capillary: 76 mg/dL (ref 70–99)
Glucose-Capillary: 119 mg/dL — ABNORMAL HIGH (ref 70–99)

## 2023-08-13 LAB — BASIC METABOLIC PANEL WITH GFR
Anion gap: 7 (ref 5–15)
BUN: 8 mg/dL (ref 6–20)
CO2: 26 mmol/L (ref 22–32)
Calcium: 7.9 mg/dL — ABNORMAL LOW (ref 8.9–10.3)
Chloride: 105 mmol/L (ref 98–111)
Creatinine, Ser: 0.87 mg/dL (ref 0.44–1.00)
GFR, Estimated: >60 mL/min (ref >60)
Glucose, Bld: 99 mg/dL (ref 70–99)
Potassium: 3.8 mmol/L (ref 3.5–5.1)
Sodium: 138 mmol/L (ref 135–145)

## 2023-08-13 MED ORDER — ALBUTEROL SULFATE HFA 108 (90 BASE) MCG/ACT IN AERS: 1.0000 | INHALATION_SPRAY | Freq: Four times a day (QID) | RESPIRATORY_TRACT | Status: DC | PRN

## 2023-08-13 MED ORDER — IPRATROPIUM-ALBUTEROL 0.5-2.5 (3) MG/3ML IN SOLN
3.0000 mL | Freq: Three times a day (TID) | RESPIRATORY_TRACT | Status: DC
  Administered 2023-08-13 – 2023-08-14 (×2): 3 mL via RESPIRATORY_TRACT
  Filled 2023-08-13 (×3): qty 3

## 2023-08-13 NOTE — Progress Notes (Signed)
 Patient was 88 percent oxygen saturation on room air at rest. Patient placed on 3 liters of oxygen via nasal cannula and recovered to 91 percent oxygen saturation. Patient ambulated in hallway on 3 liters of oxygen saturation and was at 92 percent oxygen saturation. Patient back to room, resting in bed, with no further complaints.

## 2023-08-13 NOTE — Plan of Care (Signed)
  Problem: Health Behavior/Discharge Planning: Goal: Ability to manage health-related needs will improve Outcome: Progressing   Problem: Nutritional: Goal: Maintenance of adequate nutrition will improve Outcome: Progressing   Problem: Education: Goal: Knowledge of General Education information will improve Description: Including pain rating scale, medication(s)/side effects and non-pharmacologic comfort measures Outcome: Progressing   Problem: Health Behavior/Discharge Planning: Goal: Ability to manage health-related needs will improve Outcome: Progressing

## 2023-08-13 NOTE — Progress Notes (Signed)
 PROGRESS NOTE                                                                                                                                                                                                             Patient Demographics:    Janice Taylor, is a 33 y.o. female, DOB - [birth year], ZOX:[id number]  Outpatient Primary MD for the patient is Jodi Munroe    LOS - 3  Admit date - 08/10/2023    Chief Complaint  Patient presents with   Shortness of Breath       Brief Narrative (HPI from H&P)    Janice Taylor is a 33 y.o. female with medical history significant for type 2 diabetes mellitus, depression, GAD, essential hypertension, allergic rhinitis, who is admitted to [facility] on 08/10/2023 with community-acquired pneumonia complicated by acute hypoxic respiratory distress after presenting from home to [facility] ED complaining of shortness of breath.    The patient reports 3 to 4 days of progressive shortness of breath associated with new onset cough, mostly nonproductive.  She also notes associated subjective fever, chills, in the absence of rigors or generalized myalgias.  She conveys that her shortness of breath has not been associate with any orthopnea, PND, or worsening peripheral edema.  Denies any associated chest pain, palpitations, diaphoresis.  No wheezing or hemoptysis.   Denies any known chronic underlying pulmonary pathology, including no known history of underlying asthma.  She also denies any known history of chronic baseline supplemental oxygen requirements.   She had presented to an emergency department at an outside hospital on 08/09/2023, at which time she was diagnosed with community-acquired pneumonia.  She was discharged home from the emergency department with prescription for Levaquin .  She has taken 1 interval dose of Levaquin , but in the setting of further progression in her shortness of  breath, she presents to [facility] emergency department this evening for further evaluation management thereof.   Subjective:    Janice Taylor was evaluated at the bed side. Continues to improve but still requiring oxygen. Reports she felt exhausted and short of breath with ambulation today. She has been weaned down to 3 L. Discussed that she might need to be discharged home on oxygen and patient okay with this.  Reports having some wheezing yesterday but did not get the  as needed DuoNebs as ordered.   Assessment  & Plan :    Assessment and Plan:  # Community-acquired pneumonia Presented with clinical and radiological evidence of pneumonia. Continues to have congested cough but this is improving. Sputum culture never collected, will cancel order.  - Continue IV Rocephin  and azithromycin  - Continue DuoNebs - Continue Mucinex  DM twice daily and as needed Tessalon  Perles - Incentive spirometer, flatter valve   # Acute hypoxic respiratory failure In the setting of pneumonia.  Continues to desat below 90 on room air. Remains on O2 but currently weaned down to 3 L. Reports feeling very fatigued after walking around the hall with the oxygen. Will be okay with going home with oxygen but will need a portable O2 tank. - Continue supplemental O2, wean as able - Scheduled DuoNebs and start as needed albuterol  inhaler - Repeat ambulate with O2 tomorrow morning - Incentive spirometer, flutter valve   # Hypokalemia, resolved  # Type 2 diabetes Significantly improved with A1c down from 7.0% 2 years ago to 5.0% on admission.  CBGs remain stable but his 70s to 120s. - Continue SSI, sensitive - Continue CBG monitoring  # Anxiety and depression -Continue amitriptyline, BuSpar , Cymbalta , and Lamictal   - Continue home Xanax  0.5 mg 3 times daily  # HTN BP currently stable with SBP in the 110s to 130s. - Continue to hold home BP meds  # Undiagnosed OSA Patient with likely undiagnosed OSA with a  STOP-BANG score of 5 indicating high risk of moderate to severe OSA. - Outpatient sleep study with PCP or pulmonology       Nutrition Problem:        Obesity: Estimated body mass index is 58.05 kg/m as calculated from the following:   Height as of this encounter: 5\' 4"  (1.626 m).   Weight as of this encounter: 153.4 kg.          Condition -stable  Family Communication  : Discussed plans with daughter at bedside  Code Status : Full  Consults  : None  PUD Prophylaxis : None   Procedures  :     None      Disposition Plan  :    Status is: Inpatient Remains inpatient appropriate because: Management of pneumonia and acute hypoxic respiratory failure  DVT Prophylaxis  :    SCDs Start: 08/10/23 2330     Lab Results  Component Value Date   PLT 221 08/13/2023    Diet :  Diet Order             Diet regular Room service appropriate? Yes; Fluid consistency: Thin  Diet effective now                    Inpatient Medications  Scheduled Meds:  ALPRAZolam   0.5 mg Oral TID   azithromycin   500 mg Oral q1800   busPIRone   15 mg Oral BID   cetirizine   10 mg Oral Daily   dextromethorphan -guaiFENesin   1 tablet Oral BID   DULoxetine   60 mg Oral Daily   insulin  aspart  0-9 Units Subcutaneous TID WC   lamoTRIgine   200 mg Oral Daily   QUEtiapine   150 mg Oral QHS   Continuous Infusions:  cefTRIAXone  (ROCEPHIN )  IV Stopped (08/12/23 1721)   promethazine  (PHENERGAN ) injection (IM or IVPB)     PRN Meds:.acetaminophen  **OR** acetaminophen , benzonatate , ipratropium-albuterol , melatonin, menthol -cetylpyridinium, mouth rinse, promethazine  (PHENERGAN ) injection (IM or IVPB)  Antibiotics  :    Anti-infectives (From  admission, onward)    Start     Dose/Rate Route Frequency Ordered Stop   08/12/23 1800  azithromycin  (ZITHROMAX ) tablet 500 mg        500 mg Oral Daily-1800 08/12/23 1251 08/15/23 1759   08/11/23 1600  azithromycin  (ZITHROMAX ) 500 mg in sodium  chloride 0.9 % 250 mL IVPB  Status:  Discontinued        500 mg 250 mL/hr over 60 Minutes Intravenous Every 24 hours 08/10/23 2333 08/12/23 1251   08/11/23 1600  cefTRIAXone  (ROCEPHIN ) 1 g in sodium chloride  0.9 % 100 mL IVPB        1 g 200 mL/hr over 30 Minutes Intravenous Every 24 hours 08/10/23 2333 08/16/23 1559   08/10/23 2000  cefTRIAXone  (ROCEPHIN ) 2 g in sodium chloride  0.9 % 100 mL IVPB        2 g 200 mL/hr over 30 Minutes Intravenous Once 08/10/23 1948 08/10/23 2105   08/10/23 2000  azithromycin  (ZITHROMAX ) 500 mg in sodium chloride  0.9 % 250 mL IVPB        500 mg 250 mL/hr over 60 Minutes Intravenous  Once 08/10/23 1948 08/10/23 2202         Objective:   Vitals:   08/12/23 2036 08/13/23 0500 08/13/23 0622 08/13/23 0834  BP: 115/84  123/70 111/61  Pulse: 96  97 87  Resp: 20  19 18   Temp: 98.3 F (36.8 C)  99 F (37.2 C) 98.2 F (36.8 C)  TempSrc: Oral  Oral Oral  SpO2: 100%  96% 97%  Weight:  (!) 153.4 kg    Height:        Wt Readings from Last 3 Encounters:  08/13/23 (!) 153.4 kg  07/29/23 (!) 158.8 kg  03/12/23 (!) 149.2 kg     Intake/Output Summary (Last 24 hours) at 08/13/2023 0919 Last data filed at 08/13/2023 0600 Gross per 24 hour  Intake 490 ml  Output 4 ml  Net 486 ml     Physical Exam  General: Pleasant, well-appearing obese woman sitting in bed. No acute distress. HEENT: [place]/AT. Anicteric sclera CV: RRR. No murmurs, rubs, or gallops. No LE edema Pulmonary: On 3 L . Lungs CTAB. Normal effort. No wheezing or rales. Decreased air movement throughout. Extremities: Palpable radial and DP pulses. Normal ROM. Skin: Warm and dry. No obvious rash or lesions. Neuro: A&Ox3. Moves all extremities. Normal sensation to light touch. No focal deficit.    RN pressure injury documentation:      Data Review:    Recent Labs  Lab 08/10/23 1947 08/11/23 0445 08/11/23 0504 08/12/23 0335 08/13/23 0619  WBC 5.6 5.2  --  3.9* 4.8  HGB 11.6* 11.5*  11.6* 10.5* 10.3*  HCT 37.3 36.3 34.0* 33.6* 33.3*  PLT 217 207  --  196 221  MCV 80.7 79.8*  --  79.8* 80.2  MCH 25.1* 25.3*  --  24.9* 24.8*  MCHC 31.1 31.7  --  31.3 30.9  RDW 16.0* 16.0*  --  16.0* 15.9*  LYMPHSABS 0.9 0.9  --   --   --   MONOABS 0.4 0.5  --   --   --   EOSABS 0.1 0.0  --   --   --   BASOSABS 0.0 0.0  --   --   --     Recent Labs  Lab 08/10/23 1947 08/10/23 2014 08/10/23 2035 08/11/23 0445 08/11/23 0504 08/12/23 0335 08/13/23 0619  NA 136  --   --  137 137  140 138  K 3.4*  --   --  3.3* 3.2* 3.7 3.8  CL 101  --   --  101  --  103 105  CO2 24  --   --  26  --  27 26  ANIONGAP 11  --   --  10  --  10 7  GLUCOSE 104*  --   --  103*  --  86 99  BUN 9  --   --  8  --  7 8  CREATININE 1.13*  --   --  1.17*  --  0.95 0.87  AST 22  --   --  22  --   --   --   ALT 17  --   --  15  --   --   --   ALKPHOS 85  --   --  82  --   --   --   BILITOT 0.5  --   --  0.2  --   --   --   ALBUMIN 3.1*  --   --  3.0*  --  2.7*  --   PROCALCITON  --   --   --  0.10  --   --   --   LATICACIDVEN  --   --  1.4  --   --   --   --   INR 1.1  --   --   --   --   --   --   HGBA1C  --  5.0  --   --   --   --   --   BNP  --  12.6  --   --   --   --   --   MG  --  1.6*  --  2.3  --   --   --   CALCIUM  8.0*  --   --  7.9*  --  7.9* 7.9*      Recent Labs  Lab 08/10/23 1947 08/10/23 2014 08/10/23 2035 08/11/23 0445 08/12/23 0335 08/13/23 0619  PROCALCITON  --   --   --  0.10  --   --   LATICACIDVEN  --   --  1.4  --   --   --   INR 1.1  --   --   --   --   --   HGBA1C  --  5.0  --   --   --   --   BNP  --  12.6  --   --   --   --   MG  --  1.6*  --  2.3  --   --   CALCIUM  8.0*  --   --  7.9* 7.9* 7.9*    --------------------------------------------------------------------------------------------------------------- Lab Results  Component Value Date   CHOL 194 11/20/2020   HDL 52 11/20/2020   LDLCALC 124 (H) 11/20/2020   TRIG 92 11/20/2020   CHOLHDL 3.7 11/20/2020     Lab Results  Component Value Date   HGBA1C 5.0 08/10/2023   No results for input(s): "TSH", "T4TOTAL", "FREET4", "T3FREE", "THYROIDAB" in the last 72 hours. No results for input(s): "VITAMINB12", "FOLATE", "FERRITIN", "TIBC", "IRON", "RETICCTPCT" in the last 72 hours. ------------------------------------------------------------------------------------------------------------------ Cardiac Enzymes No results for input(s): "CKMB", "TROPONINI", "MYOGLOBIN" in the last 168 hours.  Invalid input(s): "CK"  Micro Results Recent Results (from the past 240 hours)  Resp panel by RT-PCR (RSV, Flu A&B, Covid) Anterior Nasal Swab  Status: None   Collection Time: 08/10/23  7:47 PM   Specimen: Anterior Nasal Swab  Result Value Ref Range Status   SARS Coronavirus 2 by RT PCR NEGATIVE NEGATIVE Final   Influenza A by PCR NEGATIVE NEGATIVE Final   Influenza B by PCR NEGATIVE NEGATIVE Final    Comment: (NOTE) The Xpert Xpress SARS-CoV-2/FLU/RSV plus assay is intended as an aid in the diagnosis of influenza from Nasopharyngeal swab specimens and should not be used as a sole basis for treatment. Nasal washings and aspirates are unacceptable for Xpert Xpress SARS-CoV-2/FLU/RSV testing.  Fact Sheet for Patients: BloggerCourse.com  Fact Sheet for Healthcare Providers: SeriousBroker.it  This test is not yet approved or cleared by the [place]  FDA and has been authorized for detection and/or diagnosis of SARS-CoV-2 by FDA under an Emergency Use Authorization (EUA). This EUA will remain in effect (meaning this test can be used) for the duration of the COVID-19 declaration under Section 564(b)(1) of the Act, 21 U.S.C. section 360bbb-3(b)(1), unless the authorization is terminated or revoked.     Resp Syncytial Virus by PCR NEGATIVE NEGATIVE Final    Comment: (NOTE) Fact Sheet for  Patients: BloggerCourse.com  Fact Sheet for Healthcare Providers: SeriousBroker.it  This test is not yet approved or cleared by the [place]  FDA and has been authorized for detection and/or diagnosis of SARS-CoV-2 by FDA under an Emergency Use Authorization (EUA). This EUA will remain in effect (meaning this test can be used) for the duration of the COVID-19 declaration under Section 564(b)(1) of the Act, 21 U.S.C. section 360bbb-3(b)(1), unless the authorization is terminated or revoked.  Performed at [facility], [address] [place], [place] [address]   Blood Culture (routine x 2)     Status: None (Preliminary result)   Collection Time: 08/10/23  7:52 PM   Specimen: BLOOD  Result Value Ref Range Status   Specimen Description BLOOD RIGHT ANTECUBITAL  Final   Special Requests   Final    BOTTLES DRAWN AEROBIC AND ANAEROBIC Blood Culture results may not be optimal due to an inadequate volume of blood received in culture bottles   Culture   Final    NO GROWTH 3 DAYS Performed at [facility], [address], [place], [place] [address]    Report Status PENDING  Incomplete  Blood Culture (routine x 2)     Status: None (Preliminary result)   Collection Time: 08/10/23  8:15 PM   Specimen: BLOOD RIGHT ARM  Result Value Ref Range Status   Specimen Description BLOOD RIGHT ARM  Final   Special Requests   Final    BOTTLES DRAWN AEROBIC AND ANAEROBIC Blood Culture results may not be optimal due to an inadequate volume of blood received in culture bottles   Culture   Final    NO GROWTH 3 DAYS Performed at [facility], [address], [place], [place] [address]    Report Status PENDING  Incomplete    Radiology Reports DG Chest Port 1 View Result Date: 08/10/2023 CLINICAL DATA:  Questionable sepsis - evaluate for abnormality EXAM: PORTABLE CHEST - 1 VIEW COMPARISON:  07/29/2023. FINDINGS: Cardiac silhouette  unremarkable. Mild pulmonary vascular congestion. Left base alveolar consolidation may represent pneumonia. No pneumothorax or pleural effusion. IMPRESSION: Left base consolidation. Electronically Signed   By: Sydell Eva M.D.   On: 08/10/2023 22:01      Signature  -   Vita Grip M.D on 08/13/2023 at 9:19 AM   -  To page go to [email or web address]

## 2023-08-13 NOTE — TOC Transition Note (Signed)
 Transition of Care [facility]) - Discharge Note   Patient Details  Name: Janice Taylor MRN: [id number] Date of Birth: 08/10/1990  [facility]) CM/SW Contact:  Jannine Meo, RN Phone Number: 08/13/2023, 2:42 PM   Clinical Narrative:  Secure message received from floor nurse that patient had questions about home oxygen. Spoke with patient by phone, no preference on Oxygen company. Home Oxygen order placed with Jermaine with [employer]. Provider plans to do another ambulatory Saturation test tomorrow morning. If patient still requires oxygen, will need to have attending covering tomorrow to place the order.         Patient Goals and CMS Choice            Discharge Placement                       Discharge Plan and Services Additional resources added to the After Visit Summary for                  DME Arranged: Oxygen DME Agency: [employer] Date DME Agency Contacted: 08/13/23 Time DME Agency Contacted: 1438 Representative spoke with at DME Agency: Jermaine            Social Drivers of Health (SDOH) Interventions SDOH Screenings   Food Insecurity: No Food Insecurity (08/11/2023)  Housing: Low Risk  (08/11/2023)  Transportation Needs: No Transportation Needs (08/11/2023)  Utilities: Not At Risk (08/11/2023)  Alcohol Screen: Low Risk  (09/20/2019)  Depression (PHQ2-9): Low Risk  (03/31/2020)  Financial Resource Strain: Low Risk  (07/07/2023)   Received from [facility]  Physical Activity: Insufficiently Active (07/07/2023)   Received from [facility]  Social Connections: Moderately Integrated (08/11/2023)  Stress: Stress Concern Present (07/07/2023)   Received from [facility]  Tobacco Use: Low Risk  (08/11/2023)     Readmission Risk Interventions     No data to display

## 2023-08-13 NOTE — Plan of Care (Signed)
  Problem: Coping: Goal: Ability to adjust to condition or change in health will improve Outcome: Progressing   Problem: Fluid Volume: Goal: Ability to maintain a balanced intake and output will improve Outcome: Progressing   Problem: Nutritional: Goal: Maintenance of adequate nutrition will improve Outcome: Progressing   Problem: Education: Goal: Knowledge of General Education information will improve Description: Including pain rating scale, medication(s)/side effects and non-pharmacologic comfort measures Outcome: Progressing   Problem: Clinical Measurements: Goal: Will remain free from infection Outcome: Progressing   Problem: Coping: Goal: Level of anxiety will decrease Outcome: Progressing   Problem: Pain Managment: Goal: General experience of comfort will improve and/or be controlled Outcome: Progressing   Problem: Safety: Goal: Ability to remain free from injury will improve Outcome: Progressing   Problem: Skin Integrity: Goal: Risk for impaired skin integrity will decrease Outcome: Progressing

## 2023-08-13 NOTE — Progress Notes (Signed)
 SATURATION QUALIFICATIONS: (This note is used to comply with regulatory documentation for home oxygen)  Patient Saturations on Room Air at Rest = 88%  Patient Saturations on Room Air while Ambulating = 88%  Patient Saturations on 3 Liters of oxygen while Ambulating = 92%  Please briefly explain why patient needs home oxygen:

## 2023-08-14 DIAGNOSIS — J189 Pneumonia, unspecified organism: Secondary | ICD-10-CM | POA: Diagnosis not present

## 2023-08-14 LAB — GLUCOSE, CAPILLARY
Glucose-Capillary: 144 mg/dL — ABNORMAL HIGH (ref 70–99)
Glucose-Capillary: 118 mg/dL — ABNORMAL HIGH (ref 70–99)

## 2023-08-14 MED ORDER — BENZONATATE 200 MG PO CAPS
200.0000 mg | ORAL_CAPSULE | Freq: Three times a day (TID) | ORAL | 0 refills | Status: AC | PRN
Start: 2023-08-14 — End: ?

## 2023-08-14 MED ORDER — CEFADROXIL 500 MG PO CAPS
500.0000 mg | ORAL_CAPSULE | Freq: Two times a day (BID) | ORAL | 0 refills | Status: AC
Start: 2023-08-14 — End: 2023-08-16

## 2023-08-14 MED ORDER — AZITHROMYCIN 500 MG PO TABS: 500.0000 mg | ORAL_TABLET | Freq: Every day | ORAL | 0 refills | Status: AC

## 2023-08-14 MED ORDER — DM-GUAIFENESIN ER 30-600 MG PO TB12: 1.0000 | ORAL_TABLET | Freq: Two times a day (BID) | ORAL | 0 refills | Status: AC

## 2023-08-14 NOTE — Discharge Summary (Signed)
 Physician Discharge Summary   Patient: Janice Taylor MRN: [id number] DOB: 11/18/1990  Admit date:     08/10/2023  Discharge date: 08/14/2023  Discharge Physician: Charlean Congress  PCP: Zella Hidalgo, PA-C  Recommendations at discharge: Follow-up with PCP in 1 week.   Follow-up Information     Zella Hidalgo, PA-C. Schedule an appointment as soon as possible for a visit in 1 week(s).   Specialties: Physician Assistant, Internal Medicine Contact information: [address] [place] [address] [phone]                Discharge Diagnoses: Principal Problem:   CAP (community acquired pneumonia) Active Problems:   GAD (generalized anxiety disorder)   Essential hypertension   Acute hypoxic respiratory failure (HCC)   SOB (shortness of breath)   Hypokalemia   Hypomagnesemia   DM2 (diabetes mellitus, type 2) (HCC)   Depression   Allergic rhinitis   Hypoxia    Hospital Course: Community-acquired pneumonia. Cultures were negative. Treated with antibiotics and nebulization therapy for Currently does not appear to have any indication for an aggressive therapy on an ongoing basis. Continue antibiotics on discharge.  Acute hypoxia. No significant respiratory failure. Treated with oxygen. Currently on room air. Outpatient follow-up.  Hypokalemia. Resolved  Type 2 diabetes mellitus controlled, no long-term insulin  use. Resume home regimen.  Anxiety and depression. On multiple medication at home. Will resume.  HTN for Patient is on amlodipine and hydrochlorothiazide  at home. Currently hold Norvasc. Resume HCTZ.  Possible undiagnosed sleep apnea. Obesity Class 3 Body mass index is 60.62 kg/m.  Placing the pt at higher risk of poor outcomes. Recommend outpatient follow-up with PCP for sleep studies.    Consultants:  None  Procedures performed:  None  DISCHARGE MEDICATION: Allergies as of 08/14/2023       Reactions   Ondansetron  Hcl Hives    Penicillins Rash   Pt tolerates ceftriaxone  well        Medication List     PAUSE taking these medications    amLODipine 10 MG tablet Wait to take this until your doctor or other care provider tells you to start again. Commonly known as: NORVASC Take 10 mg by mouth daily.       STOP taking these medications    levofloxacin  500 MG tablet Commonly known as: LEVAQUIN    methylPREDNISolone  4 MG Tbpk tablet Commonly known as: MEDROL  DOSEPAK       TAKE these medications    ALPRAZolam  0.5 MG tablet Commonly known as: XANAX  Take 1 tablet (0.5 mg total) by mouth in the morning, then 1 tablet (0.5 mg total) in the afternoon, and then 0.5 tablet (0.25 mg total) in the evening.   azithromycin  500 MG tablet Commonly known as: ZITHROMAX  Take 1 tablet (500 mg total) by mouth daily for 2 days.   benzonatate  200 MG capsule Commonly known as: TESSALON  Take 1 capsule (200 mg total) by mouth 3 (three) times daily as needed for cough (refractory cough).   busPIRone  15 MG tablet Commonly known as: BUSPAR  Take 15 mg by mouth 2 (two) times daily.   cefadroxil  500 MG capsule Commonly known as: DURICEF Take 1 capsule (500 mg total) by mouth 2 (two) times daily for 2 days.   cetirizine  10 MG tablet Commonly known as: ZyrTEC  Allergy Take 1 tablet (10 mg total) by mouth daily.   cyanocobalamin 1000 MCG/ML injection Commonly known as: VITAMIN B12 Inject 1,000 mcg into the skin every 30 (thirty) days.  dextromethorphan -guaiFENesin  30-600 MG 12hr tablet Commonly known as: MUCINEX  DM Take 1 tablet by mouth 2 (two) times daily for 7 days.   DULoxetine  60 MG capsule Commonly known as: CYMBALTA  Take 60 mg by mouth daily.   hydrochlorothiazide  25 MG tablet Commonly known as: HYDRODIURIL  Take 25 mg by mouth daily.   ibuprofen  200 MG tablet Commonly known as: ADVIL  Take 800 mg by mouth every 6 (six) hours as needed for mild pain (pain score 1-3) or fever.   Jardiance 25 MG Tabs  tablet Generic drug: empagliflozin Take by mouth daily.   ketoconazole 2 % cream Commonly known as: NIZORAL Apply 1 Application topically 2 (two) times daily.   lamoTRIgine  200 MG tablet Commonly known as: LAMICTAL  Take 200 mg by mouth daily.   metFORMIN  500 MG tablet Commonly known as: GLUCOPHAGE  Take by mouth 2 (two) times daily with a meal.   Mounjaro  15 MG/0.5ML Pen Generic drug: tirzepatide  Inject 15 mg into the skin once a week.   Multivitamin Women Tabs Take 1 tablet by mouth daily.   promethazine  25 MG tablet Commonly known as: PHENERGAN  Take 25 mg by mouth every 6 (six) hours as needed.   QUEtiapine  Fumarate 150 MG Tabs Take 1 tablet by mouth at bedtime.   SUMAtriptan 50 MG tablet Commonly known as: IMITREX Take 50 mg by mouth every 2 (two) hours as needed for migraine.   zolpidem  10 MG tablet Commonly known as: AMBIEN  Take 10 mg by mouth at bedtime.       Disposition: Home Diet recommendation: Cardiac diet  Discharge Exam: Vitals:   08/14/23 0900 08/14/23 0905 08/14/23 0910 08/14/23 1106  BP: (!) 150/67 (!) 148/86 (!) 148/101 (!) 141/75  Pulse: (!) 105 98 (!) 120 (!) 104  Resp: 16  16 18   Temp: 97.8 F (36.6 C)  97.8 F (36.6 C) 98.3 F (36.8 C)  TempSrc: Oral  Oral   SpO2: 100%  100% 97%  Weight:      Height:       General: Appear in no distress; no visible Abnormal Neck Mass Or lumps, Conjunctiva normal Cardiovascular: S1 and S2 Present, no Murmur, Respiratory: good respiratory effort, Bilateral Air entry present and CTA, no Crackles, no wheezes Abdomen: Bowel Sound present, . Non tender  Extremities: no Pedal edema Neurology: alert anoriented to time, place, and person  Filed Weights   08/12/23 0500 08/13/23 0500 08/14/23 4098  Weight: (!) 156.1 kg (!) 153.4 kg (!) 160.2 kg   Condition at discharge: stable  The results of significant diagnostics from this hospitalization (including imaging, microbiology, ancillary and laboratory)  are listed below for reference.   Imaging Studies: DG Chest Port 1 View Result Date: 08/10/2023 CLINICAL DATA:  Questionable sepsis - evaluate for abnormality EXAM: PORTABLE CHEST - 1 VIEW COMPARISON:  07/29/2023. FINDINGS: Cardiac silhouette unremarkable. Mild pulmonary vascular congestion. Left base alveolar consolidation may represent pneumonia. No pneumothorax or pleural effusion. IMPRESSION: Left base consolidation. Electronically Signed   By: Sydell Eva M.D.   On: 08/10/2023 22:01   DG Chest 2 View Result Date: 07/29/2023 CLINICAL DATA:  Left-sided chest pain. EXAM: CHEST - 2 VIEW COMPARISON:  01/29/2022. FINDINGS: The heart size and mediastinal contours are within normal limits. No consolidation, effusion, or pneumothorax. No acute osseous abnormality is seen. IMPRESSION: No active cardiopulmonary disease. Electronically Signed   By: Wyvonnia Heimlich M.D.   On: 07/29/2023 22:16    Microbiology: Results for orders placed or performed during the hospital encounter of 08/10/23  Resp panel by RT-PCR (RSV, Flu A&B, Covid) Anterior Nasal Swab     Status: None   Collection Time: 08/10/23  7:47 PM   Specimen: Anterior Nasal Swab  Result Value Ref Range Status   SARS Coronavirus 2 by RT PCR NEGATIVE NEGATIVE Final   Influenza A by PCR NEGATIVE NEGATIVE Final   Influenza B by PCR NEGATIVE NEGATIVE Final    Comment: (NOTE) The Xpert Xpress SARS-CoV-2/FLU/RSV plus assay is intended as an aid in the diagnosis of influenza from Nasopharyngeal swab specimens and should not be used as a sole basis for treatment. Nasal washings and aspirates are unacceptable for Xpert Xpress SARS-CoV-2/FLU/RSV testing.  Fact Sheet for Patients: BloggerCourse.com  Fact Sheet for Healthcare Providers: SeriousBroker.it  This test is not yet approved or cleared by the [place]  FDA and has been authorized for detection and/or diagnosis of SARS-CoV-2 by FDA  under an Emergency Use Authorization (EUA). This EUA will remain in effect (meaning this test can be used) for the duration of the COVID-19 declaration under Section 564(b)(1) of the Act, 21 U.S.C. section 360bbb-3(b)(1), unless the authorization is terminated or revoked.     Resp Syncytial Virus by PCR NEGATIVE NEGATIVE Final    Comment: (NOTE) Fact Sheet for Patients: BloggerCourse.com  Fact Sheet for Healthcare Providers: SeriousBroker.it  This test is not yet approved or cleared by the [place]  FDA and has been authorized for detection and/or diagnosis of SARS-CoV-2 by FDA under an Emergency Use Authorization (EUA). This EUA will remain in effect (meaning this test can be used) for the duration of the COVID-19 declaration under Section 564(b)(1) of the Act, 21 U.S.C. section 360bbb-3(b)(1), unless the authorization is terminated or revoked.  Performed at [facility], [address] [place], [place] [address]   Blood Culture (routine x 2)     Status: None (Preliminary result)   Collection Time: 08/10/23  7:52 PM   Specimen: BLOOD  Result Value Ref Range Status   Specimen Description BLOOD RIGHT ANTECUBITAL  Final   Special Requests   Final    BOTTLES DRAWN AEROBIC AND ANAEROBIC Blood Culture results may not be optimal due to an inadequate volume of blood received in culture bottles   Culture   Final    NO GROWTH 4 DAYS Performed at [facility], [address], [place], [place] [address]    Report Status PENDING  Incomplete  Blood Culture (routine x 2)     Status: None (Preliminary result)   Collection Time: 08/10/23  8:15 PM   Specimen: BLOOD RIGHT ARM  Result Value Ref Range Status   Specimen Description BLOOD RIGHT ARM  Final   Special Requests   Final    BOTTLES DRAWN AEROBIC AND ANAEROBIC Blood Culture results may not be optimal due to an inadequate volume of blood received in culture bottles    Culture   Final    NO GROWTH 4 DAYS Performed at [facility], [address], [place], [place] [address]    Report Status PENDING  Incomplete   Labs: CBC: Recent Labs  Lab 08/10/23 1947 08/11/23 0445 08/11/23 0504 08/12/23 0335 08/13/23 0619  WBC 5.6 5.2  --  3.9* 4.8  NEUTROABS 4.3 3.8  --   --   --   HGB 11.6* 11.5* 11.6* 10.5* 10.3*  HCT 37.3 36.3 34.0* 33.6* 33.3*  MCV 80.7 79.8*  --  79.8* 80.2  PLT 217 207  --  196 221  Basic Metabolic Panel: Recent Labs  Lab 08/10/23 1947 08/10/23 2014 08/11/23 0445 08/11/23 0504 08/12/23 0335 08/13/23 0619  NA 136  --  137 137 140 138  K 3.4*  --  3.3* 3.2* 3.7 3.8  CL 101  --  101  --  103 105  CO2 24  --  26  --  27 26  GLUCOSE 104*  --  103*  --  86 99  BUN 9  --  8  --  7 8  CREATININE 1.13*  --  1.17*  --  0.95 0.87  CALCIUM  8.0*  --  7.9*  --  7.9* 7.9*  MG  --  1.6* 2.3  --   --   --   PHOS  --   --  2.2*  --  4.5  --    Liver Function Tests: Recent Labs  Lab 08/10/23 1947 08/11/23 0445 08/12/23 0335  AST 22 22  --   ALT 17 15  --   ALKPHOS 85 82  --   BILITOT 0.5 0.2  --   PROT 5.4* 5.2*  --   ALBUMIN 3.1* 3.0* 2.7*   CBG: Recent Labs  Lab 08/13/23 1210 08/13/23 1737 08/13/23 2217 08/14/23 0919 08/14/23 1237  GLUCAP 107* 76 119* 144* 118*    Discharge time spent: greater than 30 minutes.  Author: Charlean Congress, MD  [facility] 08/14/2023

## 2023-08-14 NOTE — Progress Notes (Signed)
 SATURATION QUALIFICATIONS: (This note is used to comply with regulatory documentation for home oxygen)  Patient Saturations on Room Air at Rest = 95%  Patient Saturations on Room Air while Ambulating = 94%  Patient Saturations on 0 Liters of oxygen while Ambulating = 0%  Please briefly explain why patient needs home oxygen: Patient did not need supplemental oxygen while ambulating.

## 2023-08-14 NOTE — Plan of Care (Signed)
  Problem: Coping: Goal: Ability to adjust to condition or change in health will improve Outcome: Progressing   Problem: Health Behavior/Discharge Planning: Goal: Ability to identify and utilize available resources and services will improve Outcome: Progressing Goal: Ability to manage health-related needs will improve Outcome: Progressing   Problem: Education: Goal: Knowledge of General Education information will improve Description: Including pain rating scale, medication(s)/side effects and non-pharmacologic comfort measures Outcome: Progressing   Problem: Health Behavior/Discharge Planning: Goal: Ability to manage health-related needs will improve Outcome: Progressing

## 2023-08-14 NOTE — Plan of Care (Signed)

## 2023-08-14 NOTE — Progress Notes (Signed)
   08/14/23 0910  Assess: MEWS Score  Temp 97.8 F (36.6 C)  BP (!) 148/101  MAP (mmHg) 112  Pulse Rate (!) 120  Resp 16  Level of Consciousness Alert  SpO2 100 %  O2 Device Nasal Cannula  O2 Flow Rate (L/min) 2 L/min  Assess: MEWS Score  MEWS Temp 0  MEWS Systolic 0  MEWS Pulse 2  MEWS RR 0  MEWS LOC 0  MEWS Score 2  MEWS Score Color Yellow  Assess: if the MEWS score is Yellow or Red  Were vital signs accurate and taken at a resting state? No, vital signs rechecked  Does the patient meet 2 or more of the SIRS criteria? No  MEWS guidelines implemented  Yes, yellow  Treat  MEWS Interventions Considered administering scheduled or prn medications/treatments as ordered  Take Vital Signs  Increase Vital Sign Frequency  Yellow: Q2hr x1, continue Q4hrs until patient remains green for 12hrs  Escalate  MEWS: Escalate Yellow: Discuss with charge nurse and consider notifying provider and/or RRT  Notify: Charge Nurse/RN  Name of Charge Nurse/RN Notified Apolinar Baxter, RN  Provider Notification  Provider Name/Title Charlean Congress, MD  Date Provider Notified 08/14/23  Time Provider Notified [phone]  Method of Notification Page  Notification Reason Other (Comment) (Patient changed to yellow MEWS)  Provider response No new orders  Assess: SIRS CRITERIA  SIRS Temperature  0  SIRS Respirations  0  SIRS Pulse 1  SIRS WBC 0  SIRS Score Sum  1

## 2023-08-15 LAB — CULTURE, BLOOD (ROUTINE X 2)
Culture: NO GROWTH
Culture: NO GROWTH

## 2023-11-12 ENCOUNTER — Other Ambulatory Visit (HOSPITAL_COMMUNITY): Payer: Self-pay
